# Patient Record
Sex: Female | Born: 1995 | Hispanic: Yes | Marital: Single | Smoking: Never smoker
Health system: Southern US, Community
[De-identification: ages and names within clinical notes are randomized; demographics above are authoritative.]

## PROBLEM LIST (undated history)

## (undated) ENCOUNTER — Inpatient Hospital Stay (HOSPITAL_COMMUNITY): Payer: Self-pay

## (undated) DIAGNOSIS — Z3490 Encounter for supervision of normal pregnancy, unspecified, unspecified trimester: Secondary | ICD-10-CM

## (undated) DIAGNOSIS — B009 Herpesviral infection, unspecified: Secondary | ICD-10-CM

## (undated) DIAGNOSIS — Z639 Problem related to primary support group, unspecified: Secondary | ICD-10-CM

## (undated) DIAGNOSIS — K219 Gastro-esophageal reflux disease without esophagitis: Secondary | ICD-10-CM

## (undated) DIAGNOSIS — J45909 Unspecified asthma, uncomplicated: Secondary | ICD-10-CM

## (undated) DIAGNOSIS — G4730 Sleep apnea, unspecified: Secondary | ICD-10-CM

## (undated) DIAGNOSIS — L309 Dermatitis, unspecified: Secondary | ICD-10-CM

## (undated) DIAGNOSIS — O99820 Streptococcus B carrier state complicating pregnancy: Secondary | ICD-10-CM

## (undated) DIAGNOSIS — G43909 Migraine, unspecified, not intractable, without status migrainosus: Secondary | ICD-10-CM

## (undated) DIAGNOSIS — R634 Abnormal weight loss: Secondary | ICD-10-CM

## (undated) DIAGNOSIS — E079 Disorder of thyroid, unspecified: Secondary | ICD-10-CM

## (undated) DIAGNOSIS — D649 Anemia, unspecified: Secondary | ICD-10-CM

## (undated) DIAGNOSIS — N920 Excessive and frequent menstruation with regular cycle: Secondary | ICD-10-CM

## (undated) DIAGNOSIS — F419 Anxiety disorder, unspecified: Secondary | ICD-10-CM

## (undated) DIAGNOSIS — R55 Syncope and collapse: Secondary | ICD-10-CM

## (undated) DIAGNOSIS — Z8619 Personal history of other infectious and parasitic diseases: Secondary | ICD-10-CM

## (undated) DIAGNOSIS — M545 Low back pain: Secondary | ICD-10-CM

## (undated) DIAGNOSIS — F1910 Other psychoactive substance abuse, uncomplicated: Secondary | ICD-10-CM

## (undated) DIAGNOSIS — F4323 Adjustment disorder with mixed anxiety and depressed mood: Secondary | ICD-10-CM

## (undated) HISTORY — DX: Anxiety disorder, unspecified: F41.9

## (undated) HISTORY — PX: TOOTH EXTRACTION: SUR596

---
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
## 2016-01-11 ENCOUNTER — Ambulatory Visit (INDEPENDENT_AMBULATORY_CARE_PROVIDER_SITE_OTHER): Payer: Medicaid Other | Admitting: Obstetrics & Gynecology

## 2016-01-11 ENCOUNTER — Encounter: Payer: Self-pay | Admitting: Obstetrics & Gynecology

## 2016-01-11 DIAGNOSIS — Z3009 Encounter for other general counseling and advice on contraception: Secondary | ICD-10-CM

## 2016-01-11 DIAGNOSIS — Z30011 Encounter for initial prescription of contraceptive pills: Secondary | ICD-10-CM

## 2016-01-11 MED ORDER — NORGESTIMATE-ETH ESTRADIOL 0.25-35 MG-MCG PO TABS: 1.0000 | ORAL_TABLET | Freq: Every day | ORAL | Status: DC

## 2016-01-11 NOTE — Patient Instructions (Signed)
Oral Contraception Use Oral contraceptive pills (OCPs) are medicines taken to prevent pregnancy. OCPs work by preventing the ovaries from releasing eggs. The hormones in OCPs also cause the cervical mucus to thicken, preventing the sperm from entering the uterus. The hormones also cause the uterine lining to become thin, not allowing a fertilized egg to attach to the inside of the uterus. OCPs are highly effective when taken exactly as prescribed. However, OCPs do not prevent sexually transmitted diseases (STDs). Safe sex practices, such as using condoms along with an OCP, can help prevent STDs. Before taking OCPs, you may have a physical exam and Pap test. Your health care provider may also order blood tests if necessary. Your health care provider will make sure you are a good candidate for oral contraception. Discuss with your health care provider the possible side effects of the OCP you may be prescribed. When starting an OCP, it can take 2 to 3 months for the body to adjust to the changes in hormone levels in your body.  HOW TO TAKE ORAL CONTRACEPTIVE PILLS Your health care provider may advise you on how to start taking the first cycle of OCPs. Otherwise, you can:   Start on day 1 of your menstrual period. You will not need any backup contraceptive protection with this start time.   Start on the first Sunday after your menstrual period or the day you get your prescription. In these cases, you will need to use backup contraceptive protection for the first week.   Start the pill at any time of your cycle. If you take the pill within 5 days of the start of your period, you are protected against pregnancy right away. In this case, you will not need a backup form of birth control. If you start at any other time of your menstrual cycle, you will need to use another form of birth control for 7 days. If your OCP is the type called a minipill, it will protect you from pregnancy after taking it for 2 days (48  hours). After you have started taking OCPs:   If you forget to take 1 pill, take it as soon as you remember. Take the next pill at the regular time.   If you miss 2 or more pills, call your health care provider because different pills have different instructions for missed doses. Use backup birth control until your next menstrual period starts.   If you use a 28-day pack that contains inactive pills and you miss 1 of the last 7 pills (pills with no hormones), it will not matter. Throw away the rest of the non-hormone pills and start a new pill pack.  No matter which day you start the OCP, you will always start a new pack on that same day of the week. Have an extra pack of OCPs and a backup contraceptive method available in case you miss some pills or lose your OCP pack.  HOME CARE INSTRUCTIONS   Do not smoke.   Always use a condom to protect against STDs. OCPs do not protect against STDs.   Use a calendar to mark your menstrual period days.   Read the information and directions that came with your OCP. Talk to your health care provider if you have questions.  SEEK MEDICAL CARE IF:   You develop nausea and vomiting.   You have abnormal vaginal discharge or bleeding.   You develop a rash.   You miss your menstrual period.   You are losing   your hair.   You need treatment for mood swings or depression.   You get dizzy when taking the OCP.   You develop acne from taking the OCP.   You become pregnant.  SEEK IMMEDIATE MEDICAL CARE IF:   You develop chest pain.   You develop shortness of breath.   You have an uncontrolled or severe headache.   You develop numbness or slurred speech.   You develop visual problems.   You develop pain, redness, and swelling in the legs.    This information is not intended to replace advice given to you by your health care provider. Make sure you discuss any questions you have with your health care provider.   Document  Released: 11/28/2011 Document Revised: 12/30/2014 Document Reviewed: 05/30/2013 Elsevier Interactive Patient Education 2016 [employer] Inc[employer]  

## 2016-01-11 NOTE — Progress Notes (Deleted)
Patient ID: Angela Branch, female   DOB: 13-Feb-1996, 20 y.o.   MRN: [id number]

## 2016-01-11 NOTE — Progress Notes (Signed)
Patient ID: Angela Branch, female   DOB: 01-12-1996, 20 y.o.   MRN: [id number] Subjective:     Angela Branch is a 20 y.o. female who presents for a postpartum visit. She is 6 weeks postpartum following a spontaneous vaginal delivery. I have fully reviewed the prenatal and intrapartum course. The delivery was at 40 4/7 gestational weeks. Outcome: spontaneous vaginal delivery. Anesthesia: epidural. Postpartum course has been uncomplicated. Baby's course has been uncomplicated. Baby is feeding by bottle. Bleeding no bleeding. Bowel function is normal. Bladder function is ok.  Pt reorts that it takes a while to begin her stream.. Patient is sexually active. Contraception method is none. Postpartum depression screening: negative.  The following portions of the patient's history were reviewed and updated as appropriate: allergies, current medications, past family history, past medical history, past social history, past surgical history and problem list.  Review of Systems Pertinent items are noted in HPI.   Objective:    BP 101/53 mmHg  Pulse 72  Temp(Src) 97.9 F (36.6 C)  Wt 128 lb 6.4 oz (58.242 kg)  Pt in NAD GU: EGBUS: no lesions; perineum well healed Vagina: no blood in vault Cervix: no lesion; no mucopurulent d/c Uterus: small, mobile Adnexa: no masses; sl tender     Assessment:     6 week postpartum exam. Pap smear not done at today's visit.   Plan:    1. Contraception: OCP (estrogen/progesterone). Sprintec 1 po q day 2. Pt told to be abstinate for 2 weeks and to do a UPT if neg to begin OCPs 3. Follow up in: 1 year or as needed.    Zykerria Tanton L. Harraway-Smith, M.D., Cherlynn June

## 2016-02-26 ENCOUNTER — Encounter (HOSPITAL_COMMUNITY): Payer: Self-pay | Admitting: Family Medicine

## 2016-02-26 ENCOUNTER — Emergency Department (HOSPITAL_COMMUNITY)
Admission: EM | Admit: 2016-02-26 | Discharge: 2016-02-26 | Disposition: A | Discharge status: Home / Self Care | Payer: Medicaid Other | Attending: Physician Assistant | Admitting: Physician Assistant

## 2016-02-26 DIAGNOSIS — Z793 Long term (current) use of hormonal contraceptives: Secondary | ICD-10-CM | POA: Insufficient documentation

## 2016-02-26 DIAGNOSIS — Z862 Personal history of diseases of the blood and blood-forming organs and certain disorders involving the immune mechanism: Secondary | ICD-10-CM | POA: Diagnosis not present

## 2016-02-26 DIAGNOSIS — Z79899 Other long term (current) drug therapy: Secondary | ICD-10-CM | POA: Insufficient documentation

## 2016-02-26 DIAGNOSIS — J45909 Unspecified asthma, uncomplicated: Secondary | ICD-10-CM | POA: Diagnosis not present

## 2016-02-26 DIAGNOSIS — Z8659 Personal history of other mental and behavioral disorders: Secondary | ICD-10-CM | POA: Diagnosis not present

## 2016-02-26 DIAGNOSIS — N390 Urinary tract infection, site not specified: Secondary | ICD-10-CM

## 2016-02-26 DIAGNOSIS — R319 Hematuria, unspecified: Secondary | ICD-10-CM | POA: Insufficient documentation

## 2016-02-26 DIAGNOSIS — N739 Female pelvic inflammatory disease, unspecified: Secondary | ICD-10-CM | POA: Diagnosis not present

## 2016-02-26 DIAGNOSIS — R3915 Urgency of urination: Secondary | ICD-10-CM | POA: Diagnosis present

## 2016-02-26 LAB — URINE MICROSCOPIC-ADD ON

## 2016-02-26 LAB — URINALYSIS, ROUTINE W REFLEX MICROSCOPIC
Bilirubin Urine: NEGATIVE
GLUCOSE, UA: NEGATIVE mg/dL
KETONES UR: NEGATIVE mg/dL
Nitrite: NEGATIVE
PH: 6 (ref 5.0–8.0)
Protein, ur: NEGATIVE mg/dL
Specific Gravity, Urine: 1.008 (ref 1.005–1.030)

## 2016-02-26 LAB — WET PREP, GENITAL
SPERM: NONE SEEN
TRICH WET PREP: NONE SEEN
Yeast Wet Prep HPF POC: NONE SEEN

## 2016-02-26 LAB — POC URINE PREG, ED: Preg Test, Ur: NEGATIVE

## 2016-02-26 MED ORDER — DOXYCYCLINE HYCLATE 100 MG PO CAPS: 100.0000 mg | ORAL_CAPSULE | Freq: Two times a day (BID) | ORAL | Status: DC

## 2016-02-26 MED ORDER — STERILE WATER FOR INJECTION IJ SOLN
10.0000 mL | Freq: Once | INTRAMUSCULAR | Status: AC
  Administered 2016-02-26: 10 mL via INTRAMUSCULAR
  Filled 2016-02-26: qty 10

## 2016-02-26 MED ORDER — CEFTRIAXONE SODIUM 250 MG IJ SOLR
250.0000 mg | Freq: Once | INTRAMUSCULAR | Status: AC
  Administered 2016-02-26: 250 mg via INTRAMUSCULAR
  Filled 2016-02-26: qty 250

## 2016-02-26 MED ORDER — METRONIDAZOLE 500 MG PO TABS: 500.0000 mg | ORAL_TABLET | Freq: Two times a day (BID) | ORAL | Status: DC

## 2016-02-26 NOTE — ED Notes (Signed)
Pt here for burning with urination. sts some lower abd pain. sts LMP 2 days ago.

## 2016-02-26 NOTE — Discharge Instructions (Signed)
Take medications as prescribed. Return to the emergency room for worsening condition or new concerning symptoms. Follow up with your regular doctor. If you don't have a regular doctor use one of the numbers below to establish a primary care doctor.   Emergency Department Resource Guide 1) Find a Doctor and Pay Out of Pocket Although you won't have to find out who is covered by your insurance plan, it is a good idea to ask around and get recommendations. You will then need to call the office and see if the doctor you have chosen will accept you as a new patient and what types of options they offer for patients who are self-pay. Some doctors offer discounts or will set up payment plans for their patients who do not have insurance, but you will need to ask so you aren't surprised when you get to your appointment.  2) Contact Your Local Health Department Not all health departments have doctors that can see patients for sick visits, but many do, so it is worth a call to see if yours does. If you don't know where your local health department is, you can check in your phone book. The CDC also has a tool to help you locate your state's health department, and many state websites also have listings of all of their local health departments.  3) Find a [facility] If your illness is not likely to be very severe or complicated, you may want to try a walk in clinic. These are popping up all over the country in pharmacies, drugstores, and shopping centers. They're usually staffed by nurse practitioners or physician assistants that have been trained to treat common illnesses and complaints. They're usually fairly quick and inexpensive. However, if you have serious medical issues or chronic medical problems, these are probably not your best option.  No Primary Care Doctor: - Call Health Connect at  [phone] - they can help you locate a primary care doctor that  accepts your insurance, provides certain services,  etc. - Physician Referral [phone]  Emergency Department Resource Guide 1) Find a Doctor and Pay Out of Pocket Although you won't have to find out who is covered by your insurance plan, it is a good idea to ask around and get recommendations. You will then need to call the office and see if the doctor you have chosen will accept you as a new patient and what types of options they offer for patients who are self-pay. Some doctors offer discounts or will set up payment plans for their patients who do not have insurance, but you will need to ask so you aren't surprised when you get to your appointment.  2) Contact Your Local Health Department Not all health departments have doctors that can see patients for sick visits, but many do, so it is worth a call to see if yours does. If you don't know where your local health department is, you can check in your phone book. The CDC also has a tool to help you locate your state's health department, and many state websites also have listings of all of their local health departments.  3) Find a [facility] If your illness is not likely to be very severe or complicated, you may want to try a walk in clinic. These are popping up all over the country in pharmacies, drugstores, and shopping centers. They're usually staffed by nurse practitioners or physician assistants that have been trained to treat common illnesses and complaints. They're usually fairly  quick and inexpensive. However, if you have serious medical issues or chronic medical problems, these are probably not your best option.  No Primary Care Doctor: - Call Health Connect at  [phone] - they can help you locate a primary care doctor that  accepts your insurance, provides certain services, etc. - Physician Referral Service- [phone]  Chronic Pain Problems: Organization         Address  Phone   Notes  [facility]  [phone] Patients need to be referred by  their primary care doctor.   Medication Assistance: Organization         Address  Phone   Notes  [facility] Medication [facility] [address] [place], [place] [address] [phone] --Must be a resident of [facility] -- Must have NO insurance coverage whatsoever (no Medicaid/ Medicare, etc.) -- The pt. MUST have a primary care doctor that directs their care regularly and follows them in the community   [facility]  [phone]   [employer]  [phone]    Agencies that provide inexpensive medical care: Organization         Address  Phone   Notes  [facility]  [phone]   [facility]    [phone]   [facility] [place], [place] [address] [phone]   [place] [address], [place] [phone]   [facility]    [phone]   [facility]    [phone]   [place] and [place] [address], [place] Phone:  [phone], Fax:  [phone] Hours of Operation:  9 am - 6 pm, M-F.  Also accepts Medicaid/Medicare and self-pay.  [facility] for [place] [address], [address], [place] Phone: [phone], Fax: [phone]. Hours of Operation:  8:30 am - 5:30 pm, M-F.  Also accepts Medicaid and self-pay.  [facility] [address] [place] Phone: [phone]   [place], [place], [place] [phone], Ext. [phone] Mondays & Thursdays: 7-9 AM.  First 15 patients are seen on a first come, first serve basis.    [place] Providers:  Organization         Address  Phone   Notes  [facility] [address],  [phone] Also accepts self-pay patients.  [facility] [address], [place]  [phone]   [place], [address], [place] [phone]   [facility] [address] [place] [phone]   Lucianne Lei [address], [place]   [phone] Only accepts [place] patients after they have their name applied to their card.   Self-Pay (no insurance) in [facility]:  Organization         Address  Phone   Notes  Sickle Cell Patients, [facility] [place] [phone]   [facility] [place] [phone]   [facility] [place]  [address], [address], [place] [phone]   [facility]/Dr. Osei-Bonsu  [address], [place] or [place], [address], [place] [phone] Phone number  for both [employer] and [place] locations is the same.  Urgent Medical and [facility] [address], [place] [phone]   [facility] [address] [place] or [address] [phone]   [facility] [address], [place] [phone], phone; [phone], fax Sees patients 1st and 3rd Saturday of every month.  Must not qualify for public or private insurance (i.e. Medicaid, Medicare, [place] [employer] Choice, Veterans' Benefits)  Household income should be no more than 200% of the poverty level The clinic cannot treat you if you are pregnant or think you are pregnant  Sexually transmitted diseases are not treated at the clinic.     Pelvic Inflammatory Disease Pelvic inflammatory disease (PID) refers to an infection in some or all of the female organs. The infection can be in the uterus, ovaries, fallopian tubes, or the surrounding tissues in the pelvis. PID can cause abdominal or pelvic pain that comes on suddenly (acute pelvic pain). PID is a serious infection because it can lead to lasting (chronic) pelvic pain or the inability  to have children (infertility). CAUSES This condition is most often caused by an infection that is spread during sexual contact. However, the infection can also be caused by the normal bacteria that are found in the vaginal tissues if these bacteria travel upward into the reproductive organs. PID can also occur following:  The birth of a baby.  A miscarriage.  An abortion.  Major pelvic surgery.  The use of an intrauterine device (IUD).  A sexual assault. RISK FACTORS This condition is more likely to develop in women who:  Are younger than 19 years of age.  Are sexually active at [facility] age.  Use nonbarrier contraception.  Have multiple sexual partners.  Have sex with someone who has symptoms of an STD (sexually transmitted disease).  Use oral contraception. At times, certain behaviors can also increase the possibility of getting PID, such as:  Using a vaginal douche.  Having an IUD in place. SYMPTOMS Symptoms of this condition include:  Abdominal or pelvic pain.  Fever.  Chills.  Abnormal vaginal discharge.  Abnormal uterine bleeding.  Unusual pain shortly after the end of a menstrual period.  Painful urination.  Pain with sexual intercourse.  Nausea and vomiting. DIAGNOSIS To diagnose this condition, your health care provider will do a physical exam and take your medical history. A pelvic exam typically reveals great tenderness in the uterus and the surrounding pelvic tissues. You may also have tests, such as:  Lab tests, including a pregnancy test, blood tests, and urine test.  Culture tests of the vagina and cervix to check for an STD.  Ultrasound.  A laparoscopic procedure to look inside the pelvis.  Examining vaginal secretions under a microscope. TREATMENT Treatment for this condition may involve one or more approaches.  Antibiotic medicines may be prescribed to be taken by mouth.  Sexual partners may need to be treated if the infection  is caused by an STD.  For more severe cases, hospitalization may be needed to give antibiotics directly into a vein through an IV tube.  Surgery may be needed if other treatments do not help, but this is rare. It may take weeks until you are completely well. If you are diagnosed with PID, you should also be checked for human immunodeficiency virus (HIV). Your health care provider may test you for infection again 3 months after treatment. You should not have unprotected sex.  HOME CARE INSTRUCTIONS  Take over-the-counter and prescription medicines only as told by your health care provider.  If you were prescribed an antibiotic medicine, take it as told by your health care provider. Do not stop taking the antibiotic even if you start to feel better.  Do not have sexual intercourse until treatment is completed or as told by your health care provider. If PID is confirmed, your recent sexual partners will need treatment, especially if you had unprotected sex.  Keep all follow-up visits as told by your health care provider. This is important. SEEK MEDICAL CARE IF:  You have increased or abnormal vaginal discharge.  Your pain does not improve.  You vomit.  You have a fever.  You cannot tolerate your medicines.  Your partner has an STD.  You have pain when you urinate. SEEK IMMEDIATE MEDICAL CARE IF:  You have increased abdominal or pelvic pain.  You have chills.  Your symptoms are not better in 72 hours even with treatment.   This information is not intended to replace advice given to you by your health care provider. Make sure you discuss any questions you have with your health care provider.   Document Released: 12/09/2005 Document Revised: 08/30/2015 Document Reviewed: 01/16/2015 Elsevier Interactive Patient Education [employer].

## 2016-02-26 NOTE — ED Provider Notes (Signed)
CSN: [id number]     Arrival date & time 02/26/16  1544 History  By signing my name below, I, Dora Sims, attest that this documentation has been prepared under the direction and in the presence of non-physician practitioner, Delrae Rend, PA-C. Electronically Signed: Dora Sims, Scribe. 02/26/2016. 5:01 PM.    Chief Complaint  Patient presents with  . Urinary Urgency    The history is provided by the patient. No language interpreter was used.     HPI Comments: Angela Branch is a 20 y.o. female with h/o UTI who presents to the Emergency Department complaining of constant dysuria onset a couple of days ago. She reports experiencing dysuria since she had her last period two days ago; her most recent period is her first since giving birth three months ago. Pt endorses associated lower abdominal pain and urinary hesitancy since onset. She also notes experiencing yellow vaginal discharge after urinating for the past couple of days; she notes that she has never had yellow vaginal discharge in the past. She denies increased urinary frequency, urgency, nausea, vomiting, diarrhea, fever, chills, or any other associated symptoms.    Past Medical History  Diagnosis Date  . Asthma   . Anxiety   . Anemia    Past Surgical History  Procedure Laterality Date  . Tooth extraction     Family History  Problem Relation Age of Onset  . Asthma Father   . Diabetes Maternal Grandmother   . Hyperlipidemia Paternal Grandfather   . Heart disease Paternal Grandfather   . Mental illness Neg Hx   . Birth defects Neg Hx   . Kidney disease Neg Hx   . Hypertension Neg Hx   . Cancer - Colon Maternal Grandfather    Social History  Substance Use Topics  . Smoking status: Never Smoker   . Smokeless tobacco: Never Used  . Alcohol Use: No   OB History    Gravida Para Term Preterm AB TAB SAB Ectopic Multiple Living   '2 1 1 '$ 0 1 0 1 0 0 1     Review of Systems  Constitutional: Negative for fever and  chills.  Gastrointestinal: Positive for abdominal pain. Negative for nausea, vomiting and diarrhea.  Genitourinary: Positive for dysuria and vaginal discharge. Negative for urgency and frequency.  All other systems reviewed and are negative.     Allergies  Review of patient's allergies indicates no known allergies.  Home Medications   Prior to Admission medications   Medication Sig Start Date End Date Taking? Authorizing Provider  albuterol (PROVENTIL HFA;VENTOLIN HFA) 108 (90 BASE) MCG/ACT inhaler Inhale 2 puffs into the lungs every 6 (six) hours as needed for wheezing or shortness of breath. 07/20/15   Myrtis Ser, CNM  ibuprofen (ADVIL,MOTRIN) 600 MG tablet Take 1 tablet (600 mg total) by mouth every 6 (six) hours as needed. 12/02/15   Myrtis Ser, CNM  norgestimate-ethinyl estradiol (ORTHO-CYCLEN,SPRINTEC,PREVIFEM) 0.25-35 MG-MCG tablet Take 1 tablet by mouth daily. 01/11/16   Lavonia Drafts, MD  permethrin (ELIMITE) 5 % cream Apply 1 application topically once. Reported on 01/11/2016 11/08/15   Historical Provider, MD  Prenatal Vit-Fe Fumarate-FA (PRENATAL MULTIVITAMIN) TABS tablet Take 1 tablet by mouth daily at 12 noon.    Historical Provider, MD   BP 97/62 mmHg  Pulse 72  Temp(Src) 97.9 F (36.6 C) (Oral)  Resp 18  SpO2 100%  LMP 02/23/2016 Physical Exam  Constitutional: She is oriented to person, place, and time. She appears well-developed and well-nourished.  No distress.  HENT:  Head: Normocephalic and atraumatic.  Eyes: Conjunctivae and EOM are normal.  Neck: Neck supple. No tracheal deviation present.  Cardiovascular: Normal rate.   Pulmonary/Chest: Effort normal. No respiratory distress.  Abdominal: Soft. Bowel sounds are normal. She exhibits no distension. There is no tenderness.  Genitourinary:  Copious yellow-white smooth discharge in vault. +CMT. No adnexal tenderness. Tenderness along left side of vaginal canal. No lesions or bleeding.   Musculoskeletal: Normal range of motion.  Neurological: She is alert and oriented to person, place, and time.  Skin: Skin is warm and dry.  Psychiatric: She has a normal mood and affect. Her behavior is normal.  Nursing note and vitals reviewed.   ED Course  Procedures (including critical care time)  DIAGNOSTIC STUDIES: Oxygen Saturation is 100% on RA, normal by my interpretation.    COORDINATION OF CARE: 5:01 PM Will order HIV antibody, RPR, and genital wet prep. Discussed treatment plan with pt at bedside and pt agreed to plan.   Labs Review Labs Reviewed  WET PREP, GENITAL - Abnormal; Notable for the following:    Clue Cells Wet Prep HPF POC PRESENT (*)    WBC, Wet Prep HPF POC MANY (*)    All other components within normal limits  URINALYSIS, ROUTINE W REFLEX MICROSCOPIC (NOT AT [facility]) - Abnormal; Notable for the following:    APPearance HAZY (*)    Hgb urine dipstick TRACE (*)    Leukocytes, UA LARGE (*)    All other components within normal limits  URINE MICROSCOPIC-ADD ON - Abnormal; Notable for the following:    Squamous Epithelial / LPF 0-5 (*)    Bacteria, UA FEW (*)    All other components within normal limits  URINE CULTURE  HIV ANTIBODY (ROUTINE TESTING)  RPR  POC URINE PREG, ED  GC/CHLAMYDIA PROBE AMP () NOT AT [facility]    Imaging Review No results found. I have personally reviewed and evaluated these lab results as part of my medical decision-making.   EKG Interpretation None      MDM   Final diagnoses:  PID (pelvic inflammatory disease)  Urinary tract infection with hematuria, site unspecified    Given discharge, cervical motion tenderness, and wet prep with WBC and clue cells, will treat as PID. Rocephin given in the ED. Rx given for doxy and flagyl. UA shows evidence of infection as well. Sent for culture. Encouraged safe sex practices. Encouraged abstinence until tx completed and informing all sexual partners to be treated/tested as  well. Resource guide given to establish PCP for f/u. ER return precautions given.  I personally performed the services described in this documentation, which was scribed in my presence. The recorded information has been reviewed and is accurate.     Anne Ng, PA-C 02/26/16 2015  [place], MD 02/27/16 1623

## 2016-02-27 LAB — GC/CHLAMYDIA PROBE AMP (~~LOC~~) NOT AT ARMC
Chlamydia: NEGATIVE
Neisseria Gonorrhea: NEGATIVE

## 2016-02-27 LAB — HIV ANTIBODY (ROUTINE TESTING W REFLEX): HIV Screen 4th Generation wRfx: NONREACTIVE

## 2016-02-27 LAB — RPR: RPR Ser Ql: NONREACTIVE

## 2016-02-28 LAB — URINE CULTURE

## 2016-04-11 ENCOUNTER — Other Ambulatory Visit: Payer: Self-pay | Admitting: Pediatrics

## 2016-04-11 MED ORDER — ALBUTEROL SULFATE HFA 108 (90 BASE) MCG/ACT IN AERS: 2.0000 | INHALATION_SPRAY | Freq: Four times a day (QID) | RESPIRATORY_TRACT | Status: DC | PRN

## 2016-04-29 ENCOUNTER — Encounter: Payer: Self-pay | Admitting: Pediatrics

## 2016-04-29 ENCOUNTER — Ambulatory Visit (INDEPENDENT_AMBULATORY_CARE_PROVIDER_SITE_OTHER): Payer: Medicaid Other | Admitting: Pediatrics

## 2016-04-29 VITALS — BP 103/71 | HR 80 | Ht 63.39 in | Wt 125.0 lb

## 2016-04-29 DIAGNOSIS — Z113 Encounter for screening for infections with a predominantly sexual mode of transmission: Secondary | ICD-10-CM | POA: Diagnosis not present

## 2016-04-29 DIAGNOSIS — Z7251 High risk heterosexual behavior: Secondary | ICD-10-CM | POA: Diagnosis not present

## 2016-04-29 DIAGNOSIS — F4323 Adjustment disorder with mixed anxiety and depressed mood: Secondary | ICD-10-CM

## 2016-04-29 DIAGNOSIS — Z3045 Encounter for surveillance of transdermal patch hormonal contraceptive device: Secondary | ICD-10-CM | POA: Insufficient documentation

## 2016-04-29 DIAGNOSIS — N760 Acute vaginitis: Secondary | ICD-10-CM | POA: Diagnosis not present

## 2016-04-29 DIAGNOSIS — A499 Bacterial infection, unspecified: Secondary | ICD-10-CM

## 2016-04-29 DIAGNOSIS — Z3202 Encounter for pregnancy test, result negative: Secondary | ICD-10-CM | POA: Diagnosis not present

## 2016-04-29 DIAGNOSIS — N898 Other specified noninflammatory disorders of vagina: Secondary | ICD-10-CM

## 2016-04-29 DIAGNOSIS — B9689 Other specified bacterial agents as the cause of diseases classified elsewhere: Secondary | ICD-10-CM

## 2016-04-29 HISTORY — DX: Adjustment disorder with mixed anxiety and depressed mood: F43.23

## 2016-04-29 MED ORDER — ULIPRISTAL ACETATE 30 MG PO TABS
30.0000 mg | ORAL_TABLET | Freq: Once | ORAL | Status: AC
  Administered 2016-04-29: 30 mg via ORAL

## 2016-04-29 MED ORDER — NORELGESTROMIN-ETH ESTRADIOL 150-35 MCG/24HR TD PTWK: 1.0000 | MEDICATED_PATCH | TRANSDERMAL | Status: DC

## 2016-04-29 MED ORDER — FLUOXETINE HCL 20 MG PO CAPS: 20.0000 mg | ORAL_CAPSULE | Freq: Every day | ORAL | Status: DC

## 2016-04-29 NOTE — Progress Notes (Signed)
Pre-Visit Planning  Angela Branch  is a 20 y.o. female referred by [name], MD.   Last seen in [facility] on 03/28/15 for positive urine pregnancy test.   Plan at last visit included refer to OB. Patient since had a vaginal delivery and is 4 months postpartum. She was started on OCPs at 6 weeks PP and treated for PID 02/26/16 although testing was negative.  Date and Type of Previous Psych Screenings? No  Clinical Staff Visit Tasks:   - Urine GC/CT due? yes - HIV Screening due?  no - Psych Screenings Due? No - dirty urine for gc/chlamydia - urine pregnancy   Provider Visit Tasks: - discuss current concerns and needs  - discuss ongoing contraceptive management  - [facility] Involvement? No - Pertinent Labs? Yes  Results for orders placed or performed during the hospital encounter of 02/26/16  Wet prep, genital  Result Value Ref Range   Yeast Wet Prep HPF POC NONE SEEN NONE SEEN   Trich, Wet Prep NONE SEEN NONE SEEN   Clue Cells Wet Prep HPF POC PRESENT (A) NONE SEEN   WBC, Wet Prep HPF POC MANY (A) NONE SEEN   Sperm NONE SEEN   Urine culture  Result Value Ref Range   Specimen Description URINE, RANDOM    Special Requests ADDED 2159    Culture MULTIPLE SPECIES PRESENT, SUGGEST RECOLLECTION    Report Status 02/28/2016 FINAL   Urinalysis, Routine w reflex microscopic-may I&O cath if menses (not at [facility])  Result Value Ref Range   Color, Urine YELLOW YELLOW   APPearance HAZY (A) CLEAR   Specific Gravity, Urine 1.008 1.005 - 1.030   pH 6.0 5.0 - 8.0   Glucose, UA NEGATIVE NEGATIVE mg/dL   Hgb urine dipstick TRACE (A) NEGATIVE   Bilirubin Urine NEGATIVE NEGATIVE   Ketones, ur NEGATIVE NEGATIVE mg/dL   Protein, ur NEGATIVE NEGATIVE mg/dL   Nitrite NEGATIVE NEGATIVE   Leukocytes, UA LARGE (A) NEGATIVE  HIV antibody  Result Value Ref Range   HIV Screen 4th Generation wRfx Non Reactive Non Reactive  RPR  Result Value Ref Range   RPR Ser Ql Non Reactive Non  Reactive  Urine microscopic-add on  Result Value Ref Range   Squamous Epithelial / LPF 0-5 (A) NONE SEEN   WBC, UA 6-30 0 - 5 WBC/hpf   RBC / HPF 0-5 0 - 5 RBC/hpf   Bacteria, UA FEW (A) NONE SEEN  POC urine preg, ED (not at [facility])  Result Value Ref Range   Preg Test, Ur NEGATIVE NEGATIVE  GC/Chlamydia probe amp ([place])not at [facility]  Result Value Ref Range   Chlamydia Negative    Neisseria gonorrhea Negative      >5 minutes spent reviewing records and planning for patient's visit.

## 2016-04-29 NOTE — Progress Notes (Signed)
THIS RECORD MAY CONTAIN CONFIDENTIAL INFORMATION THAT SHOULD NOT BE RELEASED WITHOUT REVIEW OF THE SERVICE PROVIDER.  Adolescent Medicine Consultation Follow-Up Visit Angela Branch  is a 20 y.o. female referred by [name], MD here today for follow-up.    Previsit planning completed:  Yes   Expand All Collapse All   Pre-Visit Planning  Angela Branch is a 20 y.o. female referred by [name], MD.  Last seen in [facility] on 03/28/15 for positive urine pregnancy test.   Plan at last visit included refer to OB. Patient since had a vaginal delivery and is 4 months postpartum. She was started on OCPs at 6 weeks PP and treated for PID 02/26/16 although testing was negative.  Date and Type of Previous Psych Screenings? No  Clinical Staff Visit Tasks:  - Urine GC/CT due? yes - HIV Screening due? no - Psych Screenings Due? No - dirty urine for gc/chlamydia - urine pregnancy   Provider Visit Tasks: - discuss current concerns and needs  - discuss ongoing contraceptive management  - [facility] Involvement? No - Pertinent Labs? Yes  Results for orders placed or performed during the hospital encounter of 02/26/16  Wet prep, genital  Result Value Ref Range   Yeast Wet Prep HPF POC NONE SEEN NONE SEEN   Trich, Wet Prep NONE SEEN NONE SEEN   Clue Cells Wet Prep HPF POC PRESENT (A) NONE SEEN   WBC, Wet Prep HPF POC MANY (A) NONE SEEN   Sperm NONE SEEN   Urine culture  Result Value Ref Range   Specimen Description URINE, RANDOM    Special Requests ADDED 2159    Culture MULTIPLE SPECIES PRESENT, SUGGEST RECOLLECTION    Report Status 02/28/2016 FINAL   Urinalysis, Routine w reflex microscopic-may I&O cath if menses (not at [facility])  Result Value Ref Range   Color, Urine YELLOW YELLOW   APPearance HAZY (A) CLEAR   Specific Gravity, Urine 1.008 1.005 - 1.030   pH 6.0 5.0 - 8.0   Glucose,  UA NEGATIVE NEGATIVE mg/dL   Hgb urine dipstick TRACE (A) NEGATIVE   Bilirubin Urine NEGATIVE NEGATIVE   Ketones, ur NEGATIVE NEGATIVE mg/dL   Protein, ur NEGATIVE NEGATIVE mg/dL   Nitrite NEGATIVE NEGATIVE   Leukocytes, UA LARGE (A) NEGATIVE  HIV antibody  Result Value Ref Range   HIV Screen 4th Generation wRfx Non Reactive Non Reactive  RPR  Result Value Ref Range   RPR Ser Ql Non Reactive Non Reactive  Urine microscopic-add on  Result Value Ref Range   Squamous Epithelial / LPF 0-5 (A) NONE SEEN   WBC, UA 6-30 0 - 5 WBC/hpf   RBC / HPF 0-5 0 - 5 RBC/hpf   Bacteria, UA FEW (A) NONE SEEN  POC urine preg, ED (not at [facility])  Result Value Ref Range   Preg Test, Ur NEGATIVE NEGATIVE  GC/Chlamydia probe amp ([place])not at [facility]  Result Value Ref Range   Chlamydia Negative    Neisseria gonorrhea Negative      >5 minutes spent reviewing records and planning for patient's visit.         Growth Chart Viewed? yes   History was provided by the patient.  PCP Confirmed?  yes  My Chart Activated?   yes   HPI:    She didn't have STI at the ED visit in March. She does not always have the same partner. She did not finish treatment for PID given that she ended up  testing negative. She had vomiting with taking oral meds.  Had bleeding after sex once but this stopped. She had a bad odor at one point but seems to improve. Spotting lasted a day or two. Hasn't had a period yet this month but she tracks them on her phone. She usually has 6 day cycles. They aren't always regular. Last had one toward the end of April.  She is not currently using contraception. She uses condoms but not always. She has used plan B once. She believes that birth control is "bad for you." She used depo and had bleeding for several months when she stopped. She also got pregnant on depo and had a miscarriage. She also had some  sickness.  OCPs her cramping was quite bad. She took it quite regularly. She has considered the patch.   Needs to see somebody for anxiety. She feels very jittery, has difficulty concentration and biting nails and cuticles. This has been ongoing before the baby was born. She has self medicated with MJ in the past so she is trying to do better. Has never taken anxiety medications. She is having significant difficulty sleeping despite baby sleeping fairly well. Has been in therapy in the past but she didn't feel like it was her thing. She doesn't normally like taking medicines but feels like this is bad enough to consider. Mom has anxiety and depression as does dad. Mom is on Xanax but not sure what else. She has taken xanax of her mother's a few times which helps briefly similar to the MJ. She last cut herself 1 month ago on the wrist. She sometimes feels like she might be better off not here if she is not in a good mood but has no plan and her baby is her hope for the future. She is currently working at a [employer] and occasionally as an Engineer, maintenance (IT).   PHQ-SADS 04/29/2016  PHQ-15 20  GAD-7 21  PHQ-9 27  Suicidal Ideation No  Comment Extremely difficult. Self harm behaviors but no suicidal plan     Review of Systems  Constitutional: Positive for malaise/fatigue. Negative for weight loss.  Eyes: Negative for blurred vision.  Respiratory: Negative for shortness of breath.   Cardiovascular: Negative for chest pain and palpitations.  Gastrointestinal: Positive for abdominal pain. Negative for nausea, vomiting and constipation.  Genitourinary: Negative for dysuria.  Musculoskeletal: Positive for back pain. Negative for myalgias.  Neurological: Negative for dizziness and headaches.  Psychiatric/Behavioral: Negative for depression. The patient has insomnia.      Patient's last menstrual period was 03/25/2016 (exact date). No Known Allergies Outpatient Prescriptions Prior to Visit  Medication  Sig Dispense Refill  . albuterol (PROVENTIL HFA;VENTOLIN HFA) 108 (90 BASE) MCG/ACT inhaler Inhale 2 puffs into the lungs every 4 (four) hours as needed for wheezing. 1 Inhaler 0  . albuterol (PROVENTIL HFA;VENTOLIN HFA) 108 (90 Base) MCG/ACT inhaler Inhale 2 puffs into the lungs every 6 (six) hours as needed for wheezing or shortness of breath. 1 Inhaler 12  . doxycycline (VIBRAMYCIN) 100 MG capsule Take 1 capsule (100 mg total) by mouth 2 (two) times daily. 28 capsule 0  . ibuprofen (ADVIL,MOTRIN) 600 MG tablet Take 1 tablet (600 mg total) by mouth every 6 (six) hours as needed. 50 tablet 0  . metroNIDAZOLE (FLAGYL) 500 MG tablet Take 1 tablet (500 mg total) by mouth 2 (two) times daily. 28 tablet 0  . norgestimate-ethinyl estradiol (ORTHO-CYCLEN,SPRINTEC,PREVIFEM) 0.25-35 MG-MCG tablet Take 1 tablet by mouth daily.  1 Package 11  . permethrin (ELIMITE) 5 % cream Apply 1 application topically once. Reported on 01/11/2016  1  . Prenatal Vit-Fe Fumarate-FA (PRENATAL MULTIVITAMIN) TABS tablet Take 1 tablet by mouth daily at 12 noon.     No facility-administered medications prior to visit.     Patient Active Problem List   Diagnosis Date Noted  . Active labor at term 11/30/2015  . GBS (group B Streptococcus carrier), +RV culture, currently pregnant 11/14/2015  . H/O scabies 11/14/2015  . Faintness 10/05/2015  . GERD (gastroesophageal reflux disease) 09/21/2015  . Encounter for supervision of normal pregnancy in teen primigravida, antepartum 07/17/2015  . Insufficient prenatal care in second trimester 07/17/2015  . Nausea and vomiting during pregnancy prior to [redacted] weeks gestation 07/17/2015  . Substance abuse 03/24/2015  . Eczema 10/04/2014  . Asthma 06/12/2012    The following portions of the patient's history were reviewed and updated as appropriate: allergies, current medications, past family history, past medical history, past social history and problem list.  Physical Exam:  Filed  Vitals:   04/29/16 1533  BP: 103/71  Pulse: 80  Height: 5' 3.39" (1.61 m)  Weight: 125 lb (56.7 kg)   BP 103/71 mmHg  Pulse 80  Ht 5' 3.39" (1.61 m)  Wt 125 lb (56.7 kg)  BMI 21.87 kg/m2  LMP 03/25/2016 (Exact Date)  Breastfeeding? No Body mass index: body mass index is 21.87 kg/(m^2). Blood pressure percentiles are 24% systolic and 71% diastolic based on 2000 NHANES data. Blood pressure percentile targets: 90: 124/79, 95: 128/83, 99 + 5 mmHg: 140/96.  Physical Exam  Constitutional: She is oriented to person, place, and time. She appears well-developed and well-nourished.  HENT:  Head: Normocephalic.  Neck: No thyromegaly present.  Cardiovascular: Normal rate, regular rhythm, normal heart sounds and intact distal pulses.   Pulmonary/Chest: Effort normal and breath sounds normal.  Abdominal: Soft. Bowel sounds are normal. There is no tenderness.  Musculoskeletal: Normal range of motion.  Neurological: She is alert and oriented to person, place, and time.  Skin: Skin is warm and dry.  Psychiatric: She has a normal mood and affect.    Assessment/Plan: 1. Encounter for surveillance of transdermal contraceptive Patient initially not willing to start any birth control but felt like patch was a good option for her. Will start today given that she started period while she was in the office.  - norelgestromin-ethinyl estradiol (ORTHO EVRA) 150-35 MCG/24HR transdermal patch; Place 1 patch onto the skin once a week.  Dispense: 3 patch; Refill: 12  2. Adjustment disorder with mixed anxiety and depressed mood Has significant anxiety and depression symptoms, likely some related to having a new baby but also beginning much before that. Past counseling but didn't like it. Declines BHC today. We discussed self medicating with substances and working to control anxiety with long term medication. She is safe to herself today.  - FLUoxetine (PROZAC) 20 MG capsule; Take 1 capsule (20 mg total) by  mouth daily.  Dispense: 30 capsule; Refill: 1  3. Vaginal discharge Given bleeding after intercourse and discharge will swab and send for STI as well. Positive for BV- will treat with metrogel given intolerance of flagyl previously.  - WET PREP BY MOLECULAR PROBE  4. Unprotected sexual intercourse Samson Frederic provided today in clinic along with patch start.  - ulipristal acetate (ELLA) tablet 30 mg; Take 1 tablet (30 mg total) by mouth once.  5. Routine screening for STI (sexually transmitted infection) chalmydia positive. Notified by  my chart and by nurse. Awaiting to see if patient wants to be treated in clinic or via pharmacy. Needs to treat all partners since March and abstain from sex for 7 days after all parties have completed treatment.  - GC/Chlamydia Probe Amp - HIV antibody - RPR  6. Pregnancy examination or test, negative result Negative.  - POCT urine pregnancy   Follow-up:  2 weeks for prozac follow-up   Medical decision-making:  > 40 minutes spent, more than 50% of appointment was spent discussing diagnosis and management of symptoms

## 2016-04-29 NOTE — Patient Instructions (Addendum)
The patch is simple to use. The only tricky part is remembering the schedule for putting the patch on and taking it off-and we can help you with that.  You can put the patch on your butt, stomach, upper outer arm, or upper torso-never on your boobs, though. Just stick a single, new patch on once a week for three weeks in a row, then go patchless (no patch) for the fourth week.  For example, let's say it's Tuesday and you put on a new patch. Tuesday becomes your "patch change day." In other words, patches will always go on (or off) on Tuesdays.  You'll probably get your period during the patchless week, and you may still be bleeding when it's time to put the patch back on. That's totally normal. Put it on anyway.  Check out these tips and tricks to make the whole thing easier.  Tip 1 If you start the patch within the first 5 days of your period, you're protected from pregnancy right away. If you start later, you'll have to wait 7 days before you're protected, and you'll need to use a backup method.  Tip 2 Think carefully about where you want to stick the patch-it'll be there for a full week. Like, what will you be wearing? How squishy is your flesh in each spot? (If you've got a bit of a tummy that makes folds, for example, the stomach may not be the spot for you.)  Tip 3 Only peel off half of the clear plastic at first, so you'll have a non-sticky side to hold on to.  Tip 4 Don't touch the sticky part of the patch with your fingers. It's a beeyotch to unstick.  Tip 5 Press the patch down for a full 10 seconds to get a good, firm stick.  Tip 6 Don't use body lotion, oil, powder, creamy soaps (like Dove or Caress) or makeup on the spot where you put your patch. Stuff like that can keep the patch from sticking.  Tip 7 Check your patch every day to make sure it's sticking right.  Tip 8 Fuzz happens. You'll probably get a bit of lint build-up around the edges, so plan accordingly. You can  use baby oil to get any remaining adhesive off your skin.  Tip 9 When you take a patch off, fold it in half before you throw it in the trash. That'll help keep hormones out the soil. And don't flush 'em! The earth will thank you.   Fluoxetine capsules or tablets (Depression/Mood Disorders) What is this medicine? FLUOXETINE (floo OX e teen) belongs to a class of drugs known as selective serotonin reuptake inhibitors (SSRIs). It helps to treat mood problems such as depression, obsessive compulsive disorder, and panic attacks. It can also treat certain eating disorders. This medicine may be used for other purposes; ask your health care provider or pharmacist if you have questions. What should I tell my health care provider before I take this medicine? They need to know if you have any of these conditions: -bipolar disorder or mania -diabetes -glaucoma -liver disease -psychosis -seizures -suicidal thoughts or history of attempted suicide -an unusual or allergic reaction to fluoxetine, other medicines, foods, dyes, or preservatives -pregnant or trying to get pregnant -breast-feeding How should I use this medicine? Take this medicine by mouth with a glass of water. Follow the directions on the prescription label. You can take this medicine with or without food. Take your medicine at regular intervals. Do not take it  more often than directed. Do not stop taking this medicine suddenly except upon the advice of your doctor. Stopping this medicine too quickly may cause serious side effects or your condition may worsen. A special MedGuide will be given to you by the pharmacist with each prescription and refill. Be sure to read this information carefully each time. Talk to your pediatrician regarding the use of this medicine in children. While this drug may be prescribed for children as young as 7 years for selected conditions, precautions do apply. Overdosage: If you think you have taken too much of  this medicine contact a poison control center or emergency room at once. NOTE: This medicine is only for you. Do not share this medicine with others. What if I miss a dose? If you miss a dose, skip the missed dose and go back to your regular dosing schedule. Do not take double or extra doses. What may interact with this medicine? Do not take fluoxetine with any of the following medications: -other medicines containing fluoxetine, like Sarafem or Symbyax -cisapride -linezolid -MAOIs like Carbex, Eldepryl, Marplan, Nardil, and Parnate -methylene blue (injected into a vein) -pimozide -thioridazine This medicine may also interact with the following medications: -alcohol -aspirin and aspirin-like medicines -carbamazepine -certain medicines for depression, anxiety, or psychotic disturbances -certain medicines for migraine headaches like almotriptan, eletriptan, frovatriptan, naratriptan, rizatriptan, sumatriptan, zolmitriptan -digoxin -diuretics -fentanyl -flecainide -furazolidone -isoniazid -lithium -medicines for sleep -medicines that treat or prevent blood clots like warfarin, enoxaparin, and dalteparin -NSAIDs, medicines for pain and inflammation, like ibuprofen or naproxen -phenytoin -procarbazine -propafenone -rasagiline -ritonavir -supplements like St. John's wort, kava kava, valerian -tramadol -tryptophan -vinblastine This list may not describe all possible interactions. Give your health care provider a list of all the medicines, herbs, non-prescription drugs, or dietary supplements you use. Also tell them if you smoke, drink alcohol, or use illegal drugs. Some items may interact with your medicine. What should I watch for while using this medicine? Tell your doctor if your symptoms do not get better or if they get worse. Visit your doctor or health care professional for regular checks on your progress. Because it may take several weeks to see the full effects of this  medicine, it is important to continue your treatment as prescribed by your doctor. Patients and their families should watch out for new or worsening thoughts of suicide or depression. Also watch out for sudden changes in feelings such as feeling anxious, agitated, panicky, irritable, hostile, aggressive, impulsive, severely restless, overly excited and hyperactive, or not being able to sleep. If this happens, especially at the beginning of treatment or after a change in dose, call your health care professional. Dennis Bast may get drowsy or dizzy. Do not drive, use machinery, or do anything that needs mental alertness until you know how this medicine affects you. Do not stand or sit up quickly, especially if you are an older patient. This reduces the risk of dizzy or fainting spells. Alcohol may interfere with the effect of this medicine. Avoid alcoholic drinks. Your mouth may get dry. Chewing sugarless gum or sucking hard candy, and drinking plenty of water may help. Contact your doctor if the problem does not go away or is severe. This medicine may affect blood sugar levels. If you have diabetes, check with your doctor or health care professional before you change your diet or the dose of your diabetic medicine. What side effects may I notice from receiving this medicine? Side effects that you should report to your  doctor or health care professional as soon as possible: -allergic reactions like skin rash, itching or hives, swelling of the face, lips, or tongue -breathing problems -confusion -eye pain, changes in vision -fast or irregular heart rate, palpitations -flu-like fever, chills, cough, muscle or joint aches and pains -seizures -suicidal thoughts or other mood changes -swelling or redness in or around the eye -tremors -trouble sleeping -unusual bleeding or bruising -unusually tired or weak -vomiting Side effects that usually do not require medical attention (report to your doctor or health care  professional if they continue or are bothersome): -change in sex drive or performance -diarrhea -dry mouth -flushing -headache -increased or decreased appetite -nausea -sweating This list may not describe all possible side effects. Call your doctor for medical advice about side effects. You may report side effects to FDA at 1-800-FDA-1088. Where should I keep my medicine? Keep out of the reach of children. Store at room temperature between 15 and 30 degrees C (59 and 86 degrees F). Throw away any unused medicine after the expiration date. NOTE: This sheet is a summary. It may not cover all possible information. If you have questions about this medicine, talk to your doctor, pharmacist, or health care provider.    2016, Elsevier/Gold Standard. (2014-12-02 12:40:07)

## 2016-04-30 ENCOUNTER — Telehealth: Payer: Self-pay | Admitting: *Deleted

## 2016-04-30 ENCOUNTER — Other Ambulatory Visit: Payer: Self-pay | Admitting: Pediatrics

## 2016-04-30 LAB — GC/CHLAMYDIA PROBE AMP
CT PROBE, AMP APTIMA: DETECTED — AB
GC PROBE AMP APTIMA: NOT DETECTED

## 2016-04-30 LAB — RPR

## 2016-04-30 LAB — WET PREP BY MOLECULAR PROBE
CANDIDA SPECIES: NEGATIVE
Gardnerella vaginalis: POSITIVE — AB
Trichomonas vaginosis: NEGATIVE

## 2016-04-30 LAB — HIV ANTIBODY (ROUTINE TESTING W REFLEX): HIV 1&2 Ab, 4th Generation: NONREACTIVE

## 2016-04-30 MED ORDER — AZITHROMYCIN 500 MG PO TABS: 1000.0000 mg | ORAL_TABLET | Freq: Once | ORAL | Status: DC

## 2016-04-30 MED ORDER — METRONIDAZOLE 0.75 % VA GEL: 1.0000 | Freq: Every day | VAGINAL | Status: AC

## 2016-04-30 NOTE — Telephone Encounter (Signed)
LVM requesting callback ASAP to discuss last OV results. Callback phone number provided.

## 2016-04-30 NOTE — Telephone Encounter (Signed)
TC from pt. Requests tx for chlamydia be sent to pharmacy, with partner tx. Advised to abstain for 7 days from tx to prevent reinfection. Pt verbalized understanding.

## 2016-04-30 NOTE — Telephone Encounter (Signed)
Sent azithromycin to the pharmacy- 1 gram once. Provided 1 refill that she can get for the EPT for her partner.

## 2016-04-30 NOTE — Telephone Encounter (Signed)
-----   Message from Trude Mcburney, [place] sent at 04/30/2016  8:39 AM EDT ----- Chlamydia and BV are positive. I have sent in [facility] treatment to the pharmacy in the form of vaginal gel since flagyl tablets have caused vomiting in the past. We can treat chlamydia in the office and provide expedited partner therapy or I can send to the pharmacy. Please let me know which is preferred. Both partners will need to wait 7 days before having any kind of sexual intercourse after both are treated. If you have had other partners since March, they also need to be notified.

## 2016-05-01 ENCOUNTER — Other Ambulatory Visit: Payer: Self-pay | Admitting: Pediatrics

## 2016-05-01 ENCOUNTER — Encounter: Payer: Self-pay | Admitting: Pediatrics

## 2016-05-01 MED ORDER — AZITHROMYCIN 500 MG PO TABS: 1000.0000 mg | ORAL_TABLET | Freq: Once | ORAL | Status: DC

## 2016-05-13 ENCOUNTER — Ambulatory Visit: Payer: Self-pay | Admitting: Pediatrics

## 2016-05-13 ENCOUNTER — Encounter: Payer: Self-pay | Admitting: Pediatrics

## 2016-05-13 NOTE — Progress Notes (Signed)
Pre-Visit Planning  Angela Branch  is a 20 y.o. female referred by Angela Solders, MD.   Last seen in [facility] on  04/29/16 for anxiety, chlamydia.   Plan at last visit included rx for azithro, start prozac.  Date and Type of Previous Psych Screenings? Yes  Clinical Staff Visit Tasks:   - Urine GC/CT due? no - HIV Screening due?  no - Psych Screenings Due? No - usual workup- too early for repeat gc/chlamydia   Provider Visit Tasks: - ensure patient and all partners treated  - discuss success with prozac- make changes if needed - [facility] Involvement? Maybe - Pertinent Labs? No  >5 minutes spent reviewing records and planning for patient's visit.

## 2016-05-15 ENCOUNTER — Ambulatory Visit: Payer: Medicaid Other | Admitting: Pediatrics

## 2016-05-29 ENCOUNTER — Ambulatory Visit: Payer: Medicaid Other | Admitting: Family

## 2016-05-30 ENCOUNTER — Ambulatory Visit (INDEPENDENT_AMBULATORY_CARE_PROVIDER_SITE_OTHER): Payer: Medicaid Other | Admitting: Family

## 2016-05-30 ENCOUNTER — Other Ambulatory Visit: Payer: Self-pay | Admitting: Family

## 2016-05-30 VITALS — BP 118/65 | HR 93 | Ht 64.0 in | Wt 120.0 lb

## 2016-05-30 DIAGNOSIS — Z30017 Encounter for initial prescription of implantable subdermal contraceptive: Secondary | ICD-10-CM

## 2016-05-30 DIAGNOSIS — N921 Excessive and frequent menstruation with irregular cycle: Secondary | ICD-10-CM

## 2016-05-30 DIAGNOSIS — L309 Dermatitis, unspecified: Secondary | ICD-10-CM | POA: Diagnosis not present

## 2016-05-30 DIAGNOSIS — Z7251 High risk heterosexual behavior: Secondary | ICD-10-CM

## 2016-05-30 DIAGNOSIS — Z3202 Encounter for pregnancy test, result negative: Secondary | ICD-10-CM

## 2016-05-30 DIAGNOSIS — Z113 Encounter for screening for infections with a predominantly sexual mode of transmission: Secondary | ICD-10-CM

## 2016-05-30 DIAGNOSIS — Z3049 Encounter for surveillance of other contraceptives: Secondary | ICD-10-CM | POA: Diagnosis not present

## 2016-05-30 LAB — POCT URINE PREGNANCY: Preg Test, Ur: NEGATIVE

## 2016-05-30 MED ORDER — TRIAMCINOLONE ACETONIDE 0.5 % EX OINT: 1.0000 "application " | TOPICAL_OINTMENT | Freq: Two times a day (BID) | CUTANEOUS | Status: DC

## 2016-05-30 MED ORDER — ULIPRISTAL ACETATE 30 MG PO TABS
30.0000 mg | ORAL_TABLET | Freq: Once | ORAL | Status: AC
Start: 2016-05-30 — End: 2016-05-30
  Administered 2016-05-30: 30 mg via ORAL

## 2016-05-30 MED ORDER — ETONOGESTREL 68 MG ~~LOC~~ IMPL
68.0000 mg | DRUG_IMPLANT | Freq: Once | SUBCUTANEOUS | Status: AC
  Administered 2016-05-30: 68 mg via SUBCUTANEOUS

## 2016-05-30 MED ORDER — NORETHIN ACE-ETH ESTRAD-FE 1-20 MG-MCG PO TABS: 1.0000 | ORAL_TABLET | Freq: Every day | ORAL | Status: DC

## 2016-05-30 NOTE — Progress Notes (Signed)
THIS RECORD MAY CONTAIN CONFIDENTIAL INFORMATION THAT SHOULD NOT BE RELEASED WITHOUT REVIEW OF THE SERVICE PROVIDER.  Adolescent Medicine Consultation Follow-Up Visit Angela Branch  is a 20 y.o. female referred by Georgiann Hahn, MD here today for follow-up.    Previsit planning completed:  Yes Pre-Visit Planning  Angela Branch  is a 20 y.o. female referred by Georgiann Hahn, MD.   Last seen in [facility] on  04/29/16 for anxiety, chlamydia.   Plan at last visit included rx for azithro, start prozac.  Date and Type of Previous Psych Screenings? Yes  Clinical Staff Visit Tasks:   - Urine GC/CT due? no - HIV Screening due?  no - Psych Screenings Due? No - usual workup- too early for repeat gc/chlamydia   Provider Visit Tasks: - ensure patient and all partners treated  - discuss success with prozac- make changes if needed - [facility] Involvement? Maybe - Pertinent Labs? No  >5 minutes spent reviewing records and planning for patient's visit.   Growth Chart Viewed? no   History was provided by the patient.  PCP Confirmed?  Yes, Dr. Ardyth Man   My Chart Activated?   yes   HPI:    -Did not like Prozac, feels like anytime she doesn't eat it makes her sick.  -Took it for about 3 weeks; she does not feel like it changed her mood. Denies SI/HI.  -This is the first antidepressant she has ever tried. Does not wish to try it again.  -Not doing the patch because it started burning her arm.  -No contraception; would like birth control pills.  -Last intercourse: Monday or Tuesday.   Review of Systems  Constitutional: Negative.   HENT: Negative.   Eyes: Negative.   Respiratory: Negative.   Cardiovascular: Negative.   Gastrointestinal: Negative.   Genitourinary: Negative.   Musculoskeletal: Negative.   Skin: Negative.   Neurological: Negative.   Endo/Heme/Allergies: Negative.   Psychiatric/Behavioral: Negative.    Patient's last menstrual period was  05/03/2016. Gets period every month; bleeds about a week each time.  No Known Allergies Outpatient Prescriptions Prior to Visit  Medication Sig Dispense Refill  . albuterol (PROVENTIL HFA;VENTOLIN HFA) 108 (90 BASE) MCG/ACT inhaler Inhale 2 puffs into the lungs every 4 (four) hours as needed for wheezing. (Patient not taking: Reported on 05/30/2016) 1 Inhaler 0  . albuterol (PROVENTIL HFA;VENTOLIN HFA) 108 (90 Base) MCG/ACT inhaler Inhale 2 puffs into the lungs every 6 (six) hours as needed for wheezing or shortness of breath. 1 Inhaler 12  . FLUoxetine (PROZAC) 20 MG capsule Take 1 capsule (20 mg total) by mouth daily. (Patient not taking: Reported on 05/30/2016) 30 capsule 1  . norelgestromin-ethinyl estradiol (ORTHO EVRA) 150-35 MCG/24HR transdermal patch Place 1 patch onto the skin once a week. (Patient not taking: Reported on 05/30/2016) 3 patch 12  . azithromycin (ZITHROMAX) 500 MG tablet Take 2 tablets (1,000 mg total) by mouth once. 4 tablet 0   No facility-administered medications prior to visit.     Patient Active Problem List   Diagnosis Date Noted  . Adjustment disorder with mixed anxiety and depressed mood 04/29/2016  . Encounter for surveillance of transdermal contraceptive 04/29/2016  . GBS (group B Streptococcus carrier), +RV culture, currently pregnant 11/14/2015  . GERD (gastroesophageal reflux disease) 09/21/2015  . Encounter for supervision of normal pregnancy in teen primigravida, antepartum 07/17/2015  . Insufficient prenatal care in second trimester 07/17/2015  . Substance abuse 03/24/2015  . Eczema 10/04/2014  . Asthma  06/12/2012     The following portions of the patient's history were reviewed and updated as appropriate: allergies, current medications, past family history, past medical history, past social history, past surgical history and problem list.  Physical Exam:  Filed Vitals:   05/30/16 1332  BP: 118/65  Pulse: 93  Height: $Remove'5\' 4"'VLrfFzM$  (1.626 m)  Weight: 120 lb  (54.432 kg)   BP 118/65 mmHg  Pulse 93  Ht $R'5\' 4"'dW$  (1.626 m)  Wt 120 lb (54.432 kg)  BMI 20.59 kg/m2  LMP 05/03/2016 Body mass index: body mass index is 20.59 kg/(m^2). Blood pressure percentiles are 57% systolic and 84% diastolic based on 6962 NHANES data. Blood pressure percentile targets: 90: 124/79, 95: 128/83, 99 + 5 mmHg: 140/96.  Physical Exam  Constitutional: She is oriented to person, place, and time. She appears well-developed. No distress.  HENT:  Head: Normocephalic and atraumatic.  Eyes: EOM are normal. Pupils are equal, round, and reactive to light. No scleral icterus.  Neck: Normal range of motion. Neck supple. No thyromegaly present.  Cardiovascular: Normal rate, regular rhythm, normal heart sounds and intact distal pulses.   No murmur heard. Pulmonary/Chest: Effort normal and breath sounds normal.  Abdominal: Soft.  Musculoskeletal: Normal range of motion. She exhibits no edema.  Lymphadenopathy:    She has no cervical adenopathy.  Neurological: She is alert and oriented to person, place, and time. No cranial nerve deficit.  Skin: Skin is warm and dry. No rash noted.  Psychiatric: She has a normal mood and affect. Her behavior is normal. Judgment and thought content normal.  Vitals reviewed.   Assessment/Plan: 1. Encounter for initial prescription of Nexplanon -after review of Tier 1 and Tier 2 options, patient elects Nexplanon -Reviewed BTB and offered Junel Fe today for bleeding; warmed that infections often cause bleeding and will need to rule out infections if bleeding persists or new symptoms.   2. Unprotected sex  - ulipristal acetate (ELLA) tablet 30 mg; Take 1 tablet (30 mg total) by mouth once.  3. Breakthrough bleeding -as per above  - norethindrone-ethinyl estradiol (JUNEL FE 1/20) 1-20 MG-MCG tablet; Take 1 tablet by mouth daily.  Dispense: 1 Package; Refill: 1  4. Eczema -patient request refill given  - triamcinolone ointment (KENALOG) 0.5 %;  Apply 1 application topically 2 (two) times daily.  Dispense: 30 g; Refill: 0  5. Negative pregnancy test -per protocol - POCT urine pregnancy  6. Routine screening for STI (sexually transmitted infection) -per protocol  - GC Probe amplification, urine - RPR - HIV antibody (with reflex)   Follow-up:  Return for Nexplanon Follow-Up.   Medical decision-making:  >40 minutes spent, more than 50% of appointment was spent discussing diagnosis and management of symptoms

## 2016-05-30 NOTE — Patient Instructions (Signed)
Follow-up  in 1 month. Schedule this appointment before you leave clinic today.  Congratulations on getting your Nexplanon placement!  Below is some important information about Nexplanon.  First remember that Nexplanon does not prevent sexually transmitted infections.  Condoms will help prevent sexually transmitted infections. The Nexplanon starts working 7 days after it was inserted.  There is a risk of getting pregnant if you have unprotected sex in those first 7 days after placement of the Nexplanon.  The Nexplanon lasts for 3 years but can be removed at any time.  You can become pregnant as early as 1 week after removal.  You can have a new Nexplanon put in after the old one is removed if you like.  It is not known whether Nexplanon is as effective in women who are very overweight because the studies did not include many overweight women.  Nexplanon interacts with some medications, including barbiturates, bosentan, carbamazepine, felbamate, griseofulvin, oxcarbazepine, phenytoin, rifampin, St. John's wort, topiramate, HIV medicines.  Please alert your doctor if you are on any of these medicines.  Always tell other healthcare providers that you have a Nexplanon in your arm.  The Nexplanon was placed just under the skin.  Leave the outside bandage on for 24 hours.  Leave the smaller bandage on for 3-5 days or until it falls off on its own.  Keep the area clean and dry for 3-5 days. There is usually bruising or swelling at the insertion site for a few days to a week after placement.  If you see redness or pus draining from the insertion site, call us immediately.  Keep your user card with the date the implant was placed and the date the implant is to be removed.  The most common side effect is a change in your menstrual bleeding pattern.   This bleeding is generally not harmful to you but can be annoying.  Call or come in to see [name] if you have any concerns about the bleeding or if you have any  side effects or questions.    We will call you in 1 week to check in and we would like you to return to the clinic for a follow-up visit in 1 month.  You can call [facility] 24 hours a day with any questions or concerns.  There is always a nurse or doctor available to take your call.  Call 9-1-1 if you have a life-threatening emergency.  For anything else, please call us at [phone] before heading to the ER.

## 2016-05-31 LAB — NEISSERIA GONORRHOEAE, PROBE AMP: GC Probe RNA: NOT DETECTED

## 2016-05-31 LAB — SYPHILIS: RPR W/REFLEX TO RPR TITER AND TREPONEMAL ANTIBODIES, TRADITIONAL SCREENING AND DIAGNOSIS ALGORITHM

## 2016-05-31 LAB — HIV ANTIBODY (ROUTINE TESTING W REFLEX): HIV 1&2 Ab, 4th Generation: NONREACTIVE

## 2016-06-05 ENCOUNTER — Encounter: Payer: Self-pay | Admitting: *Deleted

## 2016-06-06 ENCOUNTER — Encounter: Payer: Self-pay | Admitting: Family

## 2016-06-06 NOTE — Procedures (Signed)
Nexplanon Insertion  No contraindications for placement.  No liver disease, no unexplained vaginal bleeding, no h/o breast cancer, no h/o blood clots.  Patient's last menstrual period was 05/03/2016.  UHCG: negative  Last Unprotected sex:  Within the week; Festus Holts given   Risks & benefits of Nexplanon discussed The nexplanon device was purchased and supplied by [facility]. Packaging instructions supplied to patient Consent form signed  The patient denies any allergies to anesthetics or antiseptics.  Procedure: Pt was placed in supine position. The left arm was flexed at the elbow and externally rotated so that her wrist was parallel to her ear The medial epicondyle of the left arm was identified The insertions site was marked 8 cm proximal to the medial epicondyle The insertion site was cleaned with Betadine The area surrounding the insertion site was covered with a sterile drape 1% lidocaine was injected just under the skin at the insertion site extending 4 cm proximally. The sterile preloaded disposable Nexaplanon applicator was removed from the sterile packaging The applicator needle was inserted at a 30 degree angle at 8 cm proximal to the medial epicondyle as marked The applicator was lowered to a horizontal position and advanced just under the skin for the full length of the needle The slider on the applicator was retracted fully while the applicator remained in the same position, then the applicator was removed. The implant was confirmed via palpation as being in position The implant position was demonstrated to the patient Pressure dressing was applied to the patient.  The patient was instructed to removed the pressure dressing in 24 hrs.  The patient was advised to move slowly from a supine to an upright position  The patient denied any concerns or complaints  The patient was instructed to schedule a follow-up appt in 1 month and to call sooner if any concerns.  The patient  acknowledged agreement and understanding of the plan.

## 2016-06-11 ENCOUNTER — Encounter (HOSPITAL_COMMUNITY): Payer: Self-pay | Admitting: Emergency Medicine

## 2016-06-11 ENCOUNTER — Emergency Department (HOSPITAL_COMMUNITY)
Admission: EM | Admit: 2016-06-11 | Discharge: 2016-06-11 | Disposition: A | Discharge status: Home / Self Care | Payer: Medicaid Other | Attending: Emergency Medicine | Admitting: Emergency Medicine

## 2016-06-11 ENCOUNTER — Emergency Department (HOSPITAL_COMMUNITY): Payer: Medicaid Other

## 2016-06-11 DIAGNOSIS — R197 Diarrhea, unspecified: Secondary | ICD-10-CM

## 2016-06-11 DIAGNOSIS — K2970 Gastritis, unspecified, without bleeding: Secondary | ICD-10-CM | POA: Diagnosis not present

## 2016-06-11 DIAGNOSIS — K529 Noninfective gastroenteritis and colitis, unspecified: Secondary | ICD-10-CM | POA: Insufficient documentation

## 2016-06-11 DIAGNOSIS — J45909 Unspecified asthma, uncomplicated: Secondary | ICD-10-CM | POA: Diagnosis not present

## 2016-06-11 DIAGNOSIS — R112 Nausea with vomiting, unspecified: Secondary | ICD-10-CM

## 2016-06-11 DIAGNOSIS — Z79899 Other long term (current) drug therapy: Secondary | ICD-10-CM | POA: Insufficient documentation

## 2016-06-11 DIAGNOSIS — R1084 Generalized abdominal pain: Secondary | ICD-10-CM | POA: Diagnosis present

## 2016-06-11 LAB — URINE MICROSCOPIC-ADD ON

## 2016-06-11 LAB — URINALYSIS, ROUTINE W REFLEX MICROSCOPIC
BILIRUBIN URINE: NEGATIVE
GLUCOSE, UA: NEGATIVE mg/dL
HGB URINE DIPSTICK: NEGATIVE
Ketones, ur: NEGATIVE mg/dL
Nitrite: NEGATIVE
Protein, ur: NEGATIVE mg/dL
SPECIFIC GRAVITY, URINE: 1.029 (ref 1.005–1.030)
pH: 6 (ref 5.0–8.0)

## 2016-06-11 LAB — CBC
HCT: 42 % (ref 36.0–46.0)
HEMOGLOBIN: 15 g/dL (ref 12.0–15.0)
MCH: 29.6 pg (ref 26.0–34.0)
MCHC: 35.7 g/dL (ref 30.0–36.0)
MCV: 82.8 fL (ref 78.0–100.0)
Platelets: 242 10*3/uL (ref 150–400)
RBC: 5.07 MIL/uL (ref 3.87–5.11)
RDW: 13.9 % (ref 11.5–15.5)
WBC: 9.5 10*3/uL (ref 4.0–10.5)

## 2016-06-11 LAB — COMPREHENSIVE METABOLIC PANEL
ALBUMIN: 4.2 g/dL (ref 3.5–5.0)
ALK PHOS: 49 U/L (ref 38–126)
ALT: 16 U/L (ref 14–54)
AST: 20 U/L (ref 15–41)
Anion gap: 9 (ref 5–15)
BUN: 10 mg/dL (ref 6–20)
CALCIUM: 8.9 mg/dL (ref 8.9–10.3)
CHLORIDE: 107 mmol/L (ref 101–111)
CO2: 23 mmol/L (ref 22–32)
CREATININE: 0.86 mg/dL (ref 0.44–1.00)
GFR calc Af Amer: >60 mL/min (ref >60)
GFR calc non Af Amer: >60 mL/min (ref >60)
GLUCOSE: 112 mg/dL — AB (ref 65–99)
Potassium: 3.4 mmol/L — ABNORMAL LOW (ref 3.5–5.1)
SODIUM: 139 mmol/L (ref 135–145)
Total Bilirubin: 1.3 mg/dL — ABNORMAL HIGH (ref 0.3–1.2)
Total Protein: 7.1 g/dL (ref 6.5–8.1)

## 2016-06-11 LAB — LIPASE, BLOOD: LIPASE: 20 U/L (ref 11–51)

## 2016-06-11 LAB — I-STAT BETA HCG BLOOD, ED (MC, WL, AP ONLY)

## 2016-06-11 LAB — MONONUCLEOSIS SCREEN: Mono Screen: NEGATIVE

## 2016-06-11 MED ORDER — ONDANSETRON HCL 4 MG/2ML IJ SOLN
4.0000 mg | Freq: Once | INTRAMUSCULAR | Status: AC
  Administered 2016-06-11: 4 mg via INTRAVENOUS
  Filled 2016-06-11: qty 2

## 2016-06-11 MED ORDER — RANITIDINE HCL 150 MG PO TABS: 150.0000 mg | ORAL_TABLET | Freq: Two times a day (BID) | ORAL | Status: DC

## 2016-06-11 MED ORDER — ONDANSETRON 8 MG PO TBDP: 8.0000 mg | ORAL_TABLET | Freq: Three times a day (TID) | ORAL | Status: DC | PRN

## 2016-06-11 MED ORDER — FAMOTIDINE IN NACL 20-0.9 MG/50ML-% IV SOLN
20.0000 mg | Freq: Once | INTRAVENOUS | Status: AC
  Administered 2016-06-11: 20 mg via INTRAVENOUS
  Filled 2016-06-11: qty 50

## 2016-06-11 MED ORDER — KETOROLAC TROMETHAMINE 30 MG/ML IJ SOLN
30.0000 mg | Freq: Once | INTRAMUSCULAR | Status: AC
  Administered 2016-06-11: 30 mg via INTRAVENOUS
  Filled 2016-06-11: qty 1

## 2016-06-11 MED ORDER — SODIUM CHLORIDE 0.9 % IV BOLUS (SEPSIS)
1000.0000 mL | Freq: Once | INTRAVENOUS | Status: AC
  Administered 2016-06-11: 1000 mL via INTRAVENOUS

## 2016-06-11 MED ORDER — GI COCKTAIL ~~LOC~~
30.0000 mL | Freq: Once | ORAL | Status: AC
  Administered 2016-06-11: 30 mL via ORAL
  Filled 2016-06-11: qty 30

## 2016-06-11 NOTE — ED Notes (Signed)
Pt c/o sharp generalized abdominal pain, which intermittently localizes to various different areas of abdomen, emesis, diarrhea x 2 weeks, onset after having cold symptoms which have not completely resolved.

## 2016-06-11 NOTE — ED Provider Notes (Signed)
CSN: [id number]     Arrival date & time 06/11/16  5573 History   First MD Initiated Contact with Patient 06/11/16 [phone]     Chief Complaint  Patient presents with  . Abdominal Pain  . Emesis     (Consider location/radiation/quality/duration/timing/severity/associated sxs/prior Treatment) HPI Comments: Angela Branch is a 20 y.o. female with a PMHx of asthma, anxiety, and anemia, who presents to the ED with multiple complaints. Patient states that she had a cold approximately 2 weeks ago which has improved, but she continues to have intermittent generalized abdominal pain, nausea, vomiting, and diarrhea. Overall her symptoms have improved somewhat, reporting that she is only had one episode of vomiting in the last 24 hours consisting of nonbloody nonbilious dark mucus/stomach contents (no coffee ground emesis), and one episode of diarrhea the last 24 hours which was looser than normal but not watery like it had been. She does report bright red blood per rectum, but she has known hemorrhoids and believes this is the source. She describes her abdominal pain is 6/10 intermittent throbbing generalized throughout her abdomen, radiating "to her entire body", with no known aggravating or alleviating factors given that she has not tried anything prior to arrival. Additional associated symptoms include body aches. She states she hasn't been able to eat much since onset of her symptoms, although she does arrive with a [employer] orange juice.  She denies any fevers, chills, chest pain, shortness breath, constipation, obstipation, melena, hematemesis, dysuria, hematuria, vaginal bleeding or discharge, numbness, tingling, weakness, recent travel, sick contacts, suspicious food intake, antibiotic use, alcohol use, NSAID use, or prior abdominal surgeries. PCP is at [facility]. Recently postpartum, delivered in Dec 2016.  Patient is a 20 y.o. female presenting with abdominal pain and vomiting. The  history is provided by the patient and medical records. No language interpreter was used.  Abdominal Pain Pain location:  Generalized Pain quality: throbbing   Pain radiation: "to my entire body" Pain severity:  Moderate Onset quality:  Gradual Duration:  2 weeks Timing:  Constant Progression:  Improving Chronicity:  New Context: recent illness (URI which resolved)   Context: not previous surgeries, not recent travel, not sick contacts and not suspicious food intake   Relieved by:  None tried Worsened by:  Nothing tried Ineffective treatments:  None tried Associated symptoms: diarrhea, hematochezia (known hemorrhoids), nausea and vomiting   Associated symptoms: no chest pain, no chills, no constipation, no dysuria, no fever, no flatus, no hematemesis, no hematuria, no melena, no shortness of breath, no vaginal bleeding and no vaginal discharge   Risk factors: no alcohol abuse, has not had multiple surgeries, no NSAID use and not pregnant   Emesis Associated symptoms: abdominal pain, diarrhea and myalgias (body aches)   Associated symptoms: no arthralgias and no chills     Past Medical History  Diagnosis Date  . Asthma   . Anxiety   . Anemia    Past Surgical History  Procedure Laterality Date  . Tooth extraction     Family History  Problem Relation Age of Onset  . Asthma Father   . Diabetes Maternal Grandmother   . Hyperlipidemia Paternal Grandfather   . Heart disease Paternal Grandfather   . Mental illness Neg Hx   . Birth defects Neg Hx   . Kidney disease Neg Hx   . Hypertension Neg Hx   . Cancer - Colon Maternal Grandfather    Social History  Substance Use Topics  . Smoking status: Never  Smoker   . Smokeless tobacco: Never Used  . Alcohol Use: No   OB History    Gravida Para Term Preterm AB TAB SAB Ectopic Multiple Living   '2 1 1 '$ 0 1 0 1 0 0 1     Review of Systems  Constitutional: Negative for fever and chills.  Respiratory: Negative for shortness of  breath.   Cardiovascular: Negative for chest pain.  Gastrointestinal: Positive for nausea, vomiting, abdominal pain, diarrhea, hematochezia (known hemorrhoids) and anal bleeding (BRBPR, known hemorrhoids). Negative for constipation, blood in stool, melena, flatus and hematemesis.  Genitourinary: Negative for dysuria, hematuria, vaginal bleeding and vaginal discharge.  Musculoskeletal: Positive for myalgias (body aches). Negative for arthralgias.  Skin: Negative for color change.  Allergic/Immunologic: Negative for immunocompromised state.  Neurological: Negative for weakness and numbness.  Psychiatric/Behavioral: Negative for confusion.   10 Systems reviewed and are negative for acute change except as noted in the HPI.    Allergies  Review of patient's allergies indicates no known allergies.  Home Medications   Prior to Admission medications   Medication Sig Start Date End Date Taking? Authorizing Provider  albuterol (PROVENTIL HFA;VENTOLIN HFA) 108 (90 BASE) MCG/ACT inhaler Inhale 2 puffs into the lungs every 4 (four) hours as needed for wheezing. Patient not taking: Reported on 05/30/2016 06/12/12 04/11/17  [name], MD  albuterol (PROVENTIL HFA;VENTOLIN HFA) 108 (90 Base) MCG/ACT inhaler Inhale 2 puffs into the lungs every 6 (six) hours as needed for wheezing or shortness of breath. 04/11/16 05/11/16  [name], MD  FLUoxetine (PROZAC) 20 MG capsule Take 1 capsule (20 mg total) by mouth daily. Patient not taking: Reported on 05/30/2016 04/29/16   Trude Mcburney, FNP  norelgestromin-ethinyl estradiol (ORTHO EVRA) 150-35 MCG/24HR transdermal patch Place 1 patch onto the skin once a week. Patient not taking: Reported on 05/30/2016 04/29/16   Trude Mcburney, FNP  norethindrone-ethinyl estradiol (JUNEL FE 1/20) 1-20 MG-MCG tablet Take 1 tablet by mouth daily. 03/23/73   Dierdre Harness, NP  triamcinolone ointment (KENALOG) 0.5 % Apply 1 application topically 2 (two) times daily.  0/8/14   Christy Millican, NP   BP 481/85 mmHg  Pulse 73  Temp(Src) 98.3 F (36.8 C) (Oral)  Resp 16  SpO2 100%  LMP 05/03/2016 Physical Exam  Constitutional: She is oriented to person, place, and time. Vital signs are normal. She appears well-developed and well-nourished.  Non-toxic appearance. No distress.  Afebrile, nontoxic, NAD; [employer] orange juice at bedside  HENT:  Head: Normocephalic and atraumatic.  Mouth/Throat: Oropharynx is clear and moist and mucous membranes are normal.  Eyes: Conjunctivae and EOM are normal. Right eye exhibits no discharge. Left eye exhibits no discharge.  Neck: Normal range of motion. Neck supple.  Cardiovascular: Normal rate, regular rhythm, normal heart sounds and intact distal pulses.  Exam reveals no gallop and no friction rub.   No murmur heard. Pulmonary/Chest: Effort normal and breath sounds normal. No respiratory distress. She has no decreased breath sounds. She has no wheezes. She has no rhonchi. She has no rales.  Abdominal: Soft. Normal appearance and bowel sounds are normal. She exhibits no distension. There is tenderness in the right upper quadrant. There is positive Murphy's sign. There is no rigidity, no rebound, no guarding, no CVA tenderness and no tenderness at McBurney's point.  Soft, nondistended, +BS throughout, with mild diffuse generalized TTP but most focal area of TTP in RUQ, no r/g/r, +murphy's, neg mcburney's, no CVA TTP   Genitourinary:  Pt declined GU/rectal  exam  Musculoskeletal: Normal range of motion.  Neurological: She is alert and oriented to person, place, and time. She has normal strength. No sensory deficit.  Skin: Skin is warm, dry and intact. No rash noted.  Psychiatric: She has a normal mood and affect.  Nursing note and vitals reviewed.   ED Course  Procedures (including critical care time) Labs Review Labs Reviewed  COMPREHENSIVE METABOLIC PANEL - Abnormal; Notable for the following:    Potassium 3.4  (*)    Glucose, Bld 112 (*)    Total Bilirubin 1.3 (*)    All other components within normal limits  URINALYSIS, ROUTINE W REFLEX MICROSCOPIC (NOT AT [facility]) - Abnormal; Notable for the following:    APPearance CLOUDY (*)    Leukocytes, UA TRACE (*)    All other components within normal limits  URINE MICROSCOPIC-ADD ON - Abnormal; Notable for the following:    Squamous Epithelial / LPF 0-5 (*)    Bacteria, UA FEW (*)    All other components within normal limits  LIPASE, BLOOD  CBC  MONONUCLEOSIS SCREEN  I-STAT BETA HCG BLOOD, ED ([facility], [facility], [facility] ONLY)    Imaging Review US Abdomen Complete  06/11/2016  CLINICAL DATA:  Abdominal pain, nausea and vomiting for 2 weeks. EXAM: ABDOMEN ULTRASOUND COMPLETE COMPARISON:  None. FINDINGS: Gallbladder: No gallstones or wall thickening visualized. No sonographic Murphy sign noted by sonographer. Common bile duct: Diameter: 0.2 cm Liver: No focal lesion identified. Within normal limits in parenchymal echogenicity. IVC: No abnormality visualized. Pancreas: Visualized portion unremarkable. Spleen: Size and appearance within normal limits. Right Kidney: Length: 10.6 cm. Echogenicity within normal limits. No mass or hydronephrosis visualized. Left Kidney: Length: 9.5 cm. Echogenicity within normal limits. No mass or hydronephrosis visualized. Abdominal aorta: No aneurysm visualized. Other findings: None. IMPRESSION: Negative for gallstones.  Negative exam. Electronically Signed   By: Inge Rise M.D.   On: 06/11/2016 08:14   I have personally reviewed and evaluated these images and lab results as part of my medical decision-making.   EKG Interpretation None      MDM   Final diagnoses:  Nausea vomiting and diarrhea  Generalized abdominal pain  Gastroenteritis  Gastritis    20 y.o. female here with vague complaints x2 weeks, including intermittent generalized abdominal pain and generalized body aches, nausea vomiting diarrhea which overall are  improving but still present. All started after having a cold, which is resolved at this time. She comes in with a [employer] orange juice at bedside. On exam, generalized abd tenderness most focally in the RUQ, +murphy's, nonperitoneal; otherwise benign physical exam. She deferred GU/rectal exam since she has known hemorrhoids and did not want a DRE despite reporting BRBPR. CBC unremarkable, although could be slightly hemoconcentrated since H/H much higher than prior studies. Will give fluids. BetaHCG neg, other labs pending. Will add-on mono screen as well since symptoms started after a cold. Will get abd u/s to evaluate gallbladder vs other etiologies. Will give toradol, pepcid, GI cocktail, and zofran in addition to IVFs. Will reassess shortly  9:03 AM  Mono screen neg, U/A with trace leuks neg nitrites 0-5 squamous 0-5 WBC 0-5 RBCs with few bacteria therefore doubt UTI, lipase WNL, CMP with mildly elevated bili 1.3 which is likely dehydration related, mildly low K 3.4 doubt need for repletion. Abd U/S unremarkable. Overall symptoms could be from viral illness vs gastritis, discussed PUD diet modifications. Will start on zantac. Rx for zofran. Discussed tylenol for pain, avoid NSAIDs if possible  but only take sparingly and always on full stomach. Tums/Maalox PRN for additional relief. F/up with PCP in 1wk for ongoing management of symptoms. OTC immodium as needed for diarrhea although this seems to be improving so use sparingly. Stay hydrated. I explained the diagnosis and have given explicit precautions to return to the ER including for any other new or worsening symptoms. The patient understands and accepts the medical plan as it's been dictated and I have answered their questions. Discharge instructions concerning home care and prescriptions have been given. The patient is STABLE and is discharged to home in good condition.  BP 95/55 mmHg  Pulse 70  Temp(Src) 98.3 F (36.8 C) (Oral)  Resp 16  SpO2  97%  LMP 05/03/2016  Meds ordered this encounter  Medications  . sodium chloride 0.9 % bolus 1,000 mL    Sig:   . ondansetron (ZOFRAN) injection 4 mg    Sig:   . ketorolac (TORADOL) 30 MG/ML injection 30 mg    Sig:   . famotidine (PEPCID) IVPB 20 mg premix    Sig:   . gi cocktail (Maalox,Lidocaine,Donnatal)    Sig:   . ranitidine (ZANTAC) 150 MG tablet    Sig: Take 1 tablet (150 mg total) by mouth 2 (two) times daily.    Dispense:  30 tablet    Refill:  0    Order Specific Question:  Supervising Provider    Answer:  MILLER, BRIAN [[id number]]  . ondansetron (ZOFRAN ODT) 8 MG disintegrating tablet    Sig: Take 1 tablet (8 mg total) by mouth every 8 (eight) hours as needed for nausea or vomiting.    Dispense:  10 tablet    Refill:  0    Order Specific Question:  Supervising Provider    Answer:  Noemi Chapel [[id number]]       Kinzi Frediani Camprubi-Soms, PA-C 06/11/16 [place], DO 06/12/16 0022

## 2016-06-11 NOTE — Discharge Instructions (Signed)
Use zofran as prescribed, as needed for nausea. Stay well hydrated with small sips of fluids throughout the day. Follow a BRAT (banana-rice-applesauce-toast) diet as described below for the next 24-48 hours. The 'BRAT' diet is suggested, then progress to diet as tolerated as symptoms abate. Your abdominal pain is likely from gastritis or an ulcer, or from a viral gastroenteritis illness. You will need to take zantac as directed, and avoid spicy/fatty/acidic foods, avoid soda/coffee/tea/alcohol. Avoid laying down flat within 30 minutes of eating. Avoid NSAIDs like ibuprofen/aleve/motrin/etc on an empty stomach. May consider using over the counter tums/maalox as needed for additional relief. Use tylenol as needed for pain. Call your regular doctor if bloody stools, persistent diarrhea, vomiting, fever or abdominal pain. Follow up with your regular doctor in 1 week for recheck of symptoms. Return to ER for changing or worsening of symptoms.  Abdominal (belly) pain can be caused by many things. Your caregiver performed an examination and possibly ordered blood/urine tests and imaging (CT scan, x-rays, ultrasound). Many cases can be observed and treated at home after initial evaluation in the emergency department. Even though you are being discharged home, abdominal pain can be unpredictable. Therefore, you need a repeated exam if your pain does not resolve, returns, or worsens. Most patients with abdominal pain don't have to be admitted to the hospital or have surgery, but serious problems like appendicitis and gallbladder attacks can start out as nonspecific pain. Many abdominal conditions cannot be diagnosed in one visit, so follow-up evaluations are very important. SEEK IMMEDIATE MEDICAL ATTENTION IF YOU DEVELOP ANY OF THE FOLLOWING SYMPTOMS:  The pain does not go away or becomes severe.   A temperature above 101 develops.   Repeated vomiting occurs (multiple episodes).   The pain becomes localized to  portions of the abdomen. The right side could possibly be appendicitis. In an adult, the left lower portion of the abdomen could be colitis or diverticulitis.   Blood is being passed in stools or vomit (bright red or black tarry stools).   Return also if you develop chest pain, difficulty breathing, dizziness or fainting, or become confused, poorly responsive, or inconsolable (young children).  The constipation stays for more than 4 days.   There is belly (abdominal) or rectal pain.   You do not seem to be getting better.      Food Choices to Help Relieve Diarrhea When you have diarrhea, the foods you eat and your eating habits are very important. Choosing the right foods and drinks can help relieve diarrhea. Also, because diarrhea can last up to 7 days, you need to replace lost fluids and electrolytes (such as sodium, potassium, and chloride) in order to help prevent dehydration.  WHAT GENERAL GUIDELINES DO I NEED TO FOLLOW?  Slowly drink 1 cup (8 oz) of fluid for each episode of diarrhea. If you are getting enough fluid, your urine will be clear or pale yellow.  Eat starchy foods. Some good choices include white rice, white toast, pasta, low-fiber cereal, baked potatoes (without the skin), saltine crackers, and bagels.  Avoid large servings of any cooked vegetables.  Limit fruit to two servings per day. A serving is  cup or 1 small piece.  Choose foods with less than 2 g of fiber per serving.  Limit fats to less than 8 tsp (38 g) per day.  Avoid fried foods.  Eat foods that have probiotics in them. Probiotics can be found in certain dairy products.  Avoid foods and beverages that may  increase the speed at which food moves through the stomach and intestines (gastrointestinal tract). Things to avoid include:  High-fiber foods, such as dried fruit, raw fruits and vegetables, nuts, seeds, and whole grain foods.  Spicy foods and high-fat foods.  Foods and beverages sweetened with  high-fructose corn syrup, honey, or sugar alcohols such as xylitol, sorbitol, and mannitol. WHAT FOODS ARE RECOMMENDED? Grains White rice. White, [place], or pita breads (fresh or toasted), including plain rolls, buns, or bagels. White pasta. Saltine, soda, or graham crackers. Pretzels. Low-fiber cereal. Cooked cereals made with water (such as cornmeal, farina, or cream cereals). Plain muffins. Matzo. Melba toast. Zwieback.  Vegetables Potatoes (without the skin). Strained tomato and vegetable juices. Most well-cooked and canned vegetables without seeds. Tender lettuce. Fruits Cooked or canned applesauce, apricots, cherries, fruit cocktail, grapefruit, peaches, pears, or plums. Fresh bananas, apples without skin, cherries, grapes, cantaloupe, grapefruit, peaches, oranges, or plums.  Meat and Other Protein Products Baked or boiled chicken. Eggs. Tofu. Fish. Seafood. Smooth peanut butter. Ground or well-cooked tender beef, ham, veal, lamb, pork, or poultry.  Dairy Plain yogurt, kefir, and unsweetened liquid yogurt. Lactose-free milk, buttermilk, or soy milk. Plain hard cheese. Beverages Sport drinks. Clear broths. Diluted fruit juices (except prune). Regular, caffeine-free sodas such as ginger ale. Water. Decaffeinated teas. Oral rehydration solutions. Sugar-free beverages not sweetened with sugar alcohols. Other Bouillon, broth, or soups made from recommended foods.  The items listed above may not be a complete list of recommended foods or beverages. Contact your dietitian for more options. WHAT FOODS ARE NOT RECOMMENDED? Grains Whole grain, whole wheat, bran, or rye breads, rolls, pastas, crackers, and cereals. Wild or brown rice. Cereals that contain more than 2 g of fiber per serving. Corn tortillas or taco shells. Cooked or dry oatmeal. Granola. Popcorn. Vegetables Raw vegetables. Cabbage, broccoli, Brussels sprouts, artichokes, baked beans, beet greens, corn, kale, legumes, peas, sweet  potatoes, and yams. Potato skins. Cooked spinach and cabbage. Fruits Dried fruit, including raisins and dates. Raw fruits. Stewed or dried prunes. Fresh apples with skin, apricots, mangoes, pears, raspberries, and strawberries.  Meat and Other Protein Products Chunky peanut butter. Nuts and seeds. Beans and lentils. Berniece Salines.  Dairy High-fat cheeses. Milk, chocolate milk, and beverages made with milk, such as milk shakes. Cream. Ice cream. Sweets and Desserts Sweet rolls, doughnuts, and sweet breads. Pancakes and waffles. Fats and Oils Butter. Cream sauces. Margarine. Salad oils. Plain salad dressings. Olives. Avocados.  Beverages Caffeinated beverages (such as coffee, tea, soda, or energy drinks). Alcoholic beverages. Fruit juices with pulp. Prune juice. Soft drinks sweetened with high-fructose corn syrup or sugar alcohols. Other Coconut. Hot sauce. Chili powder. Mayonnaise. Gravy. Cream-based or milk-based soups.  The items listed above may not be a complete list of foods and beverages to avoid. Contact your dietitian for more information. WHAT SHOULD I DO IF I BECOME DEHYDRATED? Diarrhea can sometimes lead to dehydration. Signs of dehydration include dark urine and dry mouth and skin. If you think you are dehydrated, you should rehydrate with an oral rehydration solution. These solutions can be purchased at pharmacies, retail stores, or online.  Drink -1 cup (120-240 mL) of oral rehydration solution each time you have an episode of diarrhea. If drinking this amount makes your diarrhea worse, try drinking smaller amounts more often. For example, drink 1-3 tsp (5-15 mL) every 5-10 minutes.  A general rule for staying hydrated is to drink 1-2 L of fluid per day. Talk to your health care provider about  the specific amount you should be drinking each day. Drink enough fluids to keep your urine clear or pale yellow. Document Released: 02/29/2004 Document Revised: 12/14/2013 Document Reviewed:  11/01/2013 [facility] Patient Information 2015 [place], [place]. This information is not intended to replace advice given to you by your health care provider. Make sure you discuss any questions you have with your health care provider.    Abdominal Pain, Adult Many things can cause belly (abdominal) pain. Most times, the belly pain is not dangerous. Many cases of belly pain can be watched and treated at home. HOME CARE   Do not take medicines that help you go poop (laxatives) unless told to by your doctor.  Only take medicine as told by your doctor.  Eat or drink as told by your doctor. Your doctor will tell you if you should be on a special diet. GET HELP IF:  You do not know what is causing your belly pain.  You have belly pain while you are sick to your stomach (nauseous) or have runny poop (diarrhea).  You have pain while you pee or poop.  Your belly pain wakes you up at night.  You have belly pain that gets worse or better when you eat.  You have belly pain that gets worse when you eat fatty foods.  You have a fever. GET HELP RIGHT AWAY IF:   The pain does not go away within 2 hours.  You keep throwing up (vomiting).  The pain changes and is only in the right or left part of the belly.  You have bloody or tarry looking poop. MAKE SURE YOU:   Understand these instructions.  Will watch your condition.  Will get help right away if you are not doing well or get worse.   This information is not intended to replace advice given to you by your health care provider. Make sure you discuss any questions you have with your health care provider.   Document Released: 05/27/2008 Document Revised: 12/30/2014 Document Reviewed: 08/18/2013 Elsevier Interactive Patient Education 2016 Elsevier Inc.  Diarrhea Diarrhea is watery poop (stool). It can make you feel weak, tired, thirsty, or give you a dry mouth (signs of dehydration). Watery poop is a sign of another problem, most often  an infection. It often lasts 2-3 days. It can last longer if it is a sign of something serious. Take care of yourself as told by your doctor. HOME CARE   Drink 1 cup (8 ounces) of fluid each time you have watery poop.  Do not drink the following fluids:  Those that contain simple sugars (fructose, glucose, galactose, lactose, sucrose, maltose).  Sports drinks.  Fruit juices.  Whole milk products.  Sodas.  Drinks with caffeine (coffee, tea, soda) or alcohol.  Oral rehydration solution may be used if the doctor says it is okay. You may make your own solution. Follow this recipe:   - teaspoon table salt.   teaspoon baking soda.   teaspoon salt substitute containing potassium chloride.  1 tablespoons sugar.  1 liter (34 ounces) of water.  Avoid the following foods:  High fiber foods, such as raw fruits and vegetables.  Nuts, seeds, and whole grain breads and cereals.   Those that are sweetened with sugar alcohols (xylitol, sorbitol, mannitol).  Try eating the following foods:  Starchy foods, such as rice, toast, pasta, low-sugar cereal, oatmeal, baked potatoes, crackers, and bagels.  Bananas.  Applesauce.  Eat probiotic-rich foods, such as yogurt and milk products that are  fermented.  Wash your hands well after each time you have watery poop.  Only take medicine as told by your doctor.  Take a warm bath to help lessen burning or pain from having watery poop. GET HELP RIGHT AWAY IF:   You cannot drink fluids without throwing up (vomiting).  You keep throwing up.  You have blood in your poop, or your poop looks black and tarry.  You do not pee (urinate) in 6-8 hours, or there is only a small amount of very dark pee.  You have belly (abdominal) pain that gets worse or stays in the same spot (localizes).  You are weak, dizzy, confused, or light-headed.  You have a very bad headache.  Your watery poop gets worse or does not get better.  You have a fever  or lasting symptoms for more than 2-3 days.  You have a fever and your symptoms suddenly get worse. MAKE SURE YOU:   Understand these instructions.  Will watch your condition.  Will get help right away if you are not doing well or get worse.   This information is not intended to replace advice given to you by your health care provider. Make sure you discuss any questions you have with your health care provider.   Document Released: 05/27/2008 Document Revised: 12/30/2014 Document Reviewed: 08/16/2012 Elsevier Interactive Patient Education 2016 Elsevier Inc.  Gastritis, Adult Gastritis is soreness and puffiness (inflammation) of the lining of the stomach. If you do not get help, gastritis can cause bleeding and sores (ulcers) in the stomach. HOME CARE   Only take medicine as told by your doctor.  If you were given antibiotic medicines, take them as told. Finish the medicines even if you start to feel better.  Drink enough fluids to keep your pee (urine) clear or pale yellow.  Avoid foods and drinks that make your problems worse. Foods you may want to avoid include:  Caffeine or alcohol.  Chocolate.  Mint.  Garlic and onions.  Spicy foods.  Citrus fruits, including oranges, lemons, or limes.  Food containing tomatoes, including sauce, chili, salsa, and pizza.  Fried and fatty foods.  Eat small meals throughout the day instead of large meals. GET HELP RIGHT AWAY IF:   You have black or dark red poop (stools).  You throw up (vomit) blood. It may look like coffee grounds.  You cannot keep fluids down.  Your belly (abdominal) pain gets worse.  You have a fever.  You do not feel better after 1 week.  You have any other questions or concerns. MAKE SURE YOU:   Understand these instructions.  Will watch your condition.  Will get help right away if you are not doing well or get worse.   This information is not intended to replace advice given to you by your  health care provider. Make sure you discuss any questions you have with your health care provider.   Document Released: 05/27/2008 Document Revised: 03/02/2012 Document Reviewed: 01/22/2012 Elsevier Interactive Patient Education 2016 Elsevier Inc.  Nausea and Vomiting Nausea means you feel sick to your stomach. Throwing up (vomiting) is a reflex where stomach contents come out of your mouth. HOME CARE   Take medicine as told by your doctor.  Do not force yourself to eat. However, you do need to drink fluids.  If you feel like eating, eat a normal diet as told by your doctor.  Eat rice, wheat, potatoes, bread, lean meats, yogurt, fruits, and vegetables.  Avoid high-fat foods.  Drink enough fluids to keep your pee (urine) clear or pale yellow.  Ask your doctor how to replace body fluid losses (rehydrate). Signs of body fluid loss (dehydration) include:  Feeling very thirsty.  Dry lips and mouth.  Feeling dizzy.  Dark pee.  Peeing less than normal.  Feeling confused.  Fast breathing or heart rate. GET HELP RIGHT AWAY IF:   You have blood in your throw up.  You have black or bloody poop (stool).  You have a bad headache or stiff neck.  You feel confused.  You have bad belly (abdominal) pain.  You have chest pain or trouble breathing.  You do not pee at least once every 8 hours.  You have cold, clammy skin.  You keep throwing up after 24 to 48 hours.  You have a fever. MAKE SURE YOU:   Understand these instructions.  Will watch your condition.  Will get help right away if you are not doing well or get worse.   This information is not intended to replace advice given to you by your health care provider. Make sure you discuss any questions you have with your health care provider.   Document Released: 05/27/2008 Document Revised: 03/02/2012 Document Reviewed: 05/10/2011 Elsevier Interactive Patient Education [employer].

## 2016-06-11 NOTE — ED Notes (Signed)
Pt verbalized understanding. rx given

## 2016-06-11 NOTE — ED Notes (Signed)
Pt states pain increased after [name]. Her mom is coming to pick her up and is requesting pain medication.

## 2016-06-27 ENCOUNTER — Ambulatory Visit: Payer: Medicaid Other | Admitting: Pediatrics

## 2016-07-05 ENCOUNTER — Ambulatory Visit: Payer: Medicaid Other | Admitting: Family

## 2016-07-05 ENCOUNTER — Encounter: Payer: Self-pay | Admitting: Family

## 2016-07-05 ENCOUNTER — Ambulatory Visit (INDEPENDENT_AMBULATORY_CARE_PROVIDER_SITE_OTHER): Payer: Medicaid Other | Admitting: Family

## 2016-07-05 ENCOUNTER — Ambulatory Visit (INDEPENDENT_AMBULATORY_CARE_PROVIDER_SITE_OTHER): Payer: Medicaid Other | Admitting: Clinical

## 2016-07-05 VITALS — BP 97/63 | HR 67 | Ht 63.98 in | Wt 114.6 lb

## 2016-07-05 DIAGNOSIS — F4323 Adjustment disorder with mixed anxiety and depressed mood: Secondary | ICD-10-CM

## 2016-07-05 DIAGNOSIS — Z113 Encounter for screening for infections with a predominantly sexual mode of transmission: Secondary | ICD-10-CM | POA: Diagnosis not present

## 2016-07-05 DIAGNOSIS — Z975 Presence of (intrauterine) contraceptive device: Secondary | ICD-10-CM | POA: Diagnosis not present

## 2016-07-05 DIAGNOSIS — N921 Excessive and frequent menstruation with irregular cycle: Secondary | ICD-10-CM | POA: Diagnosis not present

## 2016-07-05 DIAGNOSIS — R634 Abnormal weight loss: Secondary | ICD-10-CM | POA: Diagnosis not present

## 2016-07-05 LAB — TSH: TSH: 0.49 m[IU]/L — AB (ref 0.50–4.30)

## 2016-07-05 LAB — T4, FREE: FREE T4: 1.5 ng/dL — AB (ref 0.8–1.4)

## 2016-07-05 NOTE — Progress Notes (Signed)
THIS RECORD MAY CONTAIN CONFIDENTIAL INFORMATION THAT SHOULD NOT BE RELEASED WITHOUT REVIEW OF THE SERVICE PROVIDER.  Adolescent Medicine Consultation Follow-Up Visit Angela Branch  is a 20 y.o. female referred by Angela Solders, MD here today for follow-up.    Previsit planning completed:  no  Growth Chart Viewed? yes   History was provided by the patient.  PCP Confirmed?  Yes, Angela Branch   My Chart Activated?   yes   HPI:    Last seen on 05/30/16. Bled for about a week and a half and then started taking pills. Has taken 13 pills. Since that time, she has had no bleeding but missed two doses (one day, then missed, then one day).  Concern for STI screenings; requesting testing today.  Having stressors r/t baby's father regarding custody issues he is bringing up.  Using marijuana for increased anxiety and sleep; reports smoking a blunt at night for sleep aid. Denies other substance use; denies SI/HI.  Interested in talking with Angela Branch today.  Reports she stopped taking Prozac 20 mg because of concern for decreased libido.  Would consider another medication if improves anxiety without affecting libido.  She reports feeling safe in relationships. She notes she has lost weight recently; feels it is the anxiety. Also notes scratchy throat; no trouble swallowing.   Review of Systems  Constitutional: Positive for weight loss. Negative for fever, chills and malaise/fatigue.  HENT: Positive for sore throat.   Eyes: Negative.   Respiratory: Negative.   Cardiovascular: Negative.   Gastrointestinal: Negative.   Genitourinary: Negative.   Skin: Negative.   Neurological: Negative.   Psychiatric/Behavioral: Positive for substance abuse. Negative for suicidal ideas and hallucinations. The patient is nervous/anxious and has insomnia.      No LMP recorded. No Known Allergies Outpatient Prescriptions Prior to Visit  Medication Sig Dispense Refill  . etonogestrel (NEXPLANON) 68 MG IMPL implant  1 each by Subdermal route once. Placed 05/2016    . triamcinolone ointment (KENALOG) 0.5 % Apply 1 application topically 2 (two) times daily. 30 g 0  . albuterol (PROVENTIL HFA;VENTOLIN HFA) 108 (90 Base) MCG/ACT inhaler Inhale 2 puffs into the lungs every 6 (six) hours as needed for wheezing or shortness of breath. 1 Inhaler 12  . FLUoxetine (PROZAC) 20 MG capsule Take 1 capsule (20 mg total) by mouth daily. (Patient not taking: Reported on 05/30/2016) 30 capsule 1  . norelgestromin-ethinyl estradiol (ORTHO EVRA) 150-35 MCG/24HR transdermal patch Place 1 patch onto the skin once a week. (Patient not taking: Reported on 05/30/2016) 3 patch 12  . norethindrone-ethinyl estradiol (JUNEL FE 1/20) 1-20 MG-MCG tablet Take 1 tablet by mouth daily. (Patient not taking: Reported on 06/11/2016) 1 Package 1  . ondansetron (ZOFRAN ODT) 8 MG disintegrating tablet Take 1 tablet (8 mg total) by mouth every 8 (eight) hours as needed for nausea or vomiting. (Patient not taking: Reported on 07/05/2016) 10 tablet 0  . ranitidine (ZANTAC) 150 MG tablet Take 1 tablet (150 mg total) by mouth 2 (two) times daily. (Patient not taking: Reported on 07/05/2016) 30 tablet 0   No facility-administered medications prior to visit.     Patient Active Problem List   Diagnosis Date Noted  . Adjustment disorder with mixed anxiety and depressed mood 04/29/2016  . GERD (gastroesophageal reflux disease) 09/21/2015  . Eczema 10/04/2014  . Asthma 06/12/2012   PHQ-SADS 07/05/2016  PHQ-15 19  GAD-7 21  PHQ-9 18  Suicidal Ideation No  Comment extremely difficult    PHQ-SADS  04/29/2016  PHQ-15 20  GAD-7 21  PHQ-9 27  Suicidal Ideation No  Comment Extremely difficult. Self harm behaviors but no suicidal plan   Confidentiality was discussed with the patient and if applicable, with caregiver as well.  Patient's personal or confidential phone number: [phone]  The following portions of the patient's history were reviewed and updated  as appropriate: allergies, current medications, past family history, past medical history, past social history, past surgical history and problem list.  Physical Exam:  Filed Vitals:   07/05/16 1447  BP: 97/63  Pulse: 67  Height: 5' 3.98" (1.625 m)  Weight: 114 lb 9.6 oz (51.982 kg)   Wt Readings from Last 3 Encounters:  07/05/16 114 lb 9.6 oz (51.982 kg) (27 %*, Z = -0.61)  05/30/16 120 lb (54.432 kg) (39 %*, Z = -0.28)  04/29/16 125 lb (56.7 kg) (50 %*, Z = -0.01)   * Growth percentiles are based on CDC 2-20 Years data.    BP 97/63 mmHg  Pulse 67  Ht 5' 3.98" (1.625 m)  Wt 114 lb 9.6 oz (51.982 kg)  BMI 19.69 kg/m2 Body mass index: body mass index is 19.69 kg/(m^2). Blood pressure percentiles are 46% systolic and 96% diastolic based on 2952 NHANES data. Blood pressure percentile targets: 90: 124/79, 95: 128/83, 99 + 5 mmHg: 140/96.  Physical Exam  Constitutional: She is oriented to person, place, and time. She appears well-developed and well-nourished.  HENT:  Head: Normocephalic.  Mouth/Throat: Oropharynx is clear and moist. No oropharyngeal exudate.  Neck: No thyromegaly present.  Cardiovascular: Normal rate, regular rhythm and normal heart sounds.   Pulmonary/Chest: Effort normal and breath sounds normal.  Abdominal: Soft. Bowel sounds are normal. There is no tenderness.  Musculoskeletal: Normal range of motion.  Neurological: She is alert and oriented to person, place, and time.  Skin: Skin is warm and dry.  Psychiatric: She has a normal mood and affect.    Assessment/Plan: 1. Adjustment disorder with mixed anxiety and depressed mood -PHQSADS is high for somatic symptoms; severe for anxiety, and moderate-severe for depressive symptoms  -will check TFTs today noting 11 lb weight loss since May, increased anxiety, and difficulty sleeping to r/o other eitiologies.  -See BH note for today's session; she was open to being referred to community therapist for continued  support -discussed remeron as this may help with the above symptoms; reviewed BBW -will send in Remeron pending labs   2. Breakthrough bleeding on Nexplanon -continue OCPs; discussed importance of not skipping pills   3. Weight loss As per above - TSH - T4, free  4. Routine screening for STI (sexually transmitted infection) Will screen to r/o infection and per patient request  - GC/Chlamydia Probe Amp - HIV antibody (with reflex) - RPR  Follow-up:  Return in about 3 weeks (around 07/26/2016) for medication follow-up.   Medical decision-making:  >25 minutes spent, more than 50% of appointment was spent discussing diagnosis and management of symptoms

## 2016-07-05 NOTE — BH Specialist Note (Addendum)
Primary Care Provider: [name], MD  Referring Provider: Dierdre Harness, FNP Session Time:  2122 - 4825 (22 MIN) Type of Service: [place] Interpreter: No.  Interpreter Name & Language: N/A # [facility] Visits July 2017-June 2018: 1ST   PRESENTING CONCERNS:  Angela Branch is a 20 y.o. female brought in by patient. Angela Branch was referred to [facility] for mood concerns, difficulty sleeping & sudden weight loss.  Angela Branch presented for a follow up with C. Millican, FNP.  GOALS ADDRESSED:  Increase use of positive coping skills to decrease anxiety.   INTERVENTIONS:  Introduced [facility] role within integrate care team Mindfulness activity Provided written information on anxiety & positive coping skills   ASSESSMENT/OUTCOME:  Angela Branch presented to be casually dressed with a labile affect.  [name] reported multiple stressors, mostly around current relationship.  She continues to have difficulty sleeping.  She reported her motivation to change and try counseling is her son and being able to stay calm around him.  Angela Branch actively participated in a mindfulness activity during the visit.  She was open to counseling referral.  She agreed to a follow up visit with [facility] on 07/18/16.   TREATMENT PLAN:  Referral for ongoing psycho therapy (DBT or CBT) - Possibly [facility] due to therapist with DBT training  Review information on anxiety & practice one mindfulness activity.  PLAN FOR NEXT VISIT: Relaxation activity/Visualization Review options for referral/treatment. Medication assessment - may start Remeron per Steward Ros, FNP.   Scheduled next visit: 07/18/16  [facility]

## 2016-07-06 LAB — HIV ANTIBODY (ROUTINE TESTING W REFLEX): HIV: NONREACTIVE

## 2016-07-06 LAB — RPR

## 2016-07-08 LAB — GC/CHLAMYDIA PROBE AMP
CT Probe RNA: NOT DETECTED
GC PROBE AMP APTIMA: NOT DETECTED

## 2016-07-09 ENCOUNTER — Telehealth: Payer: Self-pay | Admitting: Family

## 2016-07-09 ENCOUNTER — Telehealth: Payer: Self-pay | Admitting: Pediatrics

## 2016-07-09 MED ORDER — MIRTAZAPINE 15 MG PO TABS: 15.0000 mg | ORAL_TABLET | Freq: Every day | ORAL | Status: DC

## 2016-07-09 NOTE — Telephone Encounter (Signed)
TC to patient with lab results. Reviewed TSH and Free T4; will continue to monitor. Will send in for Remeron 15 mg. Reviewed BBW. Has appointment on 7/27 with [facility] - will do a med check at that time.

## 2016-07-09 NOTE — Telephone Encounter (Signed)
Please call Angela Branch back with the lab result. She stated she need to get a call back ASAP. [phone].

## 2016-07-12 ENCOUNTER — Encounter: Payer: Self-pay | Admitting: Family

## 2016-07-12 ENCOUNTER — Encounter: Payer: Self-pay | Admitting: Pediatrics

## 2016-07-12 ENCOUNTER — Other Ambulatory Visit: Payer: Self-pay | Admitting: Family

## 2016-07-12 ENCOUNTER — Telehealth: Payer: Self-pay | Admitting: Family

## 2016-07-12 ENCOUNTER — Telehealth: Payer: Self-pay | Admitting: *Deleted

## 2016-07-12 DIAGNOSIS — N921 Excessive and frequent menstruation with irregular cycle: Secondary | ICD-10-CM

## 2016-07-12 MED ORDER — NORETHIN ACE-ETH ESTRAD-FE 1-20 MG-MCG PO TABS: 1.0000 | ORAL_TABLET | Freq: Every day | ORAL | Status: DC

## 2016-07-12 NOTE — Telephone Encounter (Signed)
Rx sent 

## 2016-07-12 NOTE — Telephone Encounter (Signed)
VM from pt requesting OCP refill. Pt states that she was seen in clinic last week, but that a rx was not given at that time.

## 2016-07-12 NOTE — Telephone Encounter (Signed)
Received notification from front office manager that patient cursed and hung up on front office staff earlier today. I notified patient and she stated that she did use expletives and hang up the phone; she was notified that letter of dismissal would be sent out to her today.  She verbalized understanding.

## 2016-07-17 ENCOUNTER — Encounter: Payer: Self-pay | Admitting: Family

## 2016-07-18 ENCOUNTER — Encounter: Payer: Self-pay | Admitting: Pediatrics

## 2016-07-18 ENCOUNTER — Ambulatory Visit: Payer: Medicaid Other | Admitting: Clinical

## 2016-07-22 ENCOUNTER — Encounter: Payer: Self-pay | Admitting: Family

## 2016-07-24 ENCOUNTER — Emergency Department (HOSPITAL_COMMUNITY)
Admission: EM | Admit: 2016-07-24 | Discharge: 2016-07-24 | Disposition: A | Discharge status: Home / Self Care | Payer: Medicaid Other | Attending: Emergency Medicine | Admitting: Emergency Medicine

## 2016-07-24 ENCOUNTER — Encounter (HOSPITAL_COMMUNITY): Payer: Self-pay | Admitting: Emergency Medicine

## 2016-07-24 DIAGNOSIS — Y92410 Unspecified street and highway as the place of occurrence of the external cause: Secondary | ICD-10-CM | POA: Insufficient documentation

## 2016-07-24 DIAGNOSIS — N3000 Acute cystitis without hematuria: Secondary | ICD-10-CM | POA: Diagnosis not present

## 2016-07-24 DIAGNOSIS — J45909 Unspecified asthma, uncomplicated: Secondary | ICD-10-CM | POA: Diagnosis not present

## 2016-07-24 DIAGNOSIS — Y9389 Activity, other specified: Secondary | ICD-10-CM | POA: Diagnosis not present

## 2016-07-24 DIAGNOSIS — Z79899 Other long term (current) drug therapy: Secondary | ICD-10-CM | POA: Diagnosis not present

## 2016-07-24 DIAGNOSIS — R51 Headache: Secondary | ICD-10-CM

## 2016-07-24 DIAGNOSIS — S0990XA Unspecified injury of head, initial encounter: Secondary | ICD-10-CM | POA: Diagnosis not present

## 2016-07-24 DIAGNOSIS — Y999 Unspecified external cause status: Secondary | ICD-10-CM | POA: Insufficient documentation

## 2016-07-24 DIAGNOSIS — R519 Headache, unspecified: Secondary | ICD-10-CM

## 2016-07-24 HISTORY — DX: Disorder of thyroid, unspecified: E07.9

## 2016-07-24 LAB — URINALYSIS, ROUTINE W REFLEX MICROSCOPIC
Bilirubin Urine: NEGATIVE
GLUCOSE, UA: NEGATIVE mg/dL
Ketones, ur: NEGATIVE mg/dL
Nitrite: NEGATIVE
Protein, ur: NEGATIVE mg/dL
SPECIFIC GRAVITY, URINE: 1.013 (ref 1.005–1.030)
pH: 6.5 (ref 5.0–8.0)

## 2016-07-24 LAB — I-STAT BETA HCG BLOOD, ED (MC, WL, AP ONLY)

## 2016-07-24 LAB — URINE MICROSCOPIC-ADD ON

## 2016-07-24 MED ORDER — KETOROLAC TROMETHAMINE 60 MG/2ML IM SOLN
60.0000 mg | Freq: Once | INTRAMUSCULAR | Status: AC
  Administered 2016-07-24: 60 mg via INTRAMUSCULAR
  Filled 2016-07-24: qty 2

## 2016-07-24 MED ORDER — ONDANSETRON 8 MG PO TBDP
8.0000 mg | ORAL_TABLET | Freq: Once | ORAL | Status: AC
  Administered 2016-07-24: 8 mg via ORAL
  Filled 2016-07-24: qty 1

## 2016-07-24 MED ORDER — PROCHLORPERAZINE EDISYLATE 5 MG/ML IJ SOLN
10.0000 mg | Freq: Once | INTRAMUSCULAR | Status: AC
  Administered 2016-07-24: 10 mg via INTRAMUSCULAR
  Filled 2016-07-24: qty 2

## 2016-07-24 MED ORDER — CEPHALEXIN 500 MG PO CAPS
500.0000 mg | ORAL_CAPSULE | Freq: Once | ORAL | Status: AC
  Administered 2016-07-24: 500 mg via ORAL
  Filled 2016-07-24: qty 1

## 2016-07-24 MED ORDER — CEPHALEXIN 500 MG PO CAPS: 500.0000 mg | ORAL_CAPSULE | Freq: Three times a day (TID) | ORAL | 0 refills | Status: DC

## 2016-07-24 NOTE — ED Notes (Signed)
Bed: AQ76 Expected date:  Expected time:  Means of arrival:  Comments: Angela Branch c
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
## 2016-07-26 LAB — URINE CULTURE: Culture: <10000 — AB

## 2016-08-02 ENCOUNTER — Ambulatory Visit: Payer: Self-pay | Admitting: Family

## 2016-08-15 ENCOUNTER — Encounter (HOSPITAL_COMMUNITY): Payer: Self-pay | Admitting: *Deleted

## 2016-08-15 ENCOUNTER — Emergency Department (HOSPITAL_COMMUNITY)
Admission: EM | Admit: 2016-08-15 | Discharge: 2016-08-15 | Disposition: A | Discharge status: Home / Self Care | Payer: Medicaid Other | Attending: Emergency Medicine | Admitting: Emergency Medicine

## 2016-08-15 DIAGNOSIS — N939 Abnormal uterine and vaginal bleeding, unspecified: Secondary | ICD-10-CM | POA: Diagnosis present

## 2016-08-15 DIAGNOSIS — N390 Urinary tract infection, site not specified: Secondary | ICD-10-CM | POA: Diagnosis not present

## 2016-08-15 DIAGNOSIS — Z79899 Other long term (current) drug therapy: Secondary | ICD-10-CM | POA: Diagnosis not present

## 2016-08-15 DIAGNOSIS — J45909 Unspecified asthma, uncomplicated: Secondary | ICD-10-CM | POA: Insufficient documentation

## 2016-08-15 DIAGNOSIS — N898 Other specified noninflammatory disorders of vagina: Secondary | ICD-10-CM

## 2016-08-15 LAB — URINE MICROSCOPIC-ADD ON: RBC / HPF: NONE SEEN RBC/hpf (ref 0–5)

## 2016-08-15 LAB — CBC WITH DIFFERENTIAL/PLATELET
Basophils Absolute: 0 10*3/uL (ref 0.0–0.1)
Basophils Relative: 0 %
Eosinophils Absolute: 0.2 10*3/uL (ref 0.0–0.7)
Eosinophils Relative: 2 %
HEMATOCRIT: 44.4 % (ref 36.0–46.0)
HEMOGLOBIN: 14.9 g/dL (ref 12.0–15.0)
LYMPHS ABS: 2.5 10*3/uL (ref 0.7–4.0)
LYMPHS PCT: 30 %
MCH: 29.8 pg (ref 26.0–34.0)
MCHC: 33.6 g/dL (ref 30.0–36.0)
MCV: 88.8 fL (ref 78.0–100.0)
MONOS PCT: 5 %
Monocytes Absolute: 0.4 10*3/uL (ref 0.1–1.0)
NEUTROS ABS: 5.1 10*3/uL (ref 1.7–7.7)
NEUTROS PCT: 63 %
Platelets: 243 10*3/uL (ref 150–400)
RBC: 5 MIL/uL (ref 3.87–5.11)
RDW: 13.5 % (ref 11.5–15.5)
WBC: 8.2 10*3/uL (ref 4.0–10.5)

## 2016-08-15 LAB — URINALYSIS, ROUTINE W REFLEX MICROSCOPIC
BILIRUBIN URINE: NEGATIVE
GLUCOSE, UA: NEGATIVE mg/dL
HGB URINE DIPSTICK: NEGATIVE
Ketones, ur: NEGATIVE mg/dL
NITRITE: NEGATIVE
PH: 6.5 (ref 5.0–8.0)
Protein, ur: NEGATIVE mg/dL
SPECIFIC GRAVITY, URINE: 1.027 (ref 1.005–1.030)

## 2016-08-15 LAB — COMPREHENSIVE METABOLIC PANEL
ALBUMIN: 4.4 g/dL (ref 3.5–5.0)
ALK PHOS: 46 U/L (ref 38–126)
ALT: 14 U/L (ref 14–54)
ANION GAP: 6 (ref 5–15)
AST: 19 U/L (ref 15–41)
BUN: 10 mg/dL (ref 6–20)
CHLORIDE: 105 mmol/L (ref 101–111)
CO2: 26 mmol/L (ref 22–32)
Calcium: 9.7 mg/dL (ref 8.9–10.3)
Creatinine, Ser: 1.01 mg/dL — ABNORMAL HIGH (ref 0.44–1.00)
GFR calc non Af Amer: >60 mL/min (ref >60)
GLUCOSE: 94 mg/dL (ref 65–99)
Potassium: 3.5 mmol/L (ref 3.5–5.1)
SODIUM: 137 mmol/L (ref 135–145)
Total Bilirubin: 1.7 mg/dL — ABNORMAL HIGH (ref 0.3–1.2)
Total Protein: 7.9 g/dL (ref 6.5–8.1)

## 2016-08-15 LAB — WET PREP, GENITAL
Clue Cells Wet Prep HPF POC: NONE SEEN
SPERM: NONE SEEN
TRICH WET PREP: NONE SEEN
Yeast Wet Prep HPF POC: NONE SEEN

## 2016-08-15 LAB — POC URINE PREG, ED: Preg Test, Ur: NEGATIVE

## 2016-08-15 MED ORDER — STERILE WATER FOR INJECTION IJ SOLN
INTRAMUSCULAR | Status: AC
  Administered 2016-08-15: 1 mL
  Filled 2016-08-15: qty 10

## 2016-08-15 MED ORDER — CEFTRIAXONE SODIUM 250 MG IJ SOLR
250.0000 mg | Freq: Once | INTRAMUSCULAR | Status: AC
  Administered 2016-08-15: 250 mg via INTRAMUSCULAR
  Filled 2016-08-15: qty 250

## 2016-08-15 MED ORDER — AZITHROMYCIN 250 MG PO TABS
1000.0000 mg | ORAL_TABLET | Freq: Once | ORAL | Status: AC
  Administered 2016-08-15: 1000 mg via ORAL
  Filled 2016-08-15: qty 4

## 2016-08-15 MED ORDER — SULFAMETHOXAZOLE-TRIMETHOPRIM 800-160 MG PO TABS: 1.0000 | ORAL_TABLET | Freq: Two times a day (BID) | ORAL | 0 refills | Status: AC

## 2016-08-15 NOTE — ED Triage Notes (Signed)
Per pt she had nexplanon placed in July and since has had long periods and abdominal discomfort. Was treated for uti approx 3 weeks ago and is still having some discomfort with urination. Denies fever, pta meds

## 2016-08-15 NOTE — ED Provider Notes (Signed)
[facility] DEPT Provider Note   CSN: [id number] Arrival date & time: 08/15/16  1719     History   Chief Complaint Chief Complaint  Patient presents with  . Dysuria  . Abdominal Pain    HPI Angela Branch is a 20 y.o. female.  HPI Angela Branch is a 20 y.o. female with PMH significant for anemia, anxiety, thyroid disease, and asthmaho presents with gradual onset, persistent, unchanging abnormal vaginal bleeding since having her Nexplanon placed in July, 1 month.  Associated symptoms include nausea, lower abdominal cramping, vaginal discharge, and dysuria.  She states she uses ~10 super tampons/day.  Describes her vaginal bleeding as clots and dark red blood. No fever, chills, vomiting, or diarrhea. She has not tried anything for her symptoms.  No aggravating factors.  She is sexually active with one partner.   Past Medical History:  Diagnosis Date  . Anemia   . Anxiety   . Asthma   . Thyroid disease     Patient Active Problem List   Diagnosis Date Noted  . Adjustment disorder with mixed anxiety and depressed mood 04/29/2016  . GERD (gastroesophageal reflux disease) 09/21/2015  . Eczema 10/04/2014  . Asthma 06/12/2012    Past Surgical History:  Procedure Laterality Date  . TOOTH EXTRACTION      OB History    Gravida Para Term Preterm AB Living   '2 1 1 '$ 0 1 1   SAB TAB Ectopic Multiple Live Births   1 0 0 0 1       Home Medications    Prior to Admission medications   Medication Sig Start Date End Date Taking? Authorizing Provider  albuterol (PROVENTIL HFA;VENTOLIN HFA) 108 (90 Base) MCG/ACT inhaler Inhale 2 puffs into the lungs every 6 (six) hours as needed for wheezing or shortness of breath. 04/11/16 06/11/16  [name], MD  cephALEXin (KEFLEX) 500 MG capsule Take 1 capsule (500 mg total) by mouth 3 (three) times daily. 07/24/16   Jola Schmidt, MD  etonogestrel (NEXPLANON) 68 MG IMPL implant 1 each by Subdermal route once. Placed 05/2016     Historical Provider, MD  FLUoxetine (PROZAC) 20 MG capsule Take 1 capsule (20 mg total) by mouth daily. Patient not taking: Reported on 05/30/2016 04/29/16   Trude Mcburney, FNP  mirtazapine (REMERON) 15 MG tablet Take 1 tablet (15 mg total) by mouth at bedtime. 2/84/13   Dierdre Harness, NP  norelgestromin-ethinyl estradiol (ORTHO EVRA) 150-35 MCG/24HR transdermal patch Place 1 patch onto the skin once a week. Patient not taking: Reported on 05/30/2016 04/29/16   Trude Mcburney, FNP  norethindrone-ethinyl estradiol (JUNEL FE 1/20) 1-20 MG-MCG tablet Take 1 tablet by mouth daily. 2/44/01   Dierdre Harness, NP  ondansetron (ZOFRAN ODT) 8 MG disintegrating tablet Take 1 tablet (8 mg total) by mouth every 8 (eight) hours as needed for nausea or vomiting. Patient not taking: Reported on 07/05/2016 06/11/16   Mercedes Camprubi-Soms, PA-C  ranitidine (ZANTAC) 150 MG tablet Take 1 tablet (150 mg total) by mouth 2 (two) times daily. Patient not taking: Reported on 07/05/2016 06/11/16   Mercedes Camprubi-Soms, PA-C  sulfamethoxazole-trimethoprim (BACTRIM DS,SEPTRA DS) 800-160 MG tablet Take 1 tablet by mouth 2 (two) times daily. 08/15/16 08/22/16  Gloriann Loan, PA-C  triamcinolone ointment (KENALOG) 0.5 % Apply 1 application topically 2 (two) times daily. 0/2/72   Dierdre Harness, NP    Family History Family History  Problem Relation Age of Onset  . Asthma Father   . Diabetes  Maternal Grandmother   . Hyperlipidemia Paternal Grandfather   . Heart disease Paternal Grandfather   . Cancer - Colon Maternal Grandfather   . Mental illness Neg Hx   . Birth defects Neg Hx   . Kidney disease Neg Hx   . Hypertension Neg Hx     Social History Social History  Substance Use Topics  . Smoking status: Never Smoker  . Smokeless tobacco: Never Used  . Alcohol use No     Allergies   Review of patient's allergies indicates no known allergies.   Review of Systems Review of Systems   Physical Exam Updated  Vital Signs BP 110/58 (BP Location: Left Arm)   Pulse 81   Temp 99.1 F (37.3 C) (Oral)   Resp 16   Wt 52 kg   SpO2 98%   BMI 19.69 kg/m   Physical Exam  Constitutional: She is oriented to person, place, and time. She appears well-developed and well-nourished.  Non-toxic appearance. She does not have a sickly appearance. She does not appear ill.  HENT:  Head: Normocephalic and atraumatic.  Mouth/Throat: Oropharynx is clear and moist.  Eyes: Conjunctivae are normal.  Neck: Normal range of motion. Neck supple.  Cardiovascular: Normal rate and regular rhythm.   Pulmonary/Chest: Effort normal and breath sounds normal. No accessory muscle usage or stridor. No respiratory distress. She has no wheezes. She has no rhonchi. She has no rales.  Abdominal: Soft. Bowel sounds are normal. She exhibits no distension. There is no tenderness. There is no rebound and no guarding.  No CVA tenderness.   Genitourinary:  Genitourinary Comments: Chaperone present during exam. Mild white cervical discharge.  No CMT, uterine or adnexal tenderness.  Musculoskeletal: Normal range of motion.  Lymphadenopathy:    She has no cervical adenopathy.  Neurological: She is alert and oriented to person, place, and time.  Speech clear without dysarthria.  Skin: Skin is warm and dry.  Psychiatric: She has a normal mood and affect. Her behavior is normal.     ED Treatments / Results  Labs (all labs ordered are listed, but only abnormal results are displayed) Labs Reviewed  WET PREP, GENITAL - Abnormal; Notable for the following:       Result Value   WBC, Wet Prep HPF POC MANY (*)    All other components within normal limits  COMPREHENSIVE METABOLIC PANEL - Abnormal; Notable for the following:    Creatinine, Ser 1.01 (*)    Total Bilirubin 1.7 (*)    All other components within normal limits  URINALYSIS, ROUTINE W REFLEX MICROSCOPIC (NOT AT [facility]) - Abnormal; Notable for the following:    APPearance CLOUDY (*)     Leukocytes, UA MODERATE (*)    All other components within normal limits  URINE MICROSCOPIC-ADD ON - Abnormal; Notable for the following:    Squamous Epithelial / LPF 0-5 (*)    Bacteria, UA MANY (*)    All other components within normal limits  URINE CULTURE  CBC WITH DIFFERENTIAL/PLATELET  RPR  HIV ANTIBODY (ROUTINE TESTING)  POC URINE PREG, ED  GC/CHLAMYDIA PROBE AMP ([place]) NOT AT [facility]    EKG  EKG Interpretation None       Radiology No results found.  Procedures Procedures (including critical care time)  Medications Ordered in ED Medications  cefTRIAXone (ROCEPHIN) injection 250 mg (not administered)  azithromycin (ZITHROMAX) tablet 1,000 mg (not administered)  sterile water (preservative free) injection (not administered)     Initial Impression / Assessment  and Plan / ED Course  I have reviewed the triage vital signs and the nursing notes.  Pertinent labs & imaging results that were available during my care of the patient were reviewed by me and considered in my medical decision making (see chart for details).  Clinical Course   Patient presents with ongoing persistent vaginal bleeding x 1 month since Nexplanon placement.  Associated discharge and dysuria.  VSS, NAD.  No fevers.  On exam, patient appears non-toxic or septic.  Heart RRR, lungs CTAB, abdomen soft and benign without rebound, guarding, or rigidity. Pelvic exam reveals mild white cervical discharge.  No CMT, uterine, or adnexal tenderness.   DDx: Nexplanon adverse effects, STD, DUB, UTI, cervicitis.  Low suspicion for TOA or PID.  Do not suspect ovarian torsion given duration and presentation of symptoms.  Further imaging at this time is not warranted.  Will obtain labs, UA, wet prep, GC/Chlamydia, HIV, and RPR. UA appears infectious with large leukocytes, 6-30 WBCs, many bacteria.  Culture sent. Will discharge home with Bactrim.  Stable hgb.  No leukocytosis.  No metabolic derangements. Wet prep  with many WBCs.  Patient empirically treated with Rocephin and Azithromycin in ED.  Advised to follow up with [facility] for further evaluation of abnormal bleeding and nexplanon.  Return precautions discussed.  Stable for discharge.   Final Clinical Impressions(s) / ED Diagnoses   Final diagnoses:  Vaginal discharge  UTI (lower urinary tract infection)  Abnormal uterine bleeding    New Prescriptions New Prescriptions   SULFAMETHOXAZOLE-TRIMETHOPRIM (BACTRIM DS,SEPTRA DS) 800-160 MG TABLET    Take 1 tablet by mouth 2 (two) times daily.     Gloriann Loan, PA-C 08/15/16 [place], MD 08/15/16 2038

## 2016-08-15 NOTE — ED Notes (Signed)
Pt well appearing, alert and oriented. Ambulates off unit .

## 2016-08-16 LAB — HIV ANTIBODY (ROUTINE TESTING W REFLEX): HIV SCREEN 4TH GENERATION: NONREACTIVE

## 2016-08-16 LAB — GC/CHLAMYDIA PROBE AMP (~~LOC~~) NOT AT ARMC
CHLAMYDIA, DNA PROBE: NEGATIVE
NEISSERIA GONORRHEA: POSITIVE — AB

## 2016-08-16 LAB — RPR: RPR: NONREACTIVE

## 2016-08-17 LAB — URINE CULTURE: Culture: NO GROWTH

## 2016-08-19 ENCOUNTER — Telehealth (HOSPITAL_BASED_OUTPATIENT_CLINIC_OR_DEPARTMENT_OTHER): Payer: Self-pay | Admitting: Emergency Medicine

## 2016-10-06 ENCOUNTER — Emergency Department (HOSPITAL_COMMUNITY)
Admission: EM | Admit: 2016-10-06 | Discharge: 2016-10-06 | Disposition: A | Discharge status: Home / Self Care | Payer: Medicaid Other | Attending: Emergency Medicine | Admitting: Emergency Medicine

## 2016-10-06 ENCOUNTER — Encounter (HOSPITAL_COMMUNITY): Payer: Self-pay

## 2016-10-06 DIAGNOSIS — R0981 Nasal congestion: Secondary | ICD-10-CM | POA: Diagnosis present

## 2016-10-06 DIAGNOSIS — J069 Acute upper respiratory infection, unspecified: Secondary | ICD-10-CM

## 2016-10-06 DIAGNOSIS — J45909 Unspecified asthma, uncomplicated: Secondary | ICD-10-CM | POA: Insufficient documentation

## 2016-10-06 MED ORDER — OXYMETAZOLINE HCL 0.05 % NA SOLN: 1.0000 | Freq: Two times a day (BID) | NASAL | 0 refills | Status: DC

## 2016-10-06 MED ORDER — BENZONATATE 100 MG PO CAPS: 200.0000 mg | ORAL_CAPSULE | Freq: Two times a day (BID) | ORAL | 0 refills | Status: DC | PRN

## 2016-10-06 NOTE — ED Provider Notes (Signed)
[place] DEPT Provider Note   CSN: [id number] Arrival date & time: 10/06/16  0813     History   Chief Complaint Chief Complaint  Patient presents with  . Nasal Congestion    HPI SAESHA LLERENAS is a 20 y.o. female.  Patient is a 20 year old female with no pertinent past medical history presents the ED with complaint of nasal congestion, onset 2 hours. Patient reports when she woke up this morning she had severe nasal congestion with associated rhinorrhea. She notes over the past few days she has had ear fullness, sore throat, mild intermittent nonproductive cough, sneezing and chills. Denies taking any medications at home for symptoms. She notes she has been around friends over the past week with similar symptoms. Denies fever, headache, neck stiffness, facial swelling, ear drainage, dysphagia, trismus, drooling, shortness of breath, chest pain, wheezing, abdominal pain, vomiting.       Past Medical History:  Diagnosis Date  . Anemia   . Anxiety   . Asthma   . Thyroid disease     Patient Active Problem List   Diagnosis Date Noted  . Adjustment disorder with mixed anxiety and depressed mood 04/29/2016  . GERD (gastroesophageal reflux disease) 09/21/2015  . Eczema 10/04/2014  . Asthma 06/12/2012    Past Surgical History:  Procedure Laterality Date  . TOOTH EXTRACTION      OB History    Gravida Para Term Preterm AB Living   '2 1 1 '$ 0 1 1   SAB TAB Ectopic Multiple Live Births   1 0 0 0 1       Home Medications    Prior to Admission medications   Medication Sig Start Date End Date Taking? Authorizing Provider  albuterol (PROVENTIL HFA;VENTOLIN HFA) 108 (90 Base) MCG/ACT inhaler Inhale 2 puffs into the lungs every 6 (six) hours as needed for wheezing or shortness of breath. 04/11/16 06/11/16  [name], MD  benzonatate (TESSALON) 100 MG capsule Take 2 capsules (200 mg total) by mouth 2 (two) times daily as needed for cough. 10/06/16   Nona Dell, PA-C  cephALEXin (KEFLEX) 500 MG capsule Take 1 capsule (500 mg total) by mouth 3 (three) times daily. 07/24/16   Jola Schmidt, MD  etonogestrel (NEXPLANON) 68 MG IMPL implant 1 each by Subdermal route once. Placed 05/2016    Historical Provider, MD  FLUoxetine (PROZAC) 20 MG capsule Take 1 capsule (20 mg total) by mouth daily. Patient not taking: Reported on 05/30/2016 04/29/16   Trude Mcburney, FNP  mirtazapine (REMERON) 15 MG tablet Take 1 tablet (15 mg total) by mouth at bedtime. 6/76/72   Dierdre Harness, NP  norelgestromin-ethinyl estradiol (ORTHO EVRA) 150-35 MCG/24HR transdermal patch Place 1 patch onto the skin once a week. Patient not taking: Reported on 05/30/2016 04/29/16   Trude Mcburney, FNP  norethindrone-ethinyl estradiol (JUNEL FE 1/20) 1-20 MG-MCG tablet Take 1 tablet by mouth daily. 0/94/70   Dierdre Harness, NP  ondansetron (ZOFRAN ODT) 8 MG disintegrating tablet Take 1 tablet (8 mg total) by mouth every 8 (eight) hours as needed for nausea or vomiting. Patient not taking: Reported on 07/05/2016 06/11/16   Mercedes Camprubi-Soms, PA-C  oxymetazoline (AFRIN NASAL SPRAY) 0.05 % nasal spray Place 1 spray into both nostrils 2 (two) times daily. Place 1 spray in both nostrils twice daily for the next 3 days. Do not use for more than 3 days to prevent rebound rhinorrhea. 10/06/16   Nona Dell, PA-C  ranitidine (ZANTAC) 150 MG  tablet Take 1 tablet (150 mg total) by mouth 2 (two) times daily. Patient not taking: Reported on 07/05/2016 06/11/16   Mercedes Camprubi-Soms, PA-C  triamcinolone ointment (KENALOG) 0.5 % Apply 1 application topically 2 (two) times daily. 04/25/08   Dierdre Harness, NP    Family History Family History  Problem Relation Age of Onset  . Asthma Father   . Diabetes Maternal Grandmother   . Hyperlipidemia Paternal Grandfather   . Heart disease Paternal Grandfather   . Cancer - Colon Maternal Grandfather   . Mental illness Neg Hx   . Birth  defects Neg Hx   . Kidney disease Neg Hx   . Hypertension Neg Hx     Social History Social History  Substance Use Topics  . Smoking status: Never Smoker  . Smokeless tobacco: Never Used  . Alcohol use No     Allergies   Review of patient's allergies indicates no known allergies.   Review of Systems Review of Systems  Constitutional: Positive for chills.  HENT: Positive for congestion, ear pain, rhinorrhea, sneezing and sore throat.   Respiratory: Positive for cough (mild intermittent).   All other systems reviewed and are negative.    Physical Exam Updated Vital Signs BP 112/69 (BP Location: Left Arm)   Pulse 70   Temp 98.5 F (36.9 C) (Oral)   Resp 16   Wt 49.9 kg   LMP 10/03/2016 (Within Days)   SpO2 100%   BMI 18.90 kg/m   Physical Exam  Constitutional: She is oriented to person, place, and time. She appears well-developed and well-nourished. No distress.  HENT:  Head: Normocephalic and atraumatic.  Right Ear: A middle ear effusion is present.  Left Ear: A middle ear effusion is present.  Nose: Rhinorrhea present. Right sinus exhibits no maxillary sinus tenderness and no frontal sinus tenderness. Left sinus exhibits no maxillary sinus tenderness and no frontal sinus tenderness.  Mouth/Throat: Uvula is midline, oropharynx is clear and moist and mucous membranes are normal. No oropharyngeal exudate, posterior oropharyngeal edema, posterior oropharyngeal erythema or tonsillar abscesses. No tonsillar exudate.  Eyes: Conjunctivae and EOM are normal. Pupils are equal, round, and reactive to light. Right eye exhibits no discharge. Left eye exhibits no discharge. No scleral icterus.  Neck: Normal range of motion. Neck supple.  Cardiovascular: Normal rate, regular rhythm, normal heart sounds and intact distal pulses.   Pulmonary/Chest: Effort normal and breath sounds normal. No respiratory distress. She has no wheezes. She has no rales. She exhibits no tenderness.    Abdominal: Soft. Bowel sounds are normal. She exhibits no distension and no mass. There is no tenderness. There is no rebound and no guarding. No hernia.  Musculoskeletal: She exhibits no edema.  Lymphadenopathy:    She has no cervical adenopathy.  Neurological: She is alert and oriented to person, place, and time.  Skin: Skin is warm and dry. She is not diaphoretic.  Nursing note and vitals reviewed.    ED Treatments / Results  Labs (all labs ordered are listed, but only abnormal results are displayed) Labs Reviewed - No data to display  EKG  EKG Interpretation None       Radiology No results found.  Procedures Procedures (including critical care time)  Medications Ordered in ED Medications - No data to display   Initial Impression / Assessment and Plan / ED Course  I have reviewed the triage vital signs and the nursing notes.  Pertinent labs & imaging results that were available during my care  of the patient were reviewed by me and considered in my medical decision making (see chart for details).  Clinical Course    Patient presents with nasal congestion, ear fullness, mild intermittent nonproductive cough, sore throat for the past few days. VSS. Exam revealed rhinorrhea, bilateral ear effusion. Remaining exam unremarkable. Lungs clear to auscultation bilaterally. Patients symptoms are consistent with URI, likely viral etiology. Discussed that antibiotics are not indicated for viral infections. Pt will be discharged with symptomatic treatment.  Verbalizes understanding and is agreeable with plan. Pt is hemodynamically stable & in NAD prior to dc.   Final Clinical Impressions(s) / ED Diagnoses   Final diagnoses:  Viral upper respiratory tract infection    New Prescriptions New Prescriptions   BENZONATATE (TESSALON) 100 MG CAPSULE    Take 2 capsules (200 mg total) by mouth 2 (two) times daily as needed for cough.   OXYMETAZOLINE (AFRIN NASAL SPRAY) 0.05 % NASAL  SPRAY    Place 1 spray into both nostrils 2 (two) times daily. Place 1 spray in both nostrils twice daily for the next 3 days. Do not use for more than 3 days to prevent rebound rhinorrhea.     Chesley Noon [place], [place] 10/06/16 1027    Julianne Rice, MD 10/06/16 1032

## 2016-10-06 NOTE — Discharge Instructions (Signed)
Take your medications as prescribed to help with her symptoms. Continue drinking fluids at home to remain hydrated. You may use a cool mist meter fire to help with her cough or drink warm fluids. To continue taking Tylenol and/or ibuprofen as prescribed over-the-counter as needed for pain relief. Please follow up with a primary care provider from the Resource Guide provided below in 5 days if her symptoms have not improved. Please return to the Emergency Department if symptoms worsen or new onset of fever, chest pain, difficulty breathing, increased worker breathing, coughing up blood, vomiting, and unable to keep fluids down, facial swelling, neck stiffness, headache.

## 2016-10-06 NOTE — ED Notes (Signed)
Declined W/C at D/C and was escorted to lobby by RN. 

## 2016-10-06 NOTE — ED Triage Notes (Signed)
Pt is here reporting nasal congestion and drainage X2 hours. She reports she tried to take a shower to loosen secretions without relief. Pt reports she was unable to sleep due to the congestion.

## 2016-11-25 ENCOUNTER — Encounter (HOSPITAL_COMMUNITY): Payer: Self-pay | Admitting: Emergency Medicine

## 2016-11-25 DIAGNOSIS — N739 Female pelvic inflammatory disease, unspecified: Secondary | ICD-10-CM | POA: Insufficient documentation

## 2016-11-25 DIAGNOSIS — J45909 Unspecified asthma, uncomplicated: Secondary | ICD-10-CM | POA: Diagnosis not present

## 2016-11-25 DIAGNOSIS — N898 Other specified noninflammatory disorders of vagina: Secondary | ICD-10-CM | POA: Diagnosis present

## 2016-11-25 DIAGNOSIS — Z79899 Other long term (current) drug therapy: Secondary | ICD-10-CM | POA: Insufficient documentation
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
## 2016-11-26 ENCOUNTER — Encounter (HOSPITAL_COMMUNITY): Payer: Self-pay | Admitting: Emergency Medicine

## 2016-11-26 ENCOUNTER — Emergency Department (HOSPITAL_COMMUNITY)
Admission: EM | Admit: 2016-11-26 | Discharge: 2016-11-26 | Disposition: A | Discharge status: Home / Self Care | Payer: Medicaid Other | Attending: Emergency Medicine | Admitting: Emergency Medicine

## 2016-11-26 DIAGNOSIS — N730 Acute parametritis and pelvic cellulitis: Secondary | ICD-10-CM

## 2016-11-26 LAB — URINE MICROSCOPIC-ADD ON: RBC / HPF: NONE SEEN RBC/hpf (ref 0–5)

## 2016-11-26 LAB — RAPID HIV SCREEN (HIV 1/2 AB+AG)
HIV 1/2 ANTIBODIES: NONREACTIVE
HIV-1 P24 Antigen - HIV24: NONREACTIVE

## 2016-11-26 LAB — URINALYSIS, ROUTINE W REFLEX MICROSCOPIC
BILIRUBIN URINE: NEGATIVE
Glucose, UA: NEGATIVE mg/dL
Hgb urine dipstick: NEGATIVE
KETONES UR: NEGATIVE mg/dL
NITRITE: NEGATIVE
Protein, ur: NEGATIVE mg/dL
SPECIFIC GRAVITY, URINE: 1.035 — AB (ref 1.005–1.030)
pH: 6 (ref 5.0–8.0)

## 2016-11-26 LAB — GC/CHLAMYDIA PROBE AMP (~~LOC~~) NOT AT ARMC
CHLAMYDIA, DNA PROBE: POSITIVE — AB
NEISSERIA GONORRHEA: POSITIVE — AB

## 2016-11-26 LAB — WET PREP, GENITAL
Clue Cells Wet Prep HPF POC: NONE SEEN
Sperm: NONE SEEN
Trich, Wet Prep: NONE SEEN
Yeast Wet Prep HPF POC: NONE SEEN

## 2016-11-26 LAB — POC URINE PREG, ED: Preg Test, Ur: NEGATIVE

## 2016-11-26 MED ORDER — DOXYCYCLINE HYCLATE 100 MG PO CAPS: 100.0000 mg | ORAL_CAPSULE | Freq: Two times a day (BID) | ORAL | 0 refills | Status: DC

## 2016-11-26 MED ORDER — CEFTRIAXONE SODIUM 250 MG IJ SOLR
250.0000 mg | Freq: Once | INTRAMUSCULAR | Status: AC
  Administered 2016-11-26: 250 mg via INTRAMUSCULAR
  Filled 2016-11-26: qty 250

## 2016-11-26 MED ORDER — AZITHROMYCIN 1 G PO PACK
1.0000 g | PACK | Freq: Once | ORAL | Status: AC
  Administered 2016-11-26: 1 g via ORAL
  Filled 2016-11-26: qty 1

## 2016-11-26 MED ORDER — LIDOCAINE HCL 1 % IJ SOLN
INTRAMUSCULAR | Status: AC
  Filled 2016-11-26: qty 20
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
## 2016-11-26 NOTE — ED Provider Notes (Signed)
[facility] DEPT Provider Note   CSN: [id number] Arrival date & time: 11/25/16  2045  By signing my name below, I, Julien Nordmann, attest that this documentation has been prepared under the direction and in the presence of Melik Blancett, MD.  Electronically Signed: Julien Nordmann, ED Scribe. 11/26/16. 12:38 AM.    History   Chief Complaint Chief Complaint  Patient presents with  . Abdominal Pain    The history is provided by the patient. No language interpreter was used.  Vaginal Discharge   This is a new problem. The current episode started more than 2 days ago. The problem occurs rarely. The problem has not changed since onset.The discharge occurs while at rest. The discharge was yellow. She is not pregnant. She has not missed her period. Associated symptoms include abdominal pain (cramping) and diarrhea. Pertinent negatives include no constipation. She has tried nothing for the symptoms. The treatment provided no relief. Her past medical history is significant for PID.   HPI Comments: Angela Branch is a 20 y.o. female who has a PMhx of PID presents to the Emergency Department complaining of vaginal discharge x 4 days post period. Pt has been having associated lower abdominal cramping and loose stool. She describes the discharge as yellow. Pt notes that she just ended her period 4 days ago and states she has had two periods within one month. She reports that she was on Sprintec for one month and stopped taking them about 1-2 months ago. Pt denies constipation.  Past Medical History:  Diagnosis Date  . Anemia   . Anxiety   . Asthma   . Thyroid disease     Patient Active Problem List   Diagnosis Date Noted  . Adjustment disorder with mixed anxiety and depressed mood 04/29/2016  . GERD (gastroesophageal reflux disease) 09/21/2015  . Eczema 10/04/2014  . Asthma 06/12/2012    Past Surgical History:  Procedure Laterality Date  . TOOTH EXTRACTION      OB History    Gravida Para Term Preterm AB Living   '2 1 1 '$ 0 1 1   SAB TAB Ectopic Multiple Live Births   1 0 0 0 1       Home Medications    Prior to Admission medications   Medication Sig Start Date End Date Taking? Authorizing Provider  albuterol (PROVENTIL HFA;VENTOLIN HFA) 108 (90 Base) MCG/ACT inhaler Inhale 2 puffs into the lungs every 6 (six) hours as needed for wheezing or shortness of breath. 04/11/16 06/11/16  [name], MD  benzonatate (TESSALON) 100 MG capsule Take 2 capsules (200 mg total) by mouth 2 (two) times daily as needed for cough. 10/06/16   Nona Dell, PA-C  cephALEXin (KEFLEX) 500 MG capsule Take 1 capsule (500 mg total) by mouth 3 (three) times daily. 07/24/16   Jola Schmidt, MD  etonogestrel (NEXPLANON) 68 MG IMPL implant 1 each by Subdermal route once. Placed 05/2016    Historical Provider, MD  FLUoxetine (PROZAC) 20 MG capsule Take 1 capsule (20 mg total) by mouth daily. Patient not taking: Reported on 05/30/2016 04/29/16   Trude Mcburney, FNP  mirtazapine (REMERON) 15 MG tablet Take 1 tablet (15 mg total) by mouth at bedtime. 3/81/82   Dierdre Harness, NP  norelgestromin-ethinyl estradiol (ORTHO EVRA) 150-35 MCG/24HR transdermal patch Place 1 patch onto the skin once a week. Patient not taking: Reported on 05/30/2016 04/29/16   Trude Mcburney, FNP  norethindrone-ethinyl estradiol (JUNEL FE 1/20) 1-20 MG-MCG tablet Take 1 tablet  by mouth daily. 9/67/89   Dierdre Harness, NP  ondansetron (ZOFRAN ODT) 8 MG disintegrating tablet Take 1 tablet (8 mg total) by mouth every 8 (eight) hours as needed for nausea or vomiting. Patient not taking: Reported on 07/05/2016 06/11/16   Mercedes Camprubi-Soms, PA-C  oxymetazoline (AFRIN NASAL SPRAY) 0.05 % nasal spray Place 1 spray into both nostrils 2 (two) times daily. Place 1 spray in both nostrils twice daily for the next 3 days. Do not use for more than 3 days to prevent rebound rhinorrhea. 10/06/16   Nona Dell,  PA-C  ranitidine (ZANTAC) 150 MG tablet Take 1 tablet (150 mg total) by mouth 2 (two) times daily. Patient not taking: Reported on 07/05/2016 06/11/16   Mercedes Camprubi-Soms, PA-C  triamcinolone ointment (KENALOG) 0.5 % Apply 1 application topically 2 (two) times daily. 02/27/09   Dierdre Harness, NP    Family History Family History  Problem Relation Age of Onset  . Asthma Father   . Diabetes Maternal Grandmother   . Hyperlipidemia Paternal Grandfather   . Heart disease Paternal Grandfather   . Cancer - Colon Maternal Grandfather   . Mental illness Neg Hx   . Birth defects Neg Hx   . Kidney disease Neg Hx   . Hypertension Neg Hx     Social History Social History  Substance Use Topics  . Smoking status: Never Smoker  . Smokeless tobacco: Never Used  . Alcohol use No     Allergies   Patient has no known allergies.   Review of Systems Review of Systems  Gastrointestinal: Positive for abdominal pain (cramping) and diarrhea. Negative for constipation.  Genitourinary: Positive for vaginal discharge.  All other systems reviewed and are negative.    Physical Exam Updated Vital Signs BP 100/62 (BP Location: Right Arm)   Pulse 99   Temp 98.4 F (36.9 C) (Oral)   Resp 20   Ht $R'5\' 3"'vE$  (1.6 m)   Wt 119 lb (54 kg)   LMP 11/20/2016 (Approximate)   SpO2 99%   BMI 21.08 kg/m   Physical Exam  Constitutional: She is oriented to person, place, and time. She appears well-developed and well-nourished.  HENT:  Head: Normocephalic and atraumatic.  Mouth/Throat: Oropharynx is clear and moist. No oropharyngeal exudate.  Moist mucus membranes  Eyes: Conjunctivae and EOM are normal. Pupils are equal, round, and reactive to light.  Neck: Normal range of motion. Neck supple. No JVD present. No tracheal deviation present.  No carotid bruits. Trachea midline.   Cardiovascular: Normal rate, regular rhythm and normal heart sounds.  Exam reveals no gallop and no friction rub.   No murmur  heard. RRR.   Pulmonary/Chest: Effort normal and breath sounds normal. No stridor. No respiratory distress. She has no wheezes. She has no rales.  Lungs CTA bilaterally.   Abdominal: Soft. She exhibits no distension and no mass. There is no rebound and no guarding.  Hyperactive bowel sounds  Genitourinary: Vaginal discharge found.  Genitourinary Comments: Chaperone present: scant yellow discharge, no adnexal tenderness, no cervical motion tenderness  Musculoskeletal: Normal range of motion.  Lymphadenopathy:    She has no cervical adenopathy.  Neurological: She is alert and oriented to person, place, and time. She has normal reflexes.  2+ lower DTRs, bilaterally DP intact  Skin: Skin is warm and dry. Capillary refill takes less than 2 seconds.  Psychiatric: She has a normal mood and affect.  Nursing note and vitals reviewed.    ED Treatments / Results  Vitals:   11/25/16 2124 11/26/16 0140  BP: 100/62 102/62  Pulse: 99 73  Resp: 20 18  Temp: 98.4 F (36.9 C)     DIAGNOSTIC STUDIES: Oxygen Saturation is 99% on RA, normal by my interpretation.  COORDINATION OF CARE:  12:27 AM Discussed treatment plan with pt at bedside and pt agreed to plan.  Results for orders placed or performed during the hospital encounter of 11/26/16  Wet prep, genital  Result Value Ref Range   Yeast Wet Prep HPF POC NONE SEEN NONE SEEN   Trich, Wet Prep NONE SEEN NONE SEEN   Clue Cells Wet Prep HPF POC NONE SEEN NONE SEEN   WBC, Wet Prep HPF POC MANY (A) NONE SEEN   Sperm NONE SEEN   Urinalysis, Routine w reflex microscopic (not at [facility])  Result Value Ref Range   Color, Urine YELLOW YELLOW   APPearance CLOUDY (A) CLEAR   Specific Gravity, Urine 1.035 (H) 1.005 - 1.030   pH 6.0 5.0 - 8.0   Glucose, UA NEGATIVE NEGATIVE mg/dL   Hgb urine dipstick NEGATIVE NEGATIVE   Bilirubin Urine NEGATIVE NEGATIVE   Ketones, ur NEGATIVE NEGATIVE mg/dL   Protein, ur NEGATIVE NEGATIVE mg/dL   Nitrite NEGATIVE  NEGATIVE   Leukocytes, UA MODERATE (A) NEGATIVE  Rapid HIV screen (HIV 1/2 Ab+Ag)  Result Value Ref Range   HIV-1 P24 Antigen - HIV24 NON REACTIVE NON REACTIVE   HIV 1/2 Antibodies NON REACTIVE NON REACTIVE   Interpretation (HIV Ag Ab)      A non reactive test result means that HIV 1 or HIV 2 antibodies and HIV 1 p24 antigen were not detected in the specimen.  Urine microscopic-add on  Result Value Ref Range   Squamous Epithelial / LPF 6-30 (A) NONE SEEN   WBC, UA TOO NUMEROUS TO COUNT 0 - 5 WBC/hpf   RBC / HPF NONE SEEN 0 - 5 RBC/hpf   Bacteria, UA MANY (A) NONE SEEN   Urine-Other MUCOUS PRESENT   POC Urine Pregnancy, ED (do NOT order at [facility])  Result Value Ref Range   Preg Test, Ur NEGATIVE NEGATIVE   No results found.  Procedures Procedures (including critical care time)   Medications  lidocaine (XYLOCAINE) 1 % (with pres) injection (not administered)  cefTRIAXone (ROCEPHIN) injection 250 mg (250 mg Intramuscular Given 11/26/16 0222)  azithromycin (ZITHROMAX) powder 1 g (1 g Oral Given 11/26/16 0222)     Final Clinical Impressions(s) / ED Diagnoses  PID:  Will treat with doxycycline.  No sexual activity until 7 days after all partners treated   I personally performed the services described in this documentation, which was scribed in my presence. The recorded information has been reviewed and is accurate.      Veatrice Kells, MD 11/26/16 [phone]

## 2017-01-16 ENCOUNTER — Encounter (HOSPITAL_COMMUNITY): Payer: Self-pay

## 2017-01-16 ENCOUNTER — Emergency Department (HOSPITAL_COMMUNITY)
Admission: EM | Admit: 2017-01-16 | Discharge: 2017-01-16 | Disposition: A | Discharge status: Home / Self Care | Payer: Medicaid Other | Attending: Emergency Medicine | Admitting: Emergency Medicine

## 2017-01-16 DIAGNOSIS — J45909 Unspecified asthma, uncomplicated: Secondary | ICD-10-CM | POA: Diagnosis not present

## 2017-01-16 DIAGNOSIS — B9689 Other specified bacterial agents as the cause of diseases classified elsewhere: Secondary | ICD-10-CM | POA: Insufficient documentation

## 2017-01-16 DIAGNOSIS — Z79899 Other long term (current) drug therapy: Secondary | ICD-10-CM | POA: Insufficient documentation

## 2017-01-16 DIAGNOSIS — R1030 Lower abdominal pain, unspecified: Secondary | ICD-10-CM | POA: Diagnosis present

## 2017-01-16 DIAGNOSIS — N72 Inflammatory disease of cervix uteri: Secondary | ICD-10-CM

## 2017-01-16 DIAGNOSIS — N760 Acute vaginitis: Secondary | ICD-10-CM | POA: Diagnosis not present

## 2017-01-16 LAB — WET PREP, GENITAL
SPERM: NONE SEEN
TRICH WET PREP: NONE SEEN
YEAST WET PREP: NONE SEEN

## 2017-01-16 LAB — HIV ANTIBODY (ROUTINE TESTING W REFLEX): HIV Screen 4th Generation wRfx: NONREACTIVE

## 2017-01-16 LAB — URINALYSIS, ROUTINE W REFLEX MICROSCOPIC
BILIRUBIN URINE: NEGATIVE
Glucose, UA: NEGATIVE mg/dL
Hgb urine dipstick: NEGATIVE
Ketones, ur: 5 mg/dL — AB
Leukocytes, UA: NEGATIVE
NITRITE: NEGATIVE
PROTEIN: NEGATIVE mg/dL
SPECIFIC GRAVITY, URINE: 1.021 (ref 1.005–1.030)
pH: 7 (ref 5.0–8.0)

## 2017-01-16 LAB — I-STAT BETA HCG BLOOD, ED (MC, WL, AP ONLY): I-stat hCG, quantitative: <5 m[IU]/mL (ref <5)

## 2017-01-16 LAB — GC/CHLAMYDIA PROBE AMP (~~LOC~~) NOT AT ARMC
Chlamydia: NEGATIVE
Neisseria Gonorrhea: POSITIVE — AB

## 2017-01-16 LAB — RPR: RPR: NONREACTIVE

## 2017-01-16 MED ORDER — AZITHROMYCIN 250 MG PO TABS: 1000.0000 mg | ORAL_TABLET | Freq: Once | ORAL | Status: DC

## 2017-01-16 MED ORDER — METRONIDAZOLE 500 MG PO TABS: 500.0000 mg | ORAL_TABLET | Freq: Once | ORAL | Status: DC

## 2017-01-16 MED ORDER — METRONIDAZOLE 500 MG PO TABS: 500.0000 mg | ORAL_TABLET | Freq: Two times a day (BID) | ORAL | 0 refills | Status: DC

## 2017-01-16 MED ORDER — CEFTRIAXONE SODIUM 250 MG IJ SOLR: 250.0000 mg | Freq: Once | INTRAMUSCULAR | Status: DC

## 2017-01-16 MED ORDER — IBUPROFEN 800 MG PO TABS: 800.0000 mg | ORAL_TABLET | Freq: Three times a day (TID) | ORAL | 0 refills | Status: DC | PRN

## 2017-01-16 NOTE — ED Triage Notes (Signed)
Pt here for uterus pain, sts has 2 periods a month, and reports positive pregnancy test.

## 2017-01-16 NOTE — ED Provider Notes (Signed)
TIME SEEN: 5:50 AM  CHIEF COMPLAINT: Vaginal discharge, lower abdominal pain  HPI: Pt is a 21 y.o. female who complains of abnormal vaginal discharge and lower abdominal pain intermittent leg. Reports symptoms have been ongoing 2 months. States that she was treated for UTI, gonorrhea and chlamydia 2 months ago. Since that time she has had irregular menstrual cycles that she states her every 2 weeks. Her last menstrual period was 3 weeks ago. She states she was sexually active with one new partner and did not use protection. States that her vaginal discharge "smells bad". No dysuria hematuria. No fevers, chills, nausea, vomiting or diarrhea.  ROS: See HPI Constitutional: no fever  Eyes: no drainage  ENT: no runny nose   Cardiovascular:  no chest pain  Resp: no SOB  GI: no vomiting GU: no dysuria Integumentary: no rash  Allergy: no hives  Musculoskeletal: no leg swelling  Neurological: no slurred speech ROS otherwise negative  PAST MEDICAL HISTORY/PAST SURGICAL HISTORY:  Past Medical History:  Diagnosis Date  . Anemia   . Anxiety   . Asthma   . Thyroid disease     MEDICATIONS:  Prior to Admission medications   Medication Sig Start Date End Date Taking? Authorizing Provider  albuterol (PROVENTIL HFA;VENTOLIN HFA) 108 (90 Base) MCG/ACT inhaler Inhale 2 puffs into the lungs every 6 (six) hours as needed for wheezing or shortness of breath. Patient not taking: Reported on 01/16/2017 04/11/16 01/16/17  [name], MD  benzonatate (TESSALON) 100 MG capsule Take 2 capsules (200 mg total) by mouth 2 (two) times daily as needed for cough. Patient not taking: Reported on 11/26/2016 10/06/16   Nona Dell, PA-C  cephALEXin (KEFLEX) 500 MG capsule Take 1 capsule (500 mg total) by mouth 3 (three) times daily. Patient not taking: Reported on 11/26/2016 07/24/16   Jola Schmidt, MD  doxycycline (VIBRAMYCIN) 100 MG capsule Take 1 capsule (100 mg total) by mouth 2 (two) times daily. One  po bid Patient not taking: Reported on 01/16/2017 11/26/16   April Palumbo, MD  FLUoxetine (PROZAC) 20 MG capsule Take 1 capsule (20 mg total) by mouth daily. Patient not taking: Reported on 05/30/2016 04/29/16   Trude Mcburney, FNP  mirtazapine (REMERON) 15 MG tablet Take 1 tablet (15 mg total) by mouth at bedtime. Patient not taking: Reported on 11/26/2016 7/78/24   Dierdre Harness, NP  norelgestromin-ethinyl estradiol (ORTHO EVRA) 150-35 MCG/24HR transdermal patch Place 1 patch onto the skin once a week. Patient not taking: Reported on 05/30/2016 04/29/16   Trude Mcburney, FNP  norethindrone-ethinyl estradiol (JUNEL FE 1/20) 1-20 MG-MCG tablet Take 1 tablet by mouth daily. Patient not taking: Reported on 11/26/2016 2/35/36   Dierdre Harness, NP  ondansetron (ZOFRAN ODT) 8 MG disintegrating tablet Take 1 tablet (8 mg total) by mouth every 8 (eight) hours as needed for nausea or vomiting. Patient not taking: Reported on 07/05/2016 06/11/16   Patty Sermons Street, PA-C  oxymetazoline (AFRIN NASAL SPRAY) 0.05 % nasal spray Place 1 spray into both nostrils 2 (two) times daily. Place 1 spray in both nostrils twice daily for the next 3 days. Do not use for more than 3 days to prevent rebound rhinorrhea. Patient not taking: Reported on 11/26/2016 10/06/16   Nona Dell, PA-C  ranitidine (ZANTAC) 150 MG tablet Take 1 tablet (150 mg total) by mouth 2 (two) times daily. Patient not taking: Reported on 07/05/2016 06/11/16   Patty Sermons Street, PA-C  triamcinolone ointment (KENALOG) 0.5 % Apply 1  application topically 2 (two) times daily. Patient not taking: Reported on 11/26/2016 08/23/46   Dierdre Harness, NP    ALLERGIES:  No Known Allergies  SOCIAL HISTORY:  Social History  Substance Use Topics  . Smoking status: Never Smoker  . Smokeless tobacco: Never Used  . Alcohol use No    FAMILY HISTORY: Family History  Problem Relation Age of Onset  . Asthma Father   . Diabetes Maternal  Grandmother   . Hyperlipidemia Paternal Grandfather   . Heart disease Paternal Grandfather   . Cancer - Colon Maternal Grandfather   . Mental illness Neg Hx   . Birth defects Neg Hx   . Kidney disease Neg Hx   . Hypertension Neg Hx     EXAM: BP 114/74 (BP Location: Left Arm)   Pulse 93   Temp 98.8 F (37.1 C) (Oral)   Resp 14   Ht $R'5\' 3"'Hg$  (1.6 m)   Wt 120 lb (54.4 kg)   LMP 01/01/2017 (Approximate)   SpO2 100%   BMI 21.26 kg/m  CONSTITUTIONAL: Alert and oriented and responds appropriately to questions. Well-appearing; well-nourished HEAD: Normocephalic EYES: Conjunctivae clear, PERRL, EOMI ENT: normal nose; no rhinorrhea; moist mucous membranes NECK: Supple, no meningismus, no nuchal rigidity, no LAD  CARD: RRR; S1 and S2 appreciated; no murmurs, no clicks, no rubs, no gallops RESP: Normal chest excursion without splinting or tachypnea; breath sounds clear and equal bilaterally; no wheezes, no rhonchi, no rales, no hypoxia or respiratory distress, speaking full sentences ABD/GI: Normal bowel sounds; non-distended; soft, non-tender, no rebound, no guarding, no peritoneal signs, no hepatosplenomegaly GU:  Normal external genitalia. No lesions, rashes noted. Patient has no vaginal bleeding on exam. Moderate amount of thin white, yellow vaginal discharge.  No adnexal tenderness, mass or fullness, no cervical motion tenderness. Cervix is friable but noninflamed without masses.  Chaperone present for exam. BACK:  The back appears normal and is non-tender to palpation, there is no CVA tenderness EXT: Normal ROM in all joints; non-tender to palpation; no edema; normal capillary refill; no cyanosis, no calf tenderness or swelling    SKIN: Normal color for age and race; warm; no rash NEURO: Moves all extremities equally, sensation to light touch intact diffusely, cranial nerves II through XII intact, normal speech PSYCH: The patient's mood and manner are appropriate. Grooming and personal  hygiene are appropriate.  MEDICAL DECISION MAKING: Patient here with complaints of irregular menstrual cycles, vaginal discharge with foul odor and pain. Abdominal exam benign. No adnexal tenderness or fullness, no cervical motion tenderness. Has had a friable cervix with white, yellow discharge. Will treat for cervicitis with azithromycin and ceftriaxone given recent exposure to new partner with unprotected sex. Requesting STD testing today. We'll send HIV, syphilis, gonorrhea, Chlamydia and hepatitis panel. Pregnancy test negative. Wet prep is positive for clue cells. We'll treat with Flagyl for one week. Urine shows no sign of infection. Doubt appendicitis given her benign exam. Doubt PID, TOA given benign exam. Given outpatient OB/GYN follow-up. Discussed return precautions.  At this time, I do not feel there is any life-threatening condition present. I have reviewed and discussed all results (EKG, imaging, lab, urine as appropriate) and exam findings with patient/family. I have reviewed nursing notes and appropriate previous records.  I feel the patient is safe to be discharged home without further emergent workup and can continue workup as an outpatient as needed. Discussed usual and customary return precautions. Patient/family verbalize understanding and are comfortable with this plan.  Outpatient  follow-up has been provided. All questions have been answered.       [place], DO 01/16/17 [phone]

## 2017-01-16 NOTE — Discharge Instructions (Signed)
°  [facility] [email or web address] [place] [address] [place], [place] [phone]    [email or web address] [address] [place], [place] ([place]) [place] [address] [place], [place] [phone]   [email or web address] [address] [place], [place] [phone]   [facility] [email or web address] [address] [place], [place] [phone]   [facility] [email or web address] [phone]

## 2017-01-16 NOTE — ED Notes (Signed)
The pt has removed her bp cuff and her pulse ox and is on her cell phone

## 2017-01-16 NOTE — ED Notes (Signed)
The pt is c/o abd pain for one month.  She is just tired of it no n v or diarrhea  She was treated for gc one month ago and she has had this pain since.  Alert no distress

## 2017-01-17 LAB — HEPATITIS PANEL, ACUTE
HCV Ab: <0.1 s/co ratio (ref 0.0–0.9)
Hep A IgM: NEGATIVE
Hep B C IgM: NEGATIVE
Hepatitis B Surface Ag: NEGATIVE

## 2017-02-03 DIAGNOSIS — B349 Viral infection, unspecified: Secondary | ICD-10-CM | POA: Diagnosis not present

## 2017-05-13 DIAGNOSIS — F411 Generalized anxiety disorder: Secondary | ICD-10-CM | POA: Diagnosis not present

## 2017-05-13 DIAGNOSIS — R946 Abnormal results of thyroid function studies: Secondary | ICD-10-CM | POA: Diagnosis not present

## 2017-05-13 DIAGNOSIS — J4520 Mild intermittent asthma, uncomplicated: Secondary | ICD-10-CM | POA: Diagnosis not present

## 2017-05-13 DIAGNOSIS — J302 Other seasonal allergic rhinitis: Secondary | ICD-10-CM | POA: Diagnosis not present

## 2017-10-13 ENCOUNTER — Encounter (HOSPITAL_COMMUNITY): Payer: Self-pay | Admitting: Emergency Medicine

## 2017-10-13 DIAGNOSIS — N760 Acute vaginitis: Secondary | ICD-10-CM | POA: Insufficient documentation

## 2017-10-13 DIAGNOSIS — Z202 Contact with and (suspected) exposure to infections with a predominantly sexual mode of transmission: Secondary | ICD-10-CM | POA: Insufficient documentation

## 2017-10-13 DIAGNOSIS — J45909 Unspecified asthma, uncomplicated: Secondary | ICD-10-CM | POA: Insufficient documentation

## 2017-10-13 DIAGNOSIS — N939 Abnormal uterine and vaginal bleeding, unspecified: Secondary | ICD-10-CM | POA: Insufficient documentation

## 2017-10-13 NOTE — ED Triage Notes (Signed)
Patient reports taking pregnancy test last Sunday due to being a month late. States it was a faint line, but she started having vaginal bleeding and cramping on Monday. Pt reports a vaginal odor. Patient adds that her sexual partner told her he has herpes and she would like to be checked for STDs.

## 2017-10-14 ENCOUNTER — Emergency Department (HOSPITAL_COMMUNITY)
Admission: EM | Admit: 2017-10-14 | Discharge: 2017-10-14 | Disposition: A | Discharge status: Home / Self Care | Payer: Medicaid Other | Attending: Emergency Medicine | Admitting: Emergency Medicine

## 2017-10-14 DIAGNOSIS — Z711 Person with feared health complaint in whom no diagnosis is made: Secondary | ICD-10-CM

## 2017-10-14 DIAGNOSIS — N939 Abnormal uterine and vaginal bleeding, unspecified: Secondary | ICD-10-CM

## 2017-10-14 DIAGNOSIS — B9689 Other specified bacterial agents as the cause of diseases classified elsewhere: Secondary | ICD-10-CM

## 2017-10-14 DIAGNOSIS — N760 Acute vaginitis: Secondary | ICD-10-CM

## 2017-10-14 LAB — HIV ANTIBODY (ROUTINE TESTING W REFLEX): HIV SCREEN 4TH GENERATION: NONREACTIVE

## 2017-10-14 LAB — GC/CHLAMYDIA PROBE AMP (~~LOC~~) NOT AT ARMC
Chlamydia: NEGATIVE
NEISSERIA GONORRHEA: NEGATIVE

## 2017-10-14 LAB — WET PREP, GENITAL
Sperm: NONE SEEN
TRICH WET PREP: NONE SEEN
YEAST WET PREP: NONE SEEN

## 2017-10-14 LAB — RPR: RPR Ser Ql: NONREACTIVE

## 2017-10-14 LAB — I-STAT BETA HCG BLOOD, ED (MC, WL, AP ONLY): I-stat hCG, quantitative: <5 m[IU]/mL (ref <5)

## 2017-10-14 MED ORDER — METRONIDAZOLE 0.75 % VA GEL
1.0000 | Freq: Two times a day (BID) | VAGINAL | 1 refills | Status: DC
Start: 2017-10-14 — End: 2018-02-03

## 2017-10-14 NOTE — ED Provider Notes (Signed)
[facility] DEPT Provider Note   CSN: [id number] Arrival date & time: 10/13/17  1833     History   Chief Complaint Chief Complaint  Patient presents with  . Vaginal Bleeding    HPI Angela Branch is a 21 y.o. female [phone] with a hx of anemia, anxiety presents to the Emergency Department with concerns about pregnancy and STD.  Pt reports she regularly has unprotected sex with a female partner.  He recently told her that he was being treated for Herpes due to an unknown rash.  Pt reports she had a positive pregnancy test last week and then began to have lower abd ramping and vaginal bleeding 8 days ago.  Pt reports the heaviness of her flow varies, but she has not passed any clots.  Pt reports associated nausea without vomiting or diarrhea.  No sick contacts.  Pt denies vaginal discharge, dysuria, urinary frequency and urgency.    The history is provided by the patient and medical records. No language interpreter was used.    Past Medical History:  Diagnosis Date  . Anemia   . Anxiety   . Asthma   . Thyroid disease     Patient Active Problem List   Diagnosis Date Noted  . Adjustment disorder with mixed anxiety and depressed mood 04/29/2016  . GERD (gastroesophageal reflux disease) 09/21/2015  . Eczema 10/04/2014  . Asthma 06/12/2012    Past Surgical History:  Procedure Laterality Date  . TOOTH EXTRACTION      OB History    Gravida Para Term Preterm AB Living   '2 1 1 '$ 0 1 1   SAB TAB Ectopic Multiple Live Births   1 0 0 0 1       Home Medications    Prior to Admission medications   Medication Sig Start Date End Date Taking? Authorizing Provider  ibuprofen (ADVIL,MOTRIN) 800 MG tablet Take 1 tablet (800 mg total) by mouth every 8 (eight) hours as needed for mild pain. Patient not taking: Reported on 10/14/2017 01/16/17   Ward, Delice Bison, DO  metroNIDAZOLE (FLAGYL) 500 MG tablet Take 1 tablet (500 mg total) by mouth 2 (two) times  daily. Do not drink alcohol with this medication. Patient not taking: Reported on 10/14/2017 01/16/17   Ward, Delice Bison, DO  metroNIDAZOLE (METROGEL VAGINAL) 0.75 % vaginal gel Place 1 Applicatorful vaginally 2 (two) times daily. 10/14/17   Kaysha Parsell, Jarrett Soho, PA-C    Family History Family History  Problem Relation Age of Onset  . Asthma Father   . Diabetes Maternal Grandmother   . Hyperlipidemia Paternal Grandfather   . Heart disease Paternal Grandfather   . Cancer - Colon Maternal Grandfather   . Mental illness Neg Hx   . Birth defects Neg Hx   . Kidney disease Neg Hx   . Hypertension Neg Hx     Social History Social History  Substance Use Topics  . Smoking status: Never Smoker  . Smokeless tobacco: Never Used  . Alcohol use No     Allergies   Patient has no known allergies.   Review of Systems Review of Systems  Constitutional: Negative for appetite change, diaphoresis, fatigue, fever and unexpected weight change.  HENT: Negative for mouth sores.   Eyes: Negative for visual disturbance.  Respiratory: Negative for cough, chest tightness, shortness of breath and wheezing.   Cardiovascular: Negative for chest pain.  Gastrointestinal: Positive for abdominal pain ( lower). Negative for constipation, diarrhea, nausea and vomiting.  Endocrine:  Negative for polydipsia, polyphagia and polyuria.  Genitourinary: Positive for vaginal bleeding. Negative for dysuria, frequency, hematuria and urgency.  Musculoskeletal: Negative for back pain and neck stiffness.  Skin: Negative for rash.  Allergic/Immunologic: Negative for immunocompromised state.  Neurological: Negative for syncope, light-headedness and headaches.  Hematological: Does not bruise/bleed easily.  Psychiatric/Behavioral: Negative for sleep disturbance. The patient is not nervous/anxious.      Physical Exam Updated Vital Signs BP (!) 120/92 (BP Location: Left Arm)   Pulse 73   Temp 98.5 F (36.9 C) (Oral)    Resp 18   Ht 5\' 3"  (1.6 m)   Wt 52.2 kg (115 lb)   LMP 10/06/2017   SpO2 100%   BMI 20.37 kg/m   Physical Exam  Constitutional: She appears well-developed and well-nourished. No distress.  HENT:  Head: Normocephalic and atraumatic.  Eyes: Conjunctivae are normal.  Neck: Normal range of motion.  Cardiovascular: Normal rate, regular rhythm, normal heart sounds and intact distal pulses.   No murmur heard. Pulmonary/Chest: Effort normal and breath sounds normal. No respiratory distress. She has no wheezes.  Abdominal: Soft. Bowel sounds are normal. There is tenderness ( minimal) in the suprapubic area. There is no rigidity, no rebound, no guarding and no CVA tenderness. Hernia confirmed negative in the right inguinal area and confirmed negative in the left inguinal area.  Genitourinary: Uterus normal. No labial fusion. There is no rash, tenderness or lesion on the right labia. There is no rash, tenderness or lesion on the left labia. Uterus is not deviated, not enlarged, not fixed and not tender. Cervix exhibits no motion tenderness, no discharge and no friability. Right adnexum displays no mass, no tenderness and no fullness. Left adnexum displays no mass, no tenderness and no fullness. There is bleeding ( small, dark red) in the vagina. No erythema or tenderness in the vagina. No foreign body in the vagina. No signs of injury around the vagina. No vaginal discharge found.  Musculoskeletal: Normal range of motion. She exhibits no edema.  Lymphadenopathy:       Right: No inguinal adenopathy present.       Left: No inguinal adenopathy present.  Neurological: She is alert.  Skin: Skin is warm and dry. She is not diaphoretic. No erythema.  Psychiatric: She has a normal mood and affect.  Nursing note and vitals reviewed.    ED Treatments / Results  Labs (all labs ordered are listed, but only abnormal results are displayed) Labs Reviewed  WET PREP, GENITAL - Abnormal; Notable for the  following:       Result Value   Clue Cells Wet Prep HPF POC PRESENT (*)    WBC, Wet Prep HPF POC MANY (*)    All other components within normal limits  RPR  HIV ANTIBODY (ROUTINE TESTING)  I-STAT BETA HCG BLOOD, ED ([facility], [facility], [facility] ONLY)  GC/CHLAMYDIA PROBE AMP () NOT AT [facility]    Radiology No results found.  Procedures Procedures (including critical care time)  Medications Ordered in ED Medications - No data to display   Initial Impression / Assessment and Plan / ED Course  I have reviewed the triage vital signs and the nursing notes.  Pertinent labs & imaging results that were available during my care of the patient were reviewed by me and considered in my medical decision making (see chart for details).     Patient with multiple concerns. She is not pregnant. Wet prep shows clue cells and white blood cells. Reports she's  had difficulty with recurrent bacterial vaginosis despite multiple rounds of treatment.  She denies all urinary symptoms and does not wish to have a urinalysis performed. Lung discussion about patient's lab work and her concern about potential STDs in the setting of clue cells and white blood cells. I have offered STD treatment. At this time she wishes to wait for definitive testing electing to be treated for the bacterial vaginosis today. I have referred her to OB/GYN for further evaluation and management of her recurrent BV. Patient is afebrile and well-appearing. On repeat exam her abdomen is soft and nontender. Highly doubt surgical etiology. No cervical motion tenderness, no evidence of PID. Discussed reasons to return to the emergency department and patient states understanding. She is in agreement with the plan for discharge home.  Final Clinical Impressions(s) / ED Diagnoses   Final diagnoses:  BV (bacterial vaginosis)  Vaginal bleeding  Concern about STD in female without diagnosis    New Prescriptions New Prescriptions   METRONIDAZOLE  (METROGEL VAGINAL) 0.75 % VAGINAL GEL    Place 1 Applicatorful vaginally 2 (two) times daily.     Janella Rogala, Jarrett Soho, PA-C 10/14/17 0404    Orpah Greek, MD 10/14/17 [phone]

## 2017-10-14 NOTE — Discharge Instructions (Signed)
1. Medications: metrogel, usual home medications 2. Treatment: rest, drink plenty of fluids, switch brand of pads 3. Follow Up: Please followup with your primary doctor/OB/GYN in 5-7 days for discussion of your diagnoses and further evaluation after today's visit; if you do not have a primary care doctor use the resource guide provided to find one; Please return to the ER for fevers, worsening pain, worsening bleeding, persistent vomiting or other concerns

## 2017-10-17 DIAGNOSIS — F4310 Post-traumatic stress disorder, unspecified: Secondary | ICD-10-CM | POA: Insufficient documentation

## 2017-10-17 DIAGNOSIS — K644 Residual hemorrhoidal skin tags: Secondary | ICD-10-CM | POA: Diagnosis not present

## 2017-10-17 DIAGNOSIS — Z113 Encounter for screening for infections with a predominantly sexual mode of transmission: Secondary | ICD-10-CM | POA: Diagnosis not present

## 2017-10-17 DIAGNOSIS — F419 Anxiety disorder, unspecified: Secondary | ICD-10-CM | POA: Insufficient documentation

## 2017-12-23 NOTE — L&D Delivery Note (Addendum)
Patient: PETRONELLA SHUFORD MRN: [id number]  GBS status: Negative, IAP given no   Patient is a 22 y.o. now [id number] s/p NSVD at [redacted]w[redacted]d, who was admitted for Observation for fetal tachycardia. SROM 1h 59m prior to delivery with clear fluid.    Delivery Note At 10:20 AM a viable female was delivered via Vaginal, Spontaneous (Presentation: Middle OA).  APGAR: 4 at 1 minute, 8 at 5 minutes; weight 6 lb 6.3 oz (2900 g).   Placenta status: intact.  Cord: 3 vessel with the following complications: none.  Cord pH: pending   Anesthesia:  Epidural  Episiotomy: None Lacerations: None Suture Repair: None  Est. Blood Loss (mL): 113  Mom to postpartum.  Baby to NICU.   Head delivered middle OA. No nuchal cord present. Shoulder and body delivered in usual fashion. Infant without spontaneous cry, poor tone, placed on mother's abdomen, dried and bulb suctioned. Cord clamped x 2 after 20 second delay, and cut by family member. Taken to the warmer, where NICU team was present. Cord blood drawn. Cord pH obtained.  Placenta delivered spontaneously with gentle cord traction. Fundus firm with massage and Pitocin. Perineum inspected and found to have no lacerations.    Patriciaann Clan 09/22/2018, 10:54 AM  OB FELLOW DELIVERY ATTESTATION  I was gloved and present for the delivery in its entirety, and I agree with the above resident's note.    Phill Myron, D.O. OB Fellow  09/22/2018, 3:36 PM

## 2017-12-25 DIAGNOSIS — N760 Acute vaginitis: Secondary | ICD-10-CM | POA: Diagnosis not present

## 2018-02-02 ENCOUNTER — Encounter (HOSPITAL_COMMUNITY): Payer: Self-pay

## 2018-02-02 ENCOUNTER — Inpatient Hospital Stay (HOSPITAL_COMMUNITY)
Admission: AD | Admit: 2018-02-02 | Discharge: 2018-02-03 | Disposition: A | Discharge status: Home / Self Care | Payer: Medicaid Other | Source: Ambulatory Visit | Attending: Obstetrics & Gynecology | Admitting: Obstetrics & Gynecology

## 2018-02-02 DIAGNOSIS — Z3A01 Less than 8 weeks gestation of pregnancy: Secondary | ICD-10-CM | POA: Insufficient documentation

## 2018-02-02 DIAGNOSIS — J45909 Unspecified asthma, uncomplicated: Secondary | ICD-10-CM | POA: Insufficient documentation

## 2018-02-02 DIAGNOSIS — O99281 Endocrine, nutritional and metabolic diseases complicating pregnancy, first trimester: Secondary | ICD-10-CM | POA: Insufficient documentation

## 2018-02-02 DIAGNOSIS — O23591 Infection of other part of genital tract in pregnancy, first trimester: Secondary | ICD-10-CM | POA: Diagnosis not present

## 2018-02-02 DIAGNOSIS — O3680X0 Pregnancy with inconclusive fetal viability, not applicable or unspecified: Secondary | ICD-10-CM

## 2018-02-02 DIAGNOSIS — O26899 Other specified pregnancy related conditions, unspecified trimester: Secondary | ICD-10-CM

## 2018-02-02 DIAGNOSIS — Z79899 Other long term (current) drug therapy: Secondary | ICD-10-CM | POA: Diagnosis not present

## 2018-02-02 DIAGNOSIS — O99511 Diseases of the respiratory system complicating pregnancy, first trimester: Secondary | ICD-10-CM | POA: Diagnosis not present

## 2018-02-02 DIAGNOSIS — N760 Acute vaginitis: Secondary | ICD-10-CM | POA: Diagnosis not present

## 2018-02-02 DIAGNOSIS — R109 Unspecified abdominal pain: Secondary | ICD-10-CM | POA: Diagnosis not present

## 2018-02-02 DIAGNOSIS — E079 Disorder of thyroid, unspecified: Secondary | ICD-10-CM | POA: Insufficient documentation

## 2018-02-02 DIAGNOSIS — Z8249 Family history of ischemic heart disease and other diseases of the circulatory system: Secondary | ICD-10-CM | POA: Diagnosis not present

## 2018-02-02 DIAGNOSIS — B9689 Other specified bacterial agents as the cause of diseases classified elsewhere: Secondary | ICD-10-CM | POA: Diagnosis present

## 2018-02-02 DIAGNOSIS — Z791 Long term (current) use of non-steroidal anti-inflammatories (NSAID): Secondary | ICD-10-CM | POA: Diagnosis not present

## 2018-02-02 DIAGNOSIS — O26891 Other specified pregnancy related conditions, first trimester: Secondary | ICD-10-CM | POA: Insufficient documentation

## 2018-02-02 DIAGNOSIS — Z833 Family history of diabetes mellitus: Secondary | ICD-10-CM | POA: Diagnosis not present

## 2018-02-02 DIAGNOSIS — Z800 Family history of malignant neoplasm of digestive organs: Secondary | ICD-10-CM | POA: Insufficient documentation

## 2018-02-02 DIAGNOSIS — O99341 Other mental disorders complicating pregnancy, first trimester: Secondary | ICD-10-CM | POA: Insufficient documentation

## 2018-02-02 DIAGNOSIS — Z825 Family history of asthma and other chronic lower respiratory diseases: Secondary | ICD-10-CM | POA: Diagnosis not present

## 2018-02-02 DIAGNOSIS — F419 Anxiety disorder, unspecified: Secondary | ICD-10-CM | POA: Diagnosis not present

## 2018-02-02 LAB — URINALYSIS, ROUTINE W REFLEX MICROSCOPIC
BILIRUBIN URINE: NEGATIVE
GLUCOSE, UA: NEGATIVE mg/dL
HGB URINE DIPSTICK: NEGATIVE
Ketones, ur: NEGATIVE mg/dL
Leukocytes, UA: NEGATIVE
Nitrite: NEGATIVE
Protein, ur: NEGATIVE mg/dL
SPECIFIC GRAVITY, URINE: 1.021 (ref 1.005–1.030)
pH: 6 (ref 5.0–8.0)

## 2018-02-02 LAB — POCT PREGNANCY, URINE: Preg Test, Ur: POSITIVE — AB

## 2018-02-02 NOTE — MAU Note (Signed)
Pt had a positive pregnancy test at home.. Pt has been cramping for 2 weeks. LMP 1/9

## 2018-02-02 NOTE — MAU Note (Signed)
Pt reports 2 +upt at home 1 hour prior to arrival. Pt reporting cramping x2 weeks. Pt denies vag bleeding. States LMP 12/31/2017. Pt has child in triage with her. Pt made aware of current flu restrictions and that someone needs to come get her child. Pt states there is someone to come and watch him, but pt states "he ain't leaving the waiting room with my child or someone will draw my blood work and call me and give me the results over the phone".

## 2018-02-03 ENCOUNTER — Inpatient Hospital Stay (HOSPITAL_COMMUNITY): Payer: Medicaid Other

## 2018-02-03 DIAGNOSIS — B9689 Other specified bacterial agents as the cause of diseases classified elsewhere: Secondary | ICD-10-CM | POA: Diagnosis present

## 2018-02-03 DIAGNOSIS — N760 Acute vaginitis: Secondary | ICD-10-CM | POA: Diagnosis not present

## 2018-02-03 DIAGNOSIS — O26891 Other specified pregnancy related conditions, first trimester: Secondary | ICD-10-CM | POA: Diagnosis not present

## 2018-02-03 LAB — WET PREP, GENITAL
SPERM: NONE SEEN
TRICH WET PREP: NONE SEEN
Yeast Wet Prep HPF POC: NONE SEEN

## 2018-02-03 LAB — CBC
HCT: 39.4 % (ref 36.0–46.0)
HEMOGLOBIN: 13.5 g/dL (ref 12.0–15.0)
MCH: 30.5 pg (ref 26.0–34.0)
MCHC: 34.3 g/dL (ref 30.0–36.0)
MCV: 88.9 fL (ref 78.0–100.0)
PLATELETS: 187 10*3/uL (ref 150–400)
RBC: 4.43 MIL/uL (ref 3.87–5.11)
RDW: 12.8 % (ref 11.5–15.5)
WBC: 10.7 10*3/uL — ABNORMAL HIGH (ref 4.0–10.5)

## 2018-02-03 LAB — GC/CHLAMYDIA PROBE AMP (~~LOC~~) NOT AT ARMC
CHLAMYDIA, DNA PROBE: NEGATIVE
Neisseria Gonorrhea: NEGATIVE

## 2018-02-03 LAB — HCG, QUANTITATIVE, PREGNANCY: HCG, BETA CHAIN, QUANT, S: 159 m[IU]/mL — AB (ref <5)

## 2018-02-03 MED ORDER — METRONIDAZOLE 500 MG PO TABS: 500.0000 mg | ORAL_TABLET | Freq: Two times a day (BID) | ORAL | 0 refills | Status: AC

## 2018-02-03 NOTE — MAU Provider Note (Signed)
History     CSN: [id number]  Arrival date and time: 02/02/18 2118   First Provider Initiated Contact with Patient 02/03/18 0041      Chief Complaint  Patient presents with  . Possible Pregnancy  . Abdominal Pain   HPI  Ms.  Angela Branch is a 22 y.o. year old G64P1011 female at 4.[redacted] weeks gestation by LMP who presents to MAU reporting abdominal cramping x 2 wks and 2 (+) HPT 1 hour prior to arrival at hospital. LMP was 12/31/17. Denies VB.  Past Medical History:  Diagnosis Date  . Anemia   . Anxiety   . Asthma   . Thyroid disease     Past Surgical History:  Procedure Laterality Date  . TOOTH EXTRACTION      Family History  Problem Relation Age of Onset  . Asthma Father   . Diabetes Maternal Grandmother   . Hyperlipidemia Paternal Grandfather   . Heart disease Paternal Grandfather   . Cancer - Colon Maternal Grandfather   . Mental illness Neg Hx   . Birth defects Neg Hx   . Kidney disease Neg Hx   . Hypertension Neg Hx     Social History   Tobacco Use  . Smoking status: Never Smoker  . Smokeless tobacco: Never Used  Substance Use Topics  . Alcohol use: No    Alcohol/week: 0.0 oz  . Drug use: No    Allergies: No Known Allergies  Medications Prior to Admission  Medication Sig Dispense Refill Last Dose  . ALBUTEROL IN Inhale into the lungs.   Past Month at Unknown time  . ibuprofen (ADVIL,MOTRIN) 800 MG tablet Take 1 tablet (800 mg total) by mouth every 8 (eight) hours as needed for mild pain. (Patient not taking: Reported on 10/14/2017) 30 tablet 0 Completed Course at Unknown time  . metroNIDAZOLE (FLAGYL) 500 MG tablet Take 1 tablet (500 mg total) by mouth 2 (two) times daily. Do not drink alcohol with this medication. (Patient not taking: Reported on 10/14/2017) 14 tablet 0 Completed Course at Unknown time  . metroNIDAZOLE (METROGEL VAGINAL) 0.75 % vaginal gel Place 1 Applicatorful vaginally 2 (two) times daily. 70 g 1     Review of Systems   Constitutional: Negative.   HENT: Negative.   Eyes: Negative.   Respiratory: Negative.   Cardiovascular: Negative.   Gastrointestinal: Negative.   Endocrine: Negative.   Genitourinary: Positive for pelvic pain (cramping).  Musculoskeletal: Negative.   Skin: Negative.   Allergic/Immunologic: Negative.   Neurological: Negative.   Hematological: Negative.   Psychiatric/Behavioral: Negative.    Physical Exam   Blood pressure 108/63, pulse 77, temperature 97.9 F (36.6 C), temperature source Oral, resp. rate 16, height $RemoveBe'5\' 2"'piAoozgfA$  (1.575 m), weight 125 lb (56.7 kg), last menstrual period 12/31/2017, SpO2 100 %.  Physical Exam  Nursing note and vitals reviewed. Constitutional: She is oriented to person, place, and time. She appears well-developed and well-nourished.  HENT:  Head: Normocephalic and atraumatic.  Eyes: Pupils are equal, round, and reactive to light.  Neck: Normal range of motion.  Cardiovascular: Normal rate, regular rhythm and normal heart sounds.  Respiratory: Effort normal and breath sounds normal.  GI: Soft. Bowel sounds are normal.  Genitourinary:  Genitourinary Comments: Uterus: non-tender, SE: cx: smooth, pink, no lesions, moderate amt of thick, white vaginal d/c -- wet prep & GC/CT done, closed/long/firm, no CMT or friability, no adnexal tenderness   Musculoskeletal: Normal range of motion.  Neurological: She is alert and oriented  to person, place, and time.  Skin: Skin is warm and dry.  Psychiatric: She has a normal mood and affect. Her behavior is normal. Judgment and thought content normal.    MAU Course  Procedures  MDM CCUA UPT Wet Prep GC/CT -- pending HIV -- pending  Results for orders placed or performed during the hospital encounter of 02/02/18 (from the past 24 hour(s))  Urinalysis, Routine w reflex microscopic     Status: Abnormal   Collection Time: 02/02/18  9:49 PM  Result Value Ref Range   Color, Urine YELLOW YELLOW   APPearance HAZY (A)  CLEAR   Specific Gravity, Urine 1.021 1.005 - 1.030   pH 6.0 5.0 - 8.0   Glucose, UA NEGATIVE NEGATIVE mg/dL   Hgb urine dipstick NEGATIVE NEGATIVE   Bilirubin Urine NEGATIVE NEGATIVE   Ketones, ur NEGATIVE NEGATIVE mg/dL   Protein, ur NEGATIVE NEGATIVE mg/dL   Nitrite NEGATIVE NEGATIVE   Leukocytes, UA NEGATIVE NEGATIVE  Pregnancy, urine POC     Status: Abnormal   Collection Time: 02/02/18 10:06 PM  Result Value Ref Range   Preg Test, Ur POSITIVE (A) NEGATIVE  Wet prep, genital     Status: Abnormal   Collection Time: 02/03/18 12:47 AM  Result Value Ref Range   Yeast Wet Prep HPF POC NONE SEEN NONE SEEN   Trich, Wet Prep NONE SEEN NONE SEEN   Clue Cells Wet Prep HPF POC PRESENT (A) NONE SEEN   WBC, Wet Prep HPF POC MANY (A) NONE SEEN   Sperm NONE SEEN   CBC     Status: Abnormal   Collection Time: 02/03/18  1:06 AM  Result Value Ref Range   WBC 10.7 (H) 4.0 - 10.5 K/uL   RBC 4.43 3.87 - 5.11 MIL/uL   Hemoglobin 13.5 12.0 - 15.0 g/dL   HCT 29.4 26.2 - 70.0 %   MCV 88.9 78.0 - 100.0 fL   MCH 30.5 26.0 - 34.0 pg   MCHC 34.3 30.0 - 36.0 g/dL   RDW 48.4 98.6 - 51.6 %   Platelets 187 150 - 400 K/uL  hCG, quantitative, pregnancy     Status: Abnormal   Collection Time: 02/03/18  1:06 AM  Result Value Ref Range   hCG, Beta Chain, Quant, S 159 (H) <5 mIU/mL    US Ob Comp Less 14 Wks  Result Date: 02/03/2018 CLINICAL DATA:  22 y/o F; two weeks of abdominal cramping. Positive urinary pregnancy test. EXAM: OBSTETRIC <14 WK [name] AND TRANSVAGINAL OB US TECHNIQUE: Both transabdominal and transvaginal ultrasound examinations were performed for complete evaluation of the gestation as well as the maternal uterus, adnexal regions, and pelvic cul-de-sac. Transvaginal technique was performed to assess early pregnancy. COMPARISON:  None. FINDINGS: Intrauterine gestational sac: None Yolk sac:  Not Visualized. Embryo:  Not Visualized. Cardiac Activity: Not Visualized. Subchorionic hemorrhage:  None  visualized. Maternal uterus/adnexae: Normal. Small volume simple fluid in the pelvis, likely physiologic. IMPRESSION: Pregnancy of unknown location. Intrauterine pregnancy may not be identified with a beta HCG of less than 3,000. Follow-up beta HCG and possible pelvic ultrasound as indicated is recommended to evaluate pregnancy location. This recommendation follows SRU consensus guidelines: Diagnostic Criteria for Nonviable Pregnancy Early in the First Trimester. Malva Limes Med 2013; 861:0424-73. Electronically Signed   By: Mitzi Hansen M.D.   On: 02/03/2018 01:48   US Ob Transvaginal  Result Date: 02/03/2018 CLINICAL DATA:  23 y/o F; two weeks of abdominal cramping. Positive urinary pregnancy test. EXAM:  OBSTETRIC <14 WK [name] AND TRANSVAGINAL OB US TECHNIQUE: Both transabdominal and transvaginal ultrasound examinations were performed for complete evaluation of the gestation as well as the maternal uterus, adnexal regions, and pelvic cul-de-sac. Transvaginal technique was performed to assess early pregnancy. COMPARISON:  None. FINDINGS: Intrauterine gestational sac: None Yolk sac:  Not Visualized. Embryo:  Not Visualized. Cardiac Activity: Not Visualized. Subchorionic hemorrhage:  None visualized. Maternal uterus/adnexae: Normal. Small volume simple fluid in the pelvis, likely physiologic. IMPRESSION: Pregnancy of unknown location. Intrauterine pregnancy may not be identified with a beta HCG of less than 3,000. Follow-up beta HCG and possible pelvic ultrasound as indicated is recommended to evaluate pregnancy location. This recommendation follows SRU consensus guidelines: Diagnostic Criteria for Nonviable Pregnancy Early in the First Trimester. Alta Corning Med 2013; 419:9144-45. Electronically Signed   By: Kristine Garbe M.D.   On: 02/03/2018 01:48     Assessment and Plan  Bacterial vaginitis - Rx for Flagyl 500 mg BID x 7 days  Abdominal pain in pregnancy - Advised to take Tylenol 1000 mg  every 6 hrs prn pain  Pregnancy of unknown anatomic location - Advised to go to [place] on 02/05/2018 at 11:00 AM for repeat HCG; preparing to stay 1-2 hours for results and to speak to a provider - Information provided on ectopic pregnancy - Advised to return to MAU for increased pain not relieved with Tylenol and/or heavy VB  Discharge home  Patient verbalized an understanding of the plan of care and agrees.   Laury Deep, MSN, CNM 02/03/2018, 12:41 AM

## 2018-02-03 NOTE — Discharge Instructions (Signed)
[place] for [employer] at [facility]       Phone: [phone]  [place] for [employer] at [place] Phone: [place] for [employer] at [place]  Phone: [place] for [place] at [facility]  Phone: [place] for [place] at [place]  Phone: [facility]       Phone: [phone]  [facility]    Phone: [phone]   [facility])    Phone: [facility]    Phone: [phone]  [facility]    Phone: [phone]  [place]    Phone: [phone]  [place] [facility]       Phone: [phone]   [place] [facility]  Phone: [phone]  [place]    Phone: [phone]  [facility]   Phone: [phone]  [facility]      Phone: [phone]  [facility]    Phone: [phone]

## 2018-02-05 ENCOUNTER — Ambulatory Visit: Payer: Medicaid Other | Admitting: General Practice

## 2018-02-05 DIAGNOSIS — O3680X0 Pregnancy with inconclusive fetal viability, not applicable or unspecified: Secondary | ICD-10-CM

## 2018-02-05 LAB — HCG, QUANTITATIVE, PREGNANCY: HCG, BETA CHAIN, QUANT, S: 528 m[IU]/mL — AB (ref <5)

## 2018-02-05 NOTE — Progress Notes (Signed)
Patient here for stat bhcg today. Patient reports continued mild cramping, no bleeding. Explained to patient we are monitoring her bhcg levels today and why. Discussed we will need her to wait in lobby for results/updated plan of care. Patient verbalized understanding to all & had no questions at this time.  Reviewed results with Dr Ilda Basset who finds appropriate rise in bhcg levels- patient should have follow up ultrasound in 2 weeks. Scheduled for 2/28.  Informed patient of results & ultrasound appt. Ectopic precautions given. Patient verbalized understanding & had no questions

## 2018-02-07 ENCOUNTER — Encounter (HOSPITAL_COMMUNITY): Payer: Self-pay | Admitting: Certified Registered Nurse Anesthetist

## 2018-02-07 ENCOUNTER — Encounter (HOSPITAL_COMMUNITY): Payer: Self-pay

## 2018-02-07 ENCOUNTER — Inpatient Hospital Stay (HOSPITAL_COMMUNITY): Payer: Medicaid Other

## 2018-02-07 ENCOUNTER — Inpatient Hospital Stay (HOSPITAL_COMMUNITY)
Admission: AD | Admit: 2018-02-07 | Discharge: 2018-02-07 | Disposition: A | Discharge status: Home / Self Care | Payer: Medicaid Other | Source: Ambulatory Visit | Attending: Obstetrics and Gynecology | Admitting: Obstetrics and Gynecology

## 2018-02-07 DIAGNOSIS — R109 Unspecified abdominal pain: Secondary | ICD-10-CM | POA: Diagnosis not present

## 2018-02-07 DIAGNOSIS — O99511 Diseases of the respiratory system complicating pregnancy, first trimester: Secondary | ICD-10-CM | POA: Insufficient documentation

## 2018-02-07 DIAGNOSIS — Z3A01 Less than 8 weeks gestation of pregnancy: Secondary | ICD-10-CM | POA: Insufficient documentation

## 2018-02-07 DIAGNOSIS — Z833 Family history of diabetes mellitus: Secondary | ICD-10-CM | POA: Diagnosis not present

## 2018-02-07 DIAGNOSIS — O99281 Endocrine, nutritional and metabolic diseases complicating pregnancy, first trimester: Secondary | ICD-10-CM | POA: Diagnosis not present

## 2018-02-07 DIAGNOSIS — O26891 Other specified pregnancy related conditions, first trimester: Secondary | ICD-10-CM

## 2018-02-07 DIAGNOSIS — Z8249 Family history of ischemic heart disease and other diseases of the circulatory system: Secondary | ICD-10-CM | POA: Diagnosis not present

## 2018-02-07 DIAGNOSIS — R197 Diarrhea, unspecified: Secondary | ICD-10-CM | POA: Diagnosis not present

## 2018-02-07 DIAGNOSIS — Z825 Family history of asthma and other chronic lower respiratory diseases: Secondary | ICD-10-CM | POA: Insufficient documentation

## 2018-02-07 DIAGNOSIS — O219 Vomiting of pregnancy, unspecified: Secondary | ICD-10-CM

## 2018-02-07 DIAGNOSIS — O9989 Other specified diseases and conditions complicating pregnancy, childbirth and the puerperium: Secondary | ICD-10-CM | POA: Insufficient documentation

## 2018-02-07 DIAGNOSIS — E876 Hypokalemia: Secondary | ICD-10-CM

## 2018-02-07 DIAGNOSIS — B9689 Other specified bacterial agents as the cause of diseases classified elsewhere: Secondary | ICD-10-CM

## 2018-02-07 DIAGNOSIS — Z3490 Encounter for supervision of normal pregnancy, unspecified, unspecified trimester: Secondary | ICD-10-CM

## 2018-02-07 DIAGNOSIS — Z800 Family history of malignant neoplasm of digestive organs: Secondary | ICD-10-CM | POA: Insufficient documentation

## 2018-02-07 DIAGNOSIS — O99341 Other mental disorders complicating pregnancy, first trimester: Secondary | ICD-10-CM | POA: Insufficient documentation

## 2018-02-07 DIAGNOSIS — O3680X0 Pregnancy with inconclusive fetal viability, not applicable or unspecified: Secondary | ICD-10-CM

## 2018-02-07 DIAGNOSIS — A09 Infectious gastroenteritis and colitis, unspecified: Secondary | ICD-10-CM

## 2018-02-07 DIAGNOSIS — Z9889 Other specified postprocedural states: Secondary | ICD-10-CM | POA: Diagnosis not present

## 2018-02-07 DIAGNOSIS — N760 Acute vaginitis: Secondary | ICD-10-CM

## 2018-02-07 LAB — URINALYSIS, ROUTINE W REFLEX MICROSCOPIC
BILIRUBIN URINE: NEGATIVE
Glucose, UA: NEGATIVE mg/dL
Hgb urine dipstick: NEGATIVE
Ketones, ur: NEGATIVE mg/dL
LEUKOCYTES UA: NEGATIVE
NITRITE: NEGATIVE
PH: 5 (ref 5.0–8.0)
Protein, ur: NEGATIVE mg/dL
SPECIFIC GRAVITY, URINE: 1.025 (ref 1.005–1.030)

## 2018-02-07 LAB — CBC WITH DIFFERENTIAL/PLATELET
BASOS PCT: 0 %
Basophils Absolute: 0 10*3/uL (ref 0.0–0.1)
EOS ABS: 0.2 10*3/uL (ref 0.0–0.7)
EOS PCT: 2 %
HCT: 41.5 % (ref 36.0–46.0)
HEMOGLOBIN: 14.3 g/dL (ref 12.0–15.0)
LYMPHS ABS: 2.9 10*3/uL (ref 0.7–4.0)
Lymphocytes Relative: 30 %
MCH: 30.7 pg (ref 26.0–34.0)
MCHC: 34.5 g/dL (ref 30.0–36.0)
MCV: 89.1 fL (ref 78.0–100.0)
Monocytes Absolute: 0.4 10*3/uL (ref 0.1–1.0)
Monocytes Relative: 4 %
NEUTROS PCT: 64 %
Neutro Abs: 6.1 10*3/uL (ref 1.7–7.7)
PLATELETS: 193 10*3/uL (ref 150–400)
RBC: 4.66 MIL/uL (ref 3.87–5.11)
RDW: 12.9 % (ref 11.5–15.5)
WBC: 9.7 10*3/uL (ref 4.0–10.5)

## 2018-02-07 LAB — COMPREHENSIVE METABOLIC PANEL
ALK PHOS: 35 U/L — AB (ref 38–126)
ALT: 11 U/L — AB (ref 14–54)
AST: 20 U/L (ref 15–41)
Albumin: 4.2 g/dL (ref 3.5–5.0)
Anion gap: 10 (ref 5–15)
BILIRUBIN TOTAL: 2 mg/dL — AB (ref 0.3–1.2)
BUN: 10 mg/dL (ref 6–20)
CALCIUM: 8.8 mg/dL — AB (ref 8.9–10.3)
CHLORIDE: 103 mmol/L (ref 101–111)
CO2: 22 mmol/L (ref 22–32)
CREATININE: 0.87 mg/dL (ref 0.44–1.00)
GFR calc Af Amer: >60 mL/min (ref >60)
Glucose, Bld: 137 mg/dL — ABNORMAL HIGH (ref 65–99)
Potassium: 3.1 mmol/L — ABNORMAL LOW (ref 3.5–5.1)
Sodium: 135 mmol/L (ref 135–145)
TOTAL PROTEIN: 7.2 g/dL (ref 6.5–8.1)

## 2018-02-07 LAB — RAPID URINE DRUG SCREEN, HOSP PERFORMED
AMPHETAMINES: NOT DETECTED
Barbiturates: NOT DETECTED
Benzodiazepines: POSITIVE — AB
COCAINE: NOT DETECTED
OPIATES: NOT DETECTED
Tetrahydrocannabinol: POSITIVE — AB

## 2018-02-07 LAB — HCG, QUANTITATIVE, PREGNANCY: HCG, BETA CHAIN, QUANT, S: 1896 m[IU]/mL — AB (ref <5)

## 2018-02-07 MED ORDER — ONDANSETRON 8 MG PO TBDP
8.0000 mg | ORAL_TABLET | Freq: Once | ORAL | Status: AC
  Administered 2018-02-07: 8 mg via ORAL
  Filled 2018-02-07: qty 1

## 2018-02-07 MED ORDER — PROMETHAZINE HCL 25 MG/ML IJ SOLN
25.0000 mg | Freq: Once | INTRAMUSCULAR | Status: AC
  Administered 2018-02-07: 25 mg via INTRAMUSCULAR
  Filled 2018-02-07: qty 1

## 2018-02-07 MED ORDER — LACTATED RINGERS IV BOLUS (SEPSIS)
1000.0000 mL | Freq: Once | INTRAVENOUS | Status: AC
  Administered 2018-02-07: 1000 mL via INTRAVENOUS

## 2018-02-07 MED ORDER — ONDANSETRON 4 MG PO TBDP: 4.0000 mg | ORAL_TABLET | Freq: Three times a day (TID) | ORAL | 3 refills | Status: DC | PRN

## 2018-02-07 MED ORDER — POTASSIUM CHLORIDE CRYS ER 20 MEQ PO TBCR
40.0000 meq | EXTENDED_RELEASE_TABLET | Freq: Once | ORAL | Status: AC
  Administered 2018-02-07: 40 meq via ORAL
  Filled 2018-02-07: qty 2

## 2018-02-07 NOTE — MAU Provider Note (Signed)
History   G3P1011 @ 5.3 wks in with c/o nausea, vomiting, and diarrhea since last night. Pt states thought she had food poisoning.   CSN: [id number]  Arrival date & time 02/07/18  1553   None     Chief Complaint  Patient presents with  . Nausea  . Emesis  . Diarrhea    HPI  Past Medical History:  Diagnosis Date  . Anemia   . Anxiety   . Asthma   . Thyroid disease     Past Surgical History:  Procedure Laterality Date  . TOOTH EXTRACTION      Family History  Problem Relation Age of Onset  . Asthma Father   . Diabetes Maternal Grandmother   . Hyperlipidemia Paternal Grandfather   . Heart disease Paternal Grandfather   . Cancer - Colon Maternal Grandfather   . Mental illness Neg Hx   . Birth defects Neg Hx   . Kidney disease Neg Hx   . Hypertension Neg Hx     Social History   Tobacco Use  . Smoking status: Never Smoker  . Smokeless tobacco: Never Used  Substance Use Topics  . Alcohol use: No    Alcohol/week: 0.0 oz  . Drug use: No    OB History    Gravida Para Term Preterm AB Living   '3 1 1 '$ 0 1 1   SAB TAB Ectopic Multiple Live Births   1 0 0 0 1      Review of Systems  Constitutional: Negative.   HENT: Negative.   Eyes: Negative.   Respiratory: Negative.   Cardiovascular: Negative.   Gastrointestinal: Positive for diarrhea, nausea and vomiting.  Endocrine: Negative.   Genitourinary: Negative.   Musculoskeletal: Negative.   Skin: Negative.   Allergic/Immunologic: Negative.   Neurological: Negative.   Hematological: Negative.   Psychiatric/Behavioral: Negative.     Allergies  Patient has no known allergies.  Home Medications    BP 125/73 (BP Location: Left Arm)   Pulse (!) 135   Temp 98.3 F (36.8 C) (Oral)   Resp 18   Ht $R'5\' 2"'ZM$  (1.575 m)   Wt 126 lb 12 oz (57.5 kg)   LMP 12/31/2017   BMI 23.18 kg/m   Physical Exam  Constitutional: She is oriented to person, place, and time. She appears well-developed and well-nourished.   HENT:  Head: Normocephalic.  Eyes: Pupils are equal, round, and reactive to light.  Neck: Normal range of motion.  Cardiovascular: Normal rate, regular rhythm, normal heart sounds and intact distal pulses.  Pulmonary/Chest: Effort normal and breath sounds normal.  Abdominal: Soft. Bowel sounds are normal.  Musculoskeletal: Normal range of motion.  Neurological: She is alert and oriented to person, place, and time. She has normal reflexes.  Skin: Skin is warm and dry.  Psychiatric: She has a normal mood and affect. Her behavior is normal. Judgment and thought content normal.    MAU Course  Procedures (including critical care time)  Labs Reviewed  URINALYSIS, ROUTINE W REFLEX MICROSCOPIC  HCG, QUANTITATIVE, PREGNANCY  COMPREHENSIVE METABOLIC PANEL   No results found.   1. Nausea and vomiting during pregnancy   2. Diarrhea of infectious origin       MDM  Tachycardia noted, EKG done and read by Dr. Oval Linsey as sinus tachycardia. k 3.1 will give 75meq of potassium. US shows IU gestational sac. No vag bleeding.Keeping clear fluids down now. D/c home to follow up Monday in clinic for repeat quant.

## 2018-02-07 NOTE — Discharge Instructions (Signed)

## 2018-02-07 NOTE — MAU Note (Signed)
Throwing up and diarrhea since last night- thought it was food poisoning.  Has not attempted to eat today.

## 2018-02-09 ENCOUNTER — Ambulatory Visit: Payer: Medicaid Other | Admitting: General Practice

## 2018-02-09 DIAGNOSIS — Z3490 Encounter for supervision of normal pregnancy, unspecified, unspecified trimester: Secondary | ICD-10-CM

## 2018-02-09 NOTE — Progress Notes (Signed)
Patient came into office today for stat bhcg. Per chart review, patient's bhcg have been followed previously in the office and were rising appropriately. Patient has follow up scan scheduled 2/28. Patient went to MAU over the weekend for food poisoning and another bhcg level was done, also rising appropriately. Discussed with Dr Roselie Awkward who states bhcg not indicated today. Will plan to keep previously scheduled ultrasound on 2/28. Patient verbalized understanding to all & had no questions

## 2018-02-19 ENCOUNTER — Other Ambulatory Visit: Payer: Self-pay | Admitting: Nurse Practitioner

## 2018-02-19 ENCOUNTER — Other Ambulatory Visit: Payer: Medicaid Other

## 2018-02-19 ENCOUNTER — Ambulatory Visit (HOSPITAL_COMMUNITY)
Admission: RE | Admit: 2018-02-19 | Discharge: 2018-02-19 | Disposition: A | Discharge status: Home / Self Care | Payer: Medicaid Other | Source: Ambulatory Visit | Attending: Obstetrics and Gynecology | Admitting: Obstetrics and Gynecology

## 2018-02-19 ENCOUNTER — Encounter: Payer: Self-pay | Admitting: Family Medicine

## 2018-02-19 ENCOUNTER — Encounter: Payer: Self-pay | Admitting: Nurse Practitioner

## 2018-02-19 DIAGNOSIS — Z3A01 Less than 8 weeks gestation of pregnancy: Secondary | ICD-10-CM | POA: Diagnosis not present

## 2018-02-19 DIAGNOSIS — Z3491 Encounter for supervision of normal pregnancy, unspecified, first trimester: Secondary | ICD-10-CM | POA: Insufficient documentation

## 2018-02-19 DIAGNOSIS — O3680X0 Pregnancy with inconclusive fetal viability, not applicable or unspecified: Secondary | ICD-10-CM

## 2018-02-19 NOTE — Progress Notes (Signed)
Client had ultrasound today confirming IUP at [redacted]w[redacted]d.  Client given pregnancy verification.  She plans to call the clinic at [facility] for an appointment  - phone number given.  Has Medicaid.  No further questions for today.    Earlie Server, RN, MSN, NP-BC Nurse Practitioner, [facility] for [employer], [facility] 02/19/2018 2:07 PM

## 2018-02-22 ENCOUNTER — Encounter (HOSPITAL_COMMUNITY): Payer: Self-pay

## 2018-02-22 ENCOUNTER — Inpatient Hospital Stay (HOSPITAL_COMMUNITY)
Admission: AD | Admit: 2018-02-22 | Discharge: 2018-02-22 | Disposition: A | Discharge status: Home / Self Care | Payer: Medicaid Other | Source: Ambulatory Visit | Attending: Obstetrics & Gynecology | Admitting: Obstetrics & Gynecology

## 2018-02-22 DIAGNOSIS — Z3A01 Less than 8 weeks gestation of pregnancy: Secondary | ICD-10-CM | POA: Insufficient documentation

## 2018-02-22 DIAGNOSIS — R197 Diarrhea, unspecified: Secondary | ICD-10-CM | POA: Diagnosis not present

## 2018-02-22 DIAGNOSIS — R509 Fever, unspecified: Secondary | ICD-10-CM | POA: Diagnosis not present

## 2018-02-22 DIAGNOSIS — A084 Viral intestinal infection, unspecified: Secondary | ICD-10-CM | POA: Diagnosis not present

## 2018-02-22 DIAGNOSIS — O210 Mild hyperemesis gravidarum: Secondary | ICD-10-CM | POA: Insufficient documentation

## 2018-02-22 LAB — URINALYSIS, ROUTINE W REFLEX MICROSCOPIC
Bilirubin Urine: NEGATIVE
GLUCOSE, UA: NEGATIVE mg/dL
HGB URINE DIPSTICK: NEGATIVE
KETONES UR: 80 mg/dL — AB
Nitrite: NEGATIVE
PH: 6 (ref 5.0–8.0)
Protein, ur: 30 mg/dL — AB
SPECIFIC GRAVITY, URINE: 1.032 — AB (ref 1.005–1.030)

## 2018-02-22 MED ORDER — LACTATED RINGERS IV BOLUS (SEPSIS)
1000.0000 mL | Freq: Once | INTRAVENOUS | Status: AC
  Administered 2018-02-22: 1000 mL via INTRAVENOUS

## 2018-02-22 MED ORDER — METRONIDAZOLE 0.75 % VA GEL: 1.0000 | Freq: Two times a day (BID) | VAGINAL | 0 refills | Status: DC

## 2018-02-22 MED ORDER — ONDANSETRON 8 MG PO TBDP
8.0000 mg | ORAL_TABLET | Freq: Once | ORAL | Status: AC
  Administered 2018-02-22: 8 mg via ORAL
  Filled 2018-02-22: qty 1

## 2018-02-22 NOTE — MAU Provider Note (Signed)
History   G3P1011 @ 7.4 wks has been sick for 10 days. Has had nausea, vomiting diarrhea and fever. Denies Vag bleeding  CSN: [id number]  Arrival date & time 02/22/18  1401   None     Chief Complaint  Patient presents with  . Emesis  . Nausea  . Diarrhea    HPI  Past Medical History:  Diagnosis Date  . Anemia   . Anxiety   . Asthma   . Thyroid disease     Past Surgical History:  Procedure Laterality Date  . TOOTH EXTRACTION      Family History  Problem Relation Age of Onset  . Asthma Father   . Diabetes Maternal Grandmother   . Hyperlipidemia Paternal Grandfather   . Heart disease Paternal Grandfather   . Cancer - Colon Maternal Grandfather   . Mental illness Neg Hx   . Birth defects Neg Hx   . Kidney disease Neg Hx   . Hypertension Neg Hx     Social History   Tobacco Use  . Smoking status: Never Smoker  . Smokeless tobacco: Never Used  Substance Use Topics  . Alcohol use: No    Alcohol/week: 0.0 oz  . Drug use: No    OB History    Gravida Para Term Preterm AB Living   '3 1 1 '$ 0 1 1   SAB TAB Ectopic Multiple Live Births   1 0 0 0 1      Review of Systems  Constitutional: Positive for fever.  HENT: Positive for congestion.   Eyes: Negative.   Respiratory: Positive for cough.   Cardiovascular: Negative.   Gastrointestinal: Positive for diarrhea and nausea.  Endocrine: Negative.   Genitourinary: Negative.   Musculoskeletal: Negative.   Skin: Negative.   Allergic/Immunologic: Negative.   Neurological: Negative.   Hematological: Negative.   Psychiatric/Behavioral: Negative.     Allergies  Patient has no known allergies.  Home Medications    BP (!) 106/58 (BP Location: Left Arm)   Pulse 80   Temp 98 F (36.7 C) (Oral)   Resp 18   Ht $R'5\' 2"'Ec$  (1.575 m)   Wt 116 lb 9.6 oz (52.9 kg)   LMP 12/31/2017   BMI 21.33 kg/m   Physical Exam  Constitutional: She is oriented to person, place, and time. She appears well-developed and  well-nourished.  HENT:  Head: Normocephalic.  Eyes: Pupils are equal, round, and reactive to light.  Neck: Normal range of motion.  Cardiovascular: Normal rate, regular rhythm, normal heart sounds and intact distal pulses.  Pulmonary/Chest: Effort normal and breath sounds normal.  Abdominal: Soft. Bowel sounds are normal.  Genitourinary: Vagina normal and uterus normal.  Neurological: She is alert and oriented to person, place, and time. She has normal reflexes.  Skin: Skin is warm and dry.  Psychiatric: She has a normal mood and affect. Her behavior is normal. Judgment and thought content normal.    MAU Course  Procedures (including critical care time)  Labs Reviewed  URINALYSIS, ROUTINE W REFLEX MICROSCOPIC - Abnormal; Notable for the following components:      Result Value   Color, Urine AMBER (*)    APPearance HAZY (*)    Specific Gravity, Urine 1.032 (*)    Ketones, ur 80 (*)    Protein, ur 30 (*)    Leukocytes, UA TRACE (*)    Bacteria, UA RARE (*)    Squamous Epithelial / LPF 6-30 (*)    All other components within normal  limits   No results found.   No diagnosis found.    MDM  VSS, Heart RRR, LCTAB. U/a 80 ketones. Will iv hydrate. zofran for nausea. Feeling better desires d/c home. Able to retain cl fluids.

## 2018-02-22 NOTE — Discharge Instructions (Signed)
Viral Gastroenteritis, Adult  Viral gastroenteritis is also known as the stomach flu. This condition is caused by various viruses. These viruses can be passed from person to person very easily (are very contagious). This condition may affect your stomach, small intestine, and large intestine. It can cause sudden watery diarrhea, fever, and vomiting.  Diarrhea and vomiting can make you feel weak and cause you to become dehydrated. You may not be able to keep fluids down. Dehydration can make you tired and thirsty, cause you to have a dry mouth, and decrease how often you urinate. Older adults and people with other diseases or a weak immune system are at higher risk for dehydration.  It is important to replace the fluids that you lose from diarrhea and vomiting. If you become severely dehydrated, you may need to get fluids through an IV tube.  What are the causes?  Gastroenteritis is caused by various viruses, including rotavirus and norovirus. Norovirus is the most common cause in adults.  You can get sick by eating food, drinking water, or touching a surface contaminated with one of these viruses. You can also get sick from sharing utensils or other personal items with an infected person.  What increases the risk?  This condition is more likely to develop in people:  · Who have a weak defense system (immune system).  · Who live with one or more children who are younger than 2 years old.  · Who live in a nursing home.  · Who go on cruise ships.    What are the signs or symptoms?  Symptoms of this condition start suddenly 1-2 days after exposure to a virus. Symptoms may last a few days or as long as a week. The most common symptoms are watery diarrhea and vomiting. Other symptoms include:  · Fever.  · Headache.  · Fatigue.  · Pain in the abdomen.  · Chills.  · Weakness.  · Nausea.  · Muscle aches.  · Loss of appetite.    How is this diagnosed?  This condition is diagnosed with a medical history and physical exam. You  may also have a stool test to check for viruses or other infections.  How is this treated?  This condition typically goes away on its own. The focus of treatment is to restore lost fluids (rehydration). Your health care provider may recommend that you take an oral rehydration solution (ORS) to replace important salts and minerals (electrolytes) in your body. Severe cases of this condition may require giving fluids through an IV tube.  Treatment may also include medicine to help with your symptoms.  Follow these instructions at home:  Follow instructions from your health care provider about how to care for yourself at home.  Eating and drinking  Follow these recommendations as told by your health care provider:  · Take an ORS. This is a drink that is sold at pharmacies and retail stores.  · Drink clear fluids in small amounts as you are able. Clear fluids include water, ice chips, diluted fruit juice, and low-calorie sports drinks.  · Eat bland, easy-to-digest foods in small amounts as you are able. These foods include bananas, applesauce, rice, lean meats, toast, and crackers.  · Avoid fluids that contain a lot of sugar or caffeine, such as energy drinks, sports drinks, and soda.  · Avoid alcohol.  · Avoid spicy or fatty foods.    General instructions    · Drink enough fluid to keep your urine clear or   pale yellow.  · Wash your hands often. If soap and water are not available, use hand sanitizer.  · Make sure that all people in your household wash their hands well and often.  · Take over-the-counter and prescription medicines only as told by your health care provider.  · Rest at home while you recover.  · Watch your condition for any changes.  · Take a warm bath to relieve any burning or pain from frequent diarrhea episodes.  · Keep all follow-up visits as told by your health care provider. This is important.  Contact a health care provider if:  · You cannot keep fluids down.  · Your symptoms get worse.  · You have  new symptoms.  · You feel light-headed or dizzy.  · You have muscle cramps.  Get help right away if:  · You have chest pain.  · You feel extremely weak or you faint.  · You see blood in your vomit.  · Your vomit looks like coffee grounds.  · You have bloody or black stools or stools that look like tar.  · You have a severe headache, a stiff neck, or both.  · You have a rash.  · You have severe pain, cramping, or bloating in your abdomen.  · You have trouble breathing or you are breathing very quickly.  · Your heart is beating very quickly.  · Your skin feels cold and clammy.  · You feel confused.  · You have pain when you urinate.  · You have signs of dehydration, such as:  ? Dark urine, very little urine, or no urine.  ? Cracked lips.  ? Dry mouth.  ? Sunken eyes.  ? Sleepiness.  ? Weakness.  This information is not intended to replace advice given to you by your health care provider. Make sure you discuss any questions you have with your health care provider.  Document Released: 12/09/2005 Document Revised: 05/22/2016 Document Reviewed: 08/15/2015  Elsevier Interactive Patient Education © 2018 Elsevier Inc.

## 2018-02-22 NOTE — MAU Note (Signed)
Nausea, vomitting, and diarrhea for about 2 weeks. Was unable to pick up Rx zofran. Was Rx flagyl for BV but has been unable to take it due to vomitting.

## 2018-03-12 ENCOUNTER — Ambulatory Visit (INDEPENDENT_AMBULATORY_CARE_PROVIDER_SITE_OTHER): Payer: Medicaid Other | Admitting: Clinical

## 2018-03-12 ENCOUNTER — Ambulatory Visit (INDEPENDENT_AMBULATORY_CARE_PROVIDER_SITE_OTHER): Payer: Medicaid Other | Admitting: Student

## 2018-03-12 ENCOUNTER — Encounter: Payer: Self-pay | Admitting: Student

## 2018-03-12 ENCOUNTER — Other Ambulatory Visit (HOSPITAL_COMMUNITY)
Admission: RE | Admit: 2018-03-12 | Discharge: 2018-03-12 | Disposition: A | Discharge status: Home / Self Care | Payer: Medicaid Other | Source: Ambulatory Visit | Attending: Student | Admitting: Student

## 2018-03-12 VITALS — BP 116/67 | HR 77 | Wt 122.1 lb

## 2018-03-12 DIAGNOSIS — Z3481 Encounter for supervision of other normal pregnancy, first trimester: Secondary | ICD-10-CM

## 2018-03-12 DIAGNOSIS — N898 Other specified noninflammatory disorders of vagina: Secondary | ICD-10-CM | POA: Insufficient documentation

## 2018-03-12 DIAGNOSIS — Z3A00 Weeks of gestation of pregnancy not specified: Secondary | ICD-10-CM | POA: Diagnosis not present

## 2018-03-12 DIAGNOSIS — Z3480 Encounter for supervision of other normal pregnancy, unspecified trimester: Secondary | ICD-10-CM | POA: Diagnosis not present

## 2018-03-12 DIAGNOSIS — F39 Unspecified mood [affective] disorder: Secondary | ICD-10-CM

## 2018-03-12 DIAGNOSIS — E0590 Thyrotoxicosis, unspecified without thyrotoxic crisis or storm: Secondary | ICD-10-CM | POA: Insufficient documentation

## 2018-03-12 DIAGNOSIS — E079 Disorder of thyroid, unspecified: Secondary | ICD-10-CM | POA: Insufficient documentation

## 2018-03-12 LAB — POCT URINALYSIS DIP (DEVICE)
BILIRUBIN URINE: NEGATIVE
Glucose, UA: NEGATIVE mg/dL
Hgb urine dipstick: NEGATIVE
KETONES UR: NEGATIVE mg/dL
NITRITE: NEGATIVE
Protein, ur: 30 mg/dL — AB
Specific Gravity, Urine: 1.02 (ref 1.005–1.030)
Urobilinogen, UA: 1 mg/dL (ref 0.0–1.0)
pH: 7.5 (ref 5.0–8.0)

## 2018-03-12 MED ORDER — RANITIDINE HCL 150 MG PO TABS: 150.0000 mg | ORAL_TABLET | Freq: Two times a day (BID) | ORAL | 1 refills | Status: DC

## 2018-03-12 MED ORDER — PROMETHAZINE HCL 25 MG RE SUPP: 25.0000 mg | Freq: Four times a day (QID) | RECTAL | 0 refills | Status: DC | PRN

## 2018-03-12 MED ORDER — VALACYCLOVIR HCL 1 G PO TABS: 1000.0000 mg | ORAL_TABLET | Freq: Two times a day (BID) | ORAL | 5 refills | Status: DC

## 2018-03-12 NOTE — Patient Instructions (Signed)
My Safety Plan:   Step 1: Warning signs (thoughts, images, mood, situation, behavior) that a crisis may be developing: If I start to feel overwhelmed  Step 2: Internal coping strategies: Things I can do to take my mind off my problems without contacting another person (music, relaxation technique, physical activity): My own self-coping strategy   Step 3: Agencies I can contact during a crisis:      1. 9-1-1     2. [employer] (24/7 walk-in) [address] [place], [place]     3. [place]: Intake- [id number] [phone]     4. Closest Emergency Room Address: [place] [address])  Suicide Prevention Lifeline Phone: [phone]  Step 6: Making the environment safe- Have friend or family remove from home: My cousin will do this for me * Weapons in the home * Medication in the home (including Tylenol)  Step 7: The one thing that is most important to me and worth living for is: My child  Signature of Patient: _________________________________________________  Signature of Provider: ________________________________________________  [facility] [place], [place], [place]. [facility] [address] [place], [place]  [facility] [address] [place], [place]  [facility] [address] [phone]  [facility] [address], [place], [place]

## 2018-03-12 NOTE — Progress Notes (Signed)
  Subjective:    Angela Branch is being seen today for her first obstetrical visit.  This is not a planned pregnancy. She is at [redacted]w[redacted]d gestation. Her obstetrical history is significant for nothing. . Relationship with FOB: not in contact. . Patient does intend to breast feed. Pregnancy history fully reviewed.  Patient reports  a sore on her periurethral area that started about 2-3 days ago. IT was itching and started to blister; it scabbed over after she put some tea tree oil on it. . She also feels sad and depressed about being pregnant again and the fact that she does not have the FOBs support.   Also says that she was told once that she had thyroid disease but she can't remember what kind. Never tookl medication for it. Was told this once, and then says that [facility] told her it was fine.  Review of Systems:   Review of Systems  Constitutional: Negative.   HENT: Negative.   Respiratory: Negative.   Cardiovascular: Negative.   Gastrointestinal: Negative.   Genitourinary: Negative.   Musculoskeletal: Negative.   Neurological: Negative.     Objective:     BP 116/67   Pulse 77   Wt 122 lb 1.6 oz (55.4 kg)   LMP 12/31/2017   BMI 22.33 kg/m  Physical Exam  Constitutional: She appears well-developed.  HENT:  Head: Normocephalic.  Neck: Normal range of motion.  Respiratory: Effort normal.  GI: Soft.  Genitourinary:  Genitourinary Comments: External genitalia was normal except for small open sore on clitoral hood; no signs of blistering but skin is open and slightly bloody. No pus. No inflammation, heat or redness. No other lesions.   Neurological: She is alert.  Skin: Skin is warm and dry.    Exam    Assessment:    Pregnancy: G3P1011 Patient Active Problem List   Diagnosis Date Noted  . Supervision of other normal pregnancy, antepartum 03/12/2018  . Bacterial vaginitis 02/03/2018  . Pregnancy of unknown anatomic location 02/03/2018  . Adjustment disorder with  mixed anxiety and depressed mood 04/29/2016  . GERD (gastroesophageal reflux disease) 09/21/2015  . Eczema 10/04/2014  . Asthma 06/12/2012       Plan:     Initial labs drawn. Prenatal vitamins. Problem list reviewed and updated. AFP3 discussed: NIPS ordered. . Role of ultrasound in pregnancy discussed; fetal survey: order at next visit. . Amniocentesis discussed: not indicated. Follow up in 4 weeks. 75 % of 30 min visit spent on counseling and coordination of care.  Elevated PHQ-9 and GAD-7; patient to see Roselyn Reef today.  -Hgb A1c -TSH levels today  -Zantac and Phenergan suppositories ordered -herpes culture today  Mervyn Skeeters Koo[place]ra CNM 03/12/2018

## 2018-03-12 NOTE — Patient Instructions (Signed)

## 2018-03-12 NOTE — BH Specialist Note (Signed)
Integrated Behavioral Health Initial Visit  MRN: [id number] Name: Angela Branch  Number of [facility] Clinician visits:: 1/6 Session Start time: 10:00 Session End time: 10:30 Total time: 30 minutes  Type of Service: [place] Interpretor:No. Interpretor Name and Language: n/a   Warm Hand Off Completed.       SUBJECTIVE: Angela Branch is a 22 y.o. female accompanied by n/a Patient was referred by Maye Hides, CNM for depression and anxiety. Patient reports the following symptoms/concerns: Pt states she is not concerned about her feelings of anxiety and depression, as this has been her "normal" for at least 5 years. Pt is sleeping only 1-3 hours/night, and just "deals with it". Pt plans to apply for disability for her depression/anxiety, wants to get into more stable housing, and is thinking about starting some meditation and/or yoga to cope.  Duration of problem: Increase in current pregnancy, ongoing for at least 5 years; Severity of problem: severe  OBJECTIVE: Mood: Normal and Affect: Appropriate Risk of harm to self or others: Suicidal ideation No plan to harm self or others  LIFE CONTEXT: Family and Social: Lives with one adult and her 2yo son School/Work: Not working; applying for disability Self-Care: Used to smoke marijuana to cope; sleeps 1-3 hours/night Life Changes: Current pregnancy   GOALS ADDRESSED: Patient will: 1. Reduce symptoms of: anxiety, depression, insomnia, mood instability and stress 2. Increase knowledge and/or ability of: coping skills and stress reduction  3. Demonstrate ability to: Increase healthy adjustment to current life circumstances and Increase adequate support systems for patient/family  INTERVENTIONS: Interventions utilized: Solution-Focused Strategies, Sleep Hygiene, Psychoeducation and/or Health Education and Link to [employer]  Standardized Assessments completed:  GAD-7 and PHQ 2&9 with C-SSRS  ASSESSMENT: Patient currently experiencing Mood disorder .   Patient may benefit from psychoeducation and brief therapeutic interventions regarding coping with symptoms of anxiety and depression related to mood disorder. Marland Kitchen  PLAN: 1. Follow up with behavioral health clinician on : One month 2. Behavioral recommendations:  -Follow [facility] [place] prior to the next medical visit in one month -Consider apps(sleep and meditation) as additional self-coping strategies -Read educational material regarding coping with symptoms of anxiety and depression 3. Referral(s): [place] (In Clinic) and [place] (LME/Outside Clinic) 4. "From scale of 1-10, how likely are you to follow plan?": 8  Garlan Fair, LCSW  Depression screen [facility] 2/9 03/12/2018 09/28/2013  Decreased Interest 3 0  Down, Depressed, Hopeless 3 0  PHQ - 2 Score 6 0  Altered sleeping 3 0  Tired, decreased energy 3 1  Change in appetite 3 0  Feeling bad or failure about yourself  3 0  Trouble concentrating 2 0  Moving slowly or fidgety/restless 2 0  Suicidal thoughts 2 0  PHQ-9 Score 24 1   GAD 7 : Generalized Anxiety Score 03/12/2018  Nervous, Anxious, on Edge 3  Control/stop worrying 3  Worry too much - different things 3  Trouble relaxing 3  Restless 0  Easily annoyed or irritable 3  Afraid - awful might happen 3  Total GAD 7 Score 18

## 2018-03-13 LAB — TSH: TSH: 0.454 u[IU]/mL (ref 0.450–4.500)

## 2018-03-13 LAB — GC/CHLAMYDIA PROBE AMP (~~LOC~~) NOT AT ARMC
Chlamydia: NEGATIVE
NEISSERIA GONORRHEA: NEGATIVE

## 2018-03-13 LAB — HEMOGLOBIN A1C
Est. average glucose Bld gHb Est-mCnc: 105 mg/dL
HEMOGLOBIN A1C: 5.3 % (ref 4.8–5.6)

## 2018-03-13 LAB — T3, FREE: T3, Free: 3.8 pg/mL (ref 2.0–4.4)

## 2018-03-13 LAB — T4, FREE: FREE T4: 1.35 ng/dL (ref 0.82–1.77)

## 2018-03-14 LAB — CULTURE, OB URINE

## 2018-03-14 LAB — URINE CULTURE, OB REFLEX

## 2018-03-15 LAB — HERPES SIMPLEX VIRUS CULTURE

## 2018-03-16 ENCOUNTER — Encounter: Payer: Self-pay | Admitting: Student

## 2018-03-16 DIAGNOSIS — B009 Herpesviral infection, unspecified: Secondary | ICD-10-CM

## 2018-03-16 HISTORY — DX: Herpesviral infection, unspecified: B00.9

## 2018-03-18 ENCOUNTER — Encounter: Payer: Self-pay | Admitting: Student

## 2018-03-20 LAB — OBSTETRIC PANEL, INCLUDING HIV
Antibody Screen: NEGATIVE
BASOS ABS: 0 10*3/uL (ref 0.0–0.2)
Basos: 0 %
EOS (ABSOLUTE): 0.2 10*3/uL (ref 0.0–0.4)
Eos: 2 %
HEP B S AG: NEGATIVE
HIV Screen 4th Generation wRfx: NONREACTIVE
Hematocrit: 38.3 % (ref 34.0–46.6)
Hemoglobin: 12.2 g/dL (ref 11.1–15.9)
Immature Grans (Abs): 0 10*3/uL (ref 0.0–0.1)
Immature Granulocytes: 0 %
LYMPHS ABS: 2.4 10*3/uL (ref 0.7–3.1)
Lymphs: 29 %
MCH: 29.4 pg (ref 26.6–33.0)
MCHC: 31.9 g/dL (ref 31.5–35.7)
MCV: 92 fL (ref 79–97)
MONOS ABS: 0.7 10*3/uL (ref 0.1–0.9)
Monocytes: 8 %
NEUTROS PCT: 61 %
Neutrophils Absolute: 5.1 10*3/uL (ref 1.4–7.0)
PLATELETS: 213 10*3/uL (ref 150–379)
RBC: 4.15 x10E6/uL (ref 3.77–5.28)
RDW: 14.1 % (ref 12.3–15.4)
RPR: NONREACTIVE
Rh Factor: POSITIVE
Rubella Antibodies, IGG: 1.5 index (ref >0.99)
WBC: 8.3 10*3/uL (ref 3.4–10.8)

## 2018-03-20 LAB — CYSTIC FIBROSIS GENE TEST

## 2018-03-20 LAB — SMN1 COPY NUMBER ANALYSIS (SMA CARRIER SCREENING)

## 2018-03-25 ENCOUNTER — Encounter: Payer: Self-pay | Admitting: *Deleted

## 2018-03-26 ENCOUNTER — Other Ambulatory Visit: Payer: Self-pay | Admitting: Advanced Practice Midwife

## 2018-03-26 DIAGNOSIS — Z3480 Encounter for supervision of other normal pregnancy, unspecified trimester: Secondary | ICD-10-CM

## 2018-04-02 ENCOUNTER — Encounter: Payer: Self-pay | Admitting: Student

## 2018-04-03 ENCOUNTER — Encounter: Payer: Self-pay | Admitting: *Deleted

## 2018-04-06 DIAGNOSIS — H5202 Hypermetropia, left eye: Secondary | ICD-10-CM | POA: Diagnosis not present

## 2018-04-09 ENCOUNTER — Ambulatory Visit (INDEPENDENT_AMBULATORY_CARE_PROVIDER_SITE_OTHER): Payer: Medicaid Other | Admitting: Obstetrics & Gynecology

## 2018-04-09 VITALS — BP 111/57 | HR 81 | Wt 126.2 lb

## 2018-04-09 DIAGNOSIS — E079 Disorder of thyroid, unspecified: Secondary | ICD-10-CM

## 2018-04-09 DIAGNOSIS — Z3482 Encounter for supervision of other normal pregnancy, second trimester: Secondary | ICD-10-CM

## 2018-04-09 DIAGNOSIS — B009 Herpesviral infection, unspecified: Secondary | ICD-10-CM

## 2018-04-09 DIAGNOSIS — Z3480 Encounter for supervision of other normal pregnancy, unspecified trimester: Secondary | ICD-10-CM

## 2018-04-09 NOTE — Progress Notes (Signed)
   PRENATAL VISIT NOTE  Subjective:  Angela Branch is a 22 y.o. G3P1011 at [redacted]w[redacted]d being seen today for ongoing prenatal care.  She is currently monitored for the following issues for this low-risk pregnancy and has Asthma; Eczema; GERD (gastroesophageal reflux disease); Adjustment disorder with mixed anxiety and depressed mood; Bacterial vaginitis; Supervision of other normal pregnancy, antepartum; Thyroid disease; and Herpes simplex type 2 infection on their problem list.  Patient reports nausea.   .  .   . Denies leaking of fluid.   The following portions of the patient's history were reviewed and updated as appropriate: allergies, current medications, past family history, past medical history, past social history, past surgical history and problem list. Problem list updated.  Objective:  There were no vitals filed for this visit.  Fetal Status:           General:  Alert, oriented and cooperative. Patient is in no acute distress.  Skin: Skin is warm and dry. No rash noted.   Cardiovascular: Normal heart rate noted  Respiratory: Normal respiratory effort, no problems with respiration noted  Abdomen: Soft, gravid, appropriate for gestational age.        Pelvic: Cervical exam deferred        Extremities: Normal range of motion.     Mental Status: Normal mood and affect. Normal behavior. Normal judgment and thought content.   Assessment and Plan:  Pregnancy: G3P1011 at [redacted]w[redacted]d  1. Supervision of other normal pregnancy, antepartum Pt reports continued nausea. Without emesis.  Pt taking Zofran with relief. She's still taking it ~2x/day  2. Herpes simplex type 2 infection No meds currently.   3. Thyroid disease Results WNL. rec repeat at 28 weeks  Preterm labor symptoms and general obstetric precautions including but not limited to vaginal bleeding, contractions, leaking of fluid and fetal movement were reviewed in detail with the patient. Please refer to After Visit Summary for  other counseling recommendations.  Return in about 1 month (around 05/07/2018).  No future appointments.  Lavonia Drafts, MD

## 2018-04-24 ENCOUNTER — Inpatient Hospital Stay (HOSPITAL_COMMUNITY)
Admission: AD | Admit: 2018-04-24 | Discharge: 2018-04-24 | Disposition: A | Discharge status: Home / Self Care | Payer: Medicaid Other | Source: Ambulatory Visit | Attending: Obstetrics and Gynecology | Admitting: Obstetrics and Gynecology

## 2018-04-24 DIAGNOSIS — R1011 Right upper quadrant pain: Secondary | ICD-10-CM | POA: Diagnosis not present

## 2018-04-24 DIAGNOSIS — K5903 Drug induced constipation: Secondary | ICD-10-CM

## 2018-04-24 DIAGNOSIS — G43809 Other migraine, not intractable, without status migrainosus: Secondary | ICD-10-CM

## 2018-04-24 DIAGNOSIS — R202 Paresthesia of skin: Secondary | ICD-10-CM

## 2018-04-24 DIAGNOSIS — O210 Mild hyperemesis gravidarum: Secondary | ICD-10-CM | POA: Insufficient documentation

## 2018-04-24 DIAGNOSIS — K5900 Constipation, unspecified: Secondary | ICD-10-CM | POA: Insufficient documentation

## 2018-04-24 DIAGNOSIS — O26892 Other specified pregnancy related conditions, second trimester: Secondary | ICD-10-CM | POA: Diagnosis not present

## 2018-04-24 DIAGNOSIS — Z3A16 16 weeks gestation of pregnancy: Secondary | ICD-10-CM | POA: Diagnosis not present

## 2018-04-24 DIAGNOSIS — G43909 Migraine, unspecified, not intractable, without status migrainosus: Secondary | ICD-10-CM | POA: Insufficient documentation

## 2018-04-24 DIAGNOSIS — R112 Nausea with vomiting, unspecified: Secondary | ICD-10-CM

## 2018-04-24 LAB — COMPREHENSIVE METABOLIC PANEL
ALK PHOS: 31 U/L — AB (ref 38–126)
ALT: 13 U/L — AB (ref 14–54)
AST: 24 U/L (ref 15–41)
Albumin: 3.5 g/dL (ref 3.5–5.0)
Anion gap: 12 (ref 5–15)
BUN: 12 mg/dL (ref 6–20)
CALCIUM: 8.6 mg/dL — AB (ref 8.9–10.3)
CO2: 20 mmol/L — ABNORMAL LOW (ref 22–32)
CREATININE: 0.57 mg/dL (ref 0.44–1.00)
Chloride: 103 mmol/L (ref 101–111)
GFR calc non Af Amer: >60 mL/min (ref >60)
Glucose, Bld: 75 mg/dL (ref 65–99)
Potassium: 3.6 mmol/L (ref 3.5–5.1)
Sodium: 135 mmol/L (ref 135–145)
Total Bilirubin: 0.8 mg/dL (ref 0.3–1.2)
Total Protein: 6.4 g/dL — ABNORMAL LOW (ref 6.5–8.1)

## 2018-04-24 LAB — CBC
HCT: 33.3 % — ABNORMAL LOW (ref 36.0–46.0)
Hemoglobin: 11.5 g/dL — ABNORMAL LOW (ref 12.0–15.0)
MCH: 30.5 pg (ref 26.0–34.0)
MCHC: 34.5 g/dL (ref 30.0–36.0)
MCV: 88.3 fL (ref 78.0–100.0)
PLATELETS: 186 10*3/uL (ref 150–400)
RBC: 3.77 MIL/uL — AB (ref 3.87–5.11)
RDW: 14 % (ref 11.5–15.5)
WBC: 11.3 10*3/uL — ABNORMAL HIGH (ref 4.0–10.5)

## 2018-04-24 LAB — URINALYSIS, ROUTINE W REFLEX MICROSCOPIC
BILIRUBIN URINE: NEGATIVE
Glucose, UA: NEGATIVE mg/dL
Hgb urine dipstick: NEGATIVE
Ketones, ur: 5 mg/dL — AB
Leukocytes, UA: NEGATIVE
NITRITE: NEGATIVE
PH: 5 (ref 5.0–8.0)
Protein, ur: NEGATIVE mg/dL
SPECIFIC GRAVITY, URINE: 1.026 (ref 1.005–1.030)

## 2018-04-24 LAB — LIPASE, BLOOD: Lipase: 29 U/L (ref 11–51)

## 2018-04-24 MED ORDER — DOXYLAMINE-PYRIDOXINE ER 20-20 MG PO TBCR: 1.0000 | EXTENDED_RELEASE_TABLET | Freq: Every evening | ORAL | 3 refills | Status: DC | PRN

## 2018-04-24 MED ORDER — BUTALBITAL-APAP-CAFFEINE 50-325-40 MG PO TABS: 1.0000 | ORAL_TABLET | Freq: Four times a day (QID) | ORAL | 0 refills | Status: DC | PRN

## 2018-04-24 MED ORDER — BUTALBITAL-APAP-CAFFEINE 50-325-40 MG PO TABS
1.0000 | ORAL_TABLET | Freq: Once | ORAL | Status: DC
  Filled 2018-04-24: qty 1

## 2018-04-24 MED ORDER — POLYETHYLENE GLYCOL 3350 17 G PO PACK: 17.0000 g | PACK | Freq: Every day | ORAL | 0 refills | Status: DC | PRN

## 2018-04-24 NOTE — MAU Note (Signed)
PT SAY   HAS H/A-  AND MID ABD PAIN STARTED  3 WEEKS  AGO-   HAS HX OF  H/A -  BUT NO MEDS  FOR IT . FEELS NUMBNESS IN HER FEET -   3 WEEKS AGO- AFTER SHE WOKE UP.        PT SAYS SHE TAKES ZOFRAN  TWICE / DAY  AND  IS VERY CONSTIPATED- LAST BM WAS YESTERDAY -  ROUND BALLS.

## 2018-04-24 NOTE — MAU Provider Note (Signed)
Chief Complaint:  Abdominal Pain and Headache   First Provider Initiated Contact with Patient 04/24/18 0418     HPI: Angela Branch is a 22 y.o. G3P1011 at 50w2dwho presents to maternity admissions reporting numbness and tingling "pain" in both feet and calves, migraine headache and right upper quadrant pain.  States they all started 2 weeks ago but got worse tonight. The RUQ pain is in one small particular place, just below bottom rib.Marland KitchenAlso has persistent intermittent nausea and vomitng (using Zofran) with constipation.  Did not know Zofran causes constipation    She denies LOF, vaginal bleeding, vaginal itching/burning, urinary symptoms, dizziness, diarrhea, or fever/chills.    States has a long history of migraines but doesn't take anything for them.  Does not like medications. States only eats twice a day.  States has always been like that.  Is most concerned about tingling pain in both feet.  States it is excruciating to walk.  Starts hurting immediately when she lowers her legs off bed onto floor.  Elevating legs is the only thing that helps.  States she went to clinic and was told they would not see her for these things, that she should come to the ER.  Abdominal Pain  This is a recurrent problem. The current episode started 1 to 4 weeks ago. The problem occurs constantly. The problem has been unchanged. The pain is located in the RUQ. Quality: cannot really describe. The abdominal pain does not radiate. Associated symptoms include constipation, headaches, nausea and vomiting. Pertinent negatives include no diarrhea, dysuria, fever or myalgias. The pain is relieved by nothing. She has tried nothing for the symptoms.  Headache   This is a recurrent problem. The current episode started 1 to 4 weeks ago. The problem occurs constantly. The problem has been unchanged. Pain location: all over. The pain does not radiate. The pain quality is similar to prior headaches. The quality of the pain is  described as aching. Associated symptoms include abdominal pain, nausea, photophobia, tingling (of both feet) and vomiting. Pertinent negatives include no blurred vision, fever, loss of balance, muscle aches or neck pain. Nothing aggravates the symptoms. She has tried nothing for the symptoms. Her past medical history is significant for migraine headaches.    RN Note: Pt here with c/o abdominal pain, headache, leg numbness. Denies any bleeding.     Past Medical History: Past Medical History:  Diagnosis Date  . Anemia   . Anxiety   . Asthma   . Thyroid disease     Past obstetric history: OB History  Gravida Para Term Preterm AB Living  $Remov'3 1 1 'QUcXtJ$ 0 1 1  SAB TAB Ectopic Multiple Live Births  1 0 0 0 1    # Outcome Date GA Lbr Len/2nd Weight Sex Delivery Anes PTL Lv  3 Current           2 Term 12/01/15 [redacted]w[redacted]d 16:59 / 03:13 7 lb 2.5 oz (3.245 kg) M Vag-Spont EPI  LIV     Birth Comments: Hgb, Normal, Fa Newborn Screen Barcode: [id number] Date collected: 12/02/2015  1 SAB 2015             Birth Comments: not seen in hospital, not sure how far she was    Past Surgical History: Past Surgical History:  Procedure Laterality Date  . TOOTH EXTRACTION      Family History: Family History  Problem Relation Age of Onset  . Asthma Father   . Diabetes Maternal Grandmother   .  Hyperlipidemia Paternal Grandfather   . Heart disease Paternal Grandfather   . Cancer - Colon Maternal Grandfather   . Mental illness Neg Hx   . Birth defects Neg Hx   . Kidney disease Neg Hx   . Hypertension Neg Hx     Social History: Social History   Tobacco Use  . Smoking status: Never Smoker  . Smokeless tobacco: Never Used  Substance Use Topics  . Alcohol use: No    Alcohol/week: 0.0 oz  . Drug use: No    Allergies: No Known Allergies  Meds:  Medications Prior to Admission  Medication Sig Dispense Refill Last Dose  . albuterol (PROVENTIL HFA;VENTOLIN HFA) 108 (90 Base) MCG/ACT inhaler Inhale 2  puffs into the lungs every 6 (six) hours as needed for wheezing or shortness of breath.   Taking  . ondansetron (ZOFRAN ODT) 4 MG disintegrating tablet Take 1 tablet (4 mg total) by mouth every 8 (eight) hours as needed for nausea or vomiting. (Patient not taking: Reported on 04/09/2018) 40 tablet 3 Not Taking  . Prenatal Vit-Fe Fumarate-FA (PRENATAL MULTIVITAMIN) TABS tablet Take 1 tablet by mouth daily at 12 noon.   Taking  . promethazine (PHENERGAN) 25 MG suppository Place 1 suppository (25 mg total) rectally every 6 (six) hours as needed for nausea or vomiting. (Patient not taking: Reported on 04/09/2018) 20 each 0 Not Taking  . ranitidine (ZANTAC) 150 MG tablet Take 1 tablet (150 mg total) by mouth 2 (two) times daily. (Patient not taking: Reported on 04/09/2018) 30 tablet 1 Not Taking  . valACYclovir (VALTREX) 1000 MG tablet Take 1 tablet (1,000 mg total) by mouth 2 (two) times daily. (Patient not taking: Reported on 04/09/2018) 10 tablet 5 Not Taking    I have reviewed patient's Past Medical Hx, Surgical Hx, Family Hx, Social Hx, medications and allergies.   ROS:  Review of Systems  Constitutional: Negative for fever.  Eyes: Positive for photophobia. Negative for blurred vision.  Gastrointestinal: Positive for abdominal pain, constipation, nausea and vomiting. Negative for diarrhea.  Genitourinary: Negative for dysuria.  Musculoskeletal: Negative for myalgias and neck pain.  Neurological: Positive for tingling (of both feet) and headaches. Negative for loss of balance.   Other systems negative  Physical Exam   Patient Vitals for the past 24 hrs:  BP Temp Temp src Pulse Resp SpO2 Height Weight  04/24/18 0402 (!) 102/59 98.4 F (36.9 C) Oral 93 18 100 % 5' 2.5" (1.588 m) 126 lb (57.2 kg)   Constitutional: Well-developed, well-nourished female in no acute distress. Does not seem to be bothered by light but states it does bother her with migraines. Cardiovascular: normal rate and  rhythm Respiratory: normal effort, clear to auscultation bilaterally GI: Abd soft, non-tender except for small area RUQ below rib (abdomen not ribcage), gravid appropriate for gestational age.   No rebound or guarding.   MS: Extremities nontender, no edema, normal ROM  Bilateral DP and PT pulses strong and equal.  Normal movement of feet.  No gross deficits noted Neurologic: Alert and oriented x 4.  GU: Neg CVAT.  PELVIC EXAM: deferred   FHT:  145   Labs: A/Positive/-- (03/21 3570) Results for orders placed or performed during the hospital encounter of 04/24/18 (from the past 24 hour(s))  Urinalysis, Routine w reflex microscopic     Status: Abnormal   Collection Time: 04/24/18  4:00 AM  Result Value Ref Range   Color, Urine YELLOW YELLOW   APPearance CLEAR CLEAR   Specific  Gravity, Urine 1.026 1.005 - 1.030   pH 5.0 5.0 - 8.0   Glucose, UA NEGATIVE NEGATIVE mg/dL   Hgb urine dipstick NEGATIVE NEGATIVE   Bilirubin Urine NEGATIVE NEGATIVE   Ketones, ur 5 (A) NEGATIVE mg/dL   Protein, ur NEGATIVE NEGATIVE mg/dL   Nitrite NEGATIVE NEGATIVE   Leukocytes, UA NEGATIVE NEGATIVE  CBC     Status: Abnormal   Collection Time: 04/24/18  5:13 AM  Result Value Ref Range   WBC 11.3 (H) 4.0 - 10.5 K/uL   RBC 3.77 (L) 3.87 - 5.11 MIL/uL   Hemoglobin 11.5 (L) 12.0 - 15.0 g/dL   HCT 33.3 (L) 36.0 - 46.0 %   MCV 88.3 78.0 - 100.0 fL   MCH 30.5 26.0 - 34.0 pg   MCHC 34.5 30.0 - 36.0 g/dL   RDW 14.0 11.5 - 15.5 %   Platelets 186 150 - 400 K/uL  Comprehensive metabolic panel     Status: Abnormal   Collection Time: 04/24/18  5:13 AM  Result Value Ref Range   Sodium 135 135 - 145 mmol/L   Potassium 3.6 3.5 - 5.1 mmol/L   Chloride 103 101 - 111 mmol/L   CO2 20 (L) 22 - 32 mmol/L   Glucose, Bld 75 65 - 99 mg/dL   BUN 12 6 - 20 mg/dL   Creatinine, Ser 0.57 0.44 - 1.00 mg/dL   Calcium 8.6 (L) 8.9 - 10.3 mg/dL   Total Protein 6.4 (L) 6.5 - 8.1 g/dL   Albumin 3.5 3.5 - 5.0 g/dL   AST 24 15 - 41  U/L   ALT 13 (L) 14 - 54 U/L   Alkaline Phosphatase 31 (L) 38 - 126 U/L   Total Bilirubin 0.8 0.3 - 1.2 mg/dL   GFR calc non Af Amer >60 >60 mL/min   GFR calc Af Amer >60 >60 mL/min   Anion gap 12 5 - 15  Lipase, blood     Status: None   Collection Time: 04/24/18  5:13 AM  Result Value Ref Range   Lipase 29 11 - 51 U/L    Imaging:  No results found.  MAU Course/MDM: I have ordered labs and reviewed results.   No overt leukocytosis.  UA negative. CMET and lipase normal  Fioricet ordered for headache, but pt driving, so she declined  Discussed will Rx Fioricet for use at home for migraines Will add Miralax which may help some of the nausea by emptying colon.  Will try to switch to [facility] and decrease use of Zofran to decrease constipation Will see if we can get her into Dr Nehemiah Settle for possible osteopathic treatment for BLE tingling pain.  Assessment: Single IUP at [redacted]w[redacted]d Bilateral lower extremity tingling pain Migraine headaches RUQ pain, possibly due to constipation Nausea and vomiting of pregnancy  Plan: Discharge home Rx Fioricet for migraines Rx miralax for constipation Rx Bonjesta for nausea and vomiting Refer to Dr Nehemiah Settle.  If his treatement does not help, consider referral to ortho or neuro for tingling pain Follow up in Office for prenatal visits and recheck  Encouraged to return here or to other Urgent Care/ED if she develops worsening of symptoms, increase in pain, fever, or other concerning symptoms.   Pt stable at time of discharge.  Hansel Feinstein CNM, MSN Certified Nurse-Midwife 04/24/2018 4:19 AM

## 2018-04-24 NOTE — Discharge Instructions (Signed)
Eating Plan for Pregnant Women While you are pregnant, your body will require additional nutrition to help support your growing baby. It is recommended that you consume:  150 additional calories each day during your first trimester.  300 additional calories each day during your second trimester.  300 additional calories each day during your third trimester.  Eating a healthy, well-balanced diet is very important for your health and for your baby's health. You also have a higher need for some vitamins and minerals, such as folic acid, calcium, iron, and vitamin D. What do I need to know about eating during pregnancy?  Do not try to lose weight or go on a diet during pregnancy.  Choose healthy, nutritious foods. Choose  of a sandwich with a glass of milk instead of a candy bar or a high-calorie sugar-sweetened beverage.  Limit your overall intake of foods that have "empty calories." These are foods that have little nutritional value, such as sweets, desserts, candies, sugar-sweetened beverages, and fried foods.  Eat a variety of foods, especially fruits and vegetables.  Take a prenatal vitamin to help meet the additional needs during pregnancy, specifically for folic acid, iron, calcium, and vitamin D.  Remember to stay active. Ask your health care provider for exercise recommendations that are specific to you.  Practice good food safety and cleanliness, such as washing your hands before you eat and after you prepare raw meat. This helps to prevent foodborne illnesses, such as listeriosis, that can be very dangerous for your baby. Ask your health care provider for more information about listeriosis. What does 150 extra calories look like? Healthy options for an additional 150 calories each day could be any of the following:  Plain low-fat yogurt (6-8 oz) with  cup of berries.  1 apple with 2 teaspoons of peanut butter.  Cut-up vegetables with  cup of hummus.  Low-fat chocolate milk  (8 oz or 1 cup).  1 string cheese with 1 medium orange.   of a peanut butter and jelly sandwich on whole-wheat bread (1 tsp of peanut butter).  For 300 calories, you could eat two of those healthy options each day. What is a healthy amount of weight to gain? The recommended amount of weight for you to gain is based on your pre-pregnancy BMI. If your pre-pregnancy BMI was:  Less than 18 (underweight), you should gain 28-40 lb.  18-24.9 (normal), you should gain 25-35 lb.  25-29.9 (overweight), you should gain 15-25 lb.  Greater than 30 (obese), you should gain 11-20 lb.  What if I am having twins or multiples? Generally, pregnant women who will be having twins or multiples may need to increase their daily calories by 300-600 calories each day. The recommended range for total weight gain is 25-54 lb, depending on your pre-pregnancy BMI. Talk with your health care provider for specific guidance about additional nutritional needs, weight gain, and exercise during your pregnancy. What foods can I eat? Grains Any grains. Try to choose whole grains, such as whole-wheat bread, oatmeal, or brown rice. Vegetables Any vegetables. Try to eat a variety of colors and types of vegetables to get a full range of vitamins and minerals. Remember to wash your vegetables well before eating. Fruits Any fruits. Try to eat a variety of colors and types of fruit to get a full range of vitamins and minerals. Remember to wash your fruits well before eating. Meats and Other Protein Sources Lean meats, including chicken, [place], fish, and lean cuts of beef, veal,  or pork. Make sure that all meats are cooked to "well done." Tofu. Tempeh. Beans. Eggs. Peanut butter and other nut butters. Seafood, such as shrimp, crab, and lobster. If you choose fish, select types that are higher in omega-3 fatty acids, including salmon, herring, mussels, trout, sardines, and pollock. Make sure that all meats are cooked to food-safe  temperatures. Dairy Pasteurized milk and milk alternatives. Pasteurized yogurt and pasteurized cheese. Cottage cheese. Sour cream. Beverages Water. Juices that contain 100% fruit juice or vegetable juice. Caffeine-free teas and decaffeinated coffee. Drinks that contain caffeine are okay to drink, but it is better to avoid caffeine. Keep your total caffeine intake to less than 200 mg each day (12 oz of coffee, tea, or soda) or as directed by your health care provider. Condiments Any pasteurized condiments. Sweets and Desserts Any sweets and desserts. Fats and Oils Any fats and oils. The items listed above may not be a complete list of recommended foods or beverages. Contact your dietitian for more options. What foods are not recommended? Vegetables Unpasteurized (raw) vegetable juices. Fruits Unpasteurized (raw) fruit juices. Meats and Other Protein Sources Cured meats that have nitrates, such as bacon, salami, and hotdogs. Luncheon meats, bologna, or other deli meats (unless they are reheated until they are steaming hot). Refrigerated pate, meat spreads from a meat counter, smoked seafood that is found in the refrigerated section of a store. Raw fish, such as sushi or sashimi. High mercury content fish, such as tilefish, shark, swordfish, and king mackerel. Raw meats, such as tuna or beef tartare. Undercooked meats and poultry. Make sure that all meats are cooked to food-safe temperatures. Dairy Unpasteurized (raw) milk and any foods that have raw milk in them. Soft cheeses, such as feta, queso blanco, queso fresco, Brie, Camembert cheeses, blue-veined cheeses, and Panela cheese (unless it is made with pasteurized milk, which must be stated on the label). Beverages Alcohol. Sugar-sweetened beverages, such as sodas, teas, or energy drinks. Condiments Homemade fermented foods and drinks, such as pickles, sauerkraut, or kombucha drinks. (Store-bought pasteurized versions of these are  okay.) Other Salads that are made in the store, such as ham salad, chicken salad, egg salad, tuna salad, and seafood salad. The items listed above may not be a complete list of foods and beverages to avoid. Contact your dietitian for more information. This information is not intended to replace advice given to you by your health care provider. Make sure you discuss any questions you have with your health care provider. Document Released: 09/23/2014 Document Revised: 05/16/2016 Document Reviewed: 05/24/2014 Elsevier Interactive Patient Education  2018 [employer].   Constipation, Adult Constipation is when a person has fewer bowel movements in a week than normal, has difficulty having a bowel movement, or has stools that are dry, hard, or larger than normal. Constipation may be caused by an underlying condition. It may become worse with age if a person takes certain medicines and does not take in enough fluids. Follow these instructions at home: Eating and drinking   Eat foods that have a lot of fiber, such as fresh fruits and vegetables, whole grains, and beans.  Limit foods that are high in fat, low in fiber, or overly processed, such as french fries, hamburgers, cookies, candies, and soda.  Drink enough fluid to keep your urine clear or pale yellow. General instructions  Exercise regularly or as told by your health care provider.  Go to the restroom when you have the urge to go. Do not hold it  in.  Take over-the-counter and prescription medicines only as told by your health care provider. These include any fiber supplements.  Practice pelvic floor retraining exercises, such as deep breathing while relaxing the lower abdomen and pelvic floor relaxation during bowel movements.  Watch your condition for any changes.  Keep all follow-up visits as told by your health care provider. This is important. Contact a health care provider if:  You have pain that gets worse.  You have a  fever.  You do not have a bowel movement after 4 days.  You vomit.  You are not hungry.  You lose weight.  You are bleeding from the anus.  You have thin, pencil-like stools. Get help right away if:  You have a fever and your symptoms suddenly get worse.  You leak stool or have blood in your stool.  Your abdomen is bloated.  You have severe pain in your abdomen.  You feel dizzy or you faint. This information is not intended to replace advice given to you by your health care provider. Make sure you discuss any questions you have with your health care provider. Document Released: 09/06/2004 Document Revised: 06/28/2016 Document Reviewed: 05/29/2016 Elsevier Interactive Patient Education  2018 [employer]. Paresthesia Paresthesia is an abnormal burning or prickling sensation. This sensation is generally felt in the hands, arms, legs, or feet. However, it may occur in any part of the body. Usually, it is not painful. The feeling may be described as:  Tingling or numbness.  Pins and needles.  Skin crawling.  Buzzing.  Limbs falling asleep.  Itching.  Most people experience temporary (transient) paresthesia at some time in their lives. Paresthesia may occur when you breathe too quickly (hyperventilation). It can also occur without any apparent cause. Commonly, paresthesia occurs when pressure is placed on a nerve. The sensation quickly goes away after the pressure is removed. For some people, however, paresthesia is a long-lasting (chronic) condition that is caused by an underlying disorder. If you continue to have paresthesia, you may need further medical evaluation. Follow these instructions at home: Watch your condition for any changes. Taking the following actions may help to lessen any discomfort that you are feeling:  Avoid drinking alcohol.  Try acupuncture or massage to help relieve your symptoms.  Keep all follow-up visits as directed by your health care  provider. This is important.  Contact a health care provider if:  You continue to have episodes of paresthesia.  Your burning or prickling feeling gets worse when you walk.  You have pain, cramps, or dizziness.  You develop a rash. Get help right away if:  You feel weak.  You have trouble walking or moving.  You have problems with speech, understanding, or vision.  You feel confused.  You cannot control your bladder or bowel movements.  You have numbness after an injury.  You faint. This information is not intended to replace advice given to you by your health care provider. Make sure you discuss any questions you have with your health care provider. Document Released: 11/29/2002 Document Revised: 05/16/2016 Document Reviewed: 12/05/2014 Elsevier Interactive Patient Education  2018 [place] is a daily medication thats easy to take, thanks to its simple dosing schedule: Day 1 Take one Bonjesta tablet by mouth at bedtime. Day 2 Are your symptoms adequately controlled by taking only one tablet at bedtime? YES Continue taking only one tablet daily at bedtime  NO Add a second daily tablet in the morning so that you  are taking two tablets daily: One tablet in the morning  One tablet at bedtime

## 2018-04-24 NOTE — MAU Note (Signed)
Pt here with c/o abdominal pain, headache, leg numbness. Denies any bleeding.

## 2018-05-08 ENCOUNTER — Other Ambulatory Visit (HOSPITAL_COMMUNITY)
Admission: RE | Admit: 2018-05-08 | Discharge: 2018-05-08 | Disposition: A | Discharge status: Home / Self Care | Payer: Medicaid Other | Source: Ambulatory Visit | Attending: Family Medicine | Admitting: Family Medicine

## 2018-05-08 ENCOUNTER — Encounter: Payer: Self-pay | Admitting: Family Medicine

## 2018-05-08 ENCOUNTER — Ambulatory Visit (INDEPENDENT_AMBULATORY_CARE_PROVIDER_SITE_OTHER): Payer: Medicaid Other | Admitting: Family Medicine

## 2018-05-08 VITALS — BP 113/55 | HR 84 | Wt 129.6 lb

## 2018-05-08 DIAGNOSIS — N898 Other specified noninflammatory disorders of vagina: Secondary | ICD-10-CM

## 2018-05-08 DIAGNOSIS — Z3480 Encounter for supervision of other normal pregnancy, unspecified trimester: Secondary | ICD-10-CM

## 2018-05-08 DIAGNOSIS — B009 Herpesviral infection, unspecified: Secondary | ICD-10-CM

## 2018-05-08 DIAGNOSIS — Z3482 Encounter for supervision of other normal pregnancy, second trimester: Secondary | ICD-10-CM

## 2018-05-08 NOTE — Progress Notes (Signed)
Pt wants swab, thinks has BV. Also w/ emesis sees blood.

## 2018-05-08 NOTE — Progress Notes (Signed)
   PRENATAL VISIT NOTE  Subjective:  Angela Branch is a 22 y.o. G3P1011 at [redacted]w[redacted]d being seen today for ongoing prenatal care.  She is currently monitored for the following issues for this low-risk pregnancy and has Asthma; Eczema; GERD (gastroesophageal reflux disease); Adjustment disorder with mixed anxiety and depressed mood; Bacterial vaginitis; Supervision of other normal pregnancy, antepartum; Thyroid disease; and Herpes simplex type 2 infection on their problem list.  Patient reports no complaints.  Contractions: Not present. Vag. Bleeding: None.  Movement: Present. Denies leaking of fluid.   The following portions of the patient's history were reviewed and updated as appropriate: allergies, current medications, past family history, past medical history, past social history, past surgical history and problem list. Problem list updated.  Objective:   Vitals:   05/08/18 1414  BP: (!) 113/55  Pulse: 84  Weight: 129 lb 9.6 oz (58.8 kg)    Fetal Status: Fetal Heart Rate (bpm): 151   Movement: Present     General:  Alert, oriented and cooperative. Patient is in no acute distress.  Skin: Skin is warm and dry. No rash noted.   Cardiovascular: Normal heart rate noted  Respiratory: Normal respiratory effort, no problems with respiration noted  Abdomen: Soft, gravid, appropriate for gestational age.  Pain/Pressure: Present     Pelvic: Cervical exam deferred        Extremities: Normal range of motion.  Edema: Trace  Mental Status: Normal mood and affect. Normal behavior. Normal judgment and thought content.   Assessment and Plan:  Pregnancy: G3P1011 at [redacted]w[redacted]d  1. Vaginal discharge - Cervicovaginal ancillary only - Urine culture d/t urinary hesitancy  2. Supervision of other normal pregnancy, antepartum - Routine care - Anatomy scan next week - Follow up in 4 weeks  3. Herpes simplex type 2 infection Start suppression at 36 weeks  Preterm labor symptoms and general obstetric  precautions including but not limited to vaginal bleeding, contractions, leaking of fluid and fetal movement were reviewed in detail with the patient. Please refer to After Visit Summary for other counseling recommendations.  Return in about 1 month (around 06/05/2018) for LOB.  Future Appointments  Date Time Provider [place]  05/15/2018  3:15 PM [name]    Gailen Shelter, MD

## 2018-05-11 LAB — CERVICOVAGINAL ANCILLARY ONLY
Bacterial vaginitis: POSITIVE — AB
CHLAMYDIA, DNA PROBE: NEGATIVE
Candida vaginitis: NEGATIVE
NEISSERIA GONORRHEA: NEGATIVE
TRICH (WINDOWPATH): NEGATIVE

## 2018-05-13 ENCOUNTER — Other Ambulatory Visit: Payer: Self-pay | Admitting: Family Medicine

## 2018-05-13 MED ORDER — METRONIDAZOLE 500 MG PO TABS: 500.0000 mg | ORAL_TABLET | Freq: Two times a day (BID) | ORAL | 0 refills | Status: DC

## 2018-05-14 ENCOUNTER — Encounter: Payer: Self-pay | Admitting: General Practice

## 2018-05-15 ENCOUNTER — Ambulatory Visit (HOSPITAL_COMMUNITY)
Admission: RE | Admit: 2018-05-15 | Discharge: 2018-05-15 | Disposition: A | Discharge status: Home / Self Care | Payer: Medicaid Other | Source: Ambulatory Visit | Attending: Obstetrics & Gynecology | Admitting: Obstetrics & Gynecology

## 2018-05-15 ENCOUNTER — Other Ambulatory Visit: Payer: Self-pay | Admitting: General Practice

## 2018-05-15 DIAGNOSIS — Z363 Encounter for antenatal screening for malformations: Secondary | ICD-10-CM | POA: Insufficient documentation

## 2018-05-15 DIAGNOSIS — N760 Acute vaginitis: Principal | ICD-10-CM

## 2018-05-15 DIAGNOSIS — Z3480 Encounter for supervision of other normal pregnancy, unspecified trimester: Secondary | ICD-10-CM

## 2018-05-15 DIAGNOSIS — Z3A19 19 weeks gestation of pregnancy: Secondary | ICD-10-CM | POA: Diagnosis not present

## 2018-05-15 DIAGNOSIS — Z3689 Encounter for other specified antenatal screening: Secondary | ICD-10-CM | POA: Insufficient documentation

## 2018-05-15 DIAGNOSIS — B9689 Other specified bacterial agents as the cause of diseases classified elsewhere: Secondary | ICD-10-CM

## 2018-05-15 MED ORDER — METRONIDAZOLE 0.75 % VA GEL: 1.0000 | Freq: Two times a day (BID) | VAGINAL | 0 refills | Status: DC

## 2018-05-20 ENCOUNTER — Encounter: Payer: Self-pay | Admitting: Family Medicine

## 2018-06-08 ENCOUNTER — Ambulatory Visit (INDEPENDENT_AMBULATORY_CARE_PROVIDER_SITE_OTHER): Payer: Medicaid Other | Admitting: Family Medicine

## 2018-06-08 VITALS — BP 114/59 | HR 89 | Wt 133.0 lb

## 2018-06-08 DIAGNOSIS — J4520 Mild intermittent asthma, uncomplicated: Secondary | ICD-10-CM

## 2018-06-08 DIAGNOSIS — B009 Herpesviral infection, unspecified: Secondary | ICD-10-CM

## 2018-06-08 DIAGNOSIS — Z3482 Encounter for supervision of other normal pregnancy, second trimester: Secondary | ICD-10-CM

## 2018-06-08 DIAGNOSIS — Z3480 Encounter for supervision of other normal pregnancy, unspecified trimester: Secondary | ICD-10-CM

## 2018-06-08 DIAGNOSIS — K219 Gastro-esophageal reflux disease without esophagitis: Secondary | ICD-10-CM

## 2018-06-08 NOTE — Progress Notes (Signed)
   PRENATAL VISIT NOTE  Subjective:  Angela Branch is a 22 y.o. G3P1011 at [redacted]w[redacted]d being seen today for ongoing prenatal care.  She is currently monitored for the following issues for this low-risk pregnancy and has Asthma; Eczema; GERD (gastroesophageal reflux disease); Adjustment disorder with mixed anxiety and depressed mood; Bacterial vaginitis; Supervision of other normal pregnancy, antepartum; Thyroid disease; and Herpes simplex type 2 infection on their problem list.  Patient reports occasional round ligament pain.  Contractions: Not present. Vag. Bleeding: None.  Movement: Present. Denies leaking of fluid.   The following portions of the patient's history were reviewed and updated as appropriate: allergies, current medications, past family history, past medical history, past social history, past surgical history and problem list. Problem list updated.  Objective:   Vitals:   06/08/18 1418  BP: (!) 114/59  Pulse: 89  Weight: 133 lb (60.3 kg)    Fetal Status: Fetal Heart Rate (bpm): 156 Fundal Height: 23 cm Movement: Present     General:  Alert, oriented and cooperative. Patient is in no acute distress.  Skin: Skin is warm and dry. No rash noted.   Cardiovascular: Normal heart rate noted  Respiratory: Normal respiratory effort, no problems with respiration noted  Abdomen: Soft, gravid, appropriate for gestational age.  Pain/Pressure: Present     Pelvic: Cervical exam deferred        Extremities: Normal range of motion.  Edema: Trace  Mental Status: Normal mood and affect. Normal behavior. Normal judgment and thought content.   Assessment and Plan:  Pregnancy: G3P1011 at [redacted]w[redacted]d  1. Supervision of other normal pregnancy, antepartum FHT and FH normal  2. Gastroesophageal reflux disease, esophagitis presence not specified Controlled, not on antacid  3. Herpes simplex type 2 infection Prophylaxis at 36 weeks  4. Mild intermittent asthma without complication Controlled off  inhaler  Preterm labor symptoms and general obstetric precautions including but not limited to vaginal bleeding, contractions, leaking of fluid and fetal movement were reviewed in detail with the patient. Please refer to After Visit Summary for other counseling recommendations.  Return in about 1 month (around 07/06/2018) for LR OB f/u, 2 hr GTT.  No future appointments.  Truett Mainland, DO

## 2018-06-09 ENCOUNTER — Encounter: Payer: Self-pay | Admitting: Obstetrics & Gynecology

## 2018-06-19 ENCOUNTER — Inpatient Hospital Stay (HOSPITAL_COMMUNITY)
Admission: AD | Admit: 2018-06-19 | Discharge: 2018-06-19 | Disposition: A | Discharge status: Home / Self Care | Payer: Medicaid Other | Source: Ambulatory Visit | Attending: Obstetrics and Gynecology | Admitting: Obstetrics and Gynecology

## 2018-06-19 ENCOUNTER — Encounter (HOSPITAL_COMMUNITY): Payer: Self-pay | Admitting: *Deleted

## 2018-06-19 DIAGNOSIS — O26892 Other specified pregnancy related conditions, second trimester: Secondary | ICD-10-CM | POA: Diagnosis not present

## 2018-06-19 DIAGNOSIS — O9989 Other specified diseases and conditions complicating pregnancy, childbirth and the puerperium: Secondary | ICD-10-CM | POA: Diagnosis not present

## 2018-06-19 DIAGNOSIS — Z3A24 24 weeks gestation of pregnancy: Secondary | ICD-10-CM | POA: Insufficient documentation

## 2018-06-19 DIAGNOSIS — M545 Low back pain: Secondary | ICD-10-CM | POA: Insufficient documentation

## 2018-06-19 DIAGNOSIS — E860 Dehydration: Secondary | ICD-10-CM | POA: Insufficient documentation

## 2018-06-19 DIAGNOSIS — Z825 Family history of asthma and other chronic lower respiratory diseases: Secondary | ICD-10-CM | POA: Diagnosis not present

## 2018-06-19 DIAGNOSIS — N760 Acute vaginitis: Secondary | ICD-10-CM

## 2018-06-19 DIAGNOSIS — O2652 Maternal hypotension syndrome, second trimester: Secondary | ICD-10-CM | POA: Insufficient documentation

## 2018-06-19 DIAGNOSIS — O99282 Endocrine, nutritional and metabolic diseases complicating pregnancy, second trimester: Secondary | ICD-10-CM | POA: Diagnosis not present

## 2018-06-19 DIAGNOSIS — N949 Unspecified condition associated with female genital organs and menstrual cycle: Secondary | ICD-10-CM | POA: Diagnosis not present

## 2018-06-19 DIAGNOSIS — B9689 Other specified bacterial agents as the cause of diseases classified elsewhere: Secondary | ICD-10-CM

## 2018-06-19 DIAGNOSIS — R102 Pelvic and perineal pain: Secondary | ICD-10-CM | POA: Insufficient documentation

## 2018-06-19 DIAGNOSIS — Z3480 Encounter for supervision of other normal pregnancy, unspecified trimester: Secondary | ICD-10-CM

## 2018-06-19 LAB — URINALYSIS, ROUTINE W REFLEX MICROSCOPIC
BILIRUBIN URINE: NEGATIVE
Glucose, UA: NEGATIVE mg/dL
HGB URINE DIPSTICK: NEGATIVE
Ketones, ur: NEGATIVE mg/dL
Leukocytes, UA: NEGATIVE
Nitrite: NEGATIVE
Protein, ur: NEGATIVE mg/dL
SPECIFIC GRAVITY, URINE: 1.021 (ref 1.005–1.030)
pH: 6 (ref 5.0–8.0)

## 2018-06-19 NOTE — Discharge Instructions (Signed)
Braxton Hicks Contractions Contractions of the uterus can occur throughout pregnancy, but they are not always a sign that you are in labor. You may have practice contractions called Braxton Hicks contractions. These false labor contractions are sometimes confused with true labor. What are [place] contractions? Braxton Hicks contractions are tightening movements that occur in the muscles of the uterus before labor. Unlike true labor contractions, these contractions do not result in opening (dilation) and thinning of the cervix. Toward the end of pregnancy (32-34 weeks), Braxton Hicks contractions can happen more often and may become stronger. These contractions are sometimes difficult to tell apart from true labor because they can be very uncomfortable. You should not feel embarrassed if you go to the hospital with false labor. Sometimes, the only way to tell if you are in true labor is for your health care provider to look for changes in the cervix. The health care provider will do a physical exam and may monitor your contractions. If you are not in true labor, the exam should show that your cervix is not dilating and your water has not broken. If there are other health problems associated with your pregnancy, it is completely safe for you to be sent home with false labor. You may continue to have Braxton Hicks contractions until you go into true labor. How to tell the difference between true labor and false labor True labor  Contractions last 30-70 seconds.  Contractions become very regular.  Discomfort is usually felt in the top of the uterus, and it spreads to the lower abdomen and low back.  Contractions do not go away with walking.  Contractions usually become more intense and increase in frequency.  The cervix dilates and gets thinner. False labor  Contractions are usually shorter and not as strong as true labor contractions.  Contractions are usually irregular.  Contractions  are often felt in the front of the lower abdomen and in the groin.  Contractions may go away when you walk around or change positions while lying down.  Contractions get weaker and are shorter-lasting as time goes on.  The cervix usually does not dilate or become thin. Follow these instructions at home:  Take over-the-counter and prescription medicines only as told by your health care provider.  Keep up with your usual exercises and follow other instructions from your health care provider.  Eat and drink lightly if you think you are going into labor.  If Braxton Hicks contractions are making you uncomfortable: ? Change your position from lying down or resting to walking, or change from walking to resting. ? Sit and rest in a tub of warm water. ? Drink enough fluid to keep your urine pale yellow. Dehydration may cause these contractions. ? Do slow and deep breathing several times an hour.  Keep all follow-up prenatal visits as told by your health care provider. This is important. Contact a health care provider if:  You have a fever.  You have continuous pain in your abdomen. Get help right away if:  Your contractions become stronger, more regular, and closer together.  You have fluid leaking or gushing from your vagina.  You pass blood-tinged mucus (bloody show).  You have bleeding from your vagina.  You have low back pain that you never had before.  You feel your babys head pushing down and causing pelvic pressure.  Your baby is not moving inside you as much as it used to. Summary  Contractions that occur before labor are called Braxton  Hicks contractions, false labor, or practice contractions.  Braxton Hicks contractions are usually shorter, weaker, farther apart, and less regular than true labor contractions. True labor contractions usually become progressively stronger and regular and they become more frequent.  Manage discomfort from [facility] contractions by  changing position, resting in a warm bath, drinking plenty of water, or practicing deep breathing. This information is not intended to replace advice given to you by your health care provider. Make sure you discuss any questions you have with your health care provider. Document Released: 04/24/2017 Document Revised: 04/24/2017 Document Reviewed: 04/24/2017 Elsevier Interactive Patient Education  2018 [employer]. Round Ligament Pain The round ligament is a cord of muscle and tissue that helps to support the uterus. It can become a source of pain during pregnancy if it becomes stretched or twisted as the baby grows. The pain usually begins in the second trimester of pregnancy, and it can come and go until the baby is delivered. It is not a serious problem, and it does not cause harm to the baby. Round ligament pain is usually a short, sharp, and pinching pain, but it can also be a dull, lingering, and aching pain. The pain is felt in the lower side of the abdomen or in the groin. It usually starts deep in the groin and moves up to the outside of the hip area. Pain can occur with:  A sudden change in position.  Rolling over in bed.  Coughing or sneezing.  Physical activity.  Follow these instructions at home: Watch your condition for any changes. Take these steps to help with your pain:  When the pain starts, relax. Then try: ? Sitting down. ? Flexing your knees up to your abdomen. ? Lying on your side with one pillow under your abdomen and another pillow between your legs. ? Sitting in a warm bath for 15-20 minutes or until the pain goes away.  Take over-the-counter and prescription medicines only as told by your health care provider.  Move slowly when you sit and stand.  Avoid long walks if they cause pain.  Stop or lessen your physical activities if they cause pain.  Contact a health care provider if:  Your pain does not go away with treatment.  You feel pain in your back that  you did not have before.  Your medicine is not helping. Get help right away if:  You develop a fever or chills.  You develop uterine contractions.  You develop vaginal bleeding.  You develop nausea or vomiting.  You develop diarrhea.  You have pain when you urinate. This information is not intended to replace advice given to you by your health care provider. Make sure you discuss any questions you have with your health care provider. Document Released: 09/17/2008 Document Revised: 05/16/2016 Document Reviewed: 02/15/2015 Elsevier Interactive Patient Education  [employer].

## 2018-06-19 NOTE — MAU Note (Signed)
Pt present to MAU with complaints of vaginal, abdominal pain.  Leg numbness and tingling.  Last intercourse  Weeks ago.  Denies vaginal bleeding, LOF or abnormal d/c

## 2018-06-19 NOTE — MAU Note (Signed)
Urine in lab 

## 2018-06-19 NOTE — MAU Provider Note (Signed)
History     CSN: [id number]  Arrival date and time: 06/19/18 1333   First Provider Initiated Contact with Patient 06/19/18 1416      Chief Complaint  Patient presents with  . Abdominal Pain  . Pelvic Pain   HPI [name] is a 22 y.o. G3P1011 at [redacted]w[redacted]d who presents to MAU today with complaint of pelvic and low back pain. She states this has been a constant pain and pressure for weeks that has not been addressed at her office visits. She denies abnormal discharge, but "feels wet" often for the last week. She was treated for BV last month and completed the Flagyl as directed. She denies vaginal bleeding, diarrhea, consipation or UTI symptoms. She has frequent N/V and takes Zofran PRN with relief.   OB History    Gravida  3   Para  1   Term  1   Preterm  0   AB  1   Living  1     SAB  1   TAB  0   Ectopic  0   Multiple  0   Live Births  1           Past Medical History:  Diagnosis Date  . Anemia   . Anxiety   . Asthma   . Thyroid disease     Past Surgical History:  Procedure Laterality Date  . TOOTH EXTRACTION      Family History  Problem Relation Age of Onset  . Asthma Father   . Diabetes Maternal Grandmother   . Hyperlipidemia Paternal Grandfather   . Heart disease Paternal Grandfather   . Cancer - Colon Maternal Grandfather   . Mental illness Neg Hx   . Birth defects Neg Hx   . Kidney disease Neg Hx   . Hypertension Neg Hx     Social History   Tobacco Use  . Smoking status: Never Smoker  . Smokeless tobacco: Never Used  Substance Use Topics  . Alcohol use: No    Alcohol/week: 0.0 oz  . Drug use: No    Allergies: No Known Allergies  No medications prior to admission.    Review of Systems  Constitutional: Negative for fever.  Gastrointestinal: Positive for abdominal pain, nausea and vomiting. Negative for constipation and diarrhea.  Genitourinary: Positive for pelvic pain and vaginal discharge. Negative for dysuria,  frequency, urgency and vaginal bleeding.  Musculoskeletal: Positive for back pain.   Physical Exam   Blood pressure (!) 110/58, pulse (!) 103, temperature 98.7 F (37.1 C), resp. rate 16, height 5\' 3"  (1.6 m), weight 134 lb 0.6 oz (60.8 kg), last menstrual period 12/31/2017.  Physical Exam  Nursing note and vitals reviewed. Constitutional: She is oriented to person, place, and time. She appears well-developed and well-nourished. No distress.  HENT:  Head: Normocephalic and atraumatic.  Cardiovascular: Normal rate.  Respiratory: Effort normal.  GI: Soft. She exhibits no distension and no mass. There is no tenderness. There is no rebound and no guarding.  Genitourinary: Uterus is enlarged. Uterus is not tender. Cervix exhibits no motion tenderness, no discharge and no friability. No bleeding in the vagina. Vaginal discharge (scant, thin, white without odor. No pooling) found.  Neurological: She is alert and oriented to person, place, and time.  Skin: Skin is warm and dry. No erythema.  Psychiatric: She has a normal mood and affect.  Dilation: Closed Effacement (%): Thick Cervical Position: Posterior Exam by:: [email or web address], PA-C   Results for  orders placed or performed during the hospital encounter of 06/19/18 (from the past 24 hour(s))  Urinalysis, Routine w reflex microscopic     Status: Abnormal   Collection Time: 06/19/18  1:44 PM  Result Value Ref Range   Color, Urine YELLOW YELLOW   APPearance HAZY (A) CLEAR   Specific Gravity, Urine 1.021 1.005 - 1.030   pH 6.0 5.0 - 8.0   Glucose, UA NEGATIVE NEGATIVE mg/dL   Hgb urine dipstick NEGATIVE NEGATIVE   Bilirubin Urine NEGATIVE NEGATIVE   Ketones, ur NEGATIVE NEGATIVE mg/dL   Protein, ur NEGATIVE NEGATIVE mg/dL   Nitrite NEGATIVE NEGATIVE   Leukocytes, UA NEGATIVE NEGATIVE    Fetal Monitoring: Baseline: 145 bpm Variability: moderate Accelerations: 10 x 10 Decelerations: none Contractions: none   MAU Course   Procedures None  MDM UA today  Discussed at length options for management of pain. Patient declines pain medication at this time.   Assessment and Plan  A: SIUP at [redacted]w[redacted]d Round ligament pain Maternal hypotension Dehydration   P: Discharge home Tylenol PRN for pain encouraged  Abdominal binder, hydrotherapy encouraged  Second trimester precautions discussed Patient advised to follow-up with [facility] as scheduled for routine prenatal care  Patient may return to MAU as needed or if her condition were to change or worsen   Kerry Hough, PA-C 06/19/2018, 3:44 PM

## 2018-07-06 ENCOUNTER — Other Ambulatory Visit: Payer: Self-pay | Admitting: *Deleted

## 2018-07-06 DIAGNOSIS — Z3480 Encounter for supervision of other normal pregnancy, unspecified trimester: Secondary | ICD-10-CM

## 2018-07-08 ENCOUNTER — Other Ambulatory Visit: Payer: Medicaid Other

## 2018-07-08 ENCOUNTER — Ambulatory Visit (INDEPENDENT_AMBULATORY_CARE_PROVIDER_SITE_OTHER): Payer: Medicaid Other | Admitting: Family Medicine

## 2018-07-08 ENCOUNTER — Other Ambulatory Visit (HOSPITAL_COMMUNITY)
Admission: RE | Admit: 2018-07-08 | Discharge: 2018-07-08 | Disposition: A | Discharge status: Home / Self Care | Payer: Medicaid Other | Source: Ambulatory Visit | Attending: Family Medicine | Admitting: Family Medicine

## 2018-07-08 VITALS — BP 106/65 | HR 75 | Wt 136.4 lb

## 2018-07-08 DIAGNOSIS — K5900 Constipation, unspecified: Secondary | ICD-10-CM

## 2018-07-08 DIAGNOSIS — N898 Other specified noninflammatory disorders of vagina: Secondary | ICD-10-CM

## 2018-07-08 DIAGNOSIS — J4520 Mild intermittent asthma, uncomplicated: Secondary | ICD-10-CM

## 2018-07-08 DIAGNOSIS — Z3480 Encounter for supervision of other normal pregnancy, unspecified trimester: Secondary | ICD-10-CM | POA: Diagnosis not present

## 2018-07-08 DIAGNOSIS — B009 Herpesviral infection, unspecified: Secondary | ICD-10-CM

## 2018-07-08 DIAGNOSIS — Z87898 Personal history of other specified conditions: Secondary | ICD-10-CM

## 2018-07-08 DIAGNOSIS — F1291 Cannabis use, unspecified, in remission: Secondary | ICD-10-CM

## 2018-07-08 MED ORDER — SENNA 8.6 MG PO TABS: 1.0000 | ORAL_TABLET | Freq: Every day | ORAL | Status: DC

## 2018-07-08 MED ORDER — DOCUSATE SODIUM 100 MG PO CAPS: 100.0000 mg | ORAL_CAPSULE | Freq: Two times a day (BID) | ORAL | 1 refills | Status: DC

## 2018-07-08 NOTE — Progress Notes (Signed)
Declines tdap today, maybe next visit.

## 2018-07-08 NOTE — Patient Instructions (Signed)

## 2018-07-08 NOTE — Progress Notes (Signed)
   PRENATAL VISIT NOTE  Subjective:  Angela Branch is a 22 y.o. G3P1011 at [redacted]w[redacted]d being seen today for ongoing prenatal care.  She is currently monitored for the following issues for this low-risk pregnancy and has Asthma; Eczema; GERD (gastroesophageal reflux disease); Adjustment disorder with mixed anxiety and depressed mood; Bacterial vaginitis; Supervision of other normal pregnancy, antepartum; Thyroid disease; and Herpes simplex type 2 infection on their problem list.  Patient reports vaginal irritation, and feeling a lot of pressure, reports feeling like "baby is about to come out"; tried support belt w/o improvement.  Contractions: Not present. Vag. Bleeding: None.  Movement: Present. Denies leaking of fluid.   The following portions of the patient's history were reviewed and updated as appropriate: allergies, current medications, past family history, past medical history, past social history, past surgical history and problem list. Problem list updated.  Objective:   Vitals:   07/08/18 0933  BP: 106/65  Pulse: 75  Weight: 136 lb 6.4 oz (61.9 kg)    Fetal Status: Fetal Heart Rate (bpm): 150   Movement: Present     General:  Alert, oriented and cooperative. Patient is in no acute distress.  Skin: Skin is warm and dry. No rash noted.   Cardiovascular: Normal heart rate noted  Respiratory: Normal respiratory effort, no problems with respiration noted  Abdomen: Soft, gravid, appropriate for gestational age.  Pain/Pressure: Present     Pelvic: Cervical exam performed      on speculum exam, cervix long and closed. On bimanual exam external os FT; some tenderness on posterior vaginal wall, with some palpable stool  Extremities: Normal range of motion.  Edema: Trace  Mental Status: Normal mood and affect. Normal behavior. Normal judgment and thought content.   Assessment and Plan:  Pregnancy: G3P1011 at [redacted]w[redacted]d  1. Supervision of other normal pregnancy, antepartum 28 week labs  today Reviewed last ultrasound which recommended f/u in 4 weeks; ordered  2. Herpes simplex type 2 infection Start suppression by 35-36 weeks  3. Mild intermittent asthma without complication Asymptomatic  4. Vaginal discharge Vaginal swab done Also having pressure, but exam reassuring; cervix long and closed on speculum exam  5. Constipation This may be contributing to her symptoms of pressure.  - Start bowel regimen  Preterm labor symptoms and general obstetric precautions including but not limited to vaginal bleeding, contractions, leaking of fluid and fetal movement were reviewed in detail with the patient. Please refer to After Visit Summary for other counseling recommendations.  Return in about 2 weeks (around 07/22/2018) for LOB.  Future Appointments  Date Time Provider [place]  07/21/2018  3:35 PM Danielle Rankin [facility]  07/28/2018  8:45 AM [name] [facility]    Gailen Shelter, MD

## 2018-07-09 ENCOUNTER — Encounter: Payer: Self-pay | Admitting: Medical

## 2018-07-09 LAB — CERVICOVAGINAL ANCILLARY ONLY
Bacterial vaginitis: POSITIVE — AB
Candida vaginitis: NEGATIVE
Chlamydia: NEGATIVE
NEISSERIA GONORRHEA: NEGATIVE
TRICH (WINDOWPATH): NEGATIVE

## 2018-07-09 LAB — RPR: RPR Ser Ql: NONREACTIVE

## 2018-07-09 LAB — CBC
HEMOGLOBIN: 11.6 g/dL (ref 11.1–15.9)
Hematocrit: 34.9 % (ref 34.0–46.6)
MCH: 30.4 pg (ref 26.6–33.0)
MCHC: 33.2 g/dL (ref 31.5–35.7)
MCV: 92 fL (ref 79–97)
Platelets: 183 10*3/uL (ref 150–450)
RBC: 3.81 x10E6/uL (ref 3.77–5.28)
RDW: 13.8 % (ref 12.3–15.4)
WBC: 9.6 10*3/uL (ref 3.4–10.8)

## 2018-07-09 LAB — GLUCOSE TOLERANCE, 2 HOURS W/ 1HR
GLUCOSE, FASTING: 80 mg/dL (ref 65–91)
Glucose, 1 hour: 126 mg/dL (ref 65–179)
Glucose, 2 hour: 104 mg/dL (ref 65–152)

## 2018-07-09 LAB — HIV ANTIBODY (ROUTINE TESTING W REFLEX): HIV Screen 4th Generation wRfx: NONREACTIVE

## 2018-07-16 ENCOUNTER — Ambulatory Visit (HOSPITAL_COMMUNITY)
Admission: RE | Admit: 2018-07-16 | Discharge: 2018-07-16 | Disposition: A | Discharge status: Home / Self Care | Payer: Medicaid Other | Source: Ambulatory Visit | Attending: Family Medicine | Admitting: Family Medicine

## 2018-07-16 ENCOUNTER — Other Ambulatory Visit: Payer: Self-pay | Admitting: Family Medicine

## 2018-07-16 DIAGNOSIS — Z362 Encounter for other antenatal screening follow-up: Secondary | ICD-10-CM | POA: Diagnosis not present

## 2018-07-16 DIAGNOSIS — F1291 Cannabis use, unspecified, in remission: Secondary | ICD-10-CM

## 2018-07-16 DIAGNOSIS — O99323 Drug use complicating pregnancy, third trimester: Secondary | ICD-10-CM | POA: Diagnosis not present

## 2018-07-16 DIAGNOSIS — Z3480 Encounter for supervision of other normal pregnancy, unspecified trimester: Secondary | ICD-10-CM

## 2018-07-16 DIAGNOSIS — F1290 Cannabis use, unspecified, uncomplicated: Secondary | ICD-10-CM | POA: Insufficient documentation

## 2018-07-16 DIAGNOSIS — Z87898 Personal history of other specified conditions: Secondary | ICD-10-CM

## 2018-07-16 DIAGNOSIS — Z3A28 28 weeks gestation of pregnancy: Secondary | ICD-10-CM | POA: Diagnosis not present

## 2018-07-17 ENCOUNTER — Other Ambulatory Visit: Payer: Self-pay | Admitting: Family Medicine

## 2018-07-17 MED ORDER — CLINDAMYCIN PHOSPHATE 2 % VA CREA: 1.0000 | TOPICAL_CREAM | Freq: Every day | VAGINAL | 0 refills | Status: DC

## 2018-07-20 ENCOUNTER — Telehealth: Payer: Self-pay | Admitting: *Deleted

## 2018-07-20 NOTE — Telephone Encounter (Signed)
-----   Message from Gailen Shelter, MD sent at 07/17/2018  5:58 PM EDT ----- BV. Clinda rx sent to pharmacy (pt with poor response to metronidazole)

## 2018-07-20 NOTE — Telephone Encounter (Signed)
Will send my chart message to patient.

## 2018-07-21 ENCOUNTER — Ambulatory Visit (INDEPENDENT_AMBULATORY_CARE_PROVIDER_SITE_OTHER): Payer: Medicaid Other | Admitting: Medical

## 2018-07-21 VITALS — BP 128/66 | HR 86 | Wt 138.0 lb

## 2018-07-21 DIAGNOSIS — Z23 Encounter for immunization: Secondary | ICD-10-CM | POA: Diagnosis not present

## 2018-07-21 DIAGNOSIS — N760 Acute vaginitis: Secondary | ICD-10-CM | POA: Diagnosis not present

## 2018-07-21 DIAGNOSIS — B9689 Other specified bacterial agents as the cause of diseases classified elsewhere: Secondary | ICD-10-CM

## 2018-07-21 DIAGNOSIS — Z3483 Encounter for supervision of other normal pregnancy, third trimester: Secondary | ICD-10-CM

## 2018-07-21 DIAGNOSIS — B009 Herpesviral infection, unspecified: Secondary | ICD-10-CM

## 2018-07-21 DIAGNOSIS — E079 Disorder of thyroid, unspecified: Secondary | ICD-10-CM

## 2018-07-21 DIAGNOSIS — Z3480 Encounter for supervision of other normal pregnancy, unspecified trimester: Secondary | ICD-10-CM

## 2018-07-21 MED ORDER — CLINDAMYCIN PHOSPHATE 2 % VA CREA: 1.0000 | TOPICAL_CREAM | Freq: Every day | VAGINAL | 0 refills | Status: DC

## 2018-07-21 MED ORDER — METRONIDAZOLE 0.75 % EX GEL: CUTANEOUS | 0 refills | Status: DC

## 2018-07-21 NOTE — Patient Instructions (Signed)
Research childbirth classes and hospital preregistration at [email or web address]  Fetal Movement Counts Patient Name: ________________________________________________ Patient Due Date: ____________________ What is a fetal movement count? A fetal movement count is the number of times that you feel your baby move during a certain amount of time. This may also be called a fetal kick count. A fetal movement count is recommended for every pregnant woman. You may be asked to start counting fetal movements as early as week 28 of your pregnancy. Pay attention to when your baby is most active. You may notice your baby's sleep and wake cycles. You may also notice things that make your baby move more. You should do a fetal movement count:  When your baby is normally most active.  At the same time each day.  A good time to count movements is while you are resting, after having something to eat and drink. How do I count fetal movements? 1. Find a quiet, comfortable area. Sit, or lie down on your side. 2. Write down the date, the start time and stop time, and the number of movements that you felt between those two times. Take this information with you to your health care visits. 3. For 2 hours, count kicks, flutters, swishes, rolls, and jabs. You should feel at least 10 movements during 2 hours. 4. You may stop counting after you have felt 10 movements. 5. If you do not feel 10 movements in 2 hours, have something to eat and drink. Then, keep resting and counting for 1 hour. If you feel at least 4 movements during that hour, you may stop counting. Contact a health care provider if:  You feel fewer than 4 movements in 2 hours.  Your baby is not moving like he or she usually does. Date: ____________ Start time: ____________ Stop time: ____________ Movements: ____________ Date: ____________ Start time: ____________ Stop time: ____________ Movements: ____________ Date: ____________ Start time: ____________  Stop time: ____________ Movements: ____________ Date: ____________ Start time: ____________ Stop time: ____________ Movements: ____________ Date: ____________ Start time: ____________ Stop time: ____________ Movements: ____________ Date: ____________ Start time: ____________ Stop time: ____________ Movements: ____________ Date: ____________ Start time: ____________ Stop time: ____________ Movements: ____________ Date: ____________ Start time: ____________ Stop time: ____________ Movements: ____________ Date: ____________ Start time: ____________ Stop time: ____________ Movements: ____________ This information is not intended to replace advice given to you by your health care provider. Make sure you discuss any questions you have with your health care provider. Document Released: 01/08/2007 Document Revised: 08/07/2016 Document Reviewed: 01/18/2016 Elsevier Interactive Patient Education  2018 [employer].  [employer] of the uterus can occur throughout pregnancy, but they are not always a sign that you are in labor. You may have practice contractions called Braxton Hicks contractions. These false labor contractions are sometimes confused with true labor. What are [place] contractions? Braxton Hicks contractions are tightening movements that occur in the muscles of the uterus before labor. Unlike true labor contractions, these contractions do not result in opening (dilation) and thinning of the cervix. Toward the end of pregnancy (32-34 weeks), Braxton Hicks contractions can happen more often and may become stronger. These contractions are sometimes difficult to tell apart from true labor because they can be very uncomfortable. You should not feel embarrassed if you go to the hospital with false labor. Sometimes, the only way to tell if you are in true labor is for your health care provider to look for changes in the cervix. The health care provider will  do a physical  exam and may monitor your contractions. If you are not in true labor, the exam should show that your cervix is not dilating and your water has not broken. If there are other health problems associated with your pregnancy, it is completely safe for you to be sent home with false labor. You may continue to have Braxton Hicks contractions until you go into true labor. How to tell the difference between true labor and false labor True labor  Contractions last 30-70 seconds.  Contractions become very regular.  Discomfort is usually felt in the top of the uterus, and it spreads to the lower abdomen and low back.  Contractions do not go away with walking.  Contractions usually become more intense and increase in frequency.  The cervix dilates and gets thinner. False labor  Contractions are usually shorter and not as strong as true labor contractions.  Contractions are usually irregular.  Contractions are often felt in the front of the lower abdomen and in the groin.  Contractions may go away when you walk around or change positions while lying down.  Contractions get weaker and are shorter-lasting as time goes on.  The cervix usually does not dilate or become thin. Follow these instructions at home:  Take over-the-counter and prescription medicines only as told by your health care provider.  Keep up with your usual exercises and follow other instructions from your health care provider.  Eat and drink lightly if you think you are going into labor.  If Braxton Hicks contractions are making you uncomfortable: ? Change your position from lying down or resting to walking, or change from walking to resting. ? Sit and rest in a tub of warm water. ? Drink enough fluid to keep your urine pale yellow. Dehydration may cause these contractions. ? Do slow and deep breathing several times an hour.  Keep all follow-up prenatal visits as told by your health care provider. This is  important. Contact a health care provider if:  You have a fever.  You have continuous pain in your abdomen. Get help right away if:  Your contractions become stronger, more regular, and closer together.  You have fluid leaking or gushing from your vagina.  You pass blood-tinged mucus (bloody show).  You have bleeding from your vagina.  You have low back pain that you never had before.  You feel your baby's head pushing down and causing pelvic pressure.  Your baby is not moving inside you as much as it used to. Summary  Contractions that occur before labor are called Braxton Hicks contractions, false labor, or practice contractions.  Braxton Hicks contractions are usually shorter, weaker, farther apart, and less regular than true labor contractions. True labor contractions usually become progressively stronger and regular and they become more frequent.  Manage discomfort from [facility] contractions by changing position, resting in a warm bath, drinking plenty of water, or practicing deep breathing. This information is not intended to replace advice given to you by your health care provider. Make sure you discuss any questions you have with your health care provider. Document Released: 04/24/2017 Document Revised: 04/24/2017 Document Reviewed: 04/24/2017 Elsevier Interactive Patient Education  2018 [employer].

## 2018-07-21 NOTE — Progress Notes (Signed)
   PRENATAL VISIT NOTE  Subjective:  Angela Branch is a 22 y.o. G3P1011 at [redacted]w[redacted]d being seen today for ongoing prenatal care.  She is currently monitored for the following issues for this high-risk pregnancy and has Asthma; Eczema; GERD (gastroesophageal reflux disease); Adjustment disorder with mixed anxiety and depressed mood; Bacterial vaginitis; Supervision of other normal pregnancy, antepartum; Thyroid disease; and Herpes simplex type 2 infection on their problem list.  Patient reports unable to afford clindamycin gel and frustrated with recurrent BV.  Contractions: Not present. Vag. Bleeding: None.  Movement: Present. Denies leaking of fluid.   The following portions of the patient's history were reviewed and updated as appropriate: allergies, current medications, past family history, past medical history, past social history, past surgical history and problem list. Problem list updated.  Objective:   Vitals:   07/21/18 1615  BP: 128/66  Pulse: 86  Weight: 138 lb (62.6 kg)    Fetal Status: Fetal Heart Rate (bpm): 128 Fundal Height: 29 cm Movement: Present     General:  Alert, oriented and cooperative. Patient is in no acute distress.  Skin: Skin is warm and dry. No rash noted.   Cardiovascular: Normal heart rate noted  Respiratory: Normal respiratory effort, no problems with respiration noted  Abdomen: Soft, gravid, appropriate for gestational age.  Pain/Pressure: Present     Pelvic: Cervical exam deferred        Extremities: Normal range of motion.  Edema: Trace  Mental Status: Normal mood and affect. Normal behavior. Normal judgment and thought content.   Assessment and Plan:  Pregnancy: G3P1011 at [redacted]w[redacted]d  1. Supervision of other normal pregnancy, antepartum - Tdap vaccine greater than or equal to 7yo IM - GTT normal at last visit, discussed with patient   2. Herpes simplex type 2 infection - Will need to start Valtrex at 35 weeks  3. Thyroid disease - TSH normal at  NOB, plan to repeat at next visit, patient refused today since she was getting TDap injection today   4. BV (bacterial vaginosis) - metroNIDAZOLE (METROGEL) 0.75 % gel; Insert one (1) applicator at bedtime for 5 nights  Dispense: 45 g; Refill: 0 - clindamycin (CLEOCIN) 2 % vaginal cream; Place 1 Applicatorful vaginally at bedtime.  Dispense: 40 g; Refill: 0 - Cleocin printed today with coupon to see if patient can find more affordable option, if not able to afford Cleocin, Metrogel sent to pharmacy. If using Metrogel, will need to consider twice weekly suppression therapy due to recurrence.  - Discuss use of probiotics, condom use and hygiene products for avoiding recurrence  Preterm labor symptoms and general obstetric precautions including but not limited to vaginal bleeding, contractions, leaking of fluid and fetal movement were reviewed in detail with the patient. Please refer to After Visit Summary for other counseling recommendations.  Return in about 2 weeks (around 08/04/2018) for LOB.  Future Appointments  Date Time Provider [place]  08/04/2018  4:40 PM Ephraim Hamburger, Rona Ravens, NP [facility]    Kerry Hough, PA-C

## 2018-07-22 ENCOUNTER — Encounter: Payer: Self-pay | Admitting: Medical

## 2018-07-28 ENCOUNTER — Ambulatory Visit (HOSPITAL_COMMUNITY): Payer: Medicaid Other

## 2018-07-29 ENCOUNTER — Telehealth: Payer: Self-pay

## 2018-07-29 DIAGNOSIS — B9689 Other specified bacterial agents as the cause of diseases classified elsewhere: Secondary | ICD-10-CM

## 2018-07-29 DIAGNOSIS — N760 Acute vaginitis: Principal | ICD-10-CM

## 2018-07-29 NOTE — Telephone Encounter (Signed)
Pt called stating the need for prior authorization for her medication.

## 2018-07-30 NOTE — Telephone Encounter (Signed)
Contacted [employer] for prior auth for Clindamycin 2% cream (Ref # [id number]) ([place] # [id number]) and was informed to return call back in 24 hrs to find out if pt was approved or denied.  LM for pt that the medication that she has requested we are waiting for approval or denial in which we should know something by end of day tomorrow.  If she has any questions to please give the office a call.

## 2018-08-04 ENCOUNTER — Other Ambulatory Visit (HOSPITAL_COMMUNITY)
Admission: RE | Admit: 2018-08-04 | Discharge: 2018-08-04 | Disposition: A | Discharge status: Home / Self Care | Payer: Medicaid Other | Source: Ambulatory Visit | Attending: Nurse Practitioner | Admitting: Nurse Practitioner

## 2018-08-04 ENCOUNTER — Encounter: Payer: Self-pay | Admitting: General Practice

## 2018-08-04 ENCOUNTER — Ambulatory Visit (INDEPENDENT_AMBULATORY_CARE_PROVIDER_SITE_OTHER): Payer: Medicaid Other | Admitting: Nurse Practitioner

## 2018-08-04 VITALS — BP 123/67 | HR 82 | Wt 140.0 lb

## 2018-08-04 DIAGNOSIS — O26899 Other specified pregnancy related conditions, unspecified trimester: Secondary | ICD-10-CM

## 2018-08-04 DIAGNOSIS — Z3480 Encounter for supervision of other normal pregnancy, unspecified trimester: Secondary | ICD-10-CM

## 2018-08-04 DIAGNOSIS — N898 Other specified noninflammatory disorders of vagina: Secondary | ICD-10-CM | POA: Diagnosis not present

## 2018-08-04 DIAGNOSIS — Z3A30 30 weeks gestation of pregnancy: Secondary | ICD-10-CM | POA: Insufficient documentation

## 2018-08-04 MED ORDER — COMFORT FIT MATERNITY SUPP SM MISC: 0 refills | Status: DC

## 2018-08-04 NOTE — Progress Notes (Signed)
    Subjective:  Angela Branch is a 22 y.o. G3P1011 at [redacted]w[redacted]d being seen today for ongoing prenatal care.  She is currently monitored for the following issues for this low-risk pregnancy and has Asthma; Eczema; GERD (gastroesophageal reflux disease); Adjustment disorder with mixed anxiety and depressed mood; Bacterial vaginitis; Supervision of other normal pregnancy, antepartum; Thyroid disease; and Herpes simplex type 2 infection on their problem list.  Patient reports cramping and loss of mucus plug. Loss of mucus plug was yesterday and noted a small amount of blood in thick mucus.  Contractions: Irritability. Vag. Bleeding: None.  Movement: Present. Denies leaking of fluid.   The following portions of the patient's history were reviewed and updated as appropriate: allergies, current medications, past family history, past medical history, past social history, past surgical history and problem list. Problem list updated.  Objective:   Vitals:   08/04/18 1716  BP: 123/67  Pulse: 82  Weight: 140 lb (63.5 kg)    Fetal Status: Fetal Heart Rate (bpm): 158 Fundal Height: 29 cm Movement: Present  Presentation: Vertex  General:  Alert, oriented and cooperative. Patient is in no acute distress.  Skin: Skin is warm and dry. No rash noted.   Cardiovascular: Normal heart rate noted  Respiratory: Normal respiratory effort, no problems with respiration noted  Abdomen: Soft, gravid, appropriate for gestational age. Pain/Pressure: Present     Pelvic:  speculum exam done.  Can barely see cervix.  Moderate amount of white vaginal discharge.  Gc/Chlam done. Dilation: Fingertip    firm in texture, no blood seen, no pooling of amniotic fluid  Extremities: Normal range of motion.  Edema: Trace  Mental Status: Normal mood and affect. Normal behavior. Normal judgment and thought content.   Urinalysis:      Assessment and Plan:  Pregnancy: G3P1011 at [redacted]w[redacted]d  1. Supervision of other normal pregnancy,  antepartum NST done due to reports of cramping and pain in her left groin. FHT baseline of 145 with 10x10 accels noted, no decelerations, uterine irritability noted but it resolved with rest during the NST.  No labor contractions seen.  No decelerations.  Reassuring NST. Note  For work given saying she can have frequent bathroom breaks and can wear comfortable clothing to work. Strongly advised to come to MAU if having worsening cramping or any vaginal bleeding.  Preterm labor symptoms and general obstetric precautions including but not limited to vaginal bleeding, contractions, leaking of fluid and fetal movement were reviewed in detail with the patient. Please refer to After Visit Summary for other counseling recommendations.  Return in about 1 week (around 08/11/2018).  Earlie Server, RN, MSN, NP-BC Nurse Practitioner, [facility] for [employer], [place] [employer] 08/04/2018 8:17 PM

## 2018-08-04 NOTE — Patient Instructions (Signed)
Return to [facility] Admissions if your contractions get stronger or if you have any leaking of fluid or any vaginal bleeding.  Drink at least 8 8-oz glasses of water every day.

## 2018-08-05 MED ORDER — METRONIDAZOLE 0.75 % EX GEL: CUTANEOUS | 0 refills | Status: DC

## 2018-08-05 NOTE — Telephone Encounter (Signed)
Called [employer] to f/u with pt's prio auth and I was informed that they will have to send it to their medical director and the process can take up to 15 days.   Notified the pt and pt stated if we can go back to the Metrogel again because she does not want to wait 15 days for a denial.  I advised to the pt that I will e-prescribe Metrogel again and call her pharmacy in the am to make sure it was approved and then I will call her back.  Pt stated thank you.

## 2018-08-06 LAB — CERVICOVAGINAL ANCILLARY ONLY
Bacterial vaginitis: NEGATIVE
CANDIDA VAGINITIS: NEGATIVE
Chlamydia: NEGATIVE
Neisseria Gonorrhea: NEGATIVE
TRICH (WINDOWPATH): NEGATIVE

## 2018-08-06 NOTE — Telephone Encounter (Signed)
Called [facility] pharmacy and spoke with Jeneen Rinks, pharmacist, in regards to pt's Metrogel prescription.  I was informed that the medication needed to be changed from 45g to 70g.  Jeneen Rinks did corrections and informed me that the pt would be able to come pick up the medication.  Notified pt that she can go to [facility] on [address].  Pt stated thank you with no further questions.

## 2018-08-13 ENCOUNTER — Other Ambulatory Visit: Payer: Self-pay

## 2018-08-13 ENCOUNTER — Inpatient Hospital Stay (HOSPITAL_COMMUNITY)
Admission: AD | Admit: 2018-08-13 | Discharge: 2018-08-17 | DRG: 832 | Discharge status: Against Medical Advice | Payer: Medicaid Other | Attending: Obstetrics and Gynecology | Admitting: Obstetrics and Gynecology

## 2018-08-13 ENCOUNTER — Encounter (HOSPITAL_COMMUNITY): Payer: Self-pay

## 2018-08-13 ENCOUNTER — Inpatient Hospital Stay (HOSPITAL_BASED_OUTPATIENT_CLINIC_OR_DEPARTMENT_OTHER): Payer: Medicaid Other

## 2018-08-13 DIAGNOSIS — Z5321 Procedure and treatment not carried out due to patient leaving prior to being seen by health care provider: Secondary | ICD-10-CM | POA: Diagnosis present

## 2018-08-13 DIAGNOSIS — O26893 Other specified pregnancy related conditions, third trimester: Secondary | ICD-10-CM | POA: Diagnosis not present

## 2018-08-13 DIAGNOSIS — N898 Other specified noninflammatory disorders of vagina: Secondary | ICD-10-CM

## 2018-08-13 DIAGNOSIS — Z3A32 32 weeks gestation of pregnancy: Secondary | ICD-10-CM | POA: Diagnosis not present

## 2018-08-13 DIAGNOSIS — Y92230 Patient room in hospital as the place of occurrence of the external cause: Secondary | ICD-10-CM | POA: Diagnosis not present

## 2018-08-13 DIAGNOSIS — O4290 Premature rupture of membranes, unspecified as to length of time between rupture and onset of labor, unspecified weeks of gestation: Secondary | ICD-10-CM | POA: Diagnosis not present

## 2018-08-13 DIAGNOSIS — O9952 Diseases of the respiratory system complicating childbirth: Secondary | ICD-10-CM | POA: Diagnosis present

## 2018-08-13 DIAGNOSIS — J4520 Mild intermittent asthma, uncomplicated: Secondary | ICD-10-CM | POA: Diagnosis present

## 2018-08-13 DIAGNOSIS — M25531 Pain in right wrist: Secondary | ICD-10-CM | POA: Diagnosis not present

## 2018-08-13 DIAGNOSIS — Z3490 Encounter for supervision of normal pregnancy, unspecified, unspecified trimester: Secondary | ICD-10-CM

## 2018-08-13 DIAGNOSIS — S6981XA Other specified injuries of right wrist, hand and finger(s), initial encounter: Secondary | ICD-10-CM | POA: Diagnosis not present

## 2018-08-13 DIAGNOSIS — R52 Pain, unspecified: Secondary | ICD-10-CM

## 2018-08-13 DIAGNOSIS — O42919 Preterm premature rupture of membranes, unspecified as to length of time between rupture and onset of labor, unspecified trimester: Secondary | ICD-10-CM | POA: Diagnosis present

## 2018-08-13 DIAGNOSIS — O98313 Other infections with a predominantly sexual mode of transmission complicating pregnancy, third trimester: Secondary | ICD-10-CM | POA: Diagnosis present

## 2018-08-13 DIAGNOSIS — A6000 Herpesviral infection of urogenital system, unspecified: Secondary | ICD-10-CM | POA: Diagnosis present

## 2018-08-13 DIAGNOSIS — O42913 Preterm premature rupture of membranes, unspecified as to length of time between rupture and onset of labor, third trimester: Principal | ICD-10-CM | POA: Diagnosis present

## 2018-08-13 LAB — URINALYSIS, ROUTINE W REFLEX MICROSCOPIC
Bilirubin Urine: NEGATIVE
Glucose, UA: NEGATIVE mg/dL
Hgb urine dipstick: NEGATIVE
KETONES UR: NEGATIVE mg/dL
LEUKOCYTES UA: NEGATIVE
Nitrite: NEGATIVE
PH: 8 (ref 5.0–8.0)
Protein, ur: 30 mg/dL — AB
Specific Gravity, Urine: 1.024 (ref 1.005–1.030)

## 2018-08-13 LAB — TYPE AND SCREEN
ABO/RH(D): A POS
Antibody Screen: NEGATIVE

## 2018-08-13 LAB — AMNISURE RUPTURE OF MEMBRANE (ROM) NOT AT ARMC: AMNISURE: NEGATIVE

## 2018-08-13 MED ORDER — BETAMETHASONE SOD PHOS & ACET 6 (3-3) MG/ML IJ SUSP
12.0000 mg | Freq: Once | INTRAMUSCULAR | Status: AC
  Administered 2018-08-13: 12 mg via INTRAMUSCULAR
  Filled 2018-08-13: qty 2

## 2018-08-13 MED ORDER — ACETAMINOPHEN 325 MG PO TABS: 650.0000 mg | ORAL_TABLET | ORAL | Status: DC | PRN

## 2018-08-13 MED ORDER — ZOLPIDEM TARTRATE 5 MG PO TABS
5.0000 mg | ORAL_TABLET | Freq: Every evening | ORAL | Status: DC | PRN
  Administered 2018-08-14: 5 mg via ORAL
  Filled 2018-08-13: qty 1

## 2018-08-13 MED ORDER — DOCUSATE SODIUM 100 MG PO CAPS: 100.0000 mg | ORAL_CAPSULE | Freq: Every day | ORAL | Status: DC

## 2018-08-13 MED ORDER — SODIUM CHLORIDE 0.9 % IV SOLN
2.0000 g | Freq: Four times a day (QID) | INTRAVENOUS | Status: AC
  Administered 2018-08-13 – 2018-08-15 (×8): 2 g via INTRAVENOUS
  Filled 2018-08-13: qty 2000
  Filled 2018-08-13 (×3): qty 2
  Filled 2018-08-13: qty 2000
  Filled 2018-08-13 (×3): qty 2

## 2018-08-13 MED ORDER — ONDANSETRON 4 MG PO TBDP
4.0000 mg | ORAL_TABLET | Freq: Four times a day (QID) | ORAL | Status: DC | PRN
  Filled 2018-08-13: qty 1

## 2018-08-13 MED ORDER — AZITHROMYCIN 250 MG PO TABS: 500.0000 mg | ORAL_TABLET | Freq: Every day | ORAL | Status: DC

## 2018-08-13 MED ORDER — CALCIUM CARBONATE ANTACID 500 MG PO CHEW: 2.0000 | CHEWABLE_TABLET | ORAL | Status: DC | PRN

## 2018-08-13 MED ORDER — LACTATED RINGERS IV SOLN
INTRAVENOUS | Status: DC
  Administered 2018-08-14: 125 mL/h via INTRAVENOUS

## 2018-08-13 MED ORDER — AMOXICILLIN 500 MG PO CAPS
500.0000 mg | ORAL_CAPSULE | Freq: Three times a day (TID) | ORAL | Status: DC
  Administered 2018-08-15 – 2018-08-16 (×2): 500 mg via ORAL
  Filled 2018-08-13 (×3): qty 1

## 2018-08-13 MED ORDER — PRENATAL MULTIVITAMIN CH
1.0000 | ORAL_TABLET | Freq: Every day | ORAL | Status: DC
  Administered 2018-08-14: 1 via ORAL
  Filled 2018-08-13 (×3): qty 1

## 2018-08-13 MED ORDER — ONDANSETRON HCL 4 MG/2ML IJ SOLN
4.0000 mg | Freq: Four times a day (QID) | INTRAMUSCULAR | Status: DC | PRN
  Administered 2018-08-13: 4 mg via INTRAVENOUS
  Filled 2018-08-13: qty 2

## 2018-08-13 MED ORDER — SODIUM CHLORIDE 0.9 % IV SOLN
500.0000 mg | INTRAVENOUS | Status: DC
  Administered 2018-08-13: 500 mg via INTRAVENOUS
  Filled 2018-08-13 (×2): qty 500

## 2018-08-13 NOTE — MAU Note (Signed)
Started leaking about 42min ago, still coming..  Lost plug last wk.  Started cramping after leaking started.

## 2018-08-13 NOTE — H&P (Addendum)
History     CSN: [id number]  Arrival date and time: 08/13/18 1609   First Provider Initiated Contact with Patient 08/13/18 1648     Chief Complaint  Patient presents with  . Rupture of Membranes   HPI Angela Branch is a 22 y.o. G3P1011 at [redacted]w[redacted]d who presents with possible rupture of membranes. She states she has had gushes of fluid that have soaked her underwear and leggings. She states the leaking has continued since her arrival in MAU. She also reports intermittent cramping. She denies pain or vaginal bleeding.   OB History    Gravida  3   Para  1   Term  1   Preterm  0   AB  1   Living  1     SAB  1   TAB  0   Ectopic  0   Multiple  0   Live Births  1           Past Medical History:  Diagnosis Date  . Anemia   . Anxiety   . Asthma   . Thyroid disease     Past Surgical History:  Procedure Laterality Date  . TOOTH EXTRACTION      Family History  Problem Relation Age of Onset  . Asthma Father   . Diabetes Maternal Grandmother   . Hyperlipidemia Paternal Grandfather   . Heart disease Paternal Grandfather   . Cancer - Colon Maternal Grandfather   . Mental illness Neg Hx   . Birth defects Neg Hx   . Kidney disease Neg Hx   . Hypertension Neg Hx     Social History   Tobacco Use  . Smoking status: Never Smoker  . Smokeless tobacco: Never Used  Substance Use Topics  . Alcohol use: No    Alcohol/week: 0.0 standard drinks  . Drug use: No    Allergies: No Known Allergies  Medications Prior to Admission  Medication Sig Dispense Refill Last Dose  . albuterol (PROVENTIL HFA;VENTOLIN HFA) 108 (90 Base) MCG/ACT inhaler Inhale 2 puffs into the lungs every 6 (six) hours as needed for wheezing or shortness of breath.   Past Month at Unknown time  . metroNIDAZOLE (METROGEL) 0.75 % gel Insert one (1) applicator at bedtime for 5 nights 45 g 0 08/12/2018 at Unknown time  . clindamycin (CLEOCIN) 2 % vaginal cream Place 1 Applicatorful vaginally at  bedtime. 40 g 0   . docusate sodium (COLACE) 100 MG capsule Take 1 capsule (100 mg total) by mouth 2 (two) times daily. (Patient not taking: Reported on 07/21/2018) 60 capsule 1 Not Taking  . Elastic Bandages & Supports (COMFORT FIT MATERNITY SUPP SM) MISC Maternity support belt - fit to client 1 each 0     Review of Systems  Constitutional: Negative.  Negative for fatigue and fever.  HENT: Negative.   Respiratory: Negative.  Negative for shortness of breath.   Cardiovascular: Negative.  Negative for chest pain.  Gastrointestinal: Negative.  Negative for abdominal pain, constipation, diarrhea, nausea and vomiting.  Genitourinary: Positive for vaginal discharge. Negative for dysuria.  Neurological: Negative.  Negative for dizziness and headaches.   Physical Exam   Blood pressure 113/70, pulse 92, temperature 98.6 F (37 C), temperature source Oral, resp. rate 16, height 5' 2.5" (1.588 m), weight 66.2 kg, last menstrual period 12/31/2017, SpO2 100 %.  Physical Exam  Nursing note and vitals reviewed. Constitutional: She is oriented to person, place, and time. She appears well-developed and well-nourished.  No distress.  HENT:  Head: Normocephalic.  Eyes: Pupils are equal, round, and reactive to light.  Cardiovascular: Normal rate, regular rhythm and normal heart sounds.  Respiratory: Effort normal and breath sounds normal. No respiratory distress.  GI: Soft. Bowel sounds are normal. She exhibits no distension. There is no tenderness.  Neurological: She is alert and oriented to person, place, and time.  Skin: Skin is warm and dry.  Psychiatric: She has a normal mood and affect. Her behavior is normal. Judgment and thought content normal.   Pelvic: copious clear fluid pooling in vagina and speculum, residual metrogel adherent to vaginal walls.  Fetal Tracing:  Baseline: 140 Variability: moderate Accels: 15x15 Decels: none  Toco: occasional uc's   MAU Course  Procedures Results  for orders placed or performed during the hospital encounter of 08/13/18 (from the past 24 hour(s))  Urinalysis, Routine w reflex microscopic     Status: Abnormal   Collection Time: 08/13/18  4:36 PM  Result Value Ref Range   Color, Urine YELLOW YELLOW   APPearance CLEAR CLEAR   Specific Gravity, Urine 1.024 1.005 - 1.030   pH 8.0 5.0 - 8.0   Glucose, UA NEGATIVE NEGATIVE mg/dL   Hgb urine dipstick NEGATIVE NEGATIVE   Bilirubin Urine NEGATIVE NEGATIVE   Ketones, ur NEGATIVE NEGATIVE mg/dL   Protein, ur 30 (A) NEGATIVE mg/dL   Nitrite NEGATIVE NEGATIVE   Leukocytes, UA NEGATIVE NEGATIVE   RBC / HPF 0-5 0 - 5 RBC/hpf   WBC, UA 0-5 0 - 5 WBC/hpf   Bacteria, UA RARE (A) NONE SEEN   Squamous Epithelial / LPF 0-5 0 - 5   Mucus PRESENT   Amnisure rupture of membrane (rom)not at [facility]     Status: None   Collection Time: 08/13/18  5:02 PM  Result Value Ref Range   Amnisure ROM NEGATIVE      MDM UA Amnisure [name] MFM OB Limited- AFI 13cm  Consulted with Dr. Rip Harbour regarding exam and results- MD to bedside to reexamine patient. Will treat as PPROM and admit patient to [facility] for observation.   Assessment and Plan   1. Preterm premature rupture of membranes (PPROM) with unknown onset of labor    -Admit to OB High Risk -BMZ  -Antibiotics per PPROM protocol -EFM, NST every shift -Will consider repeat AFI in am for evaluation of possible PPROM  Wende Mott CNM 08/13/2018, 4:48 PM    OB Attending Pt seen and examined. As noted above. My SSE did not revealing and pooling. However in light of previous exam, will consider pt PROM. Begin antibiotic therapy and celestone. Further evaluation tomorrow to confirm rupture or not. POC reviewed with pt. She verbalized understanding and agrees.   Arlina Robes, MD

## 2018-08-14 ENCOUNTER — Encounter: Payer: Self-pay | Admitting: Obstetrics and Gynecology

## 2018-08-14 ENCOUNTER — Observation Stay (HOSPITAL_BASED_OUTPATIENT_CLINIC_OR_DEPARTMENT_OTHER): Payer: Medicaid Other

## 2018-08-14 DIAGNOSIS — Z3A32 32 weeks gestation of pregnancy: Secondary | ICD-10-CM | POA: Diagnosis not present

## 2018-08-14 DIAGNOSIS — O9952 Diseases of the respiratory system complicating childbirth: Secondary | ICD-10-CM | POA: Diagnosis present

## 2018-08-14 DIAGNOSIS — A6000 Herpesviral infection of urogenital system, unspecified: Secondary | ICD-10-CM | POA: Diagnosis present

## 2018-08-14 DIAGNOSIS — Z362 Encounter for other antenatal screening follow-up: Secondary | ICD-10-CM

## 2018-08-14 DIAGNOSIS — O42913 Preterm premature rupture of membranes, unspecified as to length of time between rupture and onset of labor, third trimester: Secondary | ICD-10-CM | POA: Diagnosis not present

## 2018-08-14 DIAGNOSIS — Y92230 Patient room in hospital as the place of occurrence of the external cause: Secondary | ICD-10-CM | POA: Diagnosis not present

## 2018-08-14 DIAGNOSIS — J4520 Mild intermittent asthma, uncomplicated: Secondary | ICD-10-CM | POA: Diagnosis present

## 2018-08-14 DIAGNOSIS — O26893 Other specified pregnancy related conditions, third trimester: Secondary | ICD-10-CM | POA: Diagnosis not present

## 2018-08-14 DIAGNOSIS — O98313 Other infections with a predominantly sexual mode of transmission complicating pregnancy, third trimester: Secondary | ICD-10-CM | POA: Diagnosis present

## 2018-08-14 DIAGNOSIS — O4290 Premature rupture of membranes, unspecified as to length of time between rupture and onset of labor, unspecified weeks of gestation: Secondary | ICD-10-CM | POA: Diagnosis not present

## 2018-08-14 DIAGNOSIS — M25531 Pain in right wrist: Secondary | ICD-10-CM | POA: Diagnosis not present

## 2018-08-14 DIAGNOSIS — O42919 Preterm premature rupture of membranes, unspecified as to length of time between rupture and onset of labor, unspecified trimester: Secondary | ICD-10-CM | POA: Diagnosis not present

## 2018-08-14 DIAGNOSIS — S6981XA Other specified injuries of right wrist, hand and finger(s), initial encounter: Secondary | ICD-10-CM | POA: Diagnosis not present

## 2018-08-14 DIAGNOSIS — Z5321 Procedure and treatment not carried out due to patient leaving prior to being seen by health care provider: Secondary | ICD-10-CM | POA: Diagnosis present

## 2018-08-14 LAB — RAPID URINE DRUG SCREEN, HOSP PERFORMED
AMPHETAMINES: NOT DETECTED
Barbiturates: NOT DETECTED
Benzodiazepines: NOT DETECTED
Cocaine: NOT DETECTED
Opiates: NOT DETECTED
TETRAHYDROCANNABINOL: POSITIVE — AB

## 2018-08-14 LAB — CBC
HCT: 31.3 % — ABNORMAL LOW (ref 36.0–46.0)
Hemoglobin: 10.6 g/dL — ABNORMAL LOW (ref 12.0–15.0)
MCH: 30.3 pg (ref 26.0–34.0)
MCHC: 33.9 g/dL (ref 30.0–36.0)
MCV: 89.4 fL (ref 78.0–100.0)
PLATELETS: 167 10*3/uL (ref 150–400)
RBC: 3.5 MIL/uL — AB (ref 3.87–5.11)
RDW: 13.1 % (ref 11.5–15.5)
WBC: 14.9 10*3/uL — ABNORMAL HIGH (ref 4.0–10.5)

## 2018-08-14 LAB — AMNISURE RUPTURE OF MEMBRANE (ROM) NOT AT ARMC: Amnisure ROM: NEGATIVE

## 2018-08-14 LAB — TSH: TSH: 0.227 u[IU]/mL — AB (ref 0.350–4.500)

## 2018-08-14 MED ORDER — ZOLPIDEM TARTRATE 5 MG PO TABS
5.0000 mg | ORAL_TABLET | Freq: Every evening | ORAL | Status: DC | PRN
  Administered 2018-08-14 – 2018-08-16 (×3): 5 mg via ORAL
  Filled 2018-08-14 (×3): qty 1

## 2018-08-14 MED ORDER — VALACYCLOVIR HCL 500 MG PO TABS
500.0000 mg | ORAL_TABLET | Freq: Two times a day (BID) | ORAL | Status: DC
  Administered 2018-08-14 – 2018-08-17 (×7): 500 mg via ORAL
  Filled 2018-08-14 (×9): qty 1

## 2018-08-14 MED ORDER — BETAMETHASONE SOD PHOS & ACET 6 (3-3) MG/ML IJ SUSP
12.0000 mg | Freq: Once | INTRAMUSCULAR | Status: AC
  Administered 2018-08-14: 12 mg via INTRAMUSCULAR
  Filled 2018-08-14: qty 2

## 2018-08-14 MED ORDER — DOCUSATE SODIUM 100 MG PO CAPS: 100.0000 mg | ORAL_CAPSULE | Freq: Two times a day (BID) | ORAL | Status: DC | PRN

## 2018-08-14 MED ORDER — FLUCONAZOLE 150 MG PO TABS
150.0000 mg | ORAL_TABLET | ORAL | Status: DC
  Administered 2018-08-14: 150 mg via ORAL
  Filled 2018-08-14 (×2): qty 1

## 2018-08-14 MED ORDER — LORAZEPAM 2 MG/ML IJ SOLN
1.0000 mg | Freq: Once | INTRAMUSCULAR | Status: AC
  Administered 2018-08-14: 1 mg via INTRAVENOUS
  Filled 2018-08-14: qty 0.5

## 2018-08-14 NOTE — Progress Notes (Signed)
Pt refusing her lab draw, MD rescheduled to in the morning.

## 2018-08-14 NOTE — Progress Notes (Signed)
Pt to MFM

## 2018-08-14 NOTE — Progress Notes (Addendum)
AP Note AFVS normal and stable  Patient thinks she's having more LOF (clear) come out. Pad looks dry  SSE: +white cottage cheese d/c in vault. There is about 90mL of water-clear fluid in the vagina. Negative cough test. No lesions noted. Visually closed  Fern negative Amnisure obtained and sent per manufacturer instructions.   Prelim u/s with normal growth and AFI 88.7, cephalic.   D/w patient on rounds with MFM. F/u amnisure results.   Durene Romans MD Attending Center for [employer] (Faculty Practice) 08/14/2018 Time: 1055am   amnisure negative. D/w with MFM Dr. Donalee Citrin and will treat as PPROM for now, reassess with bpp Monday afternoon and d/w her re: potential dye test based on those results. Pt doesn't like azithromycin b/c of SE but is okay with the amox and doing valtrex ppx. She did get some last night and rationale for it d/w with pt but she declines. Pt amenable to plan  Durene Romans MD Attending [facility] for [place] (Faculty Practice) 08/14/2018 Time: [id number]

## 2018-08-15 ENCOUNTER — Encounter (HOSPITAL_COMMUNITY): Payer: Self-pay | Admitting: Certified Registered Nurse Anesthetist

## 2018-08-15 MED ORDER — NIFEDIPINE 10 MG PO CAPS
10.0000 mg | ORAL_CAPSULE | Freq: Four times a day (QID) | ORAL | Status: DC | PRN
  Administered 2018-08-15 (×2): 10 mg via ORAL
  Filled 2018-08-15 (×2): qty 1

## 2018-08-15 MED ORDER — TERBUTALINE SULFATE 1 MG/ML IJ SOLN
0.2500 mg | Freq: Once | INTRAMUSCULAR | Status: DC
  Filled 2018-08-15: qty 1

## 2018-08-15 MED ORDER — FENTANYL CITRATE (PF) 100 MCG/2ML IJ SOLN
25.0000 ug | Freq: Once | INTRAMUSCULAR | Status: AC
  Administered 2018-08-15: 25 ug via INTRAVENOUS
  Filled 2018-08-15: qty 2

## 2018-08-15 MED ORDER — SODIUM CHLORIDE 0.9 % IV SOLN
500.0000 mg | Freq: Three times a day (TID) | INTRAVENOUS | Status: DC
  Administered 2018-08-15 – 2018-08-16 (×3): 500 mg via INTRAVENOUS
  Filled 2018-08-15 (×4): qty 10

## 2018-08-15 MED ORDER — LACTATED RINGERS IV BOLUS
1000.0000 mL | Freq: Once | INTRAVENOUS | Status: AC
  Administered 2018-08-15: 1000 mL via INTRAVENOUS

## 2018-08-15 NOTE — Progress Notes (Signed)
Patient demanded to taken off fetal monitoring.  Was explained to her that she needed to be monitored for this shift and needed another 15 min more.  Patient refused strongly, pain med given and reassured.

## 2018-08-15 NOTE — Progress Notes (Signed)
Pt called out for RN, stating she had leaked some fluid.  I went in to assess the pt and noted a scant amount of clear fluid on the peripad. Placed pt on monitor and notified Dr. Ilda Basset. No new orders received at this time. Will continue to monitor.

## 2018-08-15 NOTE — Progress Notes (Signed)
OB note Patient feeling some lower back discomfort so put on the monitor  Patient feels maybe q44m UCs if that 135 baseline, no accels, or decels, mod var q1-45m  NAD 0.5/long/high. No fluid or gush of fluid noted on exam. No VB Was 0.5cm on 8/13  A/P: pt stable, BH UCs Pt amenable to a dose of procardia. Pt told to let us know if UCs feel more uncomfortable Leave on EFM 1h s/p procardia and after EFM becomes reactive.  Durene Romans MD Attending [facility] [employer] (Faculty Practice) 08/15/2018 Time: 1105am

## 2018-08-15 NOTE — Progress Notes (Signed)
OB Note Long d/w pt re: plan of care. Pt stating that she doesn't have normal labor and that she doesn't dilate b/c she didn't dilate with her son when she was term and had to have her water broken. She states that she doesn't understand why we are trying to stop her labor if "the baby wants to come." I told her that we aren't trying to stop her labor b/c now that she's gotten two doses of steroids that we wouldn't tocolyze her labor but I told her that contractions w/o cervical change is braxton hicks contractions and is very common especially at this point in the 3rd trimester. I told her that once we've ruled out that she's not in labor and is having BH contractions, which we have with multiple unchanged cervical exams, then we can treat her contractions and make her more comfortable since the fetus looks fine and she has no indication for a preterm delivery. She still was wondering why we wouldn't help the baby come if "the baby is telling her that it's ready." She said that the contractions are a sign that the baby is ready and she reiterated that she doesn't have normal labor and doesn't dilate. I told her that contractions w/o cx change, reassuring maternal status, and a fetus that is category I with accels and irregular UCs doesn't indicate that it's time for delivery; I also told her that, in this case being preterm, that if she doesn't dilate that that is a good thing.  I told her that we can offer her medications to make her feel more comfortable and she is amenable to this. She'd like to try a different medication to help with the contractions and pain medication. Will write for a dose of terbutaline and fentanyl.   Durene Romans MD Attending [facility] for [employer] (Faculty Practice) 08/15/2018 Time: [phone]

## 2018-08-15 NOTE — Progress Notes (Signed)
Patient ID: Angela Branch, female   DOB: 1996-02-01, 22 y.o.   MRN: [id number] [place]) NOTE  Angela Branch is a 22 y.o. G3P1011 with Estimated Date of Delivery: 10/07/18    [redacted]w[redacted]d  who is admitted for PROM.    Fetal presentation is cephalic. Length of Stay:  1  Days  Date of admission:08/13/2018  Subjective: No fluid since yesterday afternoon Patient reports the fetal movement as active. Patient reports uterine contraction  activity as none. Patient reports  vaginal bleeding as none. Patient describes fluid per vagina as None.  Pt had concern over FHR strip, states it was low at points. I reviewed the strip and baby had great accels, very active, and it appears difficult to trace for a few seconds at a time and the tracing was trying to fine the baby which represented what the pt was worried about.  I reassured her it was just due to the active baby and the Doppler searching for the moving baby's heart, not the actual heart rate itself  Vitals:  Blood pressure (!) 115/56, pulse 99, temperature 98.7 F (37.1 C), temperature source Oral, resp. rate 18, height 5' 2.5" (1.588 m), weight 66.2 kg, last menstrual period 12/31/2017, SpO2 100 %. Vitals:   08/14/18 2215 08/15/18 0029 08/15/18 0303 08/15/18 0737  BP:  (!) 88/56 (!) 89/39 (!) 115/56  Pulse: (!) 120 (!) 130 (!) 119 99  Resp:  $Remo'16 18 18  'wiKdM$ Temp:  98.6 F (37 C) 98.2 F (36.8 C) 98.7 F (37.1 C)  TempSrc:  Oral Oral Oral  SpO2: 100% 98% 100%   Weight:      Height:       Physical Examination:  General appearance - alert, well appearing, and in no distress Abdomen - soft, nontender, nondistended, no masses or organomegaly Fundal Height:  size equals dates Pelvic Exam:  deferred Cervical Exam: Extremities: extremities normal, atraumatic, no cyanosis or edema with DTRs  Membranes:presumed ruptured although not definitive  Fetal Monitoring:     reactive  Labs:  Results for orders placed or  performed during the hospital encounter of 08/13/18 (from the past 24 hour(s))  Urine rapid drug screen (hosp performed)   Collection Time: 08/14/18  8:30 AM  Result Value Ref Range   Opiates NONE DETECTED NONE DETECTED   Cocaine NONE DETECTED NONE DETECTED   Benzodiazepines NONE DETECTED NONE DETECTED   Amphetamines NONE DETECTED NONE DETECTED   Tetrahydrocannabinol POSITIVE (A) NONE DETECTED   Barbiturates NONE DETECTED NONE DETECTED  Amnisure rupture of membrane (rom)not at [facility]   Collection Time: 08/14/18 10:55 AM  Result Value Ref Range   Amnisure ROM NEGATIVE   CBC   Collection Time: 08/14/18  1:12 PM  Result Value Ref Range   WBC 14.9 (H) 4.0 - 10.5 K/uL   RBC 3.50 (L) 3.87 - 5.11 MIL/uL   Hemoglobin 10.6 (L) 12.0 - 15.0 g/dL   HCT 31.3 (L) 36.0 - 46.0 %   MCV 89.4 78.0 - 100.0 fL   MCH 30.3 26.0 - 34.0 pg   MCHC 33.9 30.0 - 36.0 g/dL   RDW 13.1 11.5 - 15.5 %   Platelets 167 150 - 400 K/uL  TSH   Collection Time: 08/14/18  1:12 PM  Result Value Ref Range   TSH 0.227 (L) 0.350 - 4.500 uIU/mL    Imaging Studies:      Medications:  Scheduled . amoxicillin  500 mg Oral Q8H  . azithromycin  500 mg Oral  Daily  . fluconazole  150 mg Oral Q72H  . prenatal multivitamin  1 tablet Oral Q1200  . valACYclovir  500 mg Oral BID   I have reviewed the patient's current medications.  ASSESSMENT: [id number] [redacted]w[redacted]d Estimated Date of Delivery: 10/07/18  Patient Active Problem List   Diagnosis Date Noted  . Preterm premature rupture of membranes (PPROM) with unknown onset of labor 08/13/2018  . 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 Supervision of other normal pregnancy, antepartum 03/12/2018  . Thyroid disease 03/12/2018  . Bacterial vaginitis 02/03/2018  . Adjustment disorder with mixed anxiety and depressed mood 04/29/2016  . GERD (gastroesophageal reflux disease) 09/21/2015  . Eczema 10/04/2014  . Asthma 06/12/2012    PLAN: >Continue in house observation with  latency antibiotics >she has a sensitivity to zithromax, will try erythromycin as an alternative >unclear as to whether patient is actuallyu ruptured, all amniosure tests have been negative as have ferning, however pooling has been observed by 3 providers >consider dye test Monday if not clearly ruptured prior to that  [place] 08/15/2018,7:49 AM

## 2018-08-15 NOTE — Progress Notes (Signed)
OB note S/s feel stable s/p 1L IVF bolus and dose of procardia 42m  Category I with accels UCs: appear irregular, +irritability  NAD, patient on the phone, appears comfortable.  Unchanged.   A/P: pt stable Can come off continuous EFM, watch s/s. Continue with PRN procardia.   Durene Romans MD Attending Center for [employer] (Faculty Practice) 08/15/2018 Time: 1501pm

## 2018-08-16 LAB — TYPE AND SCREEN
ABO/RH(D): A POS
Antibody Screen: NEGATIVE

## 2018-08-16 MED ORDER — ERYTHROMYCIN ETHYLSUCCINATE 200 MG/5ML PO SUSR
250.0000 mg | Freq: Three times a day (TID) | ORAL | Status: DC
  Administered 2018-08-16 – 2018-08-17 (×4): 250 mg via ORAL
  Filled 2018-08-16 (×7): qty 6.3

## 2018-08-16 MED ORDER — AMOXICILLIN 250 MG/5ML PO SUSR
500.0000 mg | Freq: Three times a day (TID) | ORAL | Status: DC
  Administered 2018-08-16 – 2018-08-17 (×4): 500 mg via ORAL
  Filled 2018-08-16 (×7): qty 10

## 2018-08-16 MED ORDER — ERYTHROMYCIN BASE 250 MG PO TABS
250.0000 mg | ORAL_TABLET | Freq: Three times a day (TID) | ORAL | Status: DC
  Filled 2018-08-16 (×4): qty 1

## 2018-08-16 MED ORDER — FENTANYL CITRATE (PF) 100 MCG/2ML IJ SOLN
25.0000 ug | Freq: Once | INTRAMUSCULAR | Status: AC
  Administered 2018-08-16: 25 ug via INTRAMUSCULAR
  Filled 2018-08-16: qty 2

## 2018-08-16 NOTE — Progress Notes (Signed)
Patient ID: Angela Branch, female   DOB: 27-Sep-1996, 22 y.o.   MRN: [id number] [place]) NOTE  Angela Branch is a 22 y.o. G3P1011 with Estimated Date of Delivery: 10/07/18    [redacted]w[redacted]d  who is admitted for PROM.    Fetal presentation is cephalic. Length of Stay:  2  Days  Date of admission:08/13/2018  Subjective: Pt has been complaining of signficant abdominal and back pain since yesterday afternoon, Dr Ilda Basset checked her twice and there is no appreciable change, FT/th Patient reports the fetal movement as active. Patient reports uterine contraction  activity as irregular, every 10-15 minutes. Patient reports  vaginal bleeding as none. Patient describes fluid per vagina as None.  Vitals:  Blood pressure (!) 92/43, pulse 79, temperature 98.7 F (37.1 C), temperature source Oral, resp. rate 18, height 5' 2.5" (1.588 m), weight 66.2 kg, last menstrual period 12/31/2017, SpO2 99 %. Vitals:   08/15/18 1943 08/15/18 2320 08/16/18 0623 08/16/18 0817  BP: (!) 84/45 (!) 93/57 (!) 96/53 (!) 92/43  Pulse: 95 84 73 79  Resp: $Remo'16 16 16 18  'BxMbA$ Temp: 98.9 F (37.2 C) 98.8 F (37.1 C) 97.6 F (36.4 C) 98.7 F (37.1 C)  TempSrc: Oral Oral Oral Oral  SpO2: 100% 99% 100% 99%  Weight:      Height:       Physical Examination:  General appearance - alert, well appearing, and in no distress Abdomen - soft, nontender, nondistended, no masses or organomegaly Fundal Height:  size equals dates Pelvic Exam:  examination not indicated Cervical Exam: Not evaluated.  Membranes:unsure status  Fetal Monitoring:  Reactive NST     Labs:  No results found for this or any previous visit (from the past 24 hour(s)).  Imaging Studies:      Medications:  Scheduled . amoxicillin  500 mg Oral Q8H  . fluconazole  150 mg Oral Q72H  . prenatal multivitamin  1 tablet Oral Q1200  . terbutaline  0.25 mg Subcutaneous Once  . valACYclovir  500 mg Oral BID   I have reviewed the  patient's current medications.  ASSESSMENT: [id number] [redacted]w[redacted]d Estimated Date of Delivery: 10/07/18  Patient Active Problem List   Diagnosis Date Noted  . Preterm premature rupture of membranes (PPROM) with unknown onset of labor 08/13/2018  . 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 Supervision of other normal pregnancy, antepartum 03/12/2018  . Thyroid disease 03/12/2018  . Bacterial vaginitis 02/03/2018  . Adjustment disorder with mixed anxiety and depressed mood 04/29/2016  . GERD (gastroesophageal reflux disease) 09/21/2015  . Eczema 10/04/2014  . Asthma 06/12/2012    PLAN: >continue current care plan: Latency antibiotics, in house observation  >MFM to consider intra amniotic dye test tomorrow  >pt seems frustrated and disgruntled with the uncertainty of her status at this point.  I spent about 15 minutes with her counseling her on the clinical situation and the fact that indeed we do not want the baby to be delivered at this gestation but we will not do anything further to stop labor until if and when the dye test shows she is intact All questions were answered  Mertie Clause Krissa Utke 08/16/2018,9:58 AM

## 2018-08-17 ENCOUNTER — Inpatient Hospital Stay (HOSPITAL_COMMUNITY): Payer: Medicaid Other

## 2018-08-17 ENCOUNTER — Inpatient Hospital Stay (HOSPITAL_BASED_OUTPATIENT_CLINIC_OR_DEPARTMENT_OTHER): Payer: Medicaid Other

## 2018-08-17 DIAGNOSIS — Z3A32 32 weeks gestation of pregnancy: Secondary | ICD-10-CM

## 2018-08-17 DIAGNOSIS — O42919 Preterm premature rupture of membranes, unspecified as to length of time between rupture and onset of labor, unspecified trimester: Secondary | ICD-10-CM

## 2018-08-17 DIAGNOSIS — O4290 Premature rupture of membranes, unspecified as to length of time between rupture and onset of labor, unspecified weeks of gestation: Secondary | ICD-10-CM

## 2018-08-17 LAB — AMNISURE RUPTURE OF MEMBRANE (ROM) NOT AT ARMC: Amnisure ROM: NEGATIVE
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
## 2018-08-17 NOTE — Plan of Care (Signed)
  Problem: Education: Goal: Knowledge of disease or condition will improve Outcome: Progressing   

## 2018-08-17 NOTE — Progress Notes (Signed)
Patient ID: Angela Branch, female   DOB: 05-Aug-1996, 22 y.o.   MRN: [id number] CTSP by nursing at pt's request d/t episode of leaking. Pt requesting exam.  SSE: NL EGBUS, perineum dry, scant white vaginal discharge, no pooling or leaking of fluid noted, amnisure collected.  Continue with current management pending test results.
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
## 2018-08-17 NOTE — Progress Notes (Signed)
Pt left AMA. Risks discussed with patient. Dr Rip Harbour notified.
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
## 2018-08-17 NOTE — Progress Notes (Signed)
Pt continues to refuse fetal monitoring and antibiotics until she knows if she is being discharged. Per pt the MFM MD discussed with her inpatient obs until repeat u/s to assess fluid since she refuses the dye test. Awaiting note from MFM MD.

## 2018-08-17 NOTE — Consult Note (Signed)
Maternal-Fetal Medicine  I had the pleasure of seeing Ms. Stansell today at [facility].  She was admitted 4 days ago with complaints of leakage of amniotic fluid.  On the first speculum examination by the provider copious clear fluid and pooling was seen in the vagina.  Subsequent examinations by the obstetricians did not confirm pooling of amniotic fluid.  Ferning and AmniSure were negative.  Patient is receiving antibiotics as per PPROM protocol and received a course of betamethasone. Patient does not have fever.  She has occasional mild abdominal cramping, but no vaginal bleeding. Her prenatal care has otherwise been uneventful.  On cell free fetal DNA screening the risks of aneuploidies are not increased.  She does not have gestational diabetes.  Past medical history: Hypothyroidism, but recent thyroid function tests (T4 an T3) have been normal.  TSH is low.  She does not have symptoms of hyperthyroidism.  She has mild intermittent asthma that is stable.  No history of hypertension. Past surgical history: Nil of note. Allergies: No known drug allergies. Social: Patient uses marijuana.  She she denies tobacco or alcohol use. Family history: No history of venous thromboembolism in the family.  Obstetric history significant for a term vaginal delivery in 2016 of a female infant weighing 7 pounds 2 ounces.  He is in good health.  She also had a midtrimester pregnancy loss at 14 weeks that followed persistent vaginal bleeding.  No history of cervical incompetence. GYN history: She denies history of abnormal Pap smears or cervical surgeries.  P/E: Patient is comfortably lying on the examination table; not in distress. Abdomen: Soft gravid uterus, no tenderness in any of the quadrants. Vitals stable.  Labs: WBC 14.9, hemoglobin 10.6, hematocrit 31.3,, platelets 167.  TSH 0.227.  On ultrasound, amniotic fluid is normal (AFI=14 cm) and good fetal activity seen.  Fetal breathing  movements were not seen over 30 minutes observation. BPP score (including reactive NST performed at high risk OB) is 8/10.  After explaining, I performed a sterile speculum examination.  Cervix and vagina look healthy thick normal-looking white vaginal discharge is seen.  No pooling of amniotic fluid is seen.  On digital examination, the cervix is long and closed and about 25% effaced.  No vaginal bleeding is noted.  I counseled the patient on the following: PPROM: I explained the significance of ultrasound and clinical findings and reassured her the likelihood of rupture of membranes is very low.  I informed her that this is contrary to the first examination.  It is likely she had high leakage of amniotic fluid and membrane may have resealed.  Patient, however, feels that she is having intermittent leakage of amniotic fluid.  She understands that there is no objective confirmation on subsequent examinations.  I informed her that only amniocentesis and dye test cannot rule out leakage of amniotic fluid.  I explained the procedure and the possible risk of preterm delivery (more likely to have preterm delivery from PPROM if present than from the procedure).   Patient does not want amniocentesis procedure.  She feels she should stay in the hospital because of her abdominal cramping and possible leakage of amniotic fluid.  We discussed the timing of delivery.  If the diagnosis of PPROM is conclusively proven, we would recommend delivery at 34 weeks.  If there is no objective evidence of PPROM and the amniotic fluid remains normal on ultrasound examination, delivery is not indicated at 34 weeks as it would lead to prematurity  of the newborn.  I recommend we complete the antibiotic course and observe for 24 to 48 hours.  If there is no further leakage of amniotic fluid discharge may be considered.  Recommendations: -Observation for 24 to 48 hours and if no further leakage is seen patient may be  discharged. -Follow-up evaluation of amniotic fluid and BPP on 08/20/2018 at the [facility]. -If patient reconsiders amniocentesis we will perform the procedure tomorrow.  Thank you for your consultation.  If you have any questions or concerns please do not hesitate to contact me at the [facility].  Consultation including face-to-face counseling 40 minutes.

## 2018-08-17 NOTE — Progress Notes (Signed)
Rn called to room, pt requesting the MD to come and see her now. Rn assessing the pt complaint of LOF, panties noted to have a wet area at the back of the panties, panty liner on and maybe slightly moist. No VB noted. Pt c/o abd and back pain that is constant. Pt allowing RN to place her on the [facility] for further evaluation. Explained to the patient that she had a MFM consult to help determine PPROM and POC. MD notified and in sign out report.

## 2018-08-20 ENCOUNTER — Ambulatory Visit (INDEPENDENT_AMBULATORY_CARE_PROVIDER_SITE_OTHER): Payer: Medicaid Other | Admitting: Student

## 2018-08-20 ENCOUNTER — Other Ambulatory Visit: Payer: Self-pay | Admitting: Obstetrics & Gynecology

## 2018-08-20 ENCOUNTER — Ambulatory Visit (HOSPITAL_COMMUNITY)
Admission: RE | Admit: 2018-08-20 | Discharge: 2018-08-20 | Disposition: A | Discharge status: Home / Self Care | Payer: Medicaid Other | Source: Ambulatory Visit | Attending: Obstetrics & Gynecology | Admitting: Obstetrics & Gynecology

## 2018-08-20 ENCOUNTER — Encounter (HOSPITAL_COMMUNITY): Payer: Self-pay

## 2018-08-20 VITALS — BP 103/64 | HR 77 | Wt 141.6 lb

## 2018-08-20 DIAGNOSIS — O99280 Endocrine, nutritional and metabolic diseases complicating pregnancy, unspecified trimester: Secondary | ICD-10-CM

## 2018-08-20 DIAGNOSIS — O99283 Endocrine, nutritional and metabolic diseases complicating pregnancy, third trimester: Secondary | ICD-10-CM | POA: Diagnosis not present

## 2018-08-20 DIAGNOSIS — E079 Disorder of thyroid, unspecified: Secondary | ICD-10-CM | POA: Diagnosis not present

## 2018-08-20 DIAGNOSIS — O99513 Diseases of the respiratory system complicating pregnancy, third trimester: Secondary | ICD-10-CM | POA: Diagnosis not present

## 2018-08-20 DIAGNOSIS — Z3A33 33 weeks gestation of pregnancy: Secondary | ICD-10-CM

## 2018-08-20 DIAGNOSIS — O4290 Premature rupture of membranes, unspecified as to length of time between rupture and onset of labor, unspecified weeks of gestation: Secondary | ICD-10-CM | POA: Diagnosis not present

## 2018-08-20 DIAGNOSIS — O26893 Other specified pregnancy related conditions, third trimester: Secondary | ICD-10-CM

## 2018-08-20 DIAGNOSIS — N898 Other specified noninflammatory disorders of vagina: Secondary | ICD-10-CM | POA: Insufficient documentation

## 2018-08-20 DIAGNOSIS — F1210 Cannabis abuse, uncomplicated: Secondary | ICD-10-CM | POA: Insufficient documentation

## 2018-08-20 DIAGNOSIS — Z3480 Encounter for supervision of other normal pregnancy, unspecified trimester: Secondary | ICD-10-CM

## 2018-08-20 DIAGNOSIS — Z3689 Encounter for other specified antenatal screening: Secondary | ICD-10-CM | POA: Insufficient documentation

## 2018-08-20 DIAGNOSIS — O99323 Drug use complicating pregnancy, third trimester: Secondary | ICD-10-CM | POA: Insufficient documentation

## 2018-08-20 DIAGNOSIS — J45909 Unspecified asthma, uncomplicated: Secondary | ICD-10-CM | POA: Diagnosis not present

## 2018-08-20 NOTE — Progress Notes (Addendum)
   PRENATAL VISIT NOTE  Subjective:  Angela Branch is a 22 y.o. G3P1011 at [redacted]w[redacted]d being seen today for ongoing prenatal care.  She is currently monitored for the following issues for this high-risk pregnancy and has Asthma; Eczema; GERD (gastroesophageal reflux disease); Adjustment disorder with mixed anxiety and depressed mood; Bacterial vaginitis; Supervision of other normal pregnancy, antepartum; Thyroid disease; Herpes simplex type 2 infection; and Preterm premature rupture of membranes (PPROM) with unknown onset of labor on their problem list.  Patient reports pain, decreased fetal movement, & continue leaking of fluid.  She was admitted last week for suspected PPROM. Had multiple negative amnisures and normal AFI. Pt signed out [place] on Monday; recommendation at that time was dye test vs continued observation & repeat ultrasound to assess AFI.  Today patient had a follow up BPP; results 8/8 with AFI of 13 -- her AFI on Monday was 14. She reports that she continues to leak clear fluid on a daily basis & is adamant that it is not urine or discharge. Also reports generalized pain since leaking started last Thursday. Pain in low back, lower abdomen, & bilateral feet. Has had decreased fetal movement since last Thursday. Reports feeling baby move 1-2 times per day. Voices her concern that no one is listening to her & taking her complaints seriously. "everyone is telling her the baby is ok but are not focusing on her".    The following portions of the patient's history were reviewed and updated as appropriate: allergies, current medications, past family history, past medical history, past social history, past surgical history and problem list. Problem list updated.  Objective:   Vitals:   08/20/18 1001  BP: 103/64  Pulse: 77  Weight: 141 lb 9.6 oz (64.2 kg)    Fetal Status: Fetal Heart Rate (bpm): 155   Movement: (!) Decreased     General:  Alert, oriented and cooperative. Patient is in no  acute distress.  Skin: Skin is warm and dry. No rash noted.   Cardiovascular: Normal heart rate noted  Respiratory: Normal respiratory effort, no problems with respiration noted  Abdomen: Soft, gravid, appropriate for gestational age.  Pain/Pressure: Present     Pelvic: Cervical exam deferred        Extremities: Normal range of motion.  Edema: Mild pitting, slight indentation  Mental Status: Normal mood and affect. Normal behavior. Normal judgment and thought content.   Assessment and Plan:  Pregnancy: G3P1011 at [redacted]w[redacted]d  1. Supervision of other normal pregnancy, antepartum   2. Vaginal discharge during pregnancy in third trimester -Discussed options for evaluation with patient. Recommend MAU visit for NST (had 8/8 BPP this morning), speculum exam, & amnisure.   Preterm labor symptoms and general obstetric precautions including but not limited to vaginal bleeding, contractions, leaking of fluid and fetal movement were reviewed in detail with the patient. Please refer to After Visit Summary for other counseling recommendations.  No follow-ups on file.  Future Appointments  Date Time Provider [place]  08/27/2018  3:45 PM [name] 2 [facility] M[facility]-US    Jorje Guild, NP

## 2018-08-21 ENCOUNTER — Encounter: Payer: Self-pay | Admitting: Obstetrics and Gynecology

## 2018-08-25 ENCOUNTER — Encounter: Payer: Self-pay | Admitting: Family Medicine

## 2018-08-26 ENCOUNTER — Other Ambulatory Visit (HOSPITAL_COMMUNITY): Payer: Self-pay | Admitting: *Deleted

## 2018-08-26 ENCOUNTER — Telehealth: Payer: Self-pay | Admitting: Family Medicine

## 2018-08-26 DIAGNOSIS — O368130 Decreased fetal movements, third trimester, not applicable or unspecified: Secondary | ICD-10-CM

## 2018-08-26 NOTE — Telephone Encounter (Signed)
Patient was adamant about getting an appointment this week.

## 2018-08-27 ENCOUNTER — Ambulatory Visit (HOSPITAL_COMMUNITY)
Admission: RE | Admit: 2018-08-27 | Discharge: 2018-08-27 | Disposition: A | Discharge status: Home / Self Care | Payer: Medicaid Other | Source: Ambulatory Visit | Attending: Obstetrics & Gynecology | Admitting: Obstetrics & Gynecology

## 2018-08-27 ENCOUNTER — Encounter (HOSPITAL_COMMUNITY): Payer: Self-pay

## 2018-08-27 DIAGNOSIS — O368130 Decreased fetal movements, third trimester, not applicable or unspecified: Secondary | ICD-10-CM | POA: Diagnosis not present

## 2018-08-27 DIAGNOSIS — J45909 Unspecified asthma, uncomplicated: Secondary | ICD-10-CM | POA: Diagnosis not present

## 2018-08-27 DIAGNOSIS — O42913 Preterm premature rupture of membranes, unspecified as to length of time between rupture and onset of labor, third trimester: Secondary | ICD-10-CM | POA: Diagnosis not present

## 2018-08-27 DIAGNOSIS — O4290 Premature rupture of membranes, unspecified as to length of time between rupture and onset of labor, unspecified weeks of gestation: Secondary | ICD-10-CM

## 2018-08-27 DIAGNOSIS — B9689 Other specified bacterial agents as the cause of diseases classified elsewhere: Secondary | ICD-10-CM

## 2018-08-27 DIAGNOSIS — O9989 Other specified diseases and conditions complicating pregnancy, childbirth and the puerperium: Secondary | ICD-10-CM | POA: Diagnosis not present

## 2018-08-27 DIAGNOSIS — E079 Disorder of thyroid, unspecified: Secondary | ICD-10-CM | POA: Diagnosis not present

## 2018-08-27 DIAGNOSIS — Z3480 Encounter for supervision of other normal pregnancy, unspecified trimester: Secondary | ICD-10-CM

## 2018-08-27 DIAGNOSIS — Z3A34 34 weeks gestation of pregnancy: Secondary | ICD-10-CM | POA: Diagnosis not present

## 2018-08-27 DIAGNOSIS — O99283 Endocrine, nutritional and metabolic diseases complicating pregnancy, third trimester: Secondary | ICD-10-CM | POA: Diagnosis not present

## 2018-08-27 DIAGNOSIS — N760 Acute vaginitis: Secondary | ICD-10-CM

## 2018-08-27 NOTE — ED Notes (Signed)
Pt here today in [place] for BPP.  Pt reports decreased fetal movement since last night but can't remember approximate time.  Pt states, "I just know I haven't felt the baby move today."  Pt reports she continues to feel pressure and pain.  When pt asked about timing the pain she states they are not regular and occur on and off during the day.  Pt denies vaginal bleeding and states she continues to leak clear fluid for about 2 weeks now.  Pt reports wearing sanitary pad and changing approx three times a day for cleanliness not due to saturation.

## 2018-08-28 ENCOUNTER — Encounter: Payer: Self-pay | Admitting: Obstetrics and Gynecology

## 2018-08-29 NOTE — Discharge Summary (Signed)
Patient ID: Angela Branch MRN: [id number] DOB/AGE: 01-17-1996 22 y.o.  Admit date: 08/13/2018 Discharge date: 08/29/2018  Admission Diagnoses: IUP 32 1/7 weeks ? PROM  Discharge Diagnoses: IUP 32 5/7 weeks undelivered, no clinical evidence of PROM  Prenatal Procedures: ultrasound  Consults: Neonatology, Maternal Fetal Medicine  Hospital Course:  This is a 22 y.o. G3P1011 with IUP at [redacted]w[redacted]d admitted for questionable PROM.Presenting history and intial exam in MAU were very suggestive of PROM. Pt was admitted with this presumed Dx. She was placed on antibiotics as per protocol. Received BMX x 2 for FLM. However further evaluation by exams and U/S failed to clearly demonstrate PROM. MFM consult was obtained. See their consult note for additional information. Pt declined their recommendations and on 08/17/18 pt left AMA.    Discharge Exam: NA   Significant Diagnostic Studies:  No results found for this or any previous visit (from the past 168 hour(s)).  Discharge Condition: Stable  Disposition:  There are no questions and answers to display.          Allergies as of 08/17/2018      Reactions   Azithromycin    Diarrhea, nausea/vomiting, IV burns vein      Medication List    ASK your doctor about these medications   albuterol 108 (90 Base) MCG/ACT inhaler Commonly known as:  PROVENTIL HFA;VENTOLIN HFA Inhale 2 puffs into the lungs every 6 (six) hours as needed for wheezing or shortness of breath.   clindamycin 2 % vaginal cream Commonly known as:  CLEOCIN Place 1 Applicatorful vaginally at bedtime.   COMFORT FIT MATERNITY SUPP SM Misc Maternity support belt - fit to client   docusate sodium 100 MG capsule Commonly known as:  COLACE Take 1 capsule (100 mg total) by mouth 2 (two) times daily.   metroNIDAZOLE 0.75 % gel Commonly known as:  METROGEL Insert one (1) applicator at bedtime for 5 nights        Signed: Chancy Milroy M.D. 08/29/2018, 9:44 PM

## 2018-08-30 ENCOUNTER — Encounter (HOSPITAL_COMMUNITY): Payer: Self-pay

## 2018-08-30 ENCOUNTER — Inpatient Hospital Stay (HOSPITAL_COMMUNITY)
Admission: AD | Admit: 2018-08-30 | Discharge: 2018-08-30 | Disposition: A | Discharge status: Home / Self Care | Payer: Medicaid Other | Source: Ambulatory Visit | Attending: Obstetrics and Gynecology | Admitting: Obstetrics and Gynecology

## 2018-08-30 ENCOUNTER — Other Ambulatory Visit: Payer: Self-pay

## 2018-08-30 DIAGNOSIS — O99513 Diseases of the respiratory system complicating pregnancy, third trimester: Secondary | ICD-10-CM | POA: Insufficient documentation

## 2018-08-30 DIAGNOSIS — J069 Acute upper respiratory infection, unspecified: Secondary | ICD-10-CM | POA: Insufficient documentation

## 2018-08-30 DIAGNOSIS — N898 Other specified noninflammatory disorders of vagina: Secondary | ICD-10-CM | POA: Insufficient documentation

## 2018-08-30 DIAGNOSIS — O4703 False labor before 37 completed weeks of gestation, third trimester: Secondary | ICD-10-CM

## 2018-08-30 DIAGNOSIS — Z3A34 34 weeks gestation of pregnancy: Secondary | ICD-10-CM | POA: Diagnosis not present

## 2018-08-30 DIAGNOSIS — Z0371 Encounter for suspected problem with amniotic cavity and membrane ruled out: Secondary | ICD-10-CM | POA: Diagnosis not present

## 2018-08-30 DIAGNOSIS — O479 False labor, unspecified: Secondary | ICD-10-CM

## 2018-08-30 LAB — URINALYSIS, ROUTINE W REFLEX MICROSCOPIC
BACTERIA UA: NONE SEEN
BILIRUBIN URINE: NEGATIVE
Glucose, UA: NEGATIVE mg/dL
HGB URINE DIPSTICK: NEGATIVE
Ketones, ur: NEGATIVE mg/dL
NITRITE: NEGATIVE
Protein, ur: NEGATIVE mg/dL
Specific Gravity, Urine: 1.011 (ref 1.005–1.030)
pH: 7 (ref 5.0–8.0)

## 2018-08-30 LAB — POCT FERN TEST: POCT FERN TEST: NEGATIVE

## 2018-08-30 MED ORDER — ZOLPIDEM TARTRATE 5 MG PO TABS: 5.0000 mg | ORAL_TABLET | Freq: Every evening | ORAL | 0 refills | Status: DC | PRN

## 2018-08-30 NOTE — MAU Note (Signed)
Started contracting around 0340, every 2-4 minutes. No bleeding, Having some leaking, states this has been ongoing since hospital admission  +FM

## 2018-08-30 NOTE — Discharge Instructions (Signed)
Braxton Hicks Contractions Contractions of the uterus can occur throughout pregnancy, but they are not always a sign that you are in labor. You may have practice contractions called Braxton Hicks contractions. These false labor contractions are sometimes confused with true labor. What are [place] contractions? Braxton Hicks contractions are tightening movements that occur in the muscles of the uterus before labor. Unlike true labor contractions, these contractions do not result in opening (dilation) and thinning of the cervix. Toward the end of pregnancy (32-34 weeks), Braxton Hicks contractions can happen more often and may become stronger. These contractions are sometimes difficult to tell apart from true labor because they can be very uncomfortable. You should not feel embarrassed if you go to the hospital with false labor. Sometimes, the only way to tell if you are in true labor is for your health care provider to look for changes in the cervix. The health care provider will do a physical exam and may monitor your contractions. If you are not in true labor, the exam should show that your cervix is not dilating and your water has not broken. If there are other health problems associated with your pregnancy, it is completely safe for you to be sent home with false labor. You may continue to have Braxton Hicks contractions until you go into true labor. How to tell the difference between true labor and false labor True labor  Contractions last 30-70 seconds.  Contractions become very regular.  Discomfort is usually felt in the top of the uterus, and it spreads to the lower abdomen and low back.  Contractions do not go away with walking.  Contractions usually become more intense and increase in frequency.  The cervix dilates and gets thinner. False labor  Contractions are usually shorter and not as strong as true labor contractions.  Contractions are usually irregular.  Contractions  are often felt in the front of the lower abdomen and in the groin.  Contractions may go away when you walk around or change positions while lying down.  Contractions get weaker and are shorter-lasting as time goes on.  The cervix usually does not dilate or become thin. Follow these instructions at home:  Take over-the-counter and prescription medicines only as told by your health care provider.  Keep up with your usual exercises and follow other instructions from your health care provider.  Eat and drink lightly if you think you are going into labor.  If Braxton Hicks contractions are making you uncomfortable: ? Change your position from lying down or resting to walking, or change from walking to resting. ? Sit and rest in a tub of warm water. ? Drink enough fluid to keep your urine pale yellow. Dehydration may cause these contractions. ? Do slow and deep breathing several times an hour.  Keep all follow-up prenatal visits as told by your health care provider. This is important. Contact a health care provider if:  You have a fever.  You have continuous pain in your abdomen. Get help right away if:  Your contractions become stronger, more regular, and closer together.  You have fluid leaking or gushing from your vagina.  You pass blood-tinged mucus (bloody show).  You have bleeding from your vagina.  You have low back pain that you never had before.  You feel your babys head pushing down and causing pelvic pressure.  Your baby is not moving inside you as much as it used to. Summary  Contractions that occur before labor are called Braxton  Hicks contractions, false labor, or practice contractions.  Braxton Hicks contractions are usually shorter, weaker, farther apart, and less regular than true labor contractions. True labor contractions usually become progressively stronger and regular and they become more frequent.  Manage discomfort from [facility] contractions by  changing position, resting in a warm bath, drinking plenty of water, or practicing deep breathing. This information is not intended to replace advice given to you by your health care provider. Make sure you discuss any questions you have with your health care provider. Document Released: 04/24/2017 Document Revised: 04/24/2017 Document Reviewed: 04/24/2017 Elsevier Interactive Patient Education  2018 [place] Medications in Pregnancy   Acne: Benzoyl Peroxide Salicylic Acid  Backache/Headache: Tylenol: 2 regular strength every 4 hours OR              2 Extra strength every 6 hours  Colds/Coughs/Allergies: Benadryl (alcohol free) 25 mg every 6 hours as needed Breath right strips Claritin Cepacol throat lozenges Chloraseptic throat spray Cold-Eeze- up to three times per day Cough drops, alcohol free Flonase (by prescription only) Guaifenesin Mucinex Robitussin DM (plain only, alcohol free) Saline nasal spray/drops Sudafed (pseudoephedrine) & Actifed ** use only after [redacted] weeks gestation and if you do not have high blood pressure Tylenol Vicks Vaporub Zinc lozenges Zyrtec   Constipation: Colace Ducolax suppositories Fleet enema Glycerin suppositories Metamucil Milk of magnesia Miralax Senokot Smooth move tea  Diarrhea: Kaopectate Imodium A-D  *NO pepto Bismol  Hemorrhoids: Anusol Anusol HC Preparation H Tucks  Indigestion: Tums Maalox Mylanta Zantac  Pepcid  Insomnia: Benadryl (alcohol free) $RemoveBefore'25mg'UtryKTemhAOeL$  every 6 hours as needed Tylenol PM Unisom, no Gelcaps  Leg Cramps: Tums MagGel  Nausea/Vomiting:  [place] Dramamine Emetrol Ginger extract Sea bands Meclizine  Nausea medication to take during pregnancy:  Unisom (doxylamine succinate 25 mg tablets) Take one tablet daily at bedtime. If symptoms are not adequately controlled, the dose can be increased to a maximum recommended dose of two tablets daily (1/2 tablet in the morning, 1/2 tablet  mid-afternoon and one at bedtime). Vitamin B6 $RemoveBe'100mg'JWGfeNcMU$  tablets. Take one tablet twice a day (up to 200 mg per day).  Skin Rashes: Aveeno products Benadryl cream or $Remove'25mg'prehghg$  every 6 hours as needed Calamine Lotion 1% cortisone cream  Yeast infection: Gyne-lotrimin 7 Monistat 7   **If taking multiple medications, please check labels to avoid duplicating the same active ingredients **take medication as directed on the label ** Do not exceed 4000 mg of tylenol in 24 hours **Do not take medications that contain aspirin or ibuprofen

## 2018-08-30 NOTE — MAU Provider Note (Addendum)
CC:  Chief Complaint  Patient presents with  . Contractions  . Vaginal Discharge     First Provider Initiated Contact with Patient 08/30/18 [id number]      HPI: Angela Branch is a 22 y.o. year old G51P1011 female at [redacted]w[redacted]d weeks gestation who presents to MAU reporting contractions every 2-4 minutes since this morning and few episodes of leaking scant amount of clear fluid. Was hospitalized 08/13/18 for Dx PPROM, but repeated F/U testing was negative.   Associated Sx:  Vaginal bleeding: denies Leaking of fluid: Scant Fetal movement: Nml  O:  Patient Vitals for the past 24 hrs:  BP Temp Pulse Resp Weight  08/30/18 0812 (!) 106/58 98.3 F (36.8 C) 88 18 63 kg    General: NAD Head: Nasal congestion and rhinorrhea Psych: Flat affect, blunt, tearful when found to NOT be dilated Heart: Regular rate Lungs: Normal rate and effort Abd: Soft, NT, Gravid, S=D Pelvic: NEFG, Neg pooling, Neg  blood.  Dilation: Closed Effacement (%): Thick Exam by:: Manya Silvas CNM   EFM: 150, Moderate variability, 15 x 15 accelerations, no decelerations Toco: Irreg w/ UI  Results for orders placed or performed during the hospital encounter of 08/30/18 (from the past 24 hour(s))  Urinalysis, Routine w reflex microscopic     Status: Abnormal   Collection Time: 08/30/18  8:42 AM  Result Value Ref Range   Color, Urine YELLOW YELLOW   APPearance CLEAR CLEAR   Specific Gravity, Urine 1.011 1.005 - 1.030   pH 7.0 5.0 - 8.0   Glucose, UA NEGATIVE NEGATIVE mg/dL   Hgb urine dipstick NEGATIVE NEGATIVE   Bilirubin Urine NEGATIVE NEGATIVE   Ketones, ur NEGATIVE NEGATIVE mg/dL   Protein, ur NEGATIVE NEGATIVE mg/dL   Nitrite NEGATIVE NEGATIVE   Leukocytes, UA SMALL (A) NEGATIVE   RBC / HPF 0-5 0 - 5 RBC/hpf   WBC, UA 6-10 0 - 5 WBC/hpf   Bacteria, UA NONE SEEN NONE SEEN   Squamous Epithelial / LPF 0-5 0 - 5   Mucus PRESENT   POCT fern test     Status: None   Collection Time: 08/30/18  9:31 AM  Result  Value Ref Range   POCT Fern Test Negative = intact amniotic membranes      Orders Placed This Encounter  Procedures  . Urinalysis, Routine w reflex microscopic  . POCT fern test  . Discharge patient   Meds ordered this encounter  Medications  . zolpidem (AMBIEN) 5 MG tablet    Sig: Take 1 tablet (5 mg total) by mouth at bedtime as needed for sleep.    Dispense:  20 tablet    Refill:  0    Order Specific Question:   Supervising Provider    Answer:   Aletha Halim [[id number]]    A: [redacted]w[redacted]d week IUP Braxton Hicks--Suspect pt is not tolerating them well due to poor sleep. FHR reactive No evidence of ROM URI  P: Discharge home in stable condition. Preterm labor precautions and fetal kick counts. Comfort measures, push fluids Ambien PRN for sleep List of cold medicine safe in pregnancy given Follow-up as scheduled for prenatal visit or sooner as needed if symptoms worsen. Return to maternity admissions as needed if symptoms worsen.  Tamala Julian, [place], [place] 08/30/2018 9:35 AM  3

## 2018-09-08 ENCOUNTER — Encounter: Payer: Self-pay | Admitting: Family Medicine

## 2018-09-08 ENCOUNTER — Ambulatory Visit: Payer: Self-pay

## 2018-09-08 ENCOUNTER — Ambulatory Visit (INDEPENDENT_AMBULATORY_CARE_PROVIDER_SITE_OTHER): Payer: Medicaid Other | Admitting: Student

## 2018-09-08 ENCOUNTER — Other Ambulatory Visit (HOSPITAL_COMMUNITY)
Admission: RE | Admit: 2018-09-08 | Discharge: 2018-09-08 | Disposition: A | Discharge status: Home / Self Care | Payer: Medicaid Other | Source: Ambulatory Visit | Attending: Internal Medicine | Admitting: Internal Medicine

## 2018-09-08 VITALS — BP 110/59 | HR 79 | Wt 141.7 lb

## 2018-09-08 DIAGNOSIS — O98313 Other infections with a predominantly sexual mode of transmission complicating pregnancy, third trimester: Secondary | ICD-10-CM | POA: Insufficient documentation

## 2018-09-08 DIAGNOSIS — K219 Gastro-esophageal reflux disease without esophagitis: Secondary | ICD-10-CM | POA: Diagnosis not present

## 2018-09-08 DIAGNOSIS — O368130 Decreased fetal movements, third trimester, not applicable or unspecified: Secondary | ICD-10-CM

## 2018-09-08 DIAGNOSIS — B009 Herpesviral infection, unspecified: Secondary | ICD-10-CM | POA: Diagnosis not present

## 2018-09-08 DIAGNOSIS — O99613 Diseases of the digestive system complicating pregnancy, third trimester: Secondary | ICD-10-CM | POA: Diagnosis not present

## 2018-09-08 DIAGNOSIS — O99343 Other mental disorders complicating pregnancy, third trimester: Secondary | ICD-10-CM | POA: Insufficient documentation

## 2018-09-08 DIAGNOSIS — Z3A35 35 weeks gestation of pregnancy: Secondary | ICD-10-CM | POA: Diagnosis not present

## 2018-09-08 DIAGNOSIS — A638 Other specified predominantly sexually transmitted diseases: Secondary | ICD-10-CM | POA: Diagnosis not present

## 2018-09-08 DIAGNOSIS — Z3480 Encounter for supervision of other normal pregnancy, unspecified trimester: Secondary | ICD-10-CM

## 2018-09-08 DIAGNOSIS — F4323 Adjustment disorder with mixed anxiety and depressed mood: Secondary | ICD-10-CM | POA: Diagnosis not present

## 2018-09-08 DIAGNOSIS — O368190 Decreased fetal movements, unspecified trimester, not applicable or unspecified: Secondary | ICD-10-CM | POA: Insufficient documentation

## 2018-09-08 LAB — OB RESULTS CONSOLE GC/CHLAMYDIA: Gonorrhea: NEGATIVE

## 2018-09-08 LAB — OB RESULTS CONSOLE GBS: STREP GROUP B AG: NEGATIVE

## 2018-09-08 MED ORDER — VALACYCLOVIR HCL 1 G PO TABS: 1000.0000 mg | ORAL_TABLET | Freq: Two times a day (BID) | ORAL | 1 refills | Status: DC

## 2018-09-08 NOTE — Progress Notes (Signed)
I have reviewed this chart and agree with the RN/CMA assessment and management.    Rhyan Radler C Caelen Reierson, MD, FACOG Attending Physician, [facility]  

## 2018-09-08 NOTE — Progress Notes (Signed)
PRENATAL VISIT NOTE  Subjective:  Angela Branch is a 22 y.o. G3P1011 at [redacted]w[redacted]d being seen today for ongoing prenatal care.  She is currently monitored for the following issues for this low-risk pregnancy and has Asthma; Eczema; GERD (gastroesophageal reflux disease); Adjustment disorder with mixed anxiety and depressed mood; Bacterial vaginitis; Supervision of other normal pregnancy, antepartum; Thyroid disease; Herpes simplex type 2 infection; Preterm premature rupture of membranes (PPROM) with unknown onset of labor; and Decreased fetal movements on their problem list.  Patient reports that she is tired of being pregnant and is in a lot of pain. She wants to be induced at 37 weeks and states that someone told her that was possible. She says that she got her beta methasone and does not know why we can't induce her. She also says that she is still having increased vaginal discharge, which she thinks is amniotic fluid although she says that it is yellow. Yesterday she had pink bleeding on her toilet paper but it did not get worse. She also states that the baby is not moving as much as usual. .  Contractions: Irregular. Vag. Bleeding: Moderate.  Movement: (!) Decreased. Denies leaking of fluid.   The following portions of the patient's history were reviewed and updated as appropriate: allergies, current medications, past family history, past medical history, past social history, past surgical history and problem list. Problem list updated.  Objective:   Vitals:   09/08/18 0855  BP: (!) 110/59  Pulse: 79  Weight: 141 lb 11.2 oz (64.3 kg)    Fetal Status: Fetal Heart Rate (bpm): 146 Fundal Height: 36 cm Movement: (!) Decreased  Presentation: Vertex  General:  Alert, oriented and cooperative. Patient is in no acute distress.  Skin: Skin is warm and dry. No rash noted.   Cardiovascular: Normal heart rate noted  Respiratory: Normal respiratory effort, no problems with respiration noted    Abdomen: Soft, gravid, appropriate for gestational age.  Pain/Pressure: Present     Pelvic: Cervical exam performed Dilation: 1 Effacement (%): 30 Station: Ballotable Sterile speculum exam shows copious white discharge in the vagina but no pooling, no odor, no lesions on vaginal walls or cervix. No CMT.   Extremities: Normal range of motion.  Edema: Trace  Mental Status: Normal mood and affect. Normal behavior. Normal judgment and thought content.   Assessment and Plan:  Pregnancy: G3P1011 at [redacted]w[redacted]d  1. Supervision of other normal pregnancy, antepartum - Culture, beta strep (group b only) - Cervicovaginal ancillary only with wet prep  2. Decreased fetal movements in third trimester, single or unspecified fetus -AFI in office today is 13  - Fetal nonstress test: Baseline is 135, present acel, neg decels, mod var, irregular contractions -Reviewed fetal kick counts with patient  Explained that 37 weeks is the minimum gestation we like to complete before baby is delivered but that 40 is ideal. We would not induce her before 40 or 41 weeks unless she had a complication, which she does not currently have. If that plan is to change, we will re-evaluate weekly.   3. Herpes simplex type 2 infection Patient told nurse she was taking valtrex but told me that she was not. Reviewed importance of taking valtrex and that she should start tomorrow.   Term labor symptoms and general obstetric precautions including but not limited to vaginal bleeding, contractions, leaking of fluid and fetal movement were reviewed in detail with the patient. Please refer to After Visit Summary for other counseling recommendations.  No follow-ups on file.  Future Appointments  Date Time Provider [place]  09/16/2018  3:55 PM Emily Filbert, MD [facility]  09/23/2018  9:15 AM Emily Filbert, MD [facility]  10/01/2018 10:55 AM Emily Filbert, MD [facility] [place]  10/07/2018 10:55 AM Emily Filbert, MD [place], [place]

## 2018-09-08 NOTE — Patient Instructions (Signed)

## 2018-09-08 NOTE — Progress Notes (Signed)
Pt informed that the ultrasound is considered a limited OB ultrasound and is not intended to be a complete ultrasound exam.  Patient also informed that the ultrasound is not being completed with the intent of assessing for fetal or placental anomalies or any pelvic abnormalities.  Explained that the purpose of today's ultrasound is to assess for presentation and amniotic fluid volume.  Patient acknowledges the purpose of the exam and the limitations of the study.

## 2018-09-09 LAB — CERVICOVAGINAL ANCILLARY ONLY
Bacterial vaginitis: POSITIVE — AB
CANDIDA VAGINITIS: NEGATIVE
CHLAMYDIA, DNA PROBE: NEGATIVE
Neisseria Gonorrhea: NEGATIVE
Trichomonas: NEGATIVE

## 2018-09-10 ENCOUNTER — Inpatient Hospital Stay (EMERGENCY_DEPARTMENT_HOSPITAL)
Admission: AD | Admit: 2018-09-10 | Discharge: 2018-09-11 | Disposition: A | Discharge status: Home / Self Care | Payer: Medicaid Other | Source: Ambulatory Visit | Attending: Obstetrics & Gynecology | Admitting: Obstetrics & Gynecology

## 2018-09-10 ENCOUNTER — Inpatient Hospital Stay (HOSPITAL_COMMUNITY)
Admission: AD | Admit: 2018-09-10 | Discharge: 2018-09-10 | Disposition: A | Discharge status: Home / Self Care | Payer: Medicaid Other | Source: Ambulatory Visit | Attending: Obstetrics and Gynecology | Admitting: Obstetrics and Gynecology

## 2018-09-10 ENCOUNTER — Encounter (HOSPITAL_COMMUNITY): Payer: Self-pay

## 2018-09-10 ENCOUNTER — Other Ambulatory Visit: Payer: Self-pay | Admitting: Student

## 2018-09-10 ENCOUNTER — Encounter (HOSPITAL_COMMUNITY): Payer: Self-pay | Admitting: *Deleted

## 2018-09-10 ENCOUNTER — Telehealth: Payer: Self-pay | Admitting: Student

## 2018-09-10 DIAGNOSIS — B9689 Other specified bacterial agents as the cause of diseases classified elsewhere: Secondary | ICD-10-CM

## 2018-09-10 DIAGNOSIS — Z3A36 36 weeks gestation of pregnancy: Secondary | ICD-10-CM | POA: Diagnosis not present

## 2018-09-10 DIAGNOSIS — O4693 Antepartum hemorrhage, unspecified, third trimester: Secondary | ICD-10-CM

## 2018-09-10 DIAGNOSIS — O479 False labor, unspecified: Secondary | ICD-10-CM

## 2018-09-10 DIAGNOSIS — O4700 False labor before 37 completed weeks of gestation, unspecified trimester: Secondary | ICD-10-CM

## 2018-09-10 DIAGNOSIS — Z3480 Encounter for supervision of other normal pregnancy, unspecified trimester: Secondary | ICD-10-CM

## 2018-09-10 DIAGNOSIS — N760 Acute vaginitis: Secondary | ICD-10-CM

## 2018-09-10 LAB — AMNISURE RUPTURE OF MEMBRANE (ROM) NOT AT ARMC: AMNISURE: NEGATIVE

## 2018-09-10 MED ORDER — METRONIDAZOLE 500 MG PO TABS: 500.0000 mg | ORAL_TABLET | Freq: Two times a day (BID) | ORAL | 0 refills | Status: DC

## 2018-09-10 MED ORDER — METRONIDAZOLE 0.75 % VA GEL: 1.0000 | Freq: Two times a day (BID) | VAGINAL | 0 refills | Status: DC

## 2018-09-10 MED ORDER — MORPHINE SULFATE (PF) 10 MG/ML IV SOLN
10.0000 mg | Freq: Once | INTRAVENOUS | Status: AC
  Administered 2018-09-10: 10 mg via INTRAMUSCULAR
  Filled 2018-09-10: qty 1

## 2018-09-10 MED ORDER — NIFEDIPINE 10 MG PO CAPS
10.0000 mg | ORAL_CAPSULE | ORAL | Status: AC | PRN
  Administered 2018-09-10 (×4): 10 mg via ORAL
  Filled 2018-09-10 (×4): qty 1

## 2018-09-10 MED ORDER — MORPHINE SULFATE (PF) 10 MG/ML IV SOLN: Freq: Once | INTRAVENOUS | Status: DC

## 2018-09-10 NOTE — MAU Note (Signed)
PT SAYS SHE WAS  HERE THIS AM - FOR LABOR CHECK -  VE  1 CM-  - D/C HOME  TOLD  TO RETURN  ON Friday -  FOR RECHECK.    HAS HX  OF HSV- LAST OUTBREAK-  1ST TRIMESTER .   TAKES VALTREX  EVERYDAY.  DENIES ANY S/S  OF AN OUTBREAK NOW.    UC'S SAME AS  BEFORE.

## 2018-09-10 NOTE — Discharge Instructions (Signed)
Braxton Hicks Contractions °Contractions of the uterus can occur throughout pregnancy, but they are not always a sign that you are in labor. You may have practice contractions called Braxton Hicks contractions. These false labor contractions are sometimes confused with true labor. °What are Braxton Hicks contractions? °Braxton Hicks contractions are tightening movements that occur in the muscles of the uterus before labor. Unlike true labor contractions, these contractions do not result in opening (dilation) and thinning of the cervix. Toward the end of pregnancy (32-34 weeks), Braxton Hicks contractions can happen more often and may become stronger. These contractions are sometimes difficult to tell apart from true labor because they can be very uncomfortable. You should not feel embarrassed if you go to the hospital with false labor. °Sometimes, the only way to tell if you are in true labor is for your health care provider to look for changes in the cervix. The health care provider will do a physical exam and may monitor your contractions. If you are not in true labor, the exam should show that your cervix is not dilating and your water has not broken. °If there are other health problems associated with your pregnancy, it is completely safe for you to be sent home with false labor. You may continue to have Braxton Hicks contractions until you go into true labor. °How to tell the difference between true labor and false labor °True labor °· Contractions last 30-70 seconds. °· Contractions become very regular. °· Discomfort is usually felt in the top of the uterus, and it spreads to the lower abdomen and low back. °· Contractions do not go away with walking. °· Contractions usually become more intense and increase in frequency. °· The cervix dilates and gets thinner. °False labor °· Contractions are usually shorter and not as strong as true labor contractions. °· Contractions are usually irregular. °· Contractions  are often felt in the front of the lower abdomen and in the groin. °· Contractions may go away when you walk around or change positions while lying down. °· Contractions get weaker and are shorter-lasting as time goes on. °· The cervix usually does not dilate or become thin. °Follow these instructions at home: °· Take over-the-counter and prescription medicines only as told by your health care provider. °· Keep up with your usual exercises and follow other instructions from your health care provider. °· Eat and drink lightly if you think you are going into labor. °· If Braxton Hicks contractions are making you uncomfortable: °? Change your position from lying down or resting to walking, or change from walking to resting. °? Sit and rest in a tub of warm water. °? Drink enough fluid to keep your urine pale yellow. Dehydration may cause these contractions. °? Do slow and deep breathing several times an hour. °· Keep all follow-up prenatal visits as told by your health care provider. This is important. °Contact a health care provider if: °· You have a fever. °· You have continuous pain in your abdomen. °Get help right away if: °· Your contractions become stronger, more regular, and closer together. °· You have fluid leaking or gushing from your vagina. °· You pass blood-tinged mucus (bloody show). °· You have bleeding from your vagina. °· You have low back pain that you never had before. °· You feel your baby’s head pushing down and causing pelvic pressure. °· Your baby is not moving inside you as much as it used to. °Summary °· Contractions that occur before labor are called Braxton   Hicks contractions, false labor, or practice contractions. °· Braxton Hicks contractions are usually shorter, weaker, farther apart, and less regular than true labor contractions. True labor contractions usually become progressively stronger and regular and they become more frequent. °· Manage discomfort from Braxton Hicks contractions by  changing position, resting in a warm bath, drinking plenty of water, or practicing deep breathing. °This information is not intended to replace advice given to you by your health care provider. Make sure you discuss any questions you have with your health care provider. °Document Released: 04/24/2017 Document Revised: 04/24/2017 Document Reviewed: 04/24/2017 °Elsevier Interactive Patient Education © 2018 [employer] ° °

## 2018-09-10 NOTE — Telephone Encounter (Signed)
Patient notified of BV diagnosis; she requests metrogel rather than Flagyl. Says that her medicaid usually pays for it.   RX for metrogel sent to pharmacy. Patient also states she was in MAU last night for contractions; she states she is coming back tomorrow (Friday) for a re-check of her cervix. She says the clinic is going to be calling her. Informed patient that I will follow-up with the front desk about that appointment.   Angela Branch

## 2018-09-10 NOTE — MAU Note (Signed)
Has some bleeding that was bright red on Monday and then some dark red bleeding tonight that had a blood clot in it.  Also states she has had some thin discharge that couldn't be tested in the office on Tuesday as to whether it was amniotic fluid or not.  + FM.  Not feeling any ctx.

## 2018-09-10 NOTE — MAU Provider Note (Addendum)
None     Chief Complaint:  Labor Eval   Angela Branch is  22 y.o. G3P1011 at [redacted]w[redacted]d presents complaining of Labor Eval She was observed for 5 hours today d/t ctx q 2 minutes, but cx never changed and procardia didn't stop them. Also wants a spec exam to confirm BV dx she was given a few days ago. Hasn't picked up her meds yet.  Obstetrical/Gynecological History: OB History    Gravida  3   Para  1   Term  1   Preterm  0   AB  1   Living  1     SAB  1   TAB  0   Ectopic  0   Multiple  0   Live Births  1          Past Medical History: Past Medical History:  Diagnosis Date  . Anemia   . Anxiety   . Asthma   . Thyroid disease     Past Surgical History: Past Surgical History:  Procedure Laterality Date  . TOOTH EXTRACTION      Family History: Family History  Problem Relation Age of Onset  . Asthma Father   . Diabetes Maternal Grandmother   . Hyperlipidemia Paternal Grandfather   . Heart disease Paternal Grandfather   . Cancer - Colon Maternal Grandfather   . Mental illness Neg Hx   . Birth defects Neg Hx   . Kidney disease Neg Hx   . Hypertension Neg Hx     Social History: Social History   Tobacco Use  . Smoking status: Never Smoker  . Smokeless tobacco: Never Used  Substance Use Topics  . Alcohol use: No    Alcohol/week: 0.0 standard drinks  . Drug use: No    Allergies:  Allergies  Allergen Reactions  . Azithromycin     Diarrhea, nausea/vomiting, IV burns vein    Meds:  Medications Prior to Admission  Medication Sig Dispense Refill Last Dose  . valACYclovir (VALTREX) 1000 MG tablet Take 1 tablet (1,000 mg total) by mouth 2 (two) times daily. 60 tablet 1 09/10/2018 at Unknown time  . albuterol (PROVENTIL HFA;VENTOLIN HFA) 108 (90 Base) MCG/ACT inhaler Inhale 2 puffs into the lungs every 6 (six) hours as needed for wheezing or shortness of breath.   Taking  . metroNIDAZOLE (METROGEL) 0.75 % vaginal gel Place 1 Applicatorful  vaginally 2 (two) times daily. 70 g 0     Review of Systems   Constitutional: Negative for fever and chills Eyes: Negative for visual disturbances Respiratory: Negative for shortness of breath, dyspnea Cardiovascular: Negative for chest pain or palpitations  Gastrointestinal: Negative for vomiting, diarrhea and constipation Genitourinary: Negative for dysuria and urgency Musculoskeletal: Negative for back pain, joint pain, myalgias.  Normal ROM  Neurological: Negative for dizziness and headaches    Physical Exam  Blood pressure (!) 99/54, pulse 73, temperature 98.2 F (36.8 C), temperature source Oral, resp. rate 16, height 5' 2.5" (1.588 m), weight 64.1 kg, last menstrual period 12/31/2017. GENERAL: Well-developed, well-nourished female in no acute distress.  LUNGS: Clear to auscultation bilaterally.  HEART: Regular rate and rhythm. ABDOMEN: Soft, nontender, nondistended, gravid.  EXTREMITIES: Nontender, no edema, 2+ distal pulses. DTR's 2+ CERVICAL EXAM: Dilatation 1cm   Effacement 50%   Station -2   SSE:  Thin white dc w/amine odor, wet prep few WBC and clue cells, no trich or yeast,  Presentation: cephalic FHT:  Baseline rate 140 bpm   Variability moderate  Accelerations present   Decelerations none Contractions: Every 2-3 mins, mild   Labs: Results for orders placed or performed during the hospital encounter of 09/10/18 (from the past 24 hour(s))  Amnisure rupture of membrane (rom)not at [facility]   Collection Time: 09/10/18  3:32 AM  Result Value Ref Range   Amnisure ROM NEGATIVE    Imaging Studies:   Assessment: Angela Branch is  22 y.o. G3P1011 at [redacted]w[redacted]d presents with BV, false labor.  Plan: Pick up metrogel that was called to pharmacy earlier today. MS for therapeutic rest.   Christin Fudge 9/19/201910:44 PM

## 2018-09-10 NOTE — MAU Provider Note (Signed)
Chief Complaint:  Vaginal Bleeding   First Provider Initiated Contact with Patient 09/10/18 0157     HPI: Angela Branch is a 22 y.o. G3P1011 at 80w1dwho presents to maternity admissions reporting uterine contractions and bright red bleeding.  .Wants to be induced. She reports good fetal movement, denies LOF, vaginal bleeding, vaginal itching/burning, urinary symptoms, h/a, dizziness, n/v, diarrhea, constipation or fever/chills.  She denies headache, visual changes or RUQ abdominal pain.  Vaginal Bleeding  The patient's primary symptoms include pelvic pain and vaginal bleeding. The patient's pertinent negatives include no genital itching, genital lesions or genital odor. This is a recurrent problem. The problem has been unchanged. The problem affects both sides. She is pregnant. Associated symptoms include abdominal pain. Pertinent negatives include no constipation, diarrhea, dysuria, fever, headaches or nausea. The vaginal discharge was bloody. The vaginal bleeding is lighter than menses. She has been passing clots. She has not been passing tissue. Nothing aggravates the symptoms. She has tried nothing for the symptoms.   RN note: Has some bleeding that was bright red on Monday and then some dark red bleeding tonight that had a blood clot in it.  Also states she has had some thin discharge that couldn't be tested in the office on Tuesday as to whether it was amniotic fluid or not.  + FM.  Not feeling any ctx.   Past Medical History: Past Medical History:  Diagnosis Date  . Anemia   . Anxiety   . Asthma   . Thyroid disease     Past obstetric history: OB History  Gravida Para Term Preterm AB Living  $Remov'3 1 1 'GYtIVY$ 0 1 1  SAB TAB Ectopic Multiple Live Births  1 0 0 0 1    # Outcome Date GA Lbr Len/2nd Weight Sex Delivery Anes PTL Lv  3 Current           2 Term 12/01/15 [redacted]w[redacted]d 16:59 / 03:13 3245 g M Vag-Spont EPI  LIV     Birth Comments: Hgb, Normal, Fa Newborn Screen Barcode: [id number] Date  collected: 12/02/2015  1 SAB 2015             Birth Comments: not seen in hospital, not sure how far she was    Past Surgical History: Past Surgical History:  Procedure Laterality Date  . TOOTH EXTRACTION      Family History: Family History  Problem Relation Age of Onset  . Asthma Father   . Diabetes Maternal Grandmother   . Hyperlipidemia Paternal Grandfather   . Heart disease Paternal Grandfather   . Cancer - Colon Maternal Grandfather   . Mental illness Neg Hx   . Birth defects Neg Hx   . Kidney disease Neg Hx   . Hypertension Neg Hx     Social History: Social History   Tobacco Use  . Smoking status: Never Smoker  . Smokeless tobacco: Never Used  Substance Use Topics  . Alcohol use: No    Alcohol/week: 0.0 standard drinks  . Drug use: No    Allergies:  Allergies  Allergen Reactions  . Azithromycin     Diarrhea, nausea/vomiting, IV burns vein    Meds:  Medications Prior to Admission  Medication Sig Dispense Refill Last Dose  . albuterol (PROVENTIL HFA;VENTOLIN HFA) 108 (90 Base) MCG/ACT inhaler Inhale 2 puffs into the lungs every 6 (six) hours as needed for wheezing or shortness of breath.   Taking  . valACYclovir (VALTREX) 1000 MG tablet Take 1 tablet (1,000 mg total)  by mouth 2 (two) times daily. 60 tablet 1     I have reviewed patient's Past Medical Hx, Surgical Hx, Family Hx, Social Hx, medications and allergies.   ROS:  Review of Systems  Constitutional: Negative for fever.  Gastrointestinal: Positive for abdominal pain. Negative for constipation, diarrhea and nausea.  Genitourinary: Positive for pelvic pain and vaginal bleeding. Negative for dysuria.  Neurological: Negative for headaches.   Other systems negative  Physical Exam   Patient Vitals for the past 24 hrs:  BP Temp Pulse Resp Height  09/10/18 0138 105/69 98.9 F (37.2 C) 79 19 5' 2.5" (1.588 m)   Constitutional: Well-developed, well-nourished female in no acute distress.   Cardiovascular: normal rate and rhythm Respiratory: normal effort, clear to auscultation bilaterally GI: Abd soft, non-tender, gravid appropriate for gestational age.   No rebound or guarding. MS: Extremities nontender, no edema, normal ROM Neurologic: Alert and oriented x 4.  GU: Neg CVAT.  PELVIC EXAM: Cervix pink, visually closed, without lesion, scant red discharge, vaginal walls and external genitalia normal Bimanual exam: Cervix firm, posterior, neg CMT, uterus nontender, Fundal Height consistent with dates, adnexa without tenderness, enlargement, or mass   FHT:  Baseline 140 , moderate variability, accelerations present, no decelerations Contractions: q 2-4 mins Irregular     Labs: --/--/A POS (08/25 0827) Results for orders placed or performed during the hospital encounter of 09/10/18 (from the past 48 hour(s))  Amnisure rupture of membrane (rom)not at [facility]     Status: None   Collection Time: 09/10/18  3:32 AM  Result Value Ref Range   Amnisure ROM NEGATIVE     Comment: Performed at [facility], [address], [place], [place] [address]    Imaging:    MAU Course/MDM: I have ordered labs and reviewed results. Fetal fibronectin is negative NST reviewed and is reactive Consult Dr Glo Herring with presentation, exam findings and test results.  Treatments in MAU included Procardia given $RemoveBeforeD'10mg'iRMzKVahBeGRpc$  q 82min x 4 UCs slowed and then returned Cervix did not change over time (4.5 hours) and Fetal fibronectin was negative Per Dr Glo Herring, we will not try to stop labor anymore but will not induce.  He recommends discharge home with labor precautions.  Followup in office Friday for recheck of cervix.  Patient is tearful and not happy with this plan.  Just wants to be induced now. States her cervix "cannot dilate but that does not mean I am not in labor, my other baby was a long time with no dilation and they had to induce me (full term)"  I explained that does not mean she cannot dilate  but that the latent phase of labor can last a long time. .    Assessment: SIUP at [redacted]w[redacted]d Preterm contractions with no change in cervix Negative fetal fibronectin.  Plan: Discharge home Preterm Labor precautions and fetal kick counts Follow up in Office for prenatal visits and recheck of cervix Friday  Encouraged to return here or to other Urgent Care/ED if she develops worsening of symptoms, increase in pain, fever, or other concerning symptoms.   Pt stable at time of discharge.  Hansel Feinstein CNM, MSN Certified Nurse-Midwife 09/10/2018 1:58 AM

## 2018-09-11 ENCOUNTER — Encounter: Payer: Self-pay | Admitting: Internal Medicine

## 2018-09-11 LAB — CULTURE, BETA STREP (GROUP B ONLY): Strep Gp B Culture: NEGATIVE

## 2018-09-14 ENCOUNTER — Encounter (HOSPITAL_COMMUNITY): Payer: Self-pay | Admitting: *Deleted

## 2018-09-14 ENCOUNTER — Inpatient Hospital Stay (HOSPITAL_COMMUNITY)
Admission: AD | Admit: 2018-09-14 | Discharge: 2018-09-15 | Disposition: A | Discharge status: Home / Self Care | Payer: Medicaid Other | Source: Ambulatory Visit | Attending: Obstetrics and Gynecology | Admitting: Obstetrics and Gynecology

## 2018-09-14 DIAGNOSIS — N760 Acute vaginitis: Secondary | ICD-10-CM

## 2018-09-14 DIAGNOSIS — Z3A37 37 weeks gestation of pregnancy: Secondary | ICD-10-CM | POA: Insufficient documentation

## 2018-09-14 DIAGNOSIS — B9689 Other specified bacterial agents as the cause of diseases classified elsewhere: Secondary | ICD-10-CM

## 2018-09-14 DIAGNOSIS — O471 False labor at or after 37 completed weeks of gestation: Secondary | ICD-10-CM | POA: Insufficient documentation

## 2018-09-14 DIAGNOSIS — Z3480 Encounter for supervision of other normal pregnancy, unspecified trimester: Secondary | ICD-10-CM

## 2018-09-14 DIAGNOSIS — O479 False labor, unspecified: Secondary | ICD-10-CM

## 2018-09-14 MED ORDER — MORPHINE SULFATE (PF) 4 MG/ML IV SOLN
2.0000 mg | Freq: Once | INTRAVENOUS | Status: AC
  Administered 2018-09-15: 2 mg via INTRAMUSCULAR
  Filled 2018-09-14: qty 1

## 2018-09-14 MED ORDER — PROMETHAZINE HCL 25 MG/ML IJ SOLN
25.0000 mg | Freq: Once | INTRAMUSCULAR | Status: AC
  Administered 2018-09-15: 25 mg via INTRAMUSCULAR
  Filled 2018-09-14: qty 1

## 2018-09-15 DIAGNOSIS — Z3A37 37 weeks gestation of pregnancy: Secondary | ICD-10-CM | POA: Diagnosis not present

## 2018-09-15 DIAGNOSIS — O471 False labor at or after 37 completed weeks of gestation: Secondary | ICD-10-CM | POA: Diagnosis not present

## 2018-09-15 NOTE — Discharge Instructions (Signed)
Braxton Hicks Contractions °Contractions of the uterus can occur throughout pregnancy, but they are not always a sign that you are in labor. You may have practice contractions called Braxton Hicks contractions. These false labor contractions are sometimes confused with true labor. °What are Braxton Hicks contractions? °Braxton Hicks contractions are tightening movements that occur in the muscles of the uterus before labor. Unlike true labor contractions, these contractions do not result in opening (dilation) and thinning of the cervix. Toward the end of pregnancy (32-34 weeks), Braxton Hicks contractions can happen more often and may become stronger. These contractions are sometimes difficult to tell apart from true labor because they can be very uncomfortable. You should not feel embarrassed if you go to the hospital with false labor. °Sometimes, the only way to tell if you are in true labor is for your health care provider to look for changes in the cervix. The health care provider will do a physical exam and may monitor your contractions. If you are not in true labor, the exam should show that your cervix is not dilating and your water has not broken. °If there are other health problems associated with your pregnancy, it is completely safe for you to be sent home with false labor. You may continue to have Braxton Hicks contractions until you go into true labor. °How to tell the difference between true labor and false labor °True labor °· Contractions last 30-70 seconds. °· Contractions become very regular. °· Discomfort is usually felt in the top of the uterus, and it spreads to the lower abdomen and low back. °· Contractions do not go away with walking. °· Contractions usually become more intense and increase in frequency. °· The cervix dilates and gets thinner. °False labor °· Contractions are usually shorter and not as strong as true labor contractions. °· Contractions are usually irregular. °· Contractions  are often felt in the front of the lower abdomen and in the groin. °· Contractions may go away when you walk around or change positions while lying down. °· Contractions get weaker and are shorter-lasting as time goes on. °· The cervix usually does not dilate or become thin. °Follow these instructions at home: °· Take over-the-counter and prescription medicines only as told by your health care provider. °· Keep up with your usual exercises and follow other instructions from your health care provider. °· Eat and drink lightly if you think you are going into labor. °· If Braxton Hicks contractions are making you uncomfortable: °? Change your position from lying down or resting to walking, or change from walking to resting. °? Sit and rest in a tub of warm water. °? Drink enough fluid to keep your urine pale yellow. Dehydration may cause these contractions. °? Do slow and deep breathing several times an hour. °· Keep all follow-up prenatal visits as told by your health care provider. This is important. °Contact a health care provider if: °· You have a fever. °· You have continuous pain in your abdomen. °Get help right away if: °· Your contractions become stronger, more regular, and closer together. °· You have fluid leaking or gushing from your vagina. °· You pass blood-tinged mucus (bloody show). °· You have bleeding from your vagina. °· You have low back pain that you never had before. °· You feel your baby’s head pushing down and causing pelvic pressure. °· Your baby is not moving inside you as much as it used to. °Summary °· Contractions that occur before labor are called Braxton   Hicks contractions, false labor, or practice contractions. °· Braxton Hicks contractions are usually shorter, weaker, farther apart, and less regular than true labor contractions. True labor contractions usually become progressively stronger and regular and they become more frequent. °· Manage discomfort from Braxton Hicks contractions by  changing position, resting in a warm bath, drinking plenty of water, or practicing deep breathing. °This information is not intended to replace advice given to you by your health care provider. Make sure you discuss any questions you have with your health care provider. °Document Released: 04/24/2017 Document Revised: 04/24/2017 Document Reviewed: 04/24/2017 °Elsevier Interactive Patient Education © 2018 [employer] ° °

## 2018-09-15 NOTE — MAU Note (Signed)
I have communicated with Dr. Juleen China and reviewed vital signs:  Vitals:   09/14/18 2146 09/15/18 0018  BP: (!) 110/56 (!) 103/54  Pulse: 90 87  Resp: 15   Temp: 98.2 F (36.8 C)     Vaginal exam:  Dilation: 3 Effacement (%): 70, 80 Cervical Position: Middle Station: -2 Presentation: Vertex Exam by:: Joya Gaskins RN,   Also reviewed contraction pattern and that non-stress test is reactive.  It has been documented that patient is contracting every 2-4  minutes with no cervical change over 1 1/2 hours not indicating active labor.  Patient denies any other complaints.  Based on this report provider has given order for discharge.  A discharge order and diagnosis entered by a provider.   Labor discharge instructions reviewed with patient.

## 2018-09-16 ENCOUNTER — Ambulatory Visit (INDEPENDENT_AMBULATORY_CARE_PROVIDER_SITE_OTHER): Payer: Medicaid Other | Admitting: Obstetrics & Gynecology

## 2018-09-16 VITALS — BP 101/61 | HR 106 | Wt 139.5 lb

## 2018-09-16 DIAGNOSIS — Z3480 Encounter for supervision of other normal pregnancy, unspecified trimester: Secondary | ICD-10-CM

## 2018-09-16 DIAGNOSIS — Z3483 Encounter for supervision of other normal pregnancy, third trimester: Secondary | ICD-10-CM

## 2018-09-16 NOTE — Progress Notes (Signed)
   PRENATAL VISIT NOTE  Subjective:  Angela Branch is a 22 y.o. G3P1011 at [redacted]w[redacted]d being seen today for ongoing prenatal care.  She is currently monitored for the following issues for this low-risk pregnancy and has Asthma; Eczema; GERD (gastroesophageal reflux disease); Adjustment disorder with mixed anxiety and depressed mood; Bacterial vaginitis; Supervision of other normal pregnancy, antepartum; Thyroid disease; Herpes simplex type 2 infection; Preterm premature rupture of membranes (PPROM) with unknown onset of labor; and Decreased fetal movements on their problem list.  Patient reports backache.  Contractions: Regular. Vag. Bleeding: None.  Movement: Present. Denies leaking of fluid.   The following portions of the patient's history were reviewed and updated as appropriate: allergies, current medications, past family history, past medical history, past social history, past surgical history and problem list. Problem list updated.  Objective:   Vitals:   09/16/18 1612  BP: 101/61  Pulse: (!) 106  Weight: 139 lb 8 oz (63.3 kg)    Fetal Status: Fetal Heart Rate (bpm): 154   Movement: Present     General:  Alert, oriented and cooperative. Patient is in no acute distress.  Skin: Skin is warm and dry. No rash noted.   Cardiovascular: Normal heart rate noted  Respiratory: Normal respiratory effort, no problems with respiration noted  Abdomen: Soft, gravid, appropriate for gestational age.  Pain/Pressure: Present     Pelvic: Cervical exam performed        Extremities: Normal range of motion.  Edema: Trace  Mental Status: Normal mood and affect. Normal behavior. Normal judgment and thought content.   Assessment and Plan:  Pregnancy: G3P1011 at [redacted]w[redacted]d  1. Supervision of other normal pregnancy, antepartum   Term labor symptoms and general obstetric precautions including but not limited to vaginal bleeding, contractions, leaking of fluid and fetal movement were reviewed in detail with  the patient. Please refer to After Visit Summary for other counseling recommendations.  No follow-ups on file.  Future Appointments  Date Time Provider [place]  09/23/2018  9:15 AM Emily Filbert, MD [facility]  10/01/2018 10:55 AM Emily Filbert, MD [facility]  10/07/2018 10:55 AM Emily Filbert, MD [facility]    Emily Filbert, MD

## 2018-09-21 ENCOUNTER — Other Ambulatory Visit: Payer: Self-pay

## 2018-09-21 ENCOUNTER — Encounter (HOSPITAL_COMMUNITY): Payer: Self-pay | Admitting: *Deleted

## 2018-09-21 ENCOUNTER — Inpatient Hospital Stay (HOSPITAL_COMMUNITY)
Admission: AD | Admit: 2018-09-21 | Discharge: 2018-09-24 | DRG: 807 | Disposition: A | Discharge status: Home / Self Care | Payer: Medicaid Other | Attending: Family Medicine | Admitting: Family Medicine

## 2018-09-21 ENCOUNTER — Inpatient Hospital Stay (HOSPITAL_BASED_OUTPATIENT_CLINIC_OR_DEPARTMENT_OTHER): Payer: Medicaid Other

## 2018-09-21 DIAGNOSIS — Z3A37 37 weeks gestation of pregnancy: Secondary | ICD-10-CM | POA: Diagnosis not present

## 2018-09-21 DIAGNOSIS — A6000 Herpesviral infection of urogenital system, unspecified: Secondary | ICD-10-CM | POA: Diagnosis present

## 2018-09-21 DIAGNOSIS — O4290 Premature rupture of membranes, unspecified as to length of time between rupture and onset of labor, unspecified weeks of gestation: Secondary | ICD-10-CM | POA: Diagnosis present

## 2018-09-21 DIAGNOSIS — O368390 Maternal care for abnormalities of the fetal heart rate or rhythm, unspecified trimester, not applicable or unspecified: Secondary | ICD-10-CM

## 2018-09-21 DIAGNOSIS — D649 Anemia, unspecified: Secondary | ICD-10-CM | POA: Diagnosis present

## 2018-09-21 DIAGNOSIS — R509 Fever, unspecified: Secondary | ICD-10-CM

## 2018-09-21 DIAGNOSIS — O9902 Anemia complicating childbirth: Secondary | ICD-10-CM | POA: Diagnosis present

## 2018-09-21 DIAGNOSIS — O9832 Other infections with a predominantly sexual mode of transmission complicating childbirth: Principal | ICD-10-CM | POA: Diagnosis present

## 2018-09-21 DIAGNOSIS — J45909 Unspecified asthma, uncomplicated: Secondary | ICD-10-CM | POA: Diagnosis present

## 2018-09-21 DIAGNOSIS — O368330 Maternal care for abnormalities of the fetal heart rate or rhythm, third trimester, not applicable or unspecified: Secondary | ICD-10-CM | POA: Diagnosis not present

## 2018-09-21 DIAGNOSIS — O9952 Diseases of the respiratory system complicating childbirth: Secondary | ICD-10-CM | POA: Diagnosis present

## 2018-09-21 HISTORY — DX: Maternal care for abnormalities of the fetal heart rate or rhythm, unspecified trimester, not applicable or unspecified: O36.8390

## 2018-09-21 LAB — WET PREP, GENITAL
Clue Cells Wet Prep HPF POC: NONE SEEN
SPERM: NONE SEEN
Trich, Wet Prep: NONE SEEN
Yeast Wet Prep HPF POC: NONE SEEN

## 2018-09-21 LAB — TYPE AND SCREEN
ABO/RH(D): A POS
Antibody Screen: NEGATIVE

## 2018-09-21 LAB — CBC
HCT: 34.2 % — ABNORMAL LOW (ref 36.0–46.0)
Hemoglobin: 11.3 g/dL — ABNORMAL LOW (ref 12.0–15.0)
MCH: 28.8 pg (ref 26.0–34.0)
MCHC: 33 g/dL (ref 30.0–36.0)
MCV: 87 fL (ref 78.0–100.0)
Platelets: 148 10*3/uL — ABNORMAL LOW (ref 150–400)
RBC: 3.93 MIL/uL (ref 3.87–5.11)
RDW: 14.1 % (ref 11.5–15.5)
WBC: 15.9 10*3/uL — ABNORMAL HIGH (ref 4.0–10.5)

## 2018-09-21 MED ORDER — ACETAMINOPHEN 500 MG PO TABS: 1000.0000 mg | ORAL_TABLET | Freq: Once | ORAL | Status: DC

## 2018-09-21 MED ORDER — PROMETHAZINE HCL 25 MG/ML IJ SOLN: 12.5000 mg | Freq: Once | INTRAMUSCULAR | Status: DC

## 2018-09-21 MED ORDER — VALACYCLOVIR HCL 500 MG PO TABS
1000.0000 mg | ORAL_TABLET | Freq: Every day | ORAL | Status: DC
  Filled 2018-09-21 (×2): qty 2

## 2018-09-21 MED ORDER — DOCUSATE SODIUM 100 MG PO CAPS: 100.0000 mg | ORAL_CAPSULE | Freq: Every day | ORAL | Status: DC

## 2018-09-21 MED ORDER — MORPHINE SULFATE (PF) 4 MG/ML IV SOLN
5.0000 mg | Freq: Once | INTRAVENOUS | Status: AC
  Administered 2018-09-21: 5 mg via INTRAVENOUS
  Filled 2018-09-21: qty 2

## 2018-09-21 MED ORDER — CALCIUM CARBONATE ANTACID 500 MG PO CHEW: 2.0000 | CHEWABLE_TABLET | ORAL | Status: DC | PRN

## 2018-09-21 MED ORDER — MORPHINE SULFATE (PF) 4 MG/ML IV SOLN
5.0000 mg | Freq: Once | INTRAVENOUS | Status: DC
  Filled 2018-09-21: qty 2

## 2018-09-21 MED ORDER — NALBUPHINE HCL 10 MG/ML IJ SOLN
10.0000 mg | Freq: Once | INTRAMUSCULAR | Status: AC
  Administered 2018-09-21: 10 mg via INTRAVENOUS
  Filled 2018-09-21: qty 1

## 2018-09-21 MED ORDER — ZOLPIDEM TARTRATE 5 MG PO TABS: 5.0000 mg | ORAL_TABLET | Freq: Every evening | ORAL | Status: DC | PRN

## 2018-09-21 MED ORDER — PRENATAL MULTIVITAMIN CH: 1.0000 | ORAL_TABLET | Freq: Every day | ORAL | Status: DC

## 2018-09-21 MED ORDER — ACETAMINOPHEN 325 MG PO TABS: 650.0000 mg | ORAL_TABLET | ORAL | Status: DC | PRN

## 2018-09-21 MED ORDER — LACTATED RINGERS IV BOLUS
1000.0000 mL | Freq: Once | INTRAVENOUS | Status: AC
  Administered 2018-09-21: 1000 mL via INTRAVENOUS

## 2018-09-21 MED ORDER — LACTATED RINGERS IV SOLN
INTRAVENOUS | Status: DC
  Administered 2018-09-21 (×2): via INTRAVENOUS

## 2018-09-21 NOTE — MAU Note (Signed)
Pt presents to MAU with contractions for a couple of hours. Denies any VB or LOF. +FM

## 2018-09-21 NOTE — H&P (Signed)
ANTENATAL ADMISSION HISTORY AND PHYSICAL NOTE  Angela Branch is a 22 y.o. female G66P1011 with IUP at [redacted]w[redacted]d by LMP who presents to MAU with complaints of contractions. She reports contractions that started occurring 2-3 hours prior to arrival to MAU, reports contractions every 4-5 minutes. She reports positive fetal movement. She denies leakage of fluid or vaginal bleeding. She denies of complications during this pregnancy   Prenatal History/Complications: PNC at [facility] Pregnancy complications:  - Thyroid disease - HSV infection   Past Medical History: Past Medical History:  Diagnosis Date  . Anemia   . Anxiety   . Asthma   . Thyroid disease     Past Surgical History: Past Surgical History:  Procedure Laterality Date  . TOOTH EXTRACTION      Obstetrical History: OB History    Gravida  3   Para  1   Term  1   Preterm  0   AB  1   Living  1     SAB  1   TAB  0   Ectopic  0   Multiple  0   Live Births  1           Social History: Social History   Socioeconomic History  . Marital status: Single    Spouse name: Not on file  . Number of children: Not on file  . Years of education: 9th  . Highest education level: Not on file  Occupational History    Employer: UNEMPLOYED  Social Needs  . Financial resource strain: Not on file  . Food insecurity:    Worry: Not on file    Inability: Not on file  . Transportation needs:    Medical: Not on file    Non-medical: Not on file  Tobacco Use  . Smoking status: Never Smoker  . Smokeless tobacco: Never Used  Substance and Sexual Activity  . Alcohol use: No    Alcohol/week: 0.0 standard drinks  . Drug use: No  . Sexual activity: Yes    Birth control/protection: None    Comment: last sex Sep 06 2018  Lifestyle  . Physical activity:    Days per week: Not on file    Minutes per session: Not on file  . Stress: Not on file  Relationships  . Social connections:    Talks on phone: Not on file    Gets  together: Not on file    Attends religious service: Not on file    Active member of club or organization: Not on file    Attends meetings of clubs or organizations: Not on file    Relationship status: Not on file  Other Topics Concern  . Not on file  Social History Narrative  . Not on file    Family History: Family History  Problem Relation Age of Onset  . Asthma Father   . Diabetes Maternal Grandmother   . Hyperlipidemia Paternal Grandfather   . Heart disease Paternal Grandfather   . Cancer - Colon Maternal Grandfather   . Mental illness Neg Hx   . Birth defects Neg Hx   . Kidney disease Neg Hx   . Hypertension Neg Hx     Allergies: Allergies  Allergen Reactions  . Azithromycin     Diarrhea, nausea/vomiting, IV burns vein    Medications Prior to Admission  Medication Sig Dispense Refill Last Dose  . metroNIDAZOLE (METROGEL) 0.75 % vaginal gel Place 1 Applicatorful vaginally 2 (two) times daily.   09/14/2018  .  valACYclovir (VALTREX) 1000 MG tablet Take 1 tablet (1,000 mg total) by mouth 2 (two) times daily. 60 tablet 1 09/21/2018 at Unknown time  . albuterol (PROVENTIL HFA;VENTOLIN HFA) 108 (90 Base) MCG/ACT inhaler Inhale 2 puffs into the lungs every 6 (six) hours as needed for wheezing or shortness of breath.   emergency     Review of Systems  All systems reviewed and negative except as stated in HPI  Physical Exam Blood pressure (!) 106/58, pulse (!) 122, temperature 99.9 F (37.7 C), temperature source Oral, resp. rate 16, last menstrual period 12/31/2017. General appearance: alert, cooperative and mild distress Lungs: clear to auscultation bilaterally Heart: regular rate and rhythm Abdomen: soft, non-tender; bowel sounds normal Extremities: No calf swelling or tenderness Presentation: cephalic by cervical examination  Fetal monitoring: 160/ minimal - moderate variability/ +accels/ occasional variable decel  Uterine activity: 2-6/ mild by palpation  Dilation:  2.5 Effacement (%): 80 Station: -2 Exam by:: Wende Bushy CNM  Prenatal labs: ABO, Rh: --/--/A POS (09/30 1725) Antibody: PENDING (09/30 1725) Rubella: 1.50 (03/21 0942) RPR: Non Reactive (07/17 0914)  HBsAg: Negative (03/21 0942)  HIV: Non Reactive (07/17 0914)  GC/Chlamydia: negative (9/17) GBS:   Negative (9/17) 2 hr Glucola: 80-126-104 Genetic screening:  declined Anatomy US: normal   Nursing Cytogeneticist [place] Dating    Language  English  Anatomy US    Flu Vaccine  Declines  Genetic Screen  NIPS:   WNL  Quad:    TDaP vaccine   07/21/18 Hgb A1C or  GTT Early 5.3 Third trimester 80-126-104  Rhogam  N/A   LAB RESULTS   Feeding Baby Breast    Contraception  doesn't like nex, dep, patch or ring. Willing to try mirena or OCPs (which she liked) Blood Type A/Positive/-- (03/21 1610)   Circumcision Yes  Antibody Negative (03/21 0942)  Pediatrician Unsure  Rubella 1.50 (03/21 0942)  Support Person  Don't know RPR Non Reactive (03/21 0942)   Prenatal Classes  HBsAg Negative (03/21 0942)   BTL Consent  HIV Non Reactive (03/21 0942)  VBAC Consent  GBS   neg    Pap  na     Hgb Electro      CF Neg     SMA neg    Waterbirth  $RemoveBef'[ ]'jxEPuXRQzp$  Class $Remo'[ ]'SMvmc$  Consent $Remove'[ ]'DwcbLuv$  CNM visit    Prenatal Transfer Tool  Maternal Diabetes: No Genetic Screening: Declined Maternal Ultrasounds/Referrals: Normal Fetal Ultrasounds or other Referrals:  None Maternal Substance Abuse:  No Significant Maternal Medications:  Meds include: Other: Valtrex Significant Maternal Lab Results: Lab values include: Group B Strep negative  Results for orders placed or performed during the hospital encounter of 09/21/18 (from the past 24 hour(s))  CBC   Collection Time: 09/21/18  5:25 PM  Result Value Ref Range   WBC 15.9 (H) 4.0 - 10.5 K/uL   RBC 3.93 3.87 - 5.11 MIL/uL   Hemoglobin 11.3 (L) 12.0 - 15.0 g/dL   HCT 34.2 (L) 36.0 - 46.0 %   MCV 87.0 78.0 - 100.0 fL   MCH 28.8 26.0 - 34.0 pg   MCHC 33.0 30.0 -  36.0 g/dL   RDW 14.1 11.5 - 15.5 %   Platelets 148 (L) 150 - 400 K/uL  Type and screen [place]   Collection Time: 09/21/18  5:25 PM  Result Value Ref Range   ABO/RH(D) A POS    Antibody Screen PENDING    Sample  Expiration      09/24/2018 Performed at [facility], [address], [place], [place] [address]     Patient Active Problem List   Diagnosis Date Noted  . Decreased fetal movements 09/08/2018  . Preterm premature rupture of membranes (PPROM) with unknown onset of labor 08/13/2018  . 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 Supervision of other normal pregnancy, antepartum 03/12/2018  . Thyroid disease 03/12/2018  . Bacterial vaginitis 02/03/2018  . Adjustment disorder with mixed anxiety and depressed mood 04/29/2016  . GERD (gastroesophageal reflux disease) 09/21/2015  . Eczema 10/04/2014  . Asthma 06/12/2012   MDM LR bolus IV  CBC- elevated WBC to 15.9 BPP 8/8  Extended fetal monitoring   [name] Mfm Fetal Bpp Wo Non Stress  Result Date: 09/21/2018 ----------------------------------------------------------------------  OBSTETRICS REPORT                       (Signed Final 09/21/2018 05:20 pm) ---------------------------------------------------------------------- Patient Info  ID #:       [id number]                          D.O.B.:  1996-01-09 (21 yrs)  Name:       Angela Branch                Visit Date: 09/21/2018 05:14 pm ---------------------------------------------------------------------- Performed By  Performed By:     Rodrigo Ran BS      Ref. Address:     [facility]                                                             [facility]                                                             [place], [place]  Attending:  Tama High MD        Secondary Phy.:   MAU Nursing-                                                             MAU/Triage  Referred By:      [facility]       Location:         [facility] for                    [place] ---------------------------------------------------------------------- Orders   #  Description                          Code         Ordered By   1  [name] MFM FETAL BPP WO NON              95638.75     Darrol Poke      STRESS  ----------------------------------------------------------------------   #  Order #                    Accession #                 Episode #   1  [id number]                  [id number]                  [id number]  ---------------------------------------------------------------------- Indications   Maternal care for fetal tachycardia during     O36.8390   pregnancy   [redacted] weeks gestation of pregnancy                Z3A.37  ---------------------------------------------------------------------- Vital Signs                                                 Height:        5'3" ---------------------------------------------------------------------- Fetal Evaluation  Num Of Fetuses:         1  Fetal Heart Rate(bpm):  184  Cardiac Activity:       Tachycardia  Presentation:           Cephalic  Amniotic Fluid  AFI FV:      Within normal limits  AFI Sum(cm)     %Tile       Largest Pocket(cm)  12.26           42          4.76  RUQ(cm)       RLQ(cm)       LUQ(cm)        LLQ(cm)  2.41          4.76          2.07           3.02 ---------------------------------------------------------------------- Biophysical Evaluation  Amniotic F.V:   Within normal  limits       F. Tone:        Observed  F. Movement:    Observed                   Score:          8/8  F. Breathing:   Observed ---------------------------------------------------------------------- OB History  Gravidity:    3         Term:   1          SAB:   1  Living:       1 ---------------------------------------------------------------------- Gestational Age  LMP:           37w 5d        Date:  12/31/17                 EDD:   10/07/18  Best:          37w 5d     Det. By:  LMP  (12/31/17)          EDD:   10/07/18 ---------------------------------------------------------------------- Impression  Patient is being evaluated at the MAU for suspected labor.  She has low-grade fever.  Amniotic fluid is normal and good fetal activity is seen.  Antenatal testing is reassuring. BPP 8/8. Fetal tachycardia  with heart rates 170 to 180s are seen. ---------------------------------------------------------------------- Recommendations  If no clear cause of fever could be determined, intraamniotic  infection should be suspected and delivery is recommended. ----------------------------------------------------------------------                  Tama High, MD Electronically Signed Final Report   09/21/2018 05:20 pm ----------------------------------------------------------------------   Assessment: Angela Branch is a 22 y.o. G3P1011 at [redacted]w[redacted]d admitted to [facility] high risk due to continued fetal tachycardia and sustained low grade fever, chorio suspected with tachycardia and fever w/o unknown cause  Consult with Dr Rosana Hoes on assessment and management, recommends admission to antenatal for 24 hour observation and continuous monitoring. Orders placed for admission. Care taken over by Dr Rosana Hoes.    Lajean Manes, CNM 09/21/2018, 6:04 PM

## 2018-09-22 ENCOUNTER — Inpatient Hospital Stay (HOSPITAL_COMMUNITY): Payer: Medicaid Other | Admitting: Anesthesiology

## 2018-09-22 ENCOUNTER — Encounter (HOSPITAL_COMMUNITY): Payer: Self-pay

## 2018-09-22 DIAGNOSIS — J45909 Unspecified asthma, uncomplicated: Secondary | ICD-10-CM | POA: Diagnosis present

## 2018-09-22 DIAGNOSIS — O9902 Anemia complicating childbirth: Secondary | ICD-10-CM | POA: Diagnosis present

## 2018-09-22 DIAGNOSIS — O4290 Premature rupture of membranes, unspecified as to length of time between rupture and onset of labor, unspecified weeks of gestation: Secondary | ICD-10-CM | POA: Diagnosis present

## 2018-09-22 DIAGNOSIS — O411230 Chorioamnionitis, third trimester, not applicable or unspecified: Secondary | ICD-10-CM | POA: Diagnosis not present

## 2018-09-22 DIAGNOSIS — Z3483 Encounter for supervision of other normal pregnancy, third trimester: Secondary | ICD-10-CM | POA: Diagnosis present

## 2018-09-22 DIAGNOSIS — D649 Anemia, unspecified: Secondary | ICD-10-CM | POA: Diagnosis present

## 2018-09-22 DIAGNOSIS — O9832 Other infections with a predominantly sexual mode of transmission complicating childbirth: Secondary | ICD-10-CM | POA: Diagnosis present

## 2018-09-22 DIAGNOSIS — Z3A37 37 weeks gestation of pregnancy: Secondary | ICD-10-CM | POA: Diagnosis not present

## 2018-09-22 DIAGNOSIS — A6000 Herpesviral infection of urogenital system, unspecified: Secondary | ICD-10-CM | POA: Diagnosis present

## 2018-09-22 DIAGNOSIS — O9952 Diseases of the respiratory system complicating childbirth: Secondary | ICD-10-CM | POA: Diagnosis present

## 2018-09-22 LAB — RPR: RPR Ser Ql: NONREACTIVE

## 2018-09-22 MED ORDER — BENZOCAINE-MENTHOL 20-0.5 % EX AERO: 1.0000 "application " | INHALATION_SPRAY | CUTANEOUS | Status: DC | PRN

## 2018-09-22 MED ORDER — ONDANSETRON HCL 4 MG PO TABS: 4.0000 mg | ORAL_TABLET | ORAL | Status: DC | PRN

## 2018-09-22 MED ORDER — ONDANSETRON HCL 4 MG/2ML IJ SOLN: 4.0000 mg | INTRAMUSCULAR | Status: DC | PRN

## 2018-09-22 MED ORDER — ACETAMINOPHEN 325 MG PO TABS: 650.0000 mg | ORAL_TABLET | ORAL | Status: DC | PRN

## 2018-09-22 MED ORDER — DIBUCAINE 1 % RE OINT: 1.0000 "application " | TOPICAL_OINTMENT | RECTAL | Status: DC | PRN

## 2018-09-22 MED ORDER — DIPHENHYDRAMINE HCL 50 MG/ML IJ SOLN: 12.5000 mg | INTRAMUSCULAR | Status: DC | PRN

## 2018-09-22 MED ORDER — SODIUM CHLORIDE 0.9% FLUSH: 3.0000 mL | Freq: Two times a day (BID) | INTRAVENOUS | Status: DC

## 2018-09-22 MED ORDER — PHENYLEPHRINE 40 MCG/ML (10ML) SYRINGE FOR IV PUSH (FOR BLOOD PRESSURE SUPPORT)
80.0000 ug | PREFILLED_SYRINGE | INTRAVENOUS | Status: DC | PRN
  Filled 2018-09-22: qty 5

## 2018-09-22 MED ORDER — LIDOCAINE HCL (PF) 1 % IJ SOLN
30.0000 mL | INTRAMUSCULAR | Status: DC | PRN
  Filled 2018-09-22: qty 30

## 2018-09-22 MED ORDER — PHENYLEPHRINE 40 MCG/ML (10ML) SYRINGE FOR IV PUSH (FOR BLOOD PRESSURE SUPPORT)
PREFILLED_SYRINGE | INTRAVENOUS | Status: AC
  Filled 2018-09-22: qty 10

## 2018-09-22 MED ORDER — LACTATED RINGERS IV SOLN: INTRAVENOUS | Status: DC

## 2018-09-22 MED ORDER — FENTANYL CITRATE (PF) 100 MCG/2ML IJ SOLN
INTRAMUSCULAR | Status: AC
  Administered 2018-09-22: 07:00:00
  Filled 2018-09-22: qty 2

## 2018-09-22 MED ORDER — SENNOSIDES-DOCUSATE SODIUM 8.6-50 MG PO TABS
2.0000 | ORAL_TABLET | ORAL | Status: DC
  Filled 2018-09-22: qty 2

## 2018-09-22 MED ORDER — PRENATAL MULTIVITAMIN CH: 1.0000 | ORAL_TABLET | Freq: Every day | ORAL | Status: DC

## 2018-09-22 MED ORDER — VALACYCLOVIR HCL 500 MG PO TABS
1000.0000 mg | ORAL_TABLET | Freq: Two times a day (BID) | ORAL | Status: DC
  Filled 2018-09-22 (×5): qty 2

## 2018-09-22 MED ORDER — EPHEDRINE 5 MG/ML INJ
10.0000 mg | INTRAVENOUS | Status: DC | PRN
  Filled 2018-09-22: qty 2

## 2018-09-22 MED ORDER — DIPHENHYDRAMINE HCL 25 MG PO CAPS: 25.0000 mg | ORAL_CAPSULE | Freq: Four times a day (QID) | ORAL | Status: DC | PRN

## 2018-09-22 MED ORDER — ONDANSETRON HCL 4 MG/2ML IJ SOLN: 4.0000 mg | Freq: Four times a day (QID) | INTRAMUSCULAR | Status: DC | PRN

## 2018-09-22 MED ORDER — ZOLPIDEM TARTRATE 5 MG PO TABS: 5.0000 mg | ORAL_TABLET | Freq: Every evening | ORAL | Status: DC | PRN

## 2018-09-22 MED ORDER — OXYTOCIN 40 UNITS IN LACTATED RINGERS INFUSION - SIMPLE MED: 2.5000 [IU]/h | INTRAVENOUS | Status: DC

## 2018-09-22 MED ORDER — LACTATED RINGERS IV SOLN: 500.0000 mL | Freq: Once | INTRAVENOUS | Status: DC

## 2018-09-22 MED ORDER — MEASLES, MUMPS & RUBELLA VAC ~~LOC~~ INJ
0.5000 mL | INJECTION | Freq: Once | SUBCUTANEOUS | Status: DC
  Filled 2018-09-22: qty 0.5

## 2018-09-22 MED ORDER — COCONUT OIL OIL: 1.0000 "application " | TOPICAL_OIL | Status: DC | PRN

## 2018-09-22 MED ORDER — FENTANYL 2.5 MCG/ML BUPIVACAINE 1/10 % EPIDURAL INFUSION (WH - ANES)
INTRAMUSCULAR | Status: AC
  Filled 2018-09-22: qty 100

## 2018-09-22 MED ORDER — OXYTOCIN BOLUS FROM INFUSION
500.0000 mL | Freq: Once | INTRAVENOUS | Status: AC
  Administered 2018-09-22: 500 mL via INTRAVENOUS

## 2018-09-22 MED ORDER — LIDOCAINE HCL (PF) 1 % IJ SOLN
INTRAMUSCULAR | Status: DC | PRN
  Administered 2018-09-22 (×2): 5 mL via EPIDURAL

## 2018-09-22 MED ORDER — TETANUS-DIPHTH-ACELL PERTUSSIS 5-2.5-18.5 LF-MCG/0.5 IM SUSP: 0.5000 mL | Freq: Once | INTRAMUSCULAR | Status: DC

## 2018-09-22 MED ORDER — WITCH HAZEL-GLYCERIN EX PADS: 1.0000 "application " | MEDICATED_PAD | CUTANEOUS | Status: DC | PRN

## 2018-09-22 MED ORDER — LACTATED RINGERS IV SOLN: 500.0000 mL | INTRAVENOUS | Status: DC | PRN

## 2018-09-22 MED ORDER — SIMETHICONE 80 MG PO CHEW: 80.0000 mg | CHEWABLE_TABLET | ORAL | Status: DC | PRN

## 2018-09-22 MED ORDER — IBUPROFEN 600 MG PO TABS
600.0000 mg | ORAL_TABLET | Freq: Four times a day (QID) | ORAL | Status: DC
  Filled 2018-09-22 (×2): qty 1

## 2018-09-22 MED ORDER — SOD CITRATE-CITRIC ACID 500-334 MG/5ML PO SOLN: 30.0000 mL | ORAL | Status: DC | PRN

## 2018-09-22 MED ORDER — OXYCODONE-ACETAMINOPHEN 5-325 MG PO TABS: 1.0000 | ORAL_TABLET | ORAL | Status: DC | PRN

## 2018-09-22 MED ORDER — OXYTOCIN 40 UNITS IN LACTATED RINGERS INFUSION - SIMPLE MED
1.0000 m[IU]/min | INTRAVENOUS | Status: DC
  Administered 2018-09-22: 2 m[IU]/min via INTRAVENOUS
  Filled 2018-09-22: qty 1000

## 2018-09-22 MED ORDER — LIDOCAINE-EPINEPHRINE (PF) 2 %-1:200000 IJ SOLN
INTRAMUSCULAR | Status: DC | PRN
  Administered 2018-09-22: 8 mL via EPIDURAL

## 2018-09-22 MED ORDER — TERBUTALINE SULFATE 1 MG/ML IJ SOLN
0.2500 mg | Freq: Once | INTRAMUSCULAR | Status: DC | PRN
  Filled 2018-09-22: qty 1

## 2018-09-22 MED ORDER — SODIUM CHLORIDE 0.9% FLUSH: 3.0000 mL | INTRAVENOUS | Status: DC | PRN

## 2018-09-22 MED ORDER — OXYCODONE-ACETAMINOPHEN 5-325 MG PO TABS: 2.0000 | ORAL_TABLET | ORAL | Status: DC | PRN

## 2018-09-22 MED ORDER — FENTANYL 2.5 MCG/ML BUPIVACAINE 1/10 % EPIDURAL INFUSION (WH - ANES)
14.0000 mL/h | INTRAMUSCULAR | Status: DC | PRN
  Administered 2018-09-22 (×2): 14 mL/h via EPIDURAL
  Filled 2018-09-22: qty 100

## 2018-09-22 MED ORDER — FENTANYL CITRATE (PF) 100 MCG/2ML IJ SOLN
INTRAMUSCULAR | Status: DC | PRN
  Administered 2018-09-22: 100 ug via EPIDURAL

## 2018-09-22 MED ORDER — SODIUM CHLORIDE 0.9 % IV SOLN: 250.0000 mL | INTRAVENOUS | Status: DC | PRN

## 2018-09-22 NOTE — Progress Notes (Signed)
OB/GYN Faculty Practice: Labor Progress Note  Subjective: Epidural in place, much more comfortable. Frustrated not dilating on own.   Objective: BP 105/70   Pulse (!) 106   Temp 99 F (37.2 C) (Oral)   Resp 16   Ht $R'5\' 2"'SH$  (1.575 m)   Wt 64.4 kg   LMP 12/31/2017   SpO2 100%   BMI 25.97 kg/m  Gen: well-appearing, uncomfortable with contractions Dilation: 3 Effacement (%): 80 Cervical Position: Middle Station: -3, -2 Presentation: Vertex Exam by:: Dr Juleen China  Assessment and Plan: 22 y.o. H4K3524 [redacted]w[redacted]d here for SROM, transferred from antepartum where had been in observation for fetal tachycardia and maternal tachycardia with elevated temp. Mother denies any recent illness, no focal signs of infection.   Labor: Latent labor, will augment with pitocin since no change in SVE. 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 Cephalic by ultrasound.  -- Category I - continuous fetal monitoring  -- GBS negative   Darlena Koval S. Juleen China, DO OB/GYN Fellow, Faculty Practice  4:26 AM

## 2018-09-22 NOTE — Progress Notes (Signed)
Patient ID: Angela Branch, female   DOB: 1995/12/26, 22 y.o.   MRN: [id number]  Patient comfortable. BP 119/64   Pulse (!) 125   Temp 98.3 F (36.8 C) (Oral)   Resp 16   Ht $R'5\' 2"'Kh$  (1.575 m)   Wt 64.4 kg   LMP 12/31/2017   SpO2 100%   BMI 25.97 kg/m   FHT: 145, mod variability, 10x10 accel, variable decel.  Arom Forebag - clear fluid  Dilation: 6 Effacement (%): 90 Cervical Position: Middle Station: -1 Presentation: Vertex Exam by:: Jailon Schaible, DO   IUPC placed without difficulty. Expectant management  Truett Mainland, DO 09/22/2018 9:12 AM

## 2018-09-22 NOTE — Anesthesia Procedure Notes (Signed)
Epidural Patient location during procedure: OB Start time: 09/22/2018 1:56 AM End time: 09/22/2018 1:59 AM  Staffing Anesthesiologist: Lyn Hollingshead, MD Performed: anesthesiologist   Preanesthetic Checklist Completed: patient identified, site marked, surgical consent, pre-op evaluation, timeout performed, IV checked, risks and benefits discussed and monitors and equipment checked  Epidural Patient position: sitting Prep: site prepped and draped and DuraPrep Patient monitoring: continuous pulse ox and blood pressure Approach: midline Location: L3-L4 Injection technique: LOR air  Needle:  Needle type: Tuohy  Needle gauge: 17 G Needle length: 9 cm and 9 Needle insertion depth: 5 cm cm Catheter type: closed end flexible Catheter size: 19 Gauge Catheter at skin depth: 10 cm Test dose: negative and Other  Assessment Sensory level: T9 Events: blood not aspirated, injection not painful, no injection resistance, negative IV test and no paresthesia

## 2018-09-22 NOTE — Progress Notes (Signed)
Faculty Note  Up to see patient at her request, she states her contractions are much worse, pain is significantly worse.   Of note, patient reports she has been taking valtrex regularly, denies any lesions or prodromal symptoms  SSE: pooling in vagina, no lesions noted SVE: 3/60/-2  [place]: positive  A/P: 22 yo G3P1011 @ [redacted]w[redacted]d kept for observation for fetal tachycardia, contracting regularly and now noted to have SROM between 11 pm and 12 am. Will transfer to L&D for early labor, augmentation prn.   Feliz Beam, M.D. [facility] for [employer]

## 2018-09-22 NOTE — Anesthesia Preprocedure Evaluation (Signed)
Anesthesia Evaluation  Patient identified by MRN, date of birth, ID band Patient awake    Reviewed: Allergy & Precautions, H&P , NPO status , Patient's Chart, lab work & pertinent test results  Airway Mallampati: II  TM Distance: >3 FB Neck ROM: full    Dental no notable dental hx.    Pulmonary    Pulmonary exam normal breath sounds clear to auscultation       Cardiovascular negative cardio ROS   Rhythm:regular Rate:Normal     Neuro/Psych negative neurological ROS  negative psych ROS   GI/Hepatic negative GI ROS, Neg liver ROS,   Endo/Other  negative endocrine ROS  Renal/GU negative Renal ROS     Musculoskeletal negative musculoskeletal ROS (+)   Abdominal   Peds  Hematology  (+) anemia ,   Anesthesia Other Findings   Reproductive/Obstetrics (+) Pregnancy                             Anesthesia Physical Anesthesia Plan  ASA: II  Anesthesia Plan: Epidural   Post-op Pain Management:    Induction:   PONV Risk Score and Plan:   Airway Management Planned:   Additional Equipment:   Intra-op Plan:   Post-operative Plan:   Informed Consent: I have reviewed the patients History and Physical, chart, labs and discussed the procedure including the risks, benefits and alternatives for the proposed anesthesia with the patient or authorized representative who has indicated his/her understanding and acceptance.     Plan Discussed with:   Anesthesia Plan Comments:         Anesthesia Quick Evaluation

## 2018-09-22 NOTE — Anesthesia Postprocedure Evaluation (Signed)
Anesthesia Post Note  Patient: Angela Branch  Procedure(s) Performed: AN AD HOC LABOR EPIDURAL     Patient location during evaluation: Mother Baby Anesthesia Type: Epidural Level of consciousness: awake and alert Pain management: pain level controlled Vital Signs Assessment: post-procedure vital signs reviewed and stable Respiratory status: spontaneous breathing, nonlabored ventilation and respiratory function stable Cardiovascular status: stable Postop Assessment: no headache, no backache and epidural receding Anesthetic complications: no    Last Vitals:  Vitals:   09/22/18 1315 09/22/18 1410  BP: 109/78 (!) 104/55  Pulse: 98 83  Resp: 18 18  Temp: 37.1 C 37.1 C  SpO2: 100% 100%    Last Pain:  Vitals:   09/22/18 1410  TempSrc: Oral  PainSc: 0-No pain   Pain Goal: Patients Stated Pain Goal: 3 (09/21/18 2207)               Nonah Mattes N

## 2018-09-22 NOTE — Addendum Note (Signed)
Addendum  created 09/22/18 1524 by Sandrea Matte, CRNA   Charge Capture section accepted, Sign clinical note

## 2018-09-22 NOTE — Progress Notes (Signed)
OB/GYN Faculty Practice: Labor Progress Note  Subjective: Feeling much more uncomfortable. Doesn't have any questions or concerns.   Objective: BP 112/67   Pulse (!) 114   Temp 99 F (37.2 C) (Oral)   Resp 16   Ht $R'5\' 2"'yX$  (1.575 m)   Wt 64.4 kg   LMP 12/31/2017   SpO2 100%   BMI 25.97 kg/m  Gen: well-appearing, uncomfortable with contractions Dilation: 3 Effacement (%): 80 Cervical Position: Middle Station: -2 Presentation: Vertex Exam by:: Dr. Rosana Hoes  Assessment and Plan: 22 y.o. S4N9914 [redacted]w[redacted]d here for SROM, transferred from antepartum where had been in observation for fetal tachycardia and maternal tachycardia with elevated temp. Mother denies any recent illness, no focal signs of infection.   Labor: Latent labor, expectant management.  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 Cephalic by prior checks. BPP today 8/8 I setting of maternal tachycardia. Elevated temperature but no maternal fever.  -- Category I - continuous fetal monitoring  -- GBS unknown, no history of GBS - will check PCR but defer antibiotics at this time    Roniel Halloran S. Juleen China, DO OB/GYN Fellow, Faculty Practice  1:23 AM

## 2018-09-22 NOTE — Progress Notes (Signed)
Pt refusing to change position it would appear the baby may behaving late decels. Readjusted monitor to attempt to determine if decels are with ucs or after.

## 2018-09-22 NOTE — Progress Notes (Signed)
RN has attempted to do admission paperwork and patient has requested that I not do education at this time because she needs a nap. RN showed her how to use the call bell, instructed her to call before getting OOB. Patient verbalized understanding. Timoteo Ace, RN

## 2018-09-22 NOTE — Progress Notes (Signed)
Mom was set up with a DEBP due to baby being in the NICU. Mom's admission plan is to just pump and BO feed. Mom verbalized understanding of the equipment, cleaning, and timing of pumping. Timoteo Ace, RN

## 2018-09-22 NOTE — Anesthesia Postprocedure Evaluation (Signed)
Anesthesia Post Note  Patient: [name]  Procedure(s) Performed: AN AD HOC LABOR EPIDURAL     Patient location during evaluation: Mother Baby Anesthesia Type: Epidural Level of consciousness: awake and alert Pain management: pain level controlled Vital Signs Assessment: post-procedure vital signs reviewed and stable Respiratory status: spontaneous breathing, nonlabored ventilation and respiratory function stable Cardiovascular status: stable Postop Assessment: no headache, no backache and epidural receding Anesthetic complications: no    Last Vitals:  Vitals:   09/22/18 0816 09/22/18 1001  BP: 119/64 109/64  Pulse: (!) 125 (!) 116  Resp: 18 18  Temp:  37.8 C  SpO2:      Last Pain:  Vitals:   09/22/18 0712  TempSrc:   PainSc: 5    Pain Goal: Patients Stated Pain Goal: 3 (09/21/18 2207)               Valaria Kohut

## 2018-09-23 ENCOUNTER — Encounter: Payer: Self-pay | Admitting: Obstetrics & Gynecology

## 2018-09-23 MED ORDER — OXYCODONE-ACETAMINOPHEN 5-325 MG PO TABS
1.0000 | ORAL_TABLET | Freq: Once | ORAL | Status: AC
  Administered 2018-09-23: 1 via ORAL
  Filled 2018-09-23: qty 1

## 2018-09-23 NOTE — Progress Notes (Signed)
Patient has been in NICU most of the night. She did not take any of her evening medications. Nurse attempted to give medication when patient returned to the floor, but patient asleep. Nurse attempted VS this morning, and patient once again off the floor in the NICU. Areeba Sulser D. Ferol Luz, RN 09/23/2018 6:25 AM

## 2018-09-23 NOTE — Lactation Note (Signed)
This note was copied from a baby's chart. Lactation Consultation Note  Patient Name: Girl Letonia Stead MZTAE'W Date: 09/23/2018 Reason for consult: Initial assessment;NICU baby;Early term 37-38.6wks   Initial consult with mom of 20 hour old NICU infant. Have spoken with Raelyn Ensign, NNP in NICU earlier and she wanted to encourage mom to come up and BF infant at feeding time as they are attempting to wean infant off IV fluids.   Mom reports pumping is coming and going. She has a DEBP set up at her bedside. She would not elaborate on how often she is pumping. Enc mom to go to NICU for feedings to BF infant as she did last night, she reports she has given specific instructions that infant should receive formula if she is hungry. Asked mom is she has been informed of risks of formula and she reports she does not need to know and  that formula is a good alternative.   Mom was resistant to any teaching, told mom we could come back later. Mom reports she is tired and has not gotten any sleep. LC chose to leave the room at this time. Enc mom to call out for assistance as needed. Lactation Brochures and Providing Milk for Your Baby in NICU left in the room, they were not reviewed with mom.    Maternal Data Formula Feeding for Exclusion: No  Feeding    LATCH Score                   Interventions    Lactation Tools Discussed/Used Pump Review: Setup, frequency, and cleaning;Milk Storage Initiated by:: Encouraged every 2-3 hours   Consult Status Consult Status: Follow-up Date: 09/24/18 Follow-up type: In-patient    Debby Freiberg Hice 09/23/2018, 9:51 AM

## 2018-09-23 NOTE — Progress Notes (Signed)
Patient complaining of cramping pain of 4 out of 10. Offered patient tylenol and her scheduled ibuprofen together; however, she states that those will not work for her. Called Hansel Feinstein, CNM, who ordered one dose of percocet now. Lelan Pons will come see patient for rounds. Maxwell Caul, Leretha Dykes [place]

## 2018-09-23 NOTE — Progress Notes (Signed)
Post Partum Day 1 Subjective: up ad lib, voiding, tolerating PO and Complaining of severe cramping when pumping  Objective: Blood pressure 104/68, pulse 88, temperature 98.5 F (36.9 C), temperature source Oral, resp. rate 16, height $RemoveBe'5\' 2"'mexomBoWM$  (1.575 m), weight 64.4 kg, last menstrual period 12/31/2017, SpO2 100 %, unknown if currently breastfeeding.  Physical Exam:  General: alert, cooperative and no distress Lochia: appropriate Uterine Fundus: firm Incision: n/a DVT Evaluation: No evidence of DVT seen on physical exam.  Recent Labs    09/21/18 1725  HGB 11.3*  HCT 34.2*    Assessment/Plan: Plan for discharge tomorrow and Breastfeeding  Baby in NICU One dose of Percocet, refuses Tylenol and ibuprofen   LOS: 1 day   Angela Branch 09/23/2018, 7:52 AM

## 2018-09-23 NOTE — Plan of Care (Signed)
  Problem: Life Cycle: Goal: Chance of risk for complications during the postpartum period will decrease Note:  Provided patient with Kpad to help with cramps while pumping. Patient states that Kpad is decreasing her pain and continues to decline her ibuprofen and tylenol. Maxwell Caul, Leretha Dykes [place]

## 2018-09-24 ENCOUNTER — Ambulatory Visit: Payer: Self-pay

## 2018-09-24 MED ORDER — IBUPROFEN 600 MG PO TABS: 600.0000 mg | ORAL_TABLET | Freq: Four times a day (QID) | ORAL | 0 refills | Status: DC

## 2018-09-24 NOTE — Lactation Note (Signed)
This note was copied from a baby's chart. Lactation Consultation Note  Patient Name: Angela Branch [id number] Date: 09/24/2018   [facility] Follow Up Visit:  Mother has been discharged and baby is in the NICU.  According to the NICU RN mother left approximately an hour ago to go buy a DEBP and has not returned yet.  It appears that mother will be breast/bottle feeding at home.  I offered my assistance if needed and RN will call me if mother would like my assistance when she returns.      Yuko Coventry R Aundre Hietala 09/24/2018, 12:18 PM

## 2018-09-24 NOTE — Discharge Summary (Signed)
Postpartum Discharge Summary     Patient Name: Angela Branch DOB: 06-18-1996 MRN: [id number]  Date of admission: 09/21/2018 Delivering Provider: Nicolette Bang   Date of discharge: 09/24/2018  Admitting diagnosis: CTX Intrauterine pregnancy: [redacted]w[redacted]d     Secondary diagnosis:  Active Problems:   Fetal tachycardia affecting management of mother   Delayed delivery after SROM (spontaneous rupture of membranes)   SVD (spontaneous vaginal delivery)  Additional problems: thyroid disease, HSV infection     Discharge diagnosis: Term Pregnancy Delivered                                                                                                Post partum procedures:none  Augmentation: Pitocin  Complications: poor tone, transferred to [facility] course:  Onset of Labor With Vaginal Delivery     22 y.o. yo [id number] at [redacted]w[redacted]d was admitted in Latent Labor on 09/21/2018. Patient had an uncomplicated labor course as follows:  Membrane Rupture Time/Date: 9:06 AM ,09/22/2018   Intrapartum Procedures: Episiotomy: None [1]                                         Lacerations:  None [1]  Patient had a delivery of a Viable infant. 09/22/2018  Information for the patient's newborn:  Kassey, [place]forest [[id number]]  Delivery Method: Vaginal, Spontaneous(Filed from Delivery Summary)    Pateint had an uncomplicated postpartum course.  She is ambulating, tolerating a regular diet, passing flatus, and urinating well. Patient is discharged home in stable condition on 09/24/18.   Magnesium Sulfate recieved: No BMZ received: No  Physical exam  Vitals:   09/22/18 2157 09/23/18 1444 09/23/18 2226 09/24/18 0551  BP: 104/68 100/74 101/68 (!) 88/53  Pulse: 88 (!) 55 (!) 59 (!) 53  Resp: $Remo'16 17 16   'jmXmO$ Temp: 98.5 F (36.9 C) 97.6 F (36.4 C) 97.9 F (36.6 C) 97.9 F (36.6 C)  TempSrc: Oral Oral Oral Oral  SpO2:   100%   Weight:      Height:       General: alert, cooperative and no  distress Lochia: appropriate Uterine Fundus: firm Incision: N/A DVT Evaluation: No evidence of DVT seen on physical exam. No cords or calf tenderness. No significant calf/ankle edema. Labs: Lab Results  Component Value Date   WBC 15.9 (H) 09/21/2018   HGB 11.3 (L) 09/21/2018   HCT 34.2 (L) 09/21/2018   MCV 87.0 09/21/2018   PLT 148 (L) 09/21/2018   CMP Latest Ref Rng & Units 04/24/2018  Glucose 65 - 99 mg/dL 75  BUN 6 - 20 mg/dL 12  Creatinine 0.44 - 1.00 mg/dL 0.57  Sodium 135 - 145 mmol/L 135  Potassium 3.5 - 5.1 mmol/L 3.6  Chloride 101 - 111 mmol/L 103  CO2 22 - 32 mmol/L 20(L)  Calcium 8.9 - 10.3 mg/dL 8.6(L)  Total Protein 6.5 - 8.1 g/dL 6.4(L)  Total Bilirubin 0.3 - 1.2 mg/dL 0.8  Alkaline Phos 38 - 126 U/L 31(L)  AST 15 - 41 U/L 24  ALT 14 - 54 U/L 13(L)    Discharge instruction: per After Visit Summary and "Baby and Me Booklet".  After visit meds:  Allergies as of 09/24/2018      Reactions   Azithromycin    Diarrhea, nausea/vomiting, IV burns vein      Medication List    STOP taking these medications   metroNIDAZOLE 0.75 % vaginal gel Commonly known as:  METROGEL     TAKE these medications   albuterol 108 (90 Base) MCG/ACT inhaler Commonly known as:  PROVENTIL HFA;VENTOLIN HFA Inhale 2 puffs into the lungs every 6 (six) hours as needed for wheezing or shortness of breath.   ibuprofen 600 MG tablet Commonly known as:  ADVIL,MOTRIN Take 1 tablet (600 mg total) by mouth every 6 (six) hours.   valACYclovir 1000 MG tablet Commonly known as:  VALTREX Take 1 tablet (1,000 mg total) by mouth 2 (two) times daily.       Diet: routine diet  Activity: Advance as tolerated. Pelvic rest for 6 weeks.   Outpatient follow up:4 weeks Follow up Appt:No future appointments. Follow up Visit:No follow-ups on file.   Please schedule this patient for Postpartum visit in: 4 weeks with the following provider: MD For C/S patients schedule nurse incision check in  weeks 2 weeks: no Low risk pregnancy complicated by: thyroid disease, HSV Delivery mode:  SVD Anticipated Birth Control:  IUD PP Procedures needed: none  Schedule Integrated BH visit: no      Newborn Data: Live born female  Birth Weight: 6 lb 6.3 oz (2900 g) APGAR: 4, 8  Newborn Delivery   Birth date/time:  09/22/2018 10:20:00 Delivery type:  Vaginal, Spontaneous     Baby Feeding: Breast Disposition:NICU   09/24/2018 Caroline More, DO  PGY-2

## 2018-09-24 NOTE — Discharge Instructions (Signed)

## 2018-09-30 DIAGNOSIS — R7989 Other specified abnormal findings of blood chemistry: Secondary | ICD-10-CM | POA: Diagnosis not present

## 2018-10-01 ENCOUNTER — Encounter: Payer: Self-pay | Admitting: Obstetrics & Gynecology

## 2018-10-07 ENCOUNTER — Encounter: Payer: Self-pay | Admitting: Obstetrics & Gynecology

## 2018-10-11 ENCOUNTER — Emergency Department (HOSPITAL_COMMUNITY): Payer: Medicaid Other

## 2018-10-11 ENCOUNTER — Emergency Department (HOSPITAL_COMMUNITY)
Admission: EM | Admit: 2018-10-11 | Discharge: 2018-10-11 | Discharge status: Against Medical Advice | Payer: Medicaid Other | Attending: Emergency Medicine | Admitting: Emergency Medicine

## 2018-10-11 ENCOUNTER — Encounter (HOSPITAL_COMMUNITY): Payer: Self-pay | Admitting: *Deleted

## 2018-10-11 ENCOUNTER — Other Ambulatory Visit: Payer: Self-pay

## 2018-10-11 DIAGNOSIS — J45909 Unspecified asthma, uncomplicated: Secondary | ICD-10-CM | POA: Insufficient documentation

## 2018-10-11 DIAGNOSIS — Y92410 Unspecified street and highway as the place of occurrence of the external cause: Secondary | ICD-10-CM | POA: Insufficient documentation

## 2018-10-11 DIAGNOSIS — M79602 Pain in left arm: Secondary | ICD-10-CM | POA: Insufficient documentation

## 2018-10-11 DIAGNOSIS — Y999 Unspecified external cause status: Secondary | ICD-10-CM | POA: Diagnosis not present

## 2018-10-11 DIAGNOSIS — M542 Cervicalgia: Secondary | ICD-10-CM | POA: Insufficient documentation

## 2018-10-11 DIAGNOSIS — M62830 Muscle spasm of back: Secondary | ICD-10-CM | POA: Diagnosis not present

## 2018-10-11 DIAGNOSIS — R51 Headache: Secondary | ICD-10-CM | POA: Insufficient documentation

## 2018-10-11 DIAGNOSIS — Y9389 Activity, other specified: Secondary | ICD-10-CM | POA: Insufficient documentation

## 2018-10-11 DIAGNOSIS — Z5329 Procedure and treatment not carried out because of patient's decision for other reasons: Secondary | ICD-10-CM | POA: Insufficient documentation

## 2018-10-11 DIAGNOSIS — Z79899 Other long term (current) drug therapy: Secondary | ICD-10-CM | POA: Diagnosis not present

## 2018-10-11 DIAGNOSIS — M79605 Pain in left leg: Secondary | ICD-10-CM | POA: Diagnosis not present

## 2018-10-11 DIAGNOSIS — M549 Dorsalgia, unspecified: Secondary | ICD-10-CM | POA: Insufficient documentation

## 2018-10-11 MED ORDER — IBUPROFEN 200 MG PO TABS: 400.0000 mg | ORAL_TABLET | Freq: Once | ORAL | Status: DC

## 2018-10-11 MED ORDER — CYCLOBENZAPRINE HCL 10 MG PO TABS
5.0000 mg | ORAL_TABLET | Freq: Once | ORAL | Status: AC
  Administered 2018-10-11: 5 mg via ORAL
  Filled 2018-10-11: qty 1
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
## 2018-10-11 NOTE — ED Notes (Signed)
Pt says she cannot stay because her ride is here and she has to leave. I explained to her that the results are not back and d/c has not been complete. She is anxious to leave, signed out AMA.
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
## 2018-10-11 NOTE — ED Notes (Signed)
ED Provider at bedside. 
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
## 2018-11-03 ENCOUNTER — Ambulatory Visit (INDEPENDENT_AMBULATORY_CARE_PROVIDER_SITE_OTHER): Payer: Medicaid Other | Admitting: Advanced Practice Midwife

## 2018-11-03 ENCOUNTER — Other Ambulatory Visit (HOSPITAL_COMMUNITY)
Admission: RE | Admit: 2018-11-03 | Discharge: 2018-11-03 | Disposition: A | Discharge status: Home / Self Care | Payer: Medicaid Other | Source: Ambulatory Visit | Attending: Advanced Practice Midwife | Admitting: Advanced Practice Midwife

## 2018-11-03 ENCOUNTER — Encounter: Payer: Self-pay | Admitting: Advanced Practice Midwife

## 2018-11-03 DIAGNOSIS — Z30011 Encounter for initial prescription of contraceptive pills: Secondary | ICD-10-CM

## 2018-11-03 DIAGNOSIS — Z1389 Encounter for screening for other disorder: Secondary | ICD-10-CM | POA: Diagnosis not present

## 2018-11-03 DIAGNOSIS — Z01419 Encounter for gynecological examination (general) (routine) without abnormal findings: Secondary | ICD-10-CM

## 2018-11-03 LAB — POCT PREGNANCY, URINE: PREG TEST UR: NEGATIVE

## 2018-11-03 MED ORDER — NORETHINDRONE 0.35 MG PO TABS: 1.0000 | ORAL_TABLET | Freq: Every day | ORAL | 11 refills | Status: DC

## 2018-11-03 NOTE — Progress Notes (Signed)
Subjective:     Angela Branch is a 22 y.o. female who presents for a postpartum visit. She is 6 weeks postpartum following a spontaneous vaginal delivery. I have fully reviewed the prenatal and intrapartum course. The delivery was at [redacted]w[redacted]d  gestational weeks. Outcome: spontaneous vaginal delivery. Anesthesia: epidural. Postpartum course has been . Baby's course has been uncomplicated. Baby is feeding by both breast and bottle - Marcos Eke. Pt has been supplementing w/ formula due to low milk supply. Has not seen lactation consultant. Bleeding staining only. Bowel function is abnormal: constipation. Bladder function is normal. Patient is sexually active. Last IC 11/01/18 w/out contraception. Contraception method is planning oral progesterone-only contraceptive. Postpartum depression screening: negative.  The following portions of the patient's history were reviewed and updated as appropriate: allergies, current medications, past family history, past medical history, past social history, past surgical history and problem list.  Has never had a Pap.   Review of Systems Pertinent items are noted in HPI.  /O vaginal discharge. Wants to be tested for BV.   Objective:    BP 110/70   Pulse 69   Wt 132 lb 6.4 oz (60.1 kg)   BMI 24.22 kg/m   General:  alert, cooperative and no distress   Breasts:  inspection negative, no nipple discharge or bleeding, no masses or nodularity palpable  Lungs: clear to auscultation bilaterally  Heart:  regular rate and rhythm, S1, S2 normal, no murmur, click, rub or gallop  Abdomen: soft, non-tender; bowel sounds normal; no masses,  no organomegaly   Vulva:  normal  Vagina: normal vagina and small amount of pink discharge  Cervix:  multiparous appearance, no bleeding following Pap and no cervical motion tenderness  Corpus: normal size, contour, position, consistency, mobility, non-tender  Adnexa:  no mass, fullness, tenderness  Rectal Exam: Not performed.    Assessment:     Nml postpartum exam. Pap smear done at today's visit.   Vaginal discharge  Plan:    1. Contraception: oral progesterone-only contraceptive.  2. Pap, BVag, GC/Chlamydia 3. Follow up in: 1 year or as needed.     Tamala Julian, [place], [place] 11/03/2018 3:45 PM

## 2018-11-03 NOTE — Patient Instructions (Signed)

## 2018-11-04 LAB — CYTOLOGY - PAP
BACTERIAL VAGINITIS: NEGATIVE
CHLAMYDIA, DNA PROBE: NEGATIVE
DIAGNOSIS: NEGATIVE
NEISSERIA GONORRHEA: NEGATIVE

## 2018-12-02 DIAGNOSIS — E0590 Thyrotoxicosis, unspecified without thyrotoxic crisis or storm: Secondary | ICD-10-CM | POA: Diagnosis not present
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
## 2019-02-11 ENCOUNTER — Other Ambulatory Visit: Payer: Self-pay | Admitting: Pediatrics

## 2019-02-11 MED ORDER — OFLOXACIN 0.3 % OP SOLN: 1.0000 [drp] | Freq: Four times a day (QID) | OPHTHALMIC | 3 refills | Status: AC

## 2019-04-16 ENCOUNTER — Telehealth: Payer: Medicaid Other | Admitting: Physician Assistant

## 2019-04-16 DIAGNOSIS — N760 Acute vaginitis: Secondary | ICD-10-CM | POA: Diagnosis not present

## 2019-04-16 DIAGNOSIS — R109 Unspecified abdominal pain: Secondary | ICD-10-CM

## 2019-04-16 DIAGNOSIS — N898 Other specified noninflammatory disorders of vagina: Secondary | ICD-10-CM

## 2019-04-16 NOTE — Progress Notes (Signed)
Based on what you shared with me, I feel your condition warrants further evaluation and I recommend that you be seen for a face to face office visit.     NOTE: If you entered your credit card information for this eVisit, you will not be charged. You may see a "hold" on your card for the $35 but that hold will drop off and you will not have a charge processed.  If you are having a true medical emergency please call 911.  If you need an urgent face to face visit, [facility] has four urgent care centers for your convenience.    The following sites will take your insurance:  . [facility] [place] a Provider at this Location  [address] [place], [place] [address] . 10 am to 8 pm Monday-Friday . 12 pm to 8 pm Saturday-Sunday   . [facility] at [place] a Provider at this Location  [address], [place], [place] [address] . 8 am to 8 pm Monday-Friday . 9 am to 6 pm Saturday . 11 am to 6 pm Sunday   . [facility] at [place] Get Driving Directions  [address] [place], [place] [address] . 8 am to 8 pm Monday-Friday . 8 am to 4 pm Saturday-Sunday   Your e-visit answers were reviewed by a board certified advanced clinical practitioner to complete your personal care plan.  Thank you for using e-Visits.  I have spent 5 minutes in review of e-visit questionnaire, review and updating patient chart, medical decision making and response to patient.    Tenna Delaine, PA-C

## 2019-05-15 ENCOUNTER — Other Ambulatory Visit: Payer: Self-pay

## 2019-05-15 ENCOUNTER — Inpatient Hospital Stay (HOSPITAL_COMMUNITY): Payer: Medicaid Other

## 2019-05-15 ENCOUNTER — Inpatient Hospital Stay (HOSPITAL_COMMUNITY)
Admission: AD | Admit: 2019-05-15 | Discharge: 2019-05-15 | Disposition: A | Discharge status: Home / Self Care | Payer: Medicaid Other | Attending: Obstetrics & Gynecology | Admitting: Obstetrics & Gynecology

## 2019-05-15 ENCOUNTER — Encounter (HOSPITAL_COMMUNITY): Payer: Self-pay

## 2019-05-15 DIAGNOSIS — R109 Unspecified abdominal pain: Secondary | ICD-10-CM | POA: Diagnosis not present

## 2019-05-15 DIAGNOSIS — E079 Disorder of thyroid, unspecified: Secondary | ICD-10-CM | POA: Diagnosis not present

## 2019-05-15 DIAGNOSIS — O26891 Other specified pregnancy related conditions, first trimester: Secondary | ICD-10-CM | POA: Insufficient documentation

## 2019-05-15 DIAGNOSIS — Z79899 Other long term (current) drug therapy: Secondary | ICD-10-CM | POA: Diagnosis not present

## 2019-05-15 DIAGNOSIS — Z3A01 Less than 8 weeks gestation of pregnancy: Secondary | ICD-10-CM | POA: Diagnosis not present

## 2019-05-15 DIAGNOSIS — O3680X0 Pregnancy with inconclusive fetal viability, not applicable or unspecified: Secondary | ICD-10-CM | POA: Insufficient documentation

## 2019-05-15 DIAGNOSIS — O99281 Endocrine, nutritional and metabolic diseases complicating pregnancy, first trimester: Secondary | ICD-10-CM | POA: Diagnosis not present

## 2019-05-15 DIAGNOSIS — Z3A00 Weeks of gestation of pregnancy not specified: Secondary | ICD-10-CM | POA: Diagnosis not present

## 2019-05-15 DIAGNOSIS — O26851 Spotting complicating pregnancy, first trimester: Secondary | ICD-10-CM | POA: Diagnosis not present

## 2019-05-15 LAB — CBC
HCT: 44 % (ref 36.0–46.0)
Hemoglobin: 15 g/dL (ref 12.0–15.0)
MCH: 29.9 pg (ref 26.0–34.0)
MCHC: 34.1 g/dL (ref 30.0–36.0)
MCV: 87.8 fL (ref 80.0–100.0)
Platelets: 200 10*3/uL (ref 150–400)
RBC: 5.01 MIL/uL (ref 3.87–5.11)
RDW: 13 % (ref 11.5–15.5)
WBC: 8.4 10*3/uL (ref 4.0–10.5)
nRBC: 0 % (ref 0.0–0.2)

## 2019-05-15 LAB — COMPREHENSIVE METABOLIC PANEL
ALT: 15 U/L (ref 0–44)
AST: 18 U/L (ref 15–41)
Albumin: 4.6 g/dL (ref 3.5–5.0)
Alkaline Phosphatase: 39 U/L (ref 38–126)
Anion gap: 10 (ref 5–15)
BUN: 11 mg/dL (ref 6–20)
CO2: 22 mmol/L (ref 22–32)
Calcium: 9.3 mg/dL (ref 8.9–10.3)
Chloride: 102 mmol/L (ref 98–111)
Creatinine, Ser: 0.88 mg/dL (ref 0.44–1.00)
GFR calc Af Amer: >60 mL/min (ref >60)
GFR calc non Af Amer: >60 mL/min (ref >60)
Glucose, Bld: 91 mg/dL (ref 70–99)
Potassium: 3.8 mmol/L (ref 3.5–5.1)
Sodium: 134 mmol/L — ABNORMAL LOW (ref 135–145)
Total Bilirubin: 3.3 mg/dL — ABNORMAL HIGH (ref 0.3–1.2)
Total Protein: 7.6 g/dL (ref 6.5–8.1)

## 2019-05-15 LAB — WET PREP, GENITAL
Sperm: NONE SEEN
Trich, Wet Prep: NONE SEEN

## 2019-05-15 LAB — HCG, QUANTITATIVE, PREGNANCY: hCG, Beta Chain, Quant, S: 64 m[IU]/mL — ABNORMAL HIGH (ref <5)

## 2019-05-15 LAB — POCT PREGNANCY, URINE: Preg Test, Ur: POSITIVE — AB

## 2019-05-15 NOTE — MAU Note (Signed)
Angela Branch is a 23 y.o. at [redacted]w[redacted]d here in MAU reporting: cramping and spotting for the past week and a half, the last time she had this was a few days ago. + UPT on Friday. States she wants to make sure everything is okay.  Pain score: 0/10  Vitals:   05/15/19 1503  BP: 110/67  Pulse: 79  Resp: 16  Temp: 98.2 F (36.8 C)  SpO2: 100%      Lab orders placed from triage: UPT

## 2019-05-15 NOTE — MAU Provider Note (Signed)
[name] is a 23 y.o.  [id number] At [redacted]w[redacted]d by uncertain LMP here after having a positive pregnancy test and experiencing some abdominal pain and spotting earlier this week.   She denies NV, HA, chills, SOB, dysuria or other vaginal complaints.  History     CSN: [id number]  Arrival date and time: 05/15/19 1417   First Provider Initiated Contact with Patient 05/15/19 1537      Chief Complaint  Patient presents with  . Possible Pregnancy   Possible Pregnancy  Associated symptoms include abdominal pain and nausea.  Abdominal Pain  This is a new problem. The current episode started in the past 7 days. The problem occurs intermittently. The problem has been resolved. The pain is located in the suprapubic region. The pain is at a severity of 6/10. Associated symptoms include nausea. Pertinent negatives include no constipation, diarrhea or dysuria. Nothing aggravates the pain. The pain is relieved by nothing.  Vaginal Bleeding  The patient's primary symptoms include vaginal bleeding. The patient's pertinent negatives include no genital itching or genital lesions. This is a new problem. The current episode started in the past 7 days. The problem occurs intermittently. The problem has been unchanged. Associated symptoms include abdominal pain and nausea. Pertinent negatives include no constipation, diarrhea or dysuria. The vaginal discharge was bloody. The vaginal bleeding is spotting. She has not been passing clots. She has not been passing tissue.  Patient denies any bleeding or clots today.   OB History    Gravida  4   Para  2   Term  2   Preterm  0   AB  1   Living  2     SAB  1   TAB  0   Ectopic  0   Multiple  0   Live Births  2           Past Medical History:  Diagnosis Date  . Anemia   . Anxiety   . Asthma   . Thyroid disease     Past Surgical History:  Procedure Laterality Date  . TOOTH EXTRACTION      Family History  Problem Relation Age  of Onset  . Asthma Father   . Diabetes Maternal Grandmother   . Hyperlipidemia Paternal Grandfather   . Heart disease Paternal Grandfather   . Cancer - Colon Maternal Grandfather   . Mental illness Neg Hx   . Birth defects Neg Hx   . Kidney disease Neg Hx   . Hypertension Neg Hx     Social History   Tobacco Use  . Smoking status: Never Smoker  . Smokeless tobacco: Never Used  Substance Use Topics  . Alcohol use: No    Alcohol/week: 0.0 standard drinks  . Drug use: No    Allergies:  Allergies  Allergen Reactions  . Azithromycin     Diarrhea, nausea/vomiting, IV burns vein    Medications Prior to Admission  Medication Sig Dispense Refill Last Dose  . albuterol (PROVENTIL HFA;VENTOLIN HFA) 108 (90 Base) MCG/ACT inhaler Inhale 2 puffs into the lungs every 6 (six) hours as needed for wheezing or shortness of breath.   Taking  . ibuprofen (ADVIL,MOTRIN) 600 MG tablet Take 1 tablet (600 mg total) by mouth every 6 (six) hours. (Patient not taking: Reported on 11/03/2018) 30 tablet 0 Not Taking  . norethindrone (MICRONOR,CAMILA,ERRIN) 0.35 MG tablet Take 1 tablet (0.35 mg total) by mouth daily. 1 Package 11   . valACYclovir (VALTREX) 1000 MG  tablet Take 1 tablet (1,000 mg total) by mouth 2 (two) times daily. (Patient not taking: Reported on 11/03/2018) 60 tablet 1 Not Taking    Review of Systems  Constitutional: Negative.   HENT: Negative.   Respiratory: Negative.   Gastrointestinal: Positive for abdominal pain and nausea. Negative for constipation and diarrhea.  Genitourinary: Positive for vaginal bleeding. Negative for dysuria.  Musculoskeletal: Negative.   Neurological: Negative.    Physical Exam   Blood pressure 111/61, pulse 72, temperature 98.2 F (36.8 C), temperature source Oral, resp. rate 16, last menstrual period 04/12/2019, SpO2 100 %, unknown if currently breastfeeding.  Physical Exam  Constitutional: She appears well-developed.  HENT:  Head: Normocephalic.   Neck: Normal range of motion.  Respiratory: Effort normal.  GI: Soft.  Genitourinary:    Genitourinary Comments: NEFG; no discharge or blood in the vagina, no lesions on vaginal walls or cervix; no CMT, suprapubic or adnexal tenderness.    Musculoskeletal: Normal range of motion.  Neurological: She is alert.  Skin: Skin is warm and dry.    MAU Course  Procedures  MDM -[name] and BHCG to rule out ectopic pregnancy;  -betaHCG shows is 64 -wet prep: positive for clue cells; patient declines Flagyl as she already has metrogel at home  -US shows empty uterus with no gestational sac, cannot exclude ectopic pregnancy.   Assessment and Plan   1. Pregnancy of unknown anatomic location   2. Abdominal pain    2. Patient stable for discharge with plan to follow up for stat BHCG on 5-26 at 2 pm. Lab visit scheduled and orders placed. Gave new address and plan to patient (that we will call her with results; she agrees to be available by phone).   3. Reviewed strict ectopic precautions; patient verbalized understanding; all questions answered.   4. Message sent to clinic to schedule patient for NOB in 6 weeks.   Mervyn Skeeters [place] 05/15/2019, 3:41 PM

## 2019-05-15 NOTE — Discharge Instructions (Signed)
-  come to [facility] [place]. [facility]  On the 2nd FLoor at 2 pm on Tuesday, May  26 for repeat beta hcg   Ectopic Pregnancy  An ectopic pregnancy happens when a fertilized egg grows outside the womb (uterus). The fertilized egg cannot stay alive outside of the womb. This problem often happens in a fallopian tube. It is often caused by damage to the tube. If this problem is found early, you may be treated with medicine that stops the egg from growing. If your tube tears or bursts open (ruptures), you will bleed inside. Often, there is very bad pain in the lower belly. This is an emergency. You will need surgery. Get help right away. Follow these instructions at home: After being treated with medicine or surgery:  Rest and limit your activity for as long as told by your doctor.  Until your doctor says that it is safe: ? Do not lift anything that is heavier than 10 lb (4.5 kg) or the limit that your doctor tells you. ? Avoid exercise and any movement that takes a lot of effort.  To prevent problems when pooping (constipation): ? Eat a healthy diet. This includes:  Fruits.  Vegetables.  Whole grains. ? Drink 6-8 glasses of water a day. Contact a doctor if: Get help right away if:  You have sudden and very bad pain in your belly.  You have very bad pain in your shoulders or neck.  You have pain that gets worse and is not helped by medicine.  You have: ? A fever or chills. ? Vaginal bleeding. ? Redness or swelling at the site of a surgical cut (incision).  You feel sick to your stomach (nauseous) or you throw up (vomit).  You feel dizzy or weak.  You feel light-headed or you pass out (faint). Summary  An ectopic pregnancy happens when a fertilized egg grows outside the womb (uterus).  If this problem is found early, you may be treated with medicine that stops the egg from growing.  If your tube tears or bursts open (ruptures), you will need surgery. This is an  emergency. Get help right away. This information is not intended to replace advice given to you by your health care provider. Make sure you discuss any questions you have with your health care provider. Document Released: 03/07/2009 Document Revised: 01/02/2017 Document Reviewed: 01/02/2017 Elsevier Interactive Patient Education  2019 [employer].

## 2019-05-18 ENCOUNTER — Ambulatory Visit (INDEPENDENT_AMBULATORY_CARE_PROVIDER_SITE_OTHER): Payer: Medicaid Other

## 2019-05-18 ENCOUNTER — Other Ambulatory Visit: Payer: Self-pay

## 2019-05-18 DIAGNOSIS — O3680X0 Pregnancy with inconclusive fetal viability, not applicable or unspecified: Secondary | ICD-10-CM

## 2019-05-18 LAB — BETA HCG QUANT (REF LAB): hCG Quant: 329 m[IU]/mL

## 2019-05-18 LAB — GC/CHLAMYDIA PROBE AMP (~~LOC~~) NOT AT ARMC
Chlamydia: NEGATIVE
Neisseria Gonorrhea: NEGATIVE

## 2019-05-18 NOTE — Progress Notes (Signed)
Pt here today for STAT Beta Lab.  Pt denies any pain or bleeding.  Pt advised that it will take at least two hours for results and f/u and that we will call her.  Pt agreed.   Received call from [facility] beta level of 329.  Notified Maye Hides, CNM who recommended pt to f/u US appt in two weeks.  Scheduled IB [name] appt for 05/31/19 @ 1345.  Notified pt of provider's recommendation and OB US appt.  Pt stated understanding with no further questions.

## 2019-05-19 NOTE — Progress Notes (Signed)
Chart reviewed for nurse visit. Agree with plan of care.   Angela Branch, [place] 05/19/2019 12:27 PM

## 2019-05-31 ENCOUNTER — Ambulatory Visit (INDEPENDENT_AMBULATORY_CARE_PROVIDER_SITE_OTHER): Payer: Medicaid Other

## 2019-05-31 ENCOUNTER — Other Ambulatory Visit: Payer: Self-pay

## 2019-05-31 ENCOUNTER — Ambulatory Visit (HOSPITAL_COMMUNITY)
Admission: RE | Admit: 2019-05-31 | Discharge: 2019-05-31 | Disposition: A | Discharge status: Home / Self Care | Payer: Medicaid Other | Source: Ambulatory Visit | Attending: Student | Admitting: Student

## 2019-05-31 DIAGNOSIS — O368390 Maternal care for abnormalities of the fetal heart rate or rhythm, unspecified trimester, not applicable or unspecified: Secondary | ICD-10-CM

## 2019-05-31 DIAGNOSIS — O368310 Maternal care for abnormalities of the fetal heart rate or rhythm, first trimester, not applicable or unspecified: Secondary | ICD-10-CM | POA: Diagnosis not present

## 2019-05-31 DIAGNOSIS — Z3A01 Less than 8 weeks gestation of pregnancy: Secondary | ICD-10-CM | POA: Diagnosis not present

## 2019-05-31 DIAGNOSIS — O3680X0 Pregnancy with inconclusive fetal viability, not applicable or unspecified: Secondary | ICD-10-CM | POA: Diagnosis not present

## 2019-05-31 MED ORDER — METOCLOPRAMIDE HCL 10 MG PO TABS: 10.0000 mg | ORAL_TABLET | Freq: Three times a day (TID) | ORAL | 0 refills | Status: DC

## 2019-05-31 NOTE — Progress Notes (Signed)
Agree with A & P. 

## 2019-05-31 NOTE — Progress Notes (Addendum)
Pt here today for OB US results. [name] report shows fetal bradycardia.  Notified Dr. Rip Harbour who recommends [name] in 10 days.  OB US scheduled for 06/10/19 @ 1445.  Pt notified of results and appt. Pt requested something for nausea that she can take everyday.  Pt denies any pain or bleeding. Dr. Rip Harbour recommended that we can prescribe Reglan 10 mg po tablet q AC for nausea.  Advised pt that she can try the Reglan first and we could possibly prescribe Diclegis however it takes time because her insurance requires a prior auth which may take up to a week.  Informed pt that provider's recommendation. Medication prescribed to [facility] on [address]. Pt verbalized understanding with further questions.

## 2019-06-03 ENCOUNTER — Encounter (HOSPITAL_COMMUNITY): Payer: Self-pay | Admitting: *Deleted

## 2019-06-03 ENCOUNTER — Other Ambulatory Visit: Payer: Self-pay

## 2019-06-03 ENCOUNTER — Inpatient Hospital Stay (HOSPITAL_COMMUNITY): Payer: Medicaid Other

## 2019-06-03 ENCOUNTER — Inpatient Hospital Stay (HOSPITAL_COMMUNITY)
Admission: AD | Admit: 2019-06-03 | Discharge: 2019-06-04 | Disposition: A | Discharge status: Home / Self Care | Payer: Medicaid Other | Attending: Obstetrics and Gynecology | Admitting: Obstetrics and Gynecology

## 2019-06-03 DIAGNOSIS — O208 Other hemorrhage in early pregnancy: Secondary | ICD-10-CM | POA: Diagnosis not present

## 2019-06-03 DIAGNOSIS — O209 Hemorrhage in early pregnancy, unspecified: Secondary | ICD-10-CM

## 2019-06-03 DIAGNOSIS — O210 Mild hyperemesis gravidarum: Secondary | ICD-10-CM | POA: Diagnosis not present

## 2019-06-03 DIAGNOSIS — O26851 Spotting complicating pregnancy, first trimester: Secondary | ICD-10-CM | POA: Diagnosis not present

## 2019-06-03 DIAGNOSIS — R112 Nausea with vomiting, unspecified: Secondary | ICD-10-CM

## 2019-06-03 DIAGNOSIS — Z3A01 Less than 8 weeks gestation of pregnancy: Secondary | ICD-10-CM | POA: Insufficient documentation

## 2019-06-03 LAB — URINALYSIS, ROUTINE W REFLEX MICROSCOPIC
Glucose, UA: NEGATIVE mg/dL
Ketones, ur: 20 mg/dL — AB
Leukocytes,Ua: NEGATIVE
Nitrite: NEGATIVE
Protein, ur: 30 mg/dL — AB
Specific Gravity, Urine: 1.036 — ABNORMAL HIGH (ref 1.005–1.030)
pH: 5 (ref 5.0–8.0)

## 2019-06-03 MED ORDER — PROMETHAZINE HCL 25 MG PO TABS: 25.0000 mg | ORAL_TABLET | Freq: Four times a day (QID) | ORAL | 2 refills | Status: DC | PRN

## 2019-06-03 MED ORDER — LACTATED RINGERS IV BOLUS
1000.0000 mL | Freq: Once | INTRAVENOUS | Status: AC
  Administered 2019-06-03: 1000 mL via INTRAVENOUS

## 2019-06-03 MED ORDER — PROMETHAZINE HCL 25 MG/ML IJ SOLN
12.5000 mg | Freq: Once | INTRAMUSCULAR | Status: AC
  Administered 2019-06-03: 23:00:00 12.5 mg via INTRAVENOUS
  Filled 2019-06-03: qty 1

## 2019-06-03 NOTE — MAU Note (Signed)
Pt reports to MAU c/o vaginal bleeding that started about a hour and a half ago. Pt reports the bleeding was brown in color. Pt states it was a good amount enough to worry her. Pt denies any clots. Pt reports she wore a pad here but it only had a small line on it here. Pt reports some cramping in her lower abdomen pt reports the pain is a 7/10. Pt reports she hasn't been eating good because of the vomiting.

## 2019-06-03 NOTE — Discharge Instructions (Signed)
Vaginal Bleeding During Pregnancy, First Trimester  A small amount of bleeding (spotting) from the vagina is common during early pregnancy. Sometimes the bleeding is normal and does not cause problems. At other times, though, bleeding may be a sign of something serious. Tell your doctor about any bleeding from your vagina right away. Follow these instructions at home: Activity  Follow your doctor's instructions about how active you can be.  If needed, make plans for someone to help with your normal activities.  Do not have sex or orgasms until your doctor says that this is safe. General instructions  Take over-the-counter and prescription medicines only as told by your doctor.  Watch your condition for any changes.  Write down: ? The number of pads you use each day. ? How often you change pads. ? How soaked (saturated) your pads are.  Do not use tampons.  Do not douche.  If you pass any tissue from your vagina, save it to show to your doctor.  Keep all follow-up visits as told by your doctor. This is important. Contact a doctor if:  You have vaginal bleeding at any time while you are pregnant.  You have cramps.  You have a fever. Get help right away if:  You have very bad cramps in your back or belly (abdomen).  You pass large clots or a lot of tissue from your vagina.  Your bleeding gets worse.  You feel light-headed.  You feel weak.  You pass out (faint).  You have chills.  You are leaking fluid from your vagina.  You have a gush of fluid from your vagina. Summary  Sometimes vaginal bleeding during pregnancy is normal and does not cause problems. At other times, bleeding may be a sign of something serious.  Tell your doctor about any bleeding from your vagina right away.  Follow your doctor's instructions about how active you can be. You may need someone to help you with your normal activities. This information is not intended to replace advice given to  you by your health care provider. Make sure you discuss any questions you have with your health care provider. Document Released: 04/25/2014 Document Revised: 03/12/2017 Document Reviewed: 03/12/2017 Elsevier Interactive Patient Education  2019 [employer] Morning Sickness  Morning sickness is when a woman feels nauseous during pregnancy. This nauseous feeling may or may not come with vomiting. It often occurs in the morning, but it can be a problem at any time of day. Morning sickness is most common during the first trimester. In some cases, it may continue throughout pregnancy. Although morning sickness is unpleasant, it is usually harmless unless the woman develops severe and continual vomiting (hyperemesis gravidarum), a condition that requires more intense treatment. What are the causes? The exact cause of this condition is not known, but it seems to be related to normal hormonal changes that occur in pregnancy. What increases the risk? You are more likely to develop this condition if:  You experienced nausea or vomiting before your pregnancy.  You had morning sickness during a previous pregnancy.  You are pregnant with more than one baby, such as twins. What are the signs or symptoms? Symptoms of this condition include:  Nausea.  Vomiting. How is this diagnosed? This condition is usually diagnosed based on your signs and symptoms. How is this treated? In many cases, treatment is not needed for this condition. Making some changes to what you eat may help to control symptoms. Your health care provider may also prescribe  or recommend:  Vitamin B6 supplements.  Anti-nausea medicines.  Ginger. Follow these instructions at home: Medicines  Take over-the-counter and prescription medicines only as told by your health care provider. Do not use any prescription, over-the-counter, or herbal medicines for morning sickness without first talking with your health care provider.  Taking  multivitamins before getting pregnant can prevent or decrease the severity of morning sickness in most women. Eating and drinking  Eat a piece of dry toast or crackers before getting out of bed in the morning.  Eat 5 or 6 small meals a day.  Eat dry and bland foods, such as rice or a baked potato. Foods that are high in carbohydrates are often helpful.  Avoid greasy, fatty, and spicy foods.  Have someone cook for you if the smell of any food causes nausea and vomiting.  If you feel nauseous after taking prenatal vitamins, take the vitamins at night or with a snack.  Snack on protein foods between meals if you are hungry. Nuts, yogurt, and cheese are good options.  Drink fluids throughout the day.  Try ginger ale made with real ginger, ginger tea made from fresh grated ginger, or ginger candies. General instructions  Do not use any products that contain nicotine or tobacco, such as cigarettes and e-cigarettes. If you need help quitting, ask your health care provider.  Get an air purifier to keep the air in your house free of odors.  Get plenty of fresh air.  Try to avoid odors that trigger your nausea.  Consider trying these methods to help relieve symptoms: ? Wearing an acupressure wristband. These wristbands are often worn for seasickness. ? Acupuncture. Contact a health care provider if:  Your home remedies are not working and you need medicine.  You feel dizzy or light-headed.  You are losing weight. Get help right away if:  You have persistent and uncontrolled nausea and vomiting.  You faint.  You have severe pain in your abdomen. Summary  Morning sickness is when a woman feels nauseous during pregnancy. This nauseous feeling may or may not come with vomiting.  Morning sickness is most common during the first trimester.  It often occurs in the morning, but it can be a problem at any time of day.  In many cases, treatment is not needed for this condition.  Making some changes to what you eat may help to control symptoms. This information is not intended to replace advice given to you by your health care provider. Make sure you discuss any questions you have with your health care provider. Document Released: 01/30/2007 Document Revised: 01/11/2017 Document Reviewed: 01/11/2017 Elsevier Interactive Patient Education  2019 [employer].

## 2019-06-03 NOTE — MAU Provider Note (Signed)
Chief Complaint: Vaginal Bleeding   First Provider Initiated Contact with Patient 06/03/19 2201        SUBJECTIVE HPI: Angela Branch is a 23 y.o. [id number] at [redacted]w[redacted]d by LMP who presents to maternity admissions reporting vaginal bleeding and cramping.  Also c/o nausea and vomiting, unrelieved by Reglan.  She denies vaginal itching/burning, urinary symptoms, h/a, dizziness, n/v, or fever/chills.    Recently had an ultrasound which showed fetal bradycardia.     Past Medical History:  Diagnosis Date  . Anemia   . Anxiety   . Asthma   . Thyroid disease    Past Surgical History:  Procedure Laterality Date  . TOOTH EXTRACTION     Social History   Socioeconomic History  . Marital status: Single    Spouse name: Not on file  . Number of children: Not on file  . Years of education: 9th  . Highest education level: Not on file  Occupational History    Employer: UNEMPLOYED  Social Needs  . Financial resource strain: Not on file  . Food insecurity    Worry: Not on file    Inability: Not on file  . Transportation needs    Medical: Not on file    Non-medical: Not on file  Tobacco Use  . Smoking status: Never Smoker  . Smokeless tobacco: Never Used  Substance and Sexual Activity  . Alcohol use: No    Alcohol/week: 0.0 standard drinks  . Drug use: No  . Sexual activity: Yes    Birth control/protection: None    Comment: last sex Sep 06 2018  Lifestyle  . Physical activity    Days per week: Not on file    Minutes per session: Not on file  . Stress: Not on file  Relationships  . Social Herbalist on phone: Not on file    Gets together: Not on file    Attends religious service: Not on file    Active member of club or organization: Not on file    Attends meetings of clubs or organizations: Not on file    Relationship status: Not on file  . Intimate partner violence    Fear of current or ex partner: Not on file    Emotionally abused: Not on file    Physically abused:  Not on file    Forced sexual activity: Not on file  Other Topics Concern  . Not on file  Social History Narrative  . Not on file   No current facility-administered medications on file prior to encounter.    Current Outpatient Medications on File Prior to Encounter  Medication Sig Dispense Refill  . metoCLOPramide (REGLAN) 10 MG tablet Take 10 mg by mouth 4 (four) times daily.    . metoCLOPramide (REGLAN) 10 MG tablet Take 1 tablet (10 mg total) by mouth 4 (four) times daily -  before meals and at bedtime. 120 tablet 0  . albuterol (PROVENTIL HFA;VENTOLIN HFA) 108 (90 Base) MCG/ACT inhaler Inhale 2 puffs into the lungs every 6 (six) hours as needed for wheezing or shortness of breath.    . valACYclovir (VALTREX) 1000 MG tablet Take 1 tablet (1,000 mg total) by mouth 2 (two) times daily. (Patient not taking: Reported on 11/03/2018) 60 tablet 1   Allergies  Allergen Reactions  . Azithromycin     Diarrhea, nausea/vomiting, IV burns vein    I have reviewed patient's Past Medical Hx, Surgical Hx, Family Hx, Social Hx, medications and allergies.  ROS:  Review of Systems  Constitutional: Negative for chills and fever.  Respiratory: Negative for shortness of breath.   Gastrointestinal: Positive for constipation, nausea and vomiting. Negative for diarrhea.  Genitourinary: Positive for pelvic pain and vaginal bleeding.   Review of Systems  Other systems negative   Physical Exam  Physical Exam No data found. Constitutional: Well-developed, female in no acute distress.  Cardiovascular: normal rate Respiratory: normal effort GI: Abd soft, non-tender. Pos BS x 4 MS: Extremities nontender, no edema, normal ROM Neurologic: Alert and oriented x 4.  GU: Neg CVAT.  PELVIC EXAM: scant blood   LAB RESULTS Results for orders placed or performed during the hospital encounter of 06/03/19 (from the past 24 hour(s))  Urinalysis, Routine w reflex microscopic     Status: Abnormal   Collection  Time: 06/03/19  9:50 PM  Result Value Ref Range   Color, Urine AMBER (A) YELLOW   APPearance HAZY (A) CLEAR   Specific Gravity, Urine 1.036 (H) 1.005 - 1.030   pH 5.0 5.0 - 8.0   Glucose, UA NEGATIVE NEGATIVE mg/dL   Hgb urine dipstick LARGE (A) NEGATIVE   Bilirubin Urine SMALL (A) NEGATIVE   Ketones, ur 20 (A) NEGATIVE mg/dL   Protein, ur 30 (A) NEGATIVE mg/dL   Nitrite NEGATIVE NEGATIVE   Leukocytes,Ua NEGATIVE NEGATIVE   RBC / HPF 0-5 0 - 5 RBC/hpf   WBC, UA 0-5 0 - 5 WBC/hpf   Bacteria, UA FEW (A) NONE SEEN   Squamous Epithelial / LPF 11-20 0 - 5   Mucus PRESENT    Hyaline Casts, UA PRESENT     --/--/A POS (09/30 1725)  IMAGING US Ob Transvaginal  Result Date: 06/03/2019 CLINICAL DATA:  23 year old female with bleeding in the 1st trimester. Recent ultrasound demonstrating IUP with fetal cardiac activity. EXAM: TRANSVAGINAL OB ULTRASOUND TECHNIQUE: Transvaginal ultrasound was performed for complete evaluation of the gestation as well as the maternal uterus, adnexal regions, and pelvic cul-de-sac. COMPARISON:  05/31/2019. FINDINGS: Intrauterine gestational sac: Single Yolk sac:  Visible Embryo:  Visible Cardiac Activity: Detected Heart Rate: 115 bpm CRL:   3.6 mm   6 w 0 d                  [name] EDC: 01/27/2020 Subchorionic hemorrhage:  Trace (image 15). Maternal uterus/adnexae: The left ovary appears normal measuring 3.4 x 2.7 x 2.2 centimeters. The right ovary appears normal measuring 2.4 x 2.3 x 2.1 cm. No pelvic free fluid. IMPRESSION: Single living IUP demonstrated with trace subchorionic hemorrhage, but no other acute maternal findings visualized. Electronically Signed   By: Genevie Ann M.D.   On: 06/03/2019 22:24   MAU Management/MDM: Ordered Ultrasound to rule out spontaneous abortion   US findings were encouraging, trace [facility]  IV fluids given for rehydration. Phenergan given for nausea. .Got relief from that and was able to tolerate PO intake   ASSESSMENT 1. Antepartum bleeding,  first trimester   2.     Nausea and vomiting of early pregnancy  PLAN Discharge home Reassured patient baby was still living Discussed [facility] and bleeding precautions. Pelvic rest recommended Rx Phenergan for alternate nausea medication, warned about side effects  Pt stable at time of discharge. Encouraged to return here or to other Urgent Care/ED if she develops worsening of symptoms, increase in pain, fever, or other concerning symptoms.    Hansel Feinstein CNM, MSN Certified Nurse-Midwife 06/03/2019  11:33 PM

## 2019-06-10 ENCOUNTER — Ambulatory Visit (HOSPITAL_COMMUNITY): Payer: Medicaid Other

## 2019-06-18 ENCOUNTER — Other Ambulatory Visit: Payer: Self-pay

## 2019-06-18 ENCOUNTER — Encounter (HOSPITAL_COMMUNITY): Payer: Self-pay

## 2019-06-18 ENCOUNTER — Inpatient Hospital Stay (HOSPITAL_COMMUNITY)
Admission: AD | Admit: 2019-06-18 | Discharge: 2019-06-19 | Disposition: A | Discharge status: Home / Self Care | Payer: Medicaid Other | Attending: Obstetrics and Gynecology | Admitting: Obstetrics and Gynecology

## 2019-06-18 DIAGNOSIS — O210 Mild hyperemesis gravidarum: Secondary | ICD-10-CM | POA: Diagnosis not present

## 2019-06-18 DIAGNOSIS — F12188 Cannabis abuse with other cannabis-induced disorder: Secondary | ICD-10-CM | POA: Diagnosis not present

## 2019-06-18 DIAGNOSIS — O99321 Drug use complicating pregnancy, first trimester: Secondary | ICD-10-CM | POA: Diagnosis not present

## 2019-06-18 DIAGNOSIS — Z3A09 9 weeks gestation of pregnancy: Secondary | ICD-10-CM | POA: Diagnosis not present

## 2019-06-18 DIAGNOSIS — R109 Unspecified abdominal pain: Secondary | ICD-10-CM | POA: Diagnosis present

## 2019-06-18 LAB — URINALYSIS, ROUTINE W REFLEX MICROSCOPIC
Bilirubin Urine: NEGATIVE
Glucose, UA: NEGATIVE mg/dL
Hgb urine dipstick: NEGATIVE
Ketones, ur: NEGATIVE mg/dL
Nitrite: NEGATIVE
Protein, ur: 30 mg/dL — AB
Specific Gravity, Urine: 1.033 — ABNORMAL HIGH (ref 1.005–1.030)
pH: 6 (ref 5.0–8.0)

## 2019-06-18 MED ORDER — PROMETHAZINE HCL 25 MG/ML IJ SOLN
12.5000 mg | Freq: Once | INTRAMUSCULAR | Status: AC
  Administered 2019-06-18: 12.5 mg via INTRAVENOUS
  Filled 2019-06-18: qty 1

## 2019-06-18 MED ORDER — M.V.I. ADULT IV INJ
Freq: Once | INTRAVENOUS | Status: AC
  Administered 2019-06-18: 22:00:00 via INTRAVENOUS
  Filled 2019-06-18: qty 10

## 2019-06-18 MED ORDER — PANTOPRAZOLE SODIUM 40 MG IV SOLR
40.0000 mg | Freq: Once | INTRAVENOUS | Status: AC
  Administered 2019-06-18: 40 mg via INTRAVENOUS
  Filled 2019-06-18: qty 40

## 2019-06-18 MED ORDER — SCOPOLAMINE 1 MG/3DAYS TD PT72
1.0000 | MEDICATED_PATCH | Freq: Once | TRANSDERMAL | Status: DC
  Administered 2019-06-18: 1.5 mg via TRANSDERMAL
  Filled 2019-06-18: qty 1

## 2019-06-18 MED ORDER — LACTATED RINGERS IV BOLUS
1000.0000 mL | Freq: Once | INTRAVENOUS | Status: AC
  Administered 2019-06-18: 21:00:00 1000 mL via INTRAVENOUS

## 2019-06-18 NOTE — MAU Note (Signed)
Pt called out stating she felt SOB and lightheaded. No pain. 02 sat 100%, HR 125, RR 18, BP 113/40. Pt instructed to take slow deep breaths. CNM notified.

## 2019-06-18 NOTE — MAU Provider Note (Signed)
History     CSN: [id number]  Arrival date and time: 06/18/19 1925   First Provider Initiated Contact with Patient 06/18/19 2007      Chief Complaint  Patient presents with  . Abdominal Pain   Angela Branch is a 23 y.o. [id number] at [redacted]w[redacted]d who has not established prenatal care.  She presents today for Nausea, Vomiting, and Cramping.  She states she was prescribed promethazine, but has not taken them for a week because she can't keep them down.  Patient states that she has tried taking them as a vaginal suppository, but did not get relief.  She reports she last had vomiting while at in MAU and endorses current nausea.  She states the vomiting is no longer appears to be vomit and she endorses the feeling of increased saliva.  She reports concern regarding "a drop in my weight" stating that she know it is a drastic change.  She endorses marijuana usage and states that it helps with her nausea and vomiting.  She reports she last used "a couple of days ago." She reports that she is also having cramping that has been present for the past week.  She states the cramping occurs with vomiting and she states she feels it is like "hunger cramps." She reports eating mashed potatoes around 3pm with some vomiting.       OB History    Gravida  4   Para  2   Term  2   Preterm  0   AB  1   Living  2     SAB  1   TAB  0   Ectopic  0   Multiple  0   Live Births  2           Past Medical History:  Diagnosis Date  . Anemia   . Anxiety   . Asthma   . Thyroid disease     Past Surgical History:  Procedure Laterality Date  . TOOTH EXTRACTION      Family History  Problem Relation Age of Onset  . Asthma Father   . Diabetes Maternal Grandmother   . Hyperlipidemia Paternal Grandfather   . Heart disease Paternal Grandfather   . Cancer - Colon Maternal Grandfather   . Mental illness Neg Hx   . Birth defects Neg Hx   . Kidney disease Neg Hx   . Hypertension Neg Hx     Social  History   Tobacco Use  . Smoking status: Never Smoker  . Smokeless tobacco: Never Used  Substance Use Topics  . Alcohol use: No    Alcohol/week: 0.0 standard drinks  . Drug use: No    Allergies:  Allergies  Allergen Reactions  . Azithromycin     Diarrhea, nausea/vomiting, IV burns vein    Medications Prior to Admission  Medication Sig Dispense Refill Last Dose  . metoCLOPramide (REGLAN) 10 MG tablet Take 1 tablet (10 mg total) by mouth 4 (four) times daily -  before meals and at bedtime. 120 tablet 0 Past Month at Unknown time  . promethazine (PHENERGAN) 25 MG tablet Take 1 tablet (25 mg total) by mouth every 6 (six) hours as needed for nausea or vomiting. 30 tablet 2 Past Week at Unknown time  . albuterol (PROVENTIL HFA;VENTOLIN HFA) 108 (90 Base) MCG/ACT inhaler Inhale 2 puffs into the lungs every 6 (six) hours as needed for wheezing or shortness of breath.     . metoCLOPramide (REGLAN) 10 MG tablet Take  10 mg by mouth 4 (four) times daily.     . valACYclovir (VALTREX) 1000 MG tablet Take 1 tablet (1,000 mg total) by mouth 2 (two) times daily. (Patient not taking: Reported on 11/03/2018) 60 tablet 1     Review of Systems  Constitutional: Negative for chills and fever.  Respiratory: Negative for cough and shortness of breath.   Gastrointestinal: Positive for nausea and vomiting. Negative for constipation and diarrhea.  Genitourinary: Negative for difficulty urinating, dysuria, vaginal bleeding and vaginal discharge.  Neurological: Negative for dizziness, light-headedness and headaches.   Physical Exam   Blood pressure 116/72, pulse 89, temperature 98.2 F (36.8 C), resp. rate 18, height $RemoveBe'5\' 2"'fjKbADcyG$  (1.575 m), weight 56.7 kg, last menstrual period 04/12/2019, unknown if currently breastfeeding.  Physical Exam  Constitutional: She is oriented to person, place, and time. She appears well-developed and well-nourished.  HENT:  Head: Normocephalic and atraumatic.  Eyes: Conjunctivae  are normal.  Neck: Normal range of motion.  Cardiovascular: Normal rate, regular rhythm and normal heart sounds.  Respiratory: Effort normal and breath sounds normal.  GI: Soft.  Genitourinary:    Vagina normal.   Musculoskeletal: Normal range of motion.  Neurological: She is alert and oriented to person, place, and time.  Skin: Skin is warm and dry.  Psychiatric: She has a normal mood and affect. Her behavior is normal.    Patient informed that the ultrasound is considered a limited OB ultrasound and is not intended to be a complete ultrasound exam.  Patient also informed that the ultrasound is not being completed with the intent of assessing for fetal or placental anomalies or any pelvic abnormalities.  Explained that the purpose of today's ultrasound is to assess for  viability.  Patient acknowledges the purpose of the exam and the limitations of the study. FHR 180  MAU Course  Procedures Results for orders placed or performed during the hospital encounter of 06/18/19 (from the past 24 hour(s))  Urinalysis, Routine w reflex microscopic     Status: Abnormal   Collection Time: 06/18/19  8:57 PM  Result Value Ref Range   Color, Urine AMBER (A) YELLOW   APPearance CLOUDY (A) CLEAR   Specific Gravity, Urine 1.033 (H) 1.005 - 1.030   pH 6.0 5.0 - 8.0   Glucose, UA NEGATIVE NEGATIVE mg/dL   Hgb urine dipstick NEGATIVE NEGATIVE   Bilirubin Urine NEGATIVE NEGATIVE   Ketones, ur NEGATIVE NEGATIVE mg/dL   Protein, ur 30 (A) NEGATIVE mg/dL   Nitrite NEGATIVE NEGATIVE   Leukocytes,Ua TRACE (A) NEGATIVE   RBC / HPF 0-5 0 - 5 RBC/hpf   WBC, UA 6-10 0 - 5 WBC/hpf   Bacteria, UA RARE (A) NONE SEEN   Squamous Epithelial / LPF 21-50 0 - 5   Mucus PRESENT     MDM Start IV LR Bolus f/b Banana Bag Antiemetic PPI Assessment and Plan  23 year old [id number] at 9.5 weeks Cannabinoid Hyperemesis   -Extensive discussion regarding cessation of marijuana usage, without relapse, to avoid continued  nausea/vomiting. -Educated on cannabinoid related hyperemesis and its effects. -Patient given realistic expectations regarding nausea and vomiting in pregnancy.  Informed that goal is to be functional, but will still experience bouts of nausea/vomiting.  -Discussed diet to improve symptoms of nausea/vomiting including bland diet, frequent high protein snacks, frequent small sips of fluids, and possible protein drinks/shakes.  -Questions and concerns addressed. -Will start IV and give LR f/b MVI. -Protonix 20 mg and Phenergan 12.$RemoveBeforeDE'5mg'NORLyqteXSYRvDU$  via IV. -Will send  urine once patient able to leave sample.  Follow Up (10:24 PM)  -Patient reports improvement in nausea. -Reports cramping continues. -Reports that she could try to eat. -Will do PO challenge with sandwich and ginger ale per patient request. -BSUS confirms IUP with CRL of 9.1 weeks which is c/w dates.  FHR 180s  Follow Up (12:36 AM)  -Patient reports onset of nausea and vomiting after eating. -Will give phenergan 12.5 mg IV now -Educated on initiation of medication regime to assist with decreasing nausea/vomiting incidences. -Discussed taking phenergan and reglan daily with initiation of bonjesta/diclegis once care established at [facility] clinic of her choosing. -Patient reports cramping continues despite eating and accepts tylenol suppository. -Bleeding precautions given. -Encouraged to call or return to MAU if symptoms worsen or with the onset of new symptoms. -Plan to discharge after administration of pain medication  Follow Up (1:15 AM)  -Nurse reports patient refused Tylenol suppository. -UDS ordered. -Patient discharged with precautions.   Maryann Conners MSN, CNM 06/18/2019, 8:08 PM    Addendum (0425) -UDS ordered, but cancelled due to requisition not being received by lab. -Will place not for UDS at next visit.   Maryann Conners 4:26 AM

## 2019-06-18 NOTE — MAU Note (Signed)
Pt reports abd cramping and nausea/vomiting. x 2-3 weeks. When she vomits now her throat is irritated and she is seeing specks of blood . Was started on promethazine. Unable to keep them down for the past 2-3days. abd cramping worse while vomiting.

## 2019-06-18 NOTE — MAU Note (Addendum)
Pt here for constant nausea/vomiting and cramping. States she was started on promethazine, but unable to keep it down. States she tried to put it in her vagina but that didn't help. Reports seeing specks of blood in vomit. Feels like she has a lot of acid. Tried to eat mashed potatoes around 1530, but it came back up. Last took promethazine 1 week ago.

## 2019-06-19 MED ORDER — PROMETHAZINE HCL 25 MG/ML IJ SOLN
12.5000 mg | Freq: Once | INTRAMUSCULAR | Status: AC
  Administered 2019-06-19: 12.5 mg via INTRAVENOUS
  Filled 2019-06-19: qty 1

## 2019-06-19 MED ORDER — ACETAMINOPHEN 650 MG RE SUPP
650.0000 mg | Freq: Once | RECTAL | Status: DC
  Filled 2019-06-19: qty 1

## 2019-06-19 MED ORDER — SCOPOLAMINE 1 MG/3DAYS TD PT72: 1.0000 | MEDICATED_PATCH | Freq: Once | TRANSDERMAL | 2 refills | Status: AC

## 2019-06-19 MED ORDER — PANTOPRAZOLE SODIUM 20 MG PO TBEC: 20.0000 mg | DELAYED_RELEASE_TABLET | Freq: Every day | ORAL | 4 refills | Status: DC

## 2019-06-19 NOTE — MAU Note (Signed)
Pt stated "she wants to give it", referring to tylenol suppository. This RN offered to let pt place it and told patient I had to watch her place it. Package was opened and then pt refused stating "I dont feel comfortable with you watching me."

## 2019-06-19 NOTE — Discharge Instructions (Signed)
Cannabinoid Hyperemesis Syndrome Cannabinoid hyperemesis syndrome (CHS) is a condition that causes repeated nausea, vomiting, and abdominal pain after long-term (chronic) use of marijuana (cannabis). People with CHS typically use marijuana 3-5 times a day for many years before they have symptoms, although it is possible to develop CHS with as little as 1 use per day. Symptoms of CHS may be mild at first but can get worse and more frequent. In some cases, CHS may cause vomiting many times a day, which can lead to weight loss and dehydration. CHS may go away and come back many times (recur). People may not have symptoms or may otherwise be healthy in between [facility] attacks. What are the causes? The exact cause of this condition is not known. Long-term use of marijuana may over-stimulate certain proteins in the brain that react with chemicals in marijuana (cannabinoid receptors). This over-stimulation may cause CHS. What are the signs or symptoms? Symptoms of this condition are often mild during the first few attacks, but they can get worse over time. Symptoms may include:  Frequent nausea, especially early in the morning.  Vomiting.  Abdominal pain. Taking several hot showers throughout the day can also be a sign of this condition. People with CHS may do this because it relieves symptoms. How is this diagnosed? This condition may be diagnosed based on:  Your symptoms and medical history, including any drug use.  A physical exam. You may have tests done to rule out other problems. These tests may include:  Blood tests.  Urine tests.  Imaging tests, such as an X-ray or CT scan. How is this treated? Treatment for this condition involves stopping marijuana use. Your health care provider may recommend:  A drug rehabilitation program, if you have trouble stopping marijuana use.  Medicines for nausea.  Hot showers to help relieve symptoms. Certain creams that contain a substance called  capsaicin may improve symptoms when applied to the abdomen. Ask your health care provider before starting any medicines or other treatments. Severe nausea and vomiting may require you to stay at the hospital. You may need IV fluids to prevent or treat dehydration. You may also need certain medicines that must be given at the hospital. Follow these instructions at home: During an attack   Stay in bed and rest in a dark, quiet room.  Take anti-nausea medicine as told by your health care provider.  Try taking hot showers to relieve your symptoms. After an attack  Drink small amounts of clear fluids slowly. Gradually add more.  Once you are able to eat without vomiting, eat soft foods in small amounts every 3-4 hours. General instructions   Do not use any products that contain marijuana.If you need help quitting, ask your health care provider for resources and treatment options.  Drink enough fluid to keep your urine pale yellow. Avoid drinking fluids that have a lot of sugar or caffeine, such as coffee and soda.  Take and apply over-the-counter and prescription medicines only as told by your health care provider. Ask your health care provider before starting any new medicines or treatments.  Keep all follow-up visits as told by your health care provider. This is important. Contact a health care provider if:  Your symptoms get worse.  You cannot drink fluids without vomiting.  You have pain and trouble swallowing after an attack. Get help right away if:  You cannot stop vomiting.  You have blood in your vomit or your vomit looks like coffee grounds.  You have  severe abdominal pain.  You have stools that are bloody or black, or stools that look like tar.  You have symptoms of dehydration, such as: ? Sunken eyes. ? Inability to make tears. ? Cracked lips. ? Dry mouth. ? Decreased urine production. ? Weakness. ? Sleepiness. ? Fainting. Summary  Cannabinoid hyperemesis  syndrome (CHS) is a condition that causes repeated nausea, vomiting, and abdominal pain after long-term use of marijuana.  People with CHS typically use marijuana 3-5 times a day for many years before they have symptoms, although it is possible to develop CHS with as little as 1 use per day.  Treatment for this condition involves stopping marijuana use. Hot showers and capsaicin creams may also help relieve symptoms. Ask your health care provider before starting any medicines or other treatments.  Your health care provider may prescribe medicines to help with nausea.  Get help right away if you have signs of dehydration, such as dry mouth, decreased urine production, or weakness. This information is not intended to replace advice given to you by your health care provider. Make sure you discuss any questions you have with your health care provider. Document Released: 03/19/2017 Document Revised: 04/17/2018 Document Reviewed: 03/19/2017 Elsevier Patient Education  2020 [employer].

## 2019-06-21 ENCOUNTER — Other Ambulatory Visit: Payer: Self-pay | Admitting: Obstetrics and Gynecology

## 2019-06-21 ENCOUNTER — Telehealth: Payer: Self-pay | Admitting: Family Medicine

## 2019-06-21 ENCOUNTER — Ambulatory Visit (HOSPITAL_COMMUNITY)
Admission: RE | Admit: 2019-06-21 | Discharge: 2019-06-21 | Disposition: A | Discharge status: Home / Self Care | Payer: Medicaid Other | Source: Ambulatory Visit | Attending: Obstetrics and Gynecology | Admitting: Obstetrics and Gynecology

## 2019-06-21 ENCOUNTER — Other Ambulatory Visit: Payer: Self-pay

## 2019-06-21 DIAGNOSIS — O209 Hemorrhage in early pregnancy, unspecified: Secondary | ICD-10-CM | POA: Diagnosis not present

## 2019-06-21 DIAGNOSIS — O468X1 Other antepartum hemorrhage, first trimester: Secondary | ICD-10-CM | POA: Diagnosis present

## 2019-06-21 DIAGNOSIS — O368390 Maternal care for abnormalities of the fetal heart rate or rhythm, unspecified trimester, not applicable or unspecified: Secondary | ICD-10-CM | POA: Diagnosis present

## 2019-06-21 DIAGNOSIS — O418X10 Other specified disorders of amniotic fluid and membranes, first trimester, not applicable or unspecified: Secondary | ICD-10-CM | POA: Insufficient documentation

## 2019-06-21 DIAGNOSIS — Z3A08 8 weeks gestation of pregnancy: Secondary | ICD-10-CM | POA: Diagnosis not present

## 2019-06-21 NOTE — Telephone Encounter (Signed)
Patient came to the office today requesting to speak with a nurse, Told the patient I would have to send a message.  She said she was given a patch RX that she put behind her ear for nausea and she is requesting a New Rx for that, she said she is not sure the name of it.

## 2019-06-23 ENCOUNTER — Telehealth: Payer: Self-pay | Admitting: Family Medicine

## 2019-06-23 NOTE — Telephone Encounter (Signed)
Spoke with patient about her appointment on 7/2 @ 2:15. Patient was instructed that it will be a mychart visit and she cannot download the app right now because her phone is on 3% charge. Patient verbalized that she will download the app before her appointment tomorrow. Patient was not screened because it is a virtual appointment.

## 2019-06-24 ENCOUNTER — Other Ambulatory Visit: Payer: Self-pay

## 2019-06-24 ENCOUNTER — Telehealth (INDEPENDENT_AMBULATORY_CARE_PROVIDER_SITE_OTHER): Payer: Medicaid Other

## 2019-06-24 DIAGNOSIS — Z3490 Encounter for supervision of normal pregnancy, unspecified, unspecified trimester: Secondary | ICD-10-CM

## 2019-06-24 NOTE — Telephone Encounter (Signed)
Called pt after consult with [facility] regarding her Rx for Scopolomine patch previously prescribed. I advised pt that her insurance requires a prior authorization for the medication. I asked if pt still has Rx for promethazine. She stated that she does but she cannot keep it down because she is vomiting all Chalonda Schlatter. I offered a prescription for promethazine suppositories. Pt stated, "I'm not putting anything in my butt". I discussed with pt to try and take the promethazine pill at a time when she is less nauseated so that she will be able to tolerate it. Pt then stated, "It's ok I"ll figure it out."

## 2019-06-24 NOTE — Progress Notes (Signed)
I connected with  [name] on 06/24/19 at  2:15 PM EDT by telephone and verified that I am speaking with the correct person using two identifiers.   I discussed the limitations, risks, security and privacy concerns of performing an evaluation and management service by telephone and the availability of in person appointments. I also discussed with the patient that there may be a patient responsible charge related to this service. The patient expressed understanding and agreed to proceed.  Called pt in regards to NEW OB intake appt pt informed me that she was currently driving and would not be home until later.  I asked pt if she wanted to be rescheduled.  Pt stated yes she would just prefer to be rescheduled because she was having trouble logging in the MyChart app after she had parked for the visit.  Pt rescheduled to July 6th @ 1315.  Pt agreed to appt and did not have any other questions.    Verdell Carmine, RN 06/24/2019  2:32 PM

## 2019-06-28 ENCOUNTER — Other Ambulatory Visit: Payer: Self-pay

## 2019-06-28 ENCOUNTER — Telehealth (INDEPENDENT_AMBULATORY_CARE_PROVIDER_SITE_OTHER): Payer: Medicaid Other

## 2019-06-28 DIAGNOSIS — Z3490 Encounter for supervision of normal pregnancy, unspecified, unspecified trimester: Secondary | ICD-10-CM

## 2019-06-28 HISTORY — DX: Encounter for supervision of normal pregnancy, unspecified, unspecified trimester: Z34.90

## 2019-06-28 MED ORDER — AMBULATORY NON FORMULARY MEDICATION: 1.0000 | 0 refills | Status: DC | PRN

## 2019-06-28 NOTE — Progress Notes (Signed)
I connected with  [name] on 06/28/19 at  1:15 PM EDT by telephone and verified that I am speaking with the correct person using two identifiers.   I discussed the limitations, risks, security and privacy concerns of performing an evaluation and management service by telephone and the availability of in person appointments. I also discussed with the patient that there may be a patient responsible charge related to this service. The patient expressed understanding and agreed to proceed.  Pt hx obtained.  Pt reports that the other nausea medications is not effective because it would vomit once she would drink water with the pill.  Informed pt that I would f/u in regards to prior auth for patch.  Pt informed that she will receive a blood pressure cuff to her home from [place].  I explained to the pt to please let us know if she does not receive the bp cuff by 07/05/19 so that we can f/u with the pharmacy so that she can have it for her appt on 07/08/19 so that we can explain to her how to use it.  Pt agreed.  Pt informed that at her 07/08/19 appt we will do a urine culture, test for GC/CH, and a OB panel.  I also informed pt that we offer her genetic screening however it takes approximately two weeks for results and that we do not have give the gender over the phone nor via [place].  Attempted to get pt logged into [employer] however the site was not working.  I advised the pt that we would get her set up at her appt on 07/08/19 along with setting her up with MyChart as that most of her visits will be virtual.  Pt verbalized understanding.  Pt reports feeling like she has BV due to discomfort that she is feeling inside her vagina.  "I feel this discomfort whenever I have BV."  I confirmed with pt if she wanted to come before her provider appt to be tested for BV.  Pt stated that she will just wait until her appt because she felt that it was not urgent. Pt questions and concerns addressed and advised  that if thought of anything else to send MyChart message or call the office.  Pt verbalized understanding. [place] office to fax info to [facility] for BP cuff request.     Verdell Carmine, RN 06/28/2019  1:20 PM

## 2019-06-28 NOTE — Progress Notes (Signed)
I have reviewed this chart and agree with the RN/CMA assessment and management.    Lamont Tant C Jeremie Abdelaziz, MD, FACOG Attending Physician, Faculty Practice [facility]  

## 2019-06-30 NOTE — Progress Notes (Signed)
I have reviewed this chart and agree with the RN/CMA assessment and management.    Theodoro Koval C Tomoya Ringwald, MD, FACOG Attending Physician, Faculty Practice [facility]  

## 2019-07-05 DIAGNOSIS — Z3490 Encounter for supervision of normal pregnancy, unspecified, unspecified trimester: Secondary | ICD-10-CM | POA: Diagnosis not present

## 2019-07-08 ENCOUNTER — Other Ambulatory Visit (HOSPITAL_COMMUNITY)
Admission: RE | Admit: 2019-07-08 | Discharge: 2019-07-08 | Disposition: A | Discharge status: Home / Self Care | Payer: Medicaid Other | Source: Ambulatory Visit | Attending: Obstetrics & Gynecology | Admitting: Obstetrics & Gynecology

## 2019-07-08 ENCOUNTER — Encounter: Payer: Self-pay | Admitting: *Deleted

## 2019-07-08 ENCOUNTER — Ambulatory Visit (INDEPENDENT_AMBULATORY_CARE_PROVIDER_SITE_OTHER): Payer: Medicaid Other | Admitting: Obstetrics & Gynecology

## 2019-07-08 ENCOUNTER — Other Ambulatory Visit: Payer: Self-pay

## 2019-07-08 VITALS — BP 123/80 | HR 87 | Temp 97.9°F | Wt 125.0 lb

## 2019-07-08 DIAGNOSIS — E079 Disorder of thyroid, unspecified: Secondary | ICD-10-CM

## 2019-07-08 DIAGNOSIS — Z3491 Encounter for supervision of normal pregnancy, unspecified, first trimester: Secondary | ICD-10-CM

## 2019-07-08 DIAGNOSIS — O99281 Endocrine, nutritional and metabolic diseases complicating pregnancy, first trimester: Secondary | ICD-10-CM

## 2019-07-08 DIAGNOSIS — Z3A12 12 weeks gestation of pregnancy: Secondary | ICD-10-CM

## 2019-07-08 NOTE — Progress Notes (Signed)
  Subjective:    Angela Branch is being seen today for her first obstetrical visit.  This is not a planned pregnancy. She is at [redacted]w[redacted]d gestation. Her obstetrical history is significant for short interval between pregnancies. Relationship with FOB: significant other, living together. Patient does intend to breast feed. Pregnancy history fully reviewed.  Patient reports nausea improving, she never had to take meds.  Review of Systems:   Review of Systems  Objective:     BP 123/80   Pulse 87   Temp 97.9 F (36.6 C)   Wt 125 lb (56.7 kg)   LMP 04/12/2019   BMI 22.86 kg/m  Physical Exam  Exam Breathing, conversing, and ambulating normally Heart- rrr Lungs- CTAB Abd- benign  Assessment:    Pregnancy: [id number] Patient Active Problem List   Diagnosis Date Noted  . Supervision of low-risk pregnancy 06/28/2019  . Fetal tachycardia affecting management of mother 09/21/2018  . 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 Thyroid disease 03/12/2018  . Adjustment disorder with mixed anxiety and depressed mood 04/29/2016  . GERD (gastroesophageal reflux disease) 09/21/2015  . Eczema 10/04/2014  . Asthma 06/12/2012       Plan:     Initial labs drawn. Prenatal vitamins. Problem list reviewed and updated. Ultrasound in pregnancy discussed; fetal survey: Sept 1.  Amniocentesis discussed: not indicated. She already has [employer] and exercise psychologist, but had problems with it. Has [employer]cripts downloaded BP cuff ordered Next visit 4 weeks virtual   Emily Filbert 07/08/2019

## 2019-07-08 NOTE — Addendum Note (Signed)
Addended by: Fidela Juneau A on: 07/08/2019 10:50 AM   Modules accepted: Orders

## 2019-07-09 LAB — OBSTETRIC PANEL, INCLUDING HIV
Antibody Screen: NEGATIVE
Basophils Absolute: 0 10*3/uL (ref 0.0–0.2)
Basos: 0 %
EOS (ABSOLUTE): 0.2 10*3/uL (ref 0.0–0.4)
Eos: 3 %
HIV Screen 4th Generation wRfx: NONREACTIVE
Hematocrit: 39.2 % (ref 34.0–46.6)
Hemoglobin: 13.5 g/dL (ref 11.1–15.9)
Hepatitis B Surface Ag: NEGATIVE
Immature Grans (Abs): 0 10*3/uL (ref 0.0–0.1)
Immature Granulocytes: 0 %
Lymphocytes Absolute: 2.1 10*3/uL (ref 0.7–3.1)
Lymphs: 26 %
MCH: 30.5 pg (ref 26.6–33.0)
MCHC: 34.4 g/dL (ref 31.5–35.7)
MCV: 89 fL (ref 79–97)
Monocytes Absolute: 0.5 10*3/uL (ref 0.1–0.9)
Monocytes: 6 %
Neutrophils Absolute: 5.2 10*3/uL (ref 1.4–7.0)
Neutrophils: 65 %
Platelets: 212 10*3/uL (ref 150–450)
RBC: 4.43 x10E6/uL (ref 3.77–5.28)
RDW: 12.7 % (ref 11.7–15.4)
RPR Ser Ql: NONREACTIVE
Rh Factor: POSITIVE
Rubella Antibodies, IGG: 1.41 index (ref >0.99)
WBC: 8 10*3/uL (ref 3.4–10.8)

## 2019-07-09 LAB — T3, FREE: T3, Free: 3.7 pg/mL (ref 2.0–4.4)

## 2019-07-09 LAB — T4, FREE: Free T4: 1.64 ng/dL (ref 0.82–1.77)

## 2019-07-09 LAB — TSH: TSH: 0.007 u[IU]/mL — ABNORMAL LOW (ref 0.450–4.500)

## 2019-07-11 LAB — CULTURE, OB URINE

## 2019-07-11 LAB — URINE CULTURE, OB REFLEX

## 2019-07-12 LAB — CERVICOVAGINAL ANCILLARY ONLY
Bacterial vaginitis: NEGATIVE
Candida vaginitis: NEGATIVE
Chlamydia: NEGATIVE
Neisseria Gonorrhea: NEGATIVE
Trichomonas: NEGATIVE

## 2019-07-15 ENCOUNTER — Telehealth: Payer: Self-pay | Admitting: Lactation Services

## 2019-07-15 NOTE — Telephone Encounter (Signed)
Pt called and left message on Nurse Phone line. She reports she would like to know the results of her Vaginal Swab that was performed.   Called pt back and LM for her to call the office for the results.

## 2019-07-23 ENCOUNTER — Encounter: Payer: Self-pay | Admitting: *Deleted

## 2019-08-03 ENCOUNTER — Telehealth: Payer: Self-pay | Admitting: Emergency Medicine

## 2019-08-03 DIAGNOSIS — Z3480 Encounter for supervision of other normal pregnancy, unspecified trimester: Secondary | ICD-10-CM

## 2019-08-03 NOTE — Telephone Encounter (Signed)
Called and left a voicemail for pt informing her of the appointment scheduled with MFM for genetic counseling. Pt was provided with the date and time and instructed to call if she had any questions or concerns regarding her appointment.

## 2019-08-03 NOTE — Telephone Encounter (Signed)
Pt called after receiving voicemail with appointment details and asked for clarification regarding the need for the appointment. It was thoroughly explained to the patient that the provider at the genetic counseling appointment would be able to provide her with the questions regarding her results. The pt verbalized understanding and had no further questions.

## 2019-08-04 ENCOUNTER — Encounter: Payer: Self-pay | Admitting: Obstetrics & Gynecology

## 2019-08-04 ENCOUNTER — Telehealth: Payer: Self-pay | Admitting: Obstetrics and Gynecology

## 2019-08-04 DIAGNOSIS — Z148 Genetic carrier of other disease: Secondary | ICD-10-CM | POA: Insufficient documentation

## 2019-08-04 HISTORY — DX: Genetic carrier of other disease: Z14.8

## 2019-08-04 NOTE — Telephone Encounter (Signed)
Attempted to contact patient about her appointment on 8/13 @ 1:15. No answer, left voicemail instructing patient that the appointment is a virtual visit. Patient instructed to download the app if not already done so.

## 2019-08-05 ENCOUNTER — Telehealth (INDEPENDENT_AMBULATORY_CARE_PROVIDER_SITE_OTHER): Payer: Medicaid Other | Admitting: Obstetrics and Gynecology

## 2019-08-05 ENCOUNTER — Other Ambulatory Visit: Payer: Self-pay

## 2019-08-05 DIAGNOSIS — Z148 Genetic carrier of other disease: Secondary | ICD-10-CM

## 2019-08-05 DIAGNOSIS — Z3491 Encounter for supervision of normal pregnancy, unspecified, first trimester: Secondary | ICD-10-CM

## 2019-08-05 DIAGNOSIS — Z3A16 16 weeks gestation of pregnancy: Secondary | ICD-10-CM

## 2019-08-05 NOTE — Progress Notes (Signed)
I connected with  Angela Branch on 08/05/19 at  1:15 PM EDT by telephone and verified that I am speaking with the correct person using two identifiers.   I discussed the limitations, risks, security and privacy concerns of performing an evaluation and management service by telephone and the availability of in person appointments. I also discussed with the patient that there may be a patient responsible charge related to this service. The patient expressed understanding and agreed to proceed.  [place], CMA 08/05/2019  1:16 PM

## 2019-08-05 NOTE — Progress Notes (Signed)
   TELEHEALTH VIRTUAL OBSTETRICS VISIT ENCOUNTER NOTE  I connected with Angela Branch on 08/05/19 at  1:15 PM EDT by telephone at home and verified that I am speaking with the correct person using two identifiers.   I discussed the limitations, risks, security and privacy concerns of performing an evaluation and management service by telephone and the availability of in person appointments. I also discussed with the patient that there may be a patient responsible charge related to this service. The patient expressed understanding and agreed to proceed.  Subjective:  Angela Branch is a 23 y.o. [phone] at [redacted]w[redacted]d being followed for ongoing prenatal care.  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  Patient reports no complaints. Reports fetal movement. Denies any contractions, bleeding or leaking of fluid.   The following portions of the patient's history were reviewed and updated as appropriate: allergies, current medications, past family history, past medical history, past social history, past surgical history and problem list.   Objective:   General:  Alert, oriented and cooperative.   Mental Status: Normal mood and affect perceived. Normal judgment and thought content.  Rest of physical exam deferred due to type of encounter  Assessment and Plan:  Pregnancy: [id number] at [redacted]w[redacted]d 1. 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 Encounter for supervision of low-risk pregnancy in first trimester  BP today 102/70 HR 84  Preterm labor symptoms and general obstetric precautions including but not limited to  vaginal bleeding, contractions, leaking of fluid and fetal movement were reviewed in detail with the patient.  I discussed the assessment and treatment plan with the patient. The patient was provided an opportunity to ask questions and all were answered. The patient agreed with the plan and demonstrated an understanding of the instructions. The patient was advised to call back or seek an in-person office evaluation/go to [facility] at [facility] for any urgent or concerning symptoms. Please refer to After Visit Summary for other counseling recommendations.   I provided 12 minutes of non-face-to-face time during this encounter.  No follow-ups on file.  Future Appointments  Date Time Provider [place]  08/10/2019  1:00 PM [place] GENETIC COUNSELING [facility] [place] [facility]  08/25/2019  1:30 PM [name] 1 [facility]    Noni Saupe, NP [facility] [employer], [place]

## 2019-08-10 ENCOUNTER — Ambulatory Visit (HOSPITAL_COMMUNITY): Payer: Medicaid Other

## 2019-08-13 ENCOUNTER — Other Ambulatory Visit: Payer: Self-pay | Admitting: Lactation Services

## 2019-08-13 DIAGNOSIS — Z148 Genetic carrier of other disease: Secondary | ICD-10-CM

## 2019-08-13 NOTE — Progress Notes (Signed)
[name] order changed to Detailed with Anatomy Scan per Dr. Hulan Fray.

## 2019-08-25 ENCOUNTER — Ambulatory Visit (HOSPITAL_COMMUNITY): Payer: Self-pay | Admitting: Genetic Counselor

## 2019-08-25 ENCOUNTER — Other Ambulatory Visit: Payer: Self-pay

## 2019-08-25 ENCOUNTER — Ambulatory Visit (HOSPITAL_BASED_OUTPATIENT_CLINIC_OR_DEPARTMENT_OTHER): Payer: Medicaid Other | Admitting: Genetic Counselor

## 2019-08-25 ENCOUNTER — Encounter (HOSPITAL_COMMUNITY): Payer: Self-pay

## 2019-08-25 ENCOUNTER — Ambulatory Visit (HOSPITAL_COMMUNITY): Payer: Medicaid Other | Admitting: *Deleted

## 2019-08-25 ENCOUNTER — Ambulatory Visit (HOSPITAL_COMMUNITY)
Admission: RE | Admit: 2019-08-25 | Discharge: 2019-08-25 | Disposition: A | Discharge status: Home / Self Care | Payer: Medicaid Other | Source: Ambulatory Visit | Attending: Obstetrics and Gynecology | Admitting: Obstetrics and Gynecology

## 2019-08-25 DIAGNOSIS — Z148 Genetic carrier of other disease: Secondary | ICD-10-CM | POA: Insufficient documentation

## 2019-08-25 DIAGNOSIS — Z315 Encounter for genetic counseling: Secondary | ICD-10-CM | POA: Diagnosis not present

## 2019-08-25 DIAGNOSIS — Z820 Family history of epilepsy and other diseases of the nervous system: Secondary | ICD-10-CM | POA: Diagnosis not present

## 2019-08-25 DIAGNOSIS — E079 Disorder of thyroid, unspecified: Secondary | ICD-10-CM

## 2019-08-25 DIAGNOSIS — O99282 Endocrine, nutritional and metabolic diseases complicating pregnancy, second trimester: Secondary | ICD-10-CM

## 2019-08-25 DIAGNOSIS — Z3A18 18 weeks gestation of pregnancy: Secondary | ICD-10-CM

## 2019-08-25 DIAGNOSIS — O09892 Supervision of other high risk pregnancies, second trimester: Secondary | ICD-10-CM | POA: Diagnosis not present

## 2019-08-25 NOTE — Progress Notes (Signed)
08/25/2019  CALYPSO HAGARTY 07/30/1996 MRN: [id number] DOV: 08/25/2019  Ms. 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 Ms. Gell came to her appointment alone due to COVID-19 visitor restrictions.   Indication for genetic counseling - Carrier for Duchenne/Becker muscular dystrophy  Prenatal history  Ms. Hornbaker is a [id number], 23 y.o. female. Her current pregnancy has completed [redacted]w[redacted]d (Estimated Date of Delivery: 01/26/20).  Ms. Busick denied exposure to environmental toxins or chemical agents. She denied the use of alcohol, tobacco or street drugs. She reported using marijuana during the pregnancy to relieve her extreme nausea. She also reports taking Promethazine. She denied significant viral illnesses and fevers during the course of her pregnancy. She did report experiencing a subchorionic hemorrhage in the first trimester. Her medical and surgical histories were noncontributory.  We discussed that the effects of marijuana on pregnancies are still largely unknown. It is difficult to study marijuana use during pregnancy for many reasons. While most studies have been reassuring regarding birth defects, others have demonstrated that there may be an increased chance for pregnancy complications, problems with placental function, and temporary postnatal withdrawal symptoms in children exposed to marijuana prenatally. Studies are conflicting regarding the risk for problems such as impaired executive functioning, impulsivity, hyperactivity, aggression, depression, and anxiety in children prenatally exposed to marijuana. For this reason, it is recommended to avoid marijuana use during pregnancy.  Family History  A three generation pedigree was drafted and reviewed. The family history is remarkable for the following:  - Ms. Nuncio has a 70 year old son with speech delay and an 51 month  old daughter with respiratory difficulties such as wheezing. She did not report any motor difficulties for her son. She desires a genetics workup for her children in light of the results from her carrier screening and has already contacted Medicaid to get a referral set up. See Discussion section for more details.  - Ms. Boerema paternal grandmother's brother reportedly has muscular dystrophy. Ms. Gupta was not sure of the specific subtype of muscular dystrophy he is affected by. He is in his 19s or 29s and now has to use a wheelchair. We discussed that it is possible that this individual could have a mild form of Becker muscular dystrophy (BMD). If this is the case, it is possible that her paternal grandmother is a carrier for the condition, which then may have been passed on to Ms. Siegmann father and Ms. Khatoon herself. However, Ms. Woodson father reportedly does not have any muscle weakness or other symptoms of BMD. It would be expected that Ms. Cabana father would be affected by the condition if he inherited BMD from his mother. However, it is also possible that he could still be too young to demonstrate more pronounced symptoms of the condition. For this reason, it is difficult to determine if the family history of muscular dystrophy is due to the same gene change that Ms. Jenkinson carries or if it is unrelated. See Discussion section for more details.  - Ms. Risse maternal aunt has a son with learning disabilities who was exposed to alcohol and potentially other substances in utero. We discussed that prenatal exposure to alcohol and other substances can increase the risk for several complications in children, including learning disabilities. If this child's learning disability is attributed to these teratogens and is not part of a genetic syndrome, then the risk for Ms. Frickey to have a  child with learning disabilities is not increased over population risk.  - Ms. Depaula partner has a  daughter from a previous relationship who has what sounds like craniosynostosis, as Ms. Wulf indicated that her "skull bones closed too soon". This child is also reportedly "allergic to milk" in such a way that she projectile vomits after ingesting it. Per Ms. Logue, this child has not been seen by genetics. We discussed that craniosynostosis can be isolated, multifactorial, or part of a genetic condition. Thus, precise risk assessment for craniosynostosis in Ms. Kenney children with her current partner is limited.  - Ms. Reddick partner has two maternal half brothers who have mental illness. We discussed that mental illness is often multifactorial in nature, occurring due to a combination of environmental, lifestyle, and genetic factors. Because of the multifactorial nature of mental illness, recurrence risks are not well-established. However, mental illness can appear to run in families; thus, there is a chance that the couple's children could also have mental illness.  The remaining family histories were reviewed and found to be noncontributory for birth defects, intellectual disability, recurrent pregnancy loss, and known genetic conditions. Ms. Fleury had limited information about her partner's extended family history; thus, risk assessment was limited.  The patient's ethnicity is [place], [place], [place], and Native [place]. The father of the pregnancy's ethnicity is [place] American, Caucasian, and Native Optometrist. Ashkenazi Jewish ancestry and consanguinity were denied. Pedigree will be scanned under Media.  Discussion  Ms. Pankowski had [id number] carrier screening performed through [place]. 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   Muscular dystrophies are a group of genetic conditions characterized by progressive muscle weakness and atrophy. Both DMD and BMD are specific forms of  muscular dystrophy referred to as dystrophinopathies. DMD and BMD primarily affect skeletal muscles used for movement as well as cardiac muscle. Both conditions have similar signs and symptoms but differ in their severity, age of onset, and rate of progression.   DMD is associated with childhood onset of symptoms, generally before the age of 78 years. Children with DMD may have delayed motor skills and muscle weakness resulting in a waddling gait and difficulty climbing stairs, jumping, running, or standing from a squat. Children with DMD may also have learning disabilities, autism, or ADHD. Generally, individuals with DMD require wheelchair use by 22 years of age. Most individuals with DMD experience cardiomyopathy, a condition that makes it harder for the heart to pump blood to the rest of the body, by 22 years of age. Individuals with DMD often die in their 26s and seldom survive beyond 22 years of age due to respiratory complications and cardiomyopathy.   BMD is associated with later onset of symptoms than DMD. Some individuals with BMD begin to show symptoms of the condition in childhood whereas others present in adolescence. Common symptoms of BMD include skeletal muscle weakness, activity-induced muscle cramping, elbow contractures, and dilated cardiomyopathy. BMD worsens at a slower rate than DMD. On average, individuals with BMD require wheelchair use by 22 years of age and begin experiencing cardiomyopathy in their late 52s. The mean age of death for individuals with BMD is in the mid-40s, though some mildly affected individuals can survive beyond this age.  DMD and BMD are both caused by pathogenic variants in the DMD gene on the X chromosome. The DMD gene provides instructions for the creation of the protein dystrophin, which helps stabilize and protect muscle fibers  in skeletal and cardiac muscle. Pathogenic variants in the DMD gene alter the structure or function of dystrophin or prevent  functional dystrophin from being produced. As the skeletal and cardiac muscles contract and relax with use in the body, muscle fibers become damaged and weaken and die over time in individuals with DMD/BMD. This leads to the muscle weakness and cardiac problems characteristic of DMD and BMD. BMD generally results when dystrophin retains some function, whereas DMD generally results when there is a lack of any functional dystrophin produced. 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 This duplication has been reported in patients with both DMD and BMD.  Given that the DMD gene is located on the X chromosome, DMD and BMD are inherited in an X-linked pattern. It is expected thatfemaleshave two copies of the DMD gene, with one copy on each X chromosome. Since males only have one X chromosome, they only have one copy of the DMD gene. If males inherit a pathogenic variant in the DMD gene, that one mutation is enough to cause DMD/BMD. Females may also inherit a pathogenic variant in the DMD gene and be affected by DMD/BMD; however, females are often less severely affected by the condition than males since they generally have an additional normal copy of the DMD gene that mitigates the effects of the pathogenic variant. With each pregnancy, females carriers have a 50% chance of passing on a pathogenic variant in the DMD gene to their child. Female children of carriers have a 1 in 2 (50%) chance of being affected, while female children of carriers have a 1 in 2 (50%) chance of being a carrier themselves. Affected males will always have daughters who are carriers and sons who are unaffected.   Ms. Coote was encouraged to discuss her DMD/BMD carrier status with her siblings. It is recommended that these family members undergo DMD carrier screening. If Ms. Thoreson DMD gene change was inherited from her mother, her maternal half brothers could possibly be affected with BMD later in  life. If her sisters are carriers, they would be at risk of having a child affected by DMD/BMD. Carrier sisters may also be at risk for skeletal and cardiac complications associated with being a carrier.    Female carries of DMD/BMD may exhibit symptoms of the condition, such as muscle weakness or cardiomyopathy. These individuals are called "manifesting carriers". Female carriers are thought to demonstrate symptoms of the condition due to problems with X inactivation. X inactivation is a natural process in which cells in a female's body inactivate or silence one of their two X chromosomes. This compensates for the extra dose of X chromosome genes that females have compared to their female counterparts with only one X chromosome. Generally, when females are carriers for DMD/BMD, most cells in their bodies inactivate the X chromosome with the DMD gene mutation. However, skewed X inactivation can occur when cells in the body inactivate the normal X chromosome instead of the X chromosome with the mutation. These individuals may then demonstrate symptoms associated with DMD/BMD. For this reason, it is recommended that female carriers be followed by healthcare providers familiar with DMD/BMD. We discussed that Ms. Carton should notify her primary care provider if she begins experiencing any muscle weakness. She should also undergo a cardiac evaluation during the current pregnancy and at least every 5 years after, as 10-50% of female DMD/BMD carriers have cardiac abnormalities. Given that Ms. Miler reports having syncopal episodes and palpitations  during this pregnancy, it is recommended that she be referred for a cardiac workup as soon as possible.    Ms. Pangilinan carrier screening was negative for the other 13 conditions screened. Thus, her risk to be a carrier for these additional conditions (listed separately in the laboratory report) has been reduced but not eliminated.   Ms. Payer was also counseled  regarding diagnostic testing via amniocentesis. We discussed the technical aspects of the procedure and quoted up to a 1 in 500 (0.2%) risk for spontaneous pregnancy loss or other adverse pregnancy outcomes as a result of amniocentesis. Cultured cells from an amniocentesis sample allow for the visualization of a fetal karyotype, which can detect >99% of chromosomal aberrations. Chromosomal microarray can also be performed to identify smaller deletions or duplications of fetal chromosomal material. Amniocentesis could also be performed to assess whether the baby is affected by DMD/BMD. After careful consideration, Ms. Muhlestein declined amniocentesis at this time, instead opting for postnatal testing. She understands that amniocentesis is available at any point after 16 weeks of pregnancy and that she may opt to undergo the procedure at a later date should she change her mind.  Ms.Jenkinswas informed abouttheEarly Checkresearch studyto add DMD to her son's newborn screening panel. She was provided with an information sheet containing instructions for signing up for the research study and was informed that the research study is free of charge to participate in. This would provide an answer as to whether or not her son is affected by DMD/BMD just days after birth. Early diagnosis could assist in early intervention and possibly early treatment for the condition. Ms. Rozenberg indicated that she will likely sign up for this research study.  We also reviewed that Ms. Lukin had Panorama NIPS through [facility] that was low-risk for fetal aneuploidies. We reviewed that these results showed a less than 1 in 10,000 risk for trisomies 21, 18 and 13, and monosomy X (Turner syndrome).  In addition, the risk for triploidy and sex chromosome trisomies (47,XXX and 47,XXY) was also low. Ms. Stallman elected to have cffDNA analysis for 22q11 deletion syndrome, which was also low risk (1 in 9000). We reviewed that while this  testing identifies > 99% of pregnancies with trisomy 69, trisomy 21, sex chromosome trisomies (47,XXX and 47,XXY), and triploidy, it is NOT diagnostic. A positive test result requires confirmation by CVS or amniocentesis, and a negative test result does not rule out a fetal chromosome abnormality. She also understands that this testing does not identify all genetic conditions.  A complete ultrasound was performed today prior to our visit. The ultrasound report will be sent under separate cover. An echogenic intracardiac focus (EIF) and bilateral choroid plexus cysts (CPCs) were identified on ultrasound. These findings are considered to be a normal variant in the context of low risk NIPS results.There were no other visualized fetal anomalies or markers suggestive of aneuploidy. Ms. Lennartz understands that screening tests, including ultrasound, cannot rule out all birth defects or genetic syndromes.  Lastly, the patient was made aware that screening for open neural tube defects (ONTDs) via MS-AFP in the second trimester in addition to level II ultrasound examination is recommended. We reviewed that Ms. Blok level II ultrasound did not detect any ONTDs, and that level II ultrasound is able to detect them with 90-95% sensitivity. However, normal results from any of the above options do not guarantee a normal baby, as 3-5% of newborns have some type of birth defect, many of which are not prenatally diagnosable.  Additional screening and diagnostic testing were declined today. She understands that screening tests, including ultrasound, cannot rule out all birth defects or genetic syndromes. The patient was advised of this limitation and states she still does not want additional testing or screening at this time.   Ms. Fulop was provided with a variety of resources, including the GeneReviews entry on DMD and BMD, information on being a carrier from [employer] Muscular Dystrophy, and information on care  considerations for carriers from [employer]cular [employer].   I counseled Ms. Cullimore regarding the above risks and available options. The approximate face-to-face time with the genetic counselor was 45 minutes.  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

## 2019-09-02 ENCOUNTER — Telehealth: Payer: Medicaid Other | Admitting: Obstetrics and Gynecology

## 2019-09-04 IMAGING — US OBSTETRIC <14 WK ULTRASOUND
1 series · 15 of 28 positions shown · non-contrast
Comparison: 06/03/2019

CLINICAL DATA: Follow-up subchorionic hemorrhage of placenta in 1st
trimester. Current assigned gestational age of 8 weeks 5 days by
prior ultrasound.

EXAM:
OBSTETRIC <14 WK US AND TRANSVAGINAL OB US
TECHNIQUE: Both transabdominal and transvaginal ultrasound examinations were
performed for complete evaluation of the gestation as well as the
maternal uterus, adnexal regions, and pelvic cul-de-sac.
Transvaginal technique was performed to assess early pregnancy.

[Series 1: obstetric <14 wk ultrasound · 49 acquisitions, 15 frames shown]
[im 1/49]
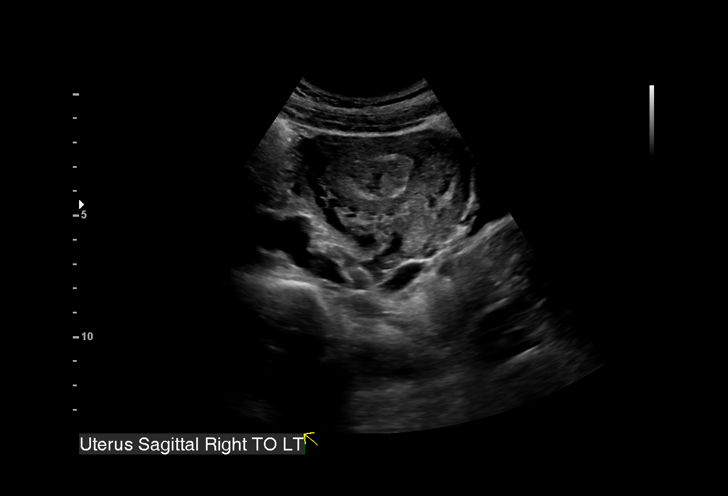
[im 4/49]
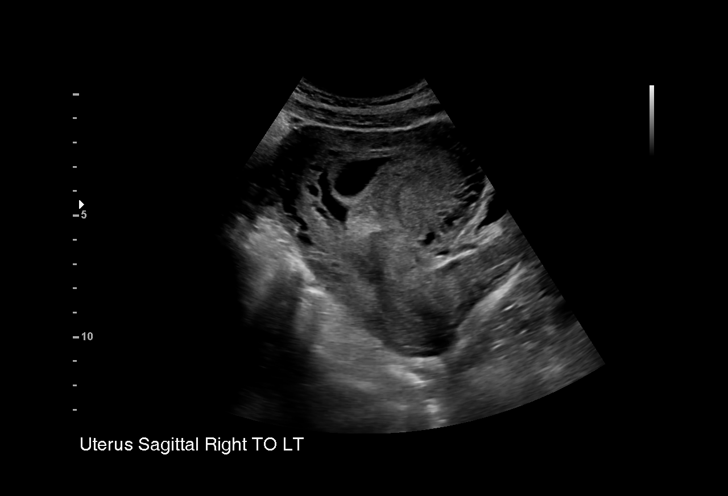
[im 8/49]
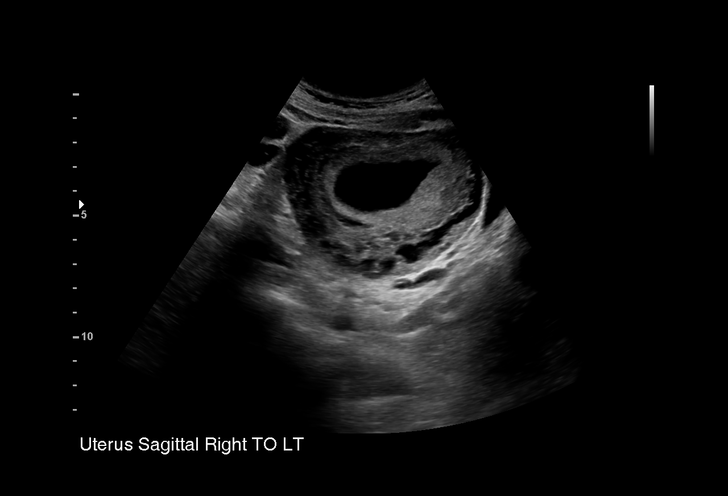
[im 11/49]
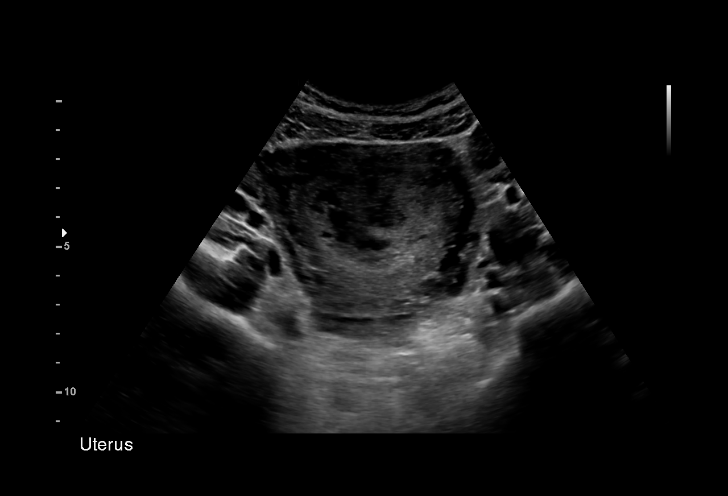
[im 15/49]
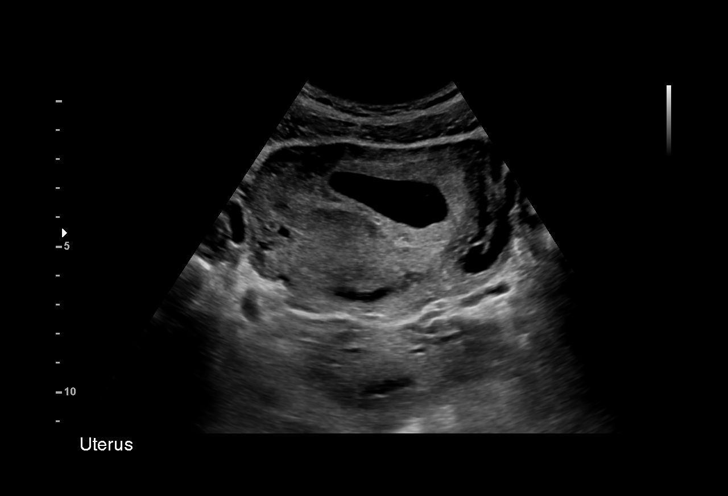
[im 18/49]
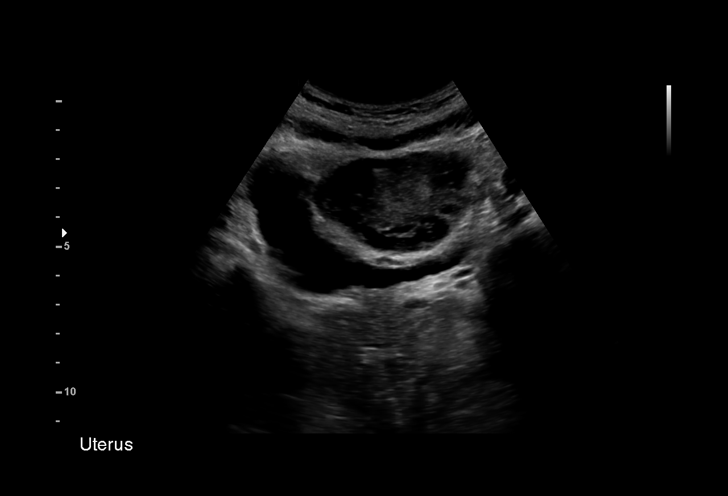
[im 22/49]
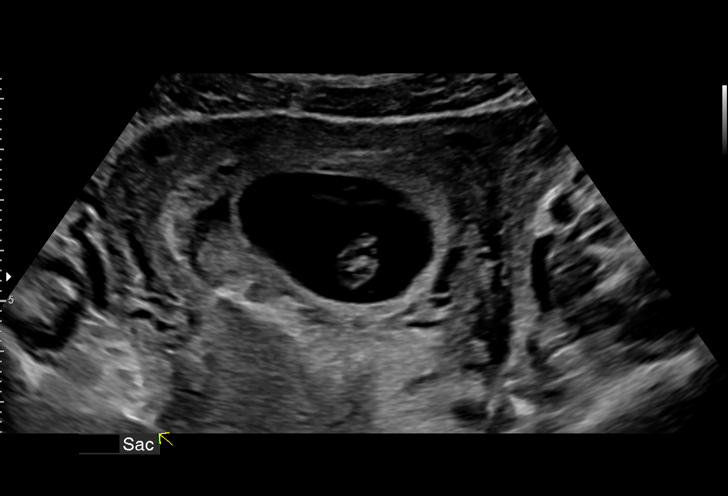
[im 25/49]
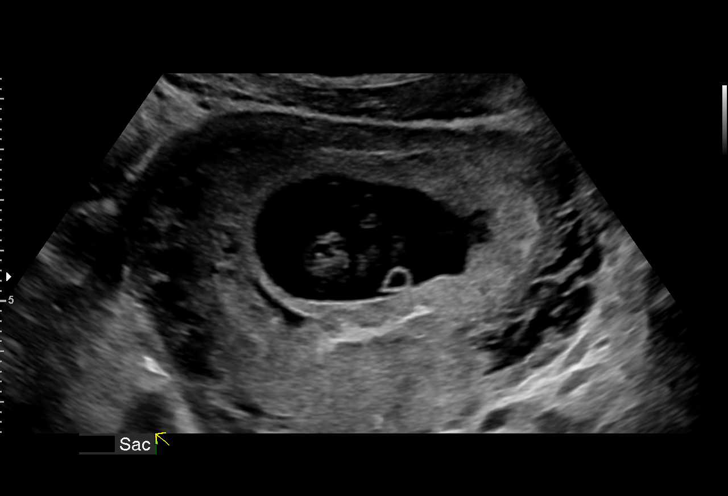
[im 27/49]
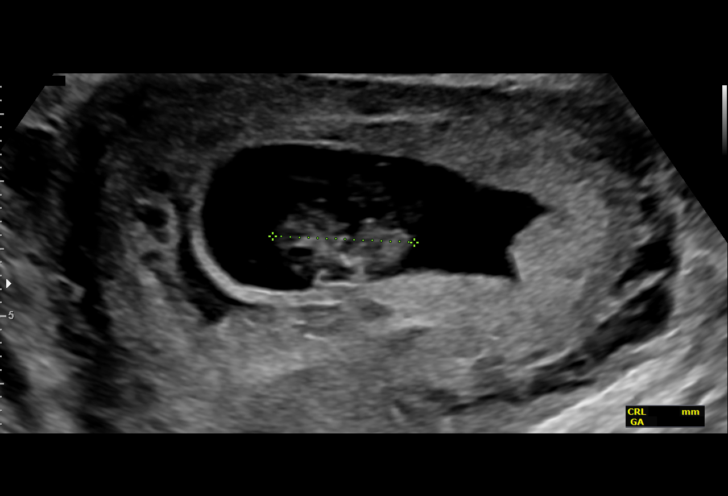
[im 31/49]
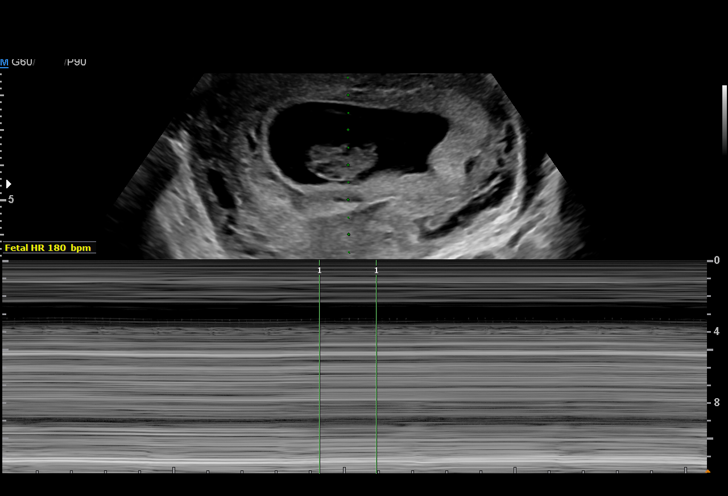
[im 34/49]
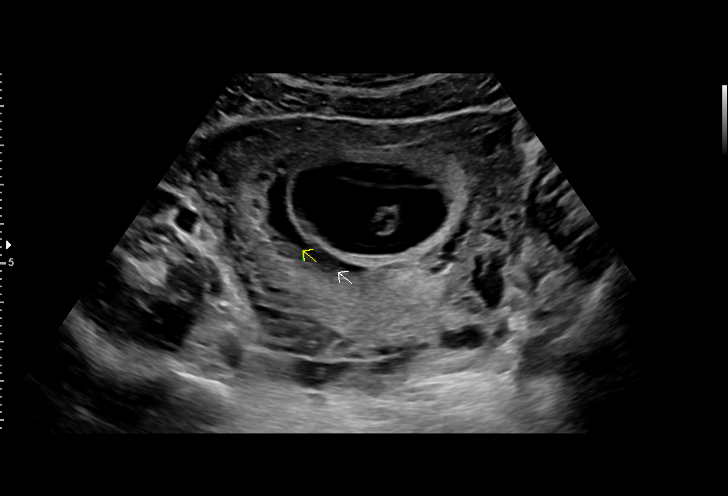
[im 38/49]
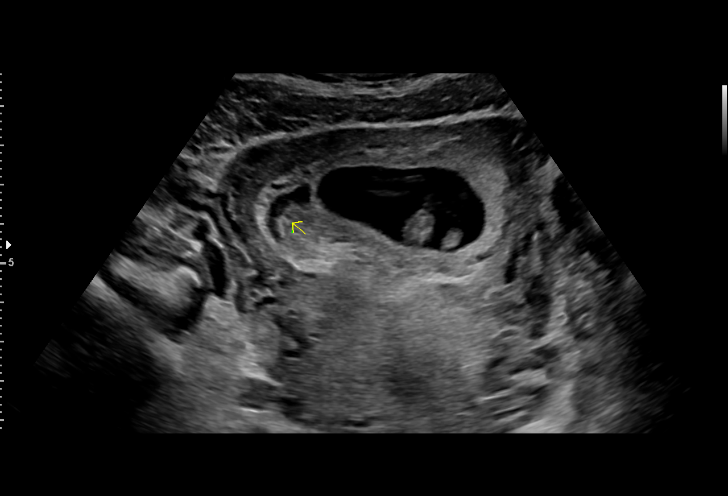
[im 41/49]
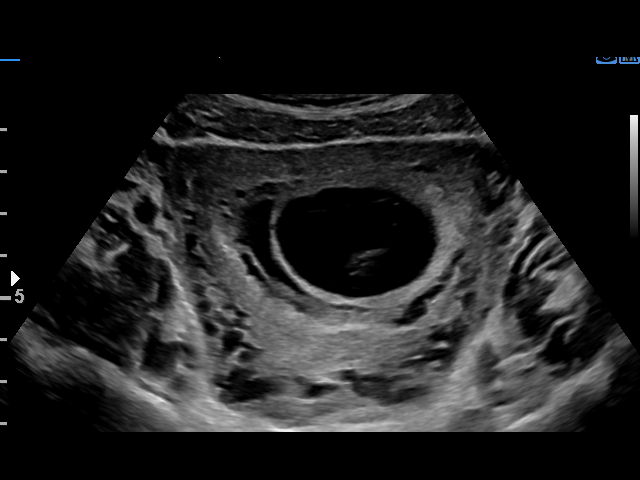
[im 45/49]
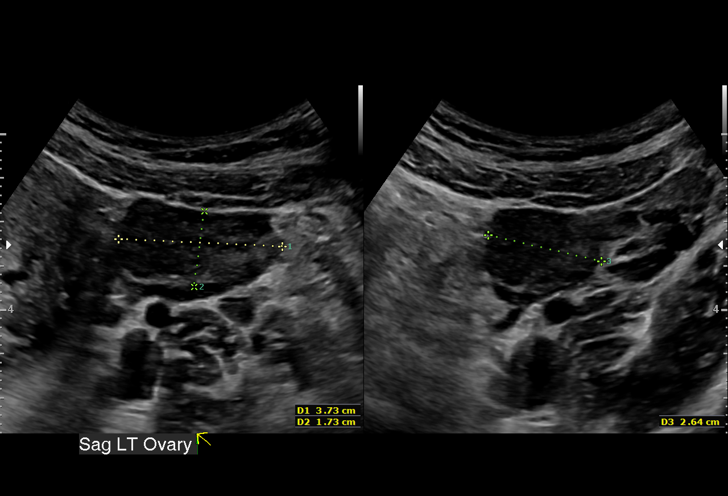
[im 49/49]
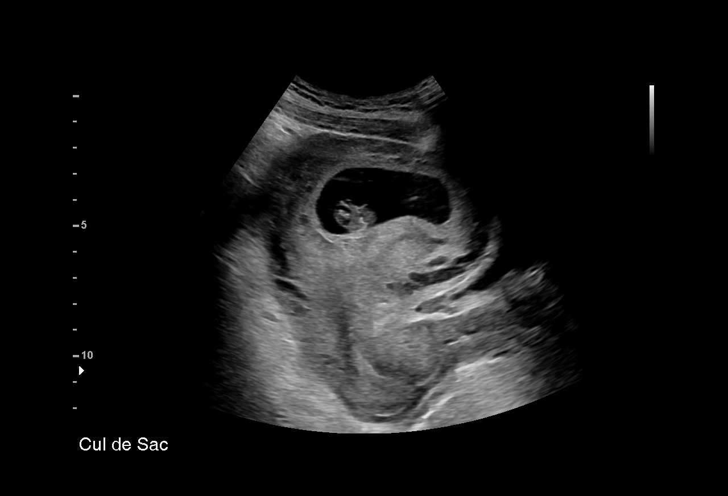

[15 of 28 positions shown; findings below may reference images not displayed]

FINDINGS: Intrauterine gestational sac: Single

Yolk sac:  Visualized.

Embryo:  Visualized.

Cardiac Activity: Visualized.

Heart Rate: 181 bpm

CRL: 21 mm   8 w   5 d                  US EDC: 01/26/2020

Subchorionic hemorrhage:  Old small subchorionic hemorrhage noted.

Maternal uterus/adnexae: Both ovaries are normal in appearance. No
mass or abnormal free fluid identified.
IMPRESSION: Single living IUP with assigned gestational age of 8 weeks 5 days by
prior ultrasound. Appropriate fetal growth.

Old small subchorionic hemorrhage noted.  No acute findings.

## 2019-09-08 ENCOUNTER — Telehealth: Payer: Self-pay | Admitting: Obstetrics & Gynecology

## 2019-09-08 NOTE — Telephone Encounter (Signed)
Called the patient to inform of the upcoming mychart visit. Left a message Informing the patient of expecting a phone call from our office with information of when to log in.

## 2019-09-09 ENCOUNTER — Telehealth (INDEPENDENT_AMBULATORY_CARE_PROVIDER_SITE_OTHER): Payer: Medicaid Other | Admitting: Obstetrics and Gynecology

## 2019-09-09 ENCOUNTER — Other Ambulatory Visit: Payer: Self-pay

## 2019-09-09 DIAGNOSIS — B009 Herpesviral infection, unspecified: Secondary | ICD-10-CM

## 2019-09-09 DIAGNOSIS — E079 Disorder of thyroid, unspecified: Secondary | ICD-10-CM

## 2019-09-09 DIAGNOSIS — Z3A20 20 weeks gestation of pregnancy: Secondary | ICD-10-CM | POA: Diagnosis not present

## 2019-09-09 DIAGNOSIS — Z3492 Encounter for supervision of normal pregnancy, unspecified, second trimester: Secondary | ICD-10-CM

## 2019-09-09 DIAGNOSIS — Z148 Genetic carrier of other disease: Secondary | ICD-10-CM

## 2019-09-09 DIAGNOSIS — Z3490 Encounter for supervision of normal pregnancy, unspecified, unspecified trimester: Secondary | ICD-10-CM

## 2019-09-09 NOTE — Progress Notes (Signed)
Pt states does not want to do visit via My Chart, wants to do tele visit. Pt did not want to take BP now with me by phone,. Asked her to log into BRx & record reading.

## 2019-09-09 NOTE — Progress Notes (Signed)
   TELEHEALTH VIRTUAL OBSTETRICS VISIT ENCOUNTER NOTE  I connected with Angela Branch on 09/09/19 at 10:15 AM EDT by telephone at home and verified that I am speaking with the correct person using two identifiers.   I discussed the limitations, risks, security and privacy concerns of performing an evaluation and management service by telephone and the availability of in person appointments. I also discussed with the patient that there may be a patient responsible charge related to this service. The patient expressed understanding and agreed to proceed.  Subjective:  Angela Branch is a 23 y.o. [phone] at 51w1dbeing followed for ongoing prenatal care.  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  Patient reports no complaints. Reports fetal movement. Denies any contractions, bleeding or leaking of fluid.   The following portions of the patient's history were reviewed and updated as appropriate: allergies, current medications, past family history, past medical history, past social history, past surgical history and problem list.   Objective:   General:  Alert, oriented and cooperative.   Mental Status: Normal mood and affect perceived. Normal judgment and thought content.  Rest of physical exam deferred due to type of encounter  Assessment and Plan:  Pregnancy: [id number] 237w1d. 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 Encounter for supervision of low-risk pregnancy, antepartum  Patient voices that she is upset regarding her recent [place]. She was never able to speak to the MFM MD and  was informed by the genetic counselor that the baby had  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   Discussed these findings to the patient in detail.  In the  context of low risk NIPS these findings have limited clinical signficance. She feels reassured.   She is requesting in-person visits.  Did not take her BP today, says she will check it later and report it in baby scripts.   3. Thyroid disease  TSH very low last month. Should have thyroid levels Q trimester. Has been feeling dizzy at times and thinks it may be related to her thyroid. Encouraged frequent snacks and increase oral fluid intake.     Preterm labor symptoms and general obstetric precautions including but not limited to vaginal bleeding, contractions, leaking of fluid and fetal movement were reviewed in detail with the patient.  I discussed the assessment and treatment plan with the patient. The patient was provided an opportunity to ask questions and all were answered. The patient agreed with the plan and demonstrated an understanding of the instructions. The patient was advised to call back or seek an in-person office evaluation/[facility] or concerning [facility]. Please refer to After Visit Summary for other counseling recommendations.   I provided 15 minutes of non-face-to-face time during this encounter.  No follow-ups on file.  No future appointments.  JeNoni SaupeNP [facility]

## 2019-09-20 ENCOUNTER — Ambulatory Visit (HOSPITAL_COMMUNITY): Payer: Self-pay | Admitting: Obstetrics & Gynecology

## 2019-10-01 ENCOUNTER — Other Ambulatory Visit: Payer: Self-pay | Admitting: Obstetrics & Gynecology

## 2019-10-01 DIAGNOSIS — Z148 Genetic carrier of other disease: Secondary | ICD-10-CM

## 2019-10-01 NOTE — Progress Notes (Signed)
Amb ref cards placed due to her status as a Muscular dystrophy carrier and recommendation by MFM.

## 2019-10-11 ENCOUNTER — Telehealth: Payer: Self-pay

## 2019-10-11 ENCOUNTER — Ambulatory Visit (INDEPENDENT_AMBULATORY_CARE_PROVIDER_SITE_OTHER): Payer: Medicaid Other | Admitting: Obstetrics and Gynecology

## 2019-10-11 ENCOUNTER — Other Ambulatory Visit: Payer: Self-pay

## 2019-10-11 VITALS — BP 108/62 | HR 86 | Wt 141.3 lb

## 2019-10-11 DIAGNOSIS — Z3A24 24 weeks gestation of pregnancy: Secondary | ICD-10-CM | POA: Diagnosis not present

## 2019-10-11 DIAGNOSIS — O98512 Other viral diseases complicating pregnancy, second trimester: Secondary | ICD-10-CM | POA: Diagnosis not present

## 2019-10-11 DIAGNOSIS — E079 Disorder of thyroid, unspecified: Secondary | ICD-10-CM

## 2019-10-11 DIAGNOSIS — B009 Herpesviral infection, unspecified: Secondary | ICD-10-CM

## 2019-10-11 DIAGNOSIS — Z148 Genetic carrier of other disease: Secondary | ICD-10-CM

## 2019-10-11 DIAGNOSIS — Z3492 Encounter for supervision of normal pregnancy, unspecified, second trimester: Secondary | ICD-10-CM

## 2019-10-11 NOTE — Progress Notes (Signed)
Pts Cardiology appt w/ Dr. Radford Pax is scheduled on 10/13/19 @ 2:40p.

## 2019-10-11 NOTE — Telephone Encounter (Signed)
Called pt with Cardiology appt on Angela Branch. On 10/13/19 @ 2:40p w/ Dr. Radford Pax.Pt verbalized understanding.

## 2019-10-11 NOTE — Addendum Note (Signed)
Addended by: Bethanne Ginger on: 10/11/2019 02:55 PM   Modules accepted: Orders

## 2019-10-11 NOTE — Progress Notes (Signed)
Subjective:  Angela Branch is a 23 y.o. [phone] at [redacted]w[redacted]d being seen today for ongoing prenatal care.  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  Patient reports carpal tunnel symptoms.  Contractions: Not present. Vag. Bleeding: None.  Movement: Present. Denies leaking of fluid.   The following portions of the patient's history were reviewed and updated as appropriate: allergies, current medications, past family history, past medical history, past social history, past surgical history and problem list. Problem list updated.  Objective:   Vitals:   10/11/19 1324  BP: 108/62  Pulse: 86  Weight: 141 lb 4.8 oz (64.1 kg)    Fetal Status: Fetal Heart Rate (bpm): 135   Movement: Present     General:  Alert, oriented and cooperative. Patient is in no acute distress.  Skin: Skin is warm and dry. No rash noted.   Cardiovascular: Normal heart rate noted  Respiratory: Normal respiratory effort, no problems with respiration noted  Abdomen: Soft, gravid, appropriate for gestational age. Pain/Pressure: Absent     Pelvic:  Cervical exam deferred        Extremities: Normal range of motion.  Edema: None  Mental Status: Normal mood and affect. Normal behavior. Normal judgment and thought content.   Urinalysis:      Assessment and Plan:  Pregnancy: [id number] at [redacted]w[redacted]d  1. Encounter for supervision of low-risk pregnancy in second trimester Stable Glucola next visit  2. 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 Thyroid disease  - Thyroid Panel With TSH  4. Herpes simplex  type 2 infection Suppression at 36 weeks  Preterm labor symptoms and general obstetric precautions including but not limited to vaginal bleeding, contractions, leaking of fluid and fetal movement were reviewed in detail with the patient. Please refer to After Visit Summary for other counseling recommendations.  Return in about 3 weeks (around 11/01/2019) for OB visit, face to face for glucola.   Chancy Milroy, MD

## 2019-10-11 NOTE — Patient Instructions (Signed)

## 2019-10-12 LAB — THYROID PANEL WITH TSH
Free Thyroxine Index: 1.3 (ref 1.2–4.9)
T3 Uptake Ratio: 12 % — ABNORMAL LOW (ref 24–39)
T4, Total: 10.7 ug/dL (ref 4.5–12.0)
TSH: 0.95 u[IU]/mL (ref 0.450–4.500)

## 2019-10-13 ENCOUNTER — Ambulatory Visit (INDEPENDENT_AMBULATORY_CARE_PROVIDER_SITE_OTHER): Payer: Medicaid Other | Admitting: Cardiology

## 2019-10-13 ENCOUNTER — Encounter: Payer: Self-pay | Admitting: Cardiology

## 2019-10-13 ENCOUNTER — Other Ambulatory Visit: Payer: Self-pay

## 2019-10-13 VITALS — BP 102/50 | HR 102 | Ht 62.5 in | Wt 139.6 lb

## 2019-10-13 DIAGNOSIS — Z148 Genetic carrier of other disease: Secondary | ICD-10-CM

## 2019-10-13 DIAGNOSIS — R55 Syncope and collapse: Secondary | ICD-10-CM

## 2019-10-13 NOTE — Patient Instructions (Signed)
Medication Instructions:  Your physician recommends that you continue on your current medications as directed. Please refer to the Current Medication list given to you today.  *If you need a refill on your cardiac medications before your next appointment, please call your pharmacy*  Lab Work: None Ordered  If you have labs (blood work) drawn today and your tests are completely normal, you will receive your results only by: Marland Kitchen MyChart Message (if you have MyChart) OR . A paper copy in the mail If you have any lab test that is abnormal or we need to change your treatment, we will call you to review the results.  Testing/Procedures: Your physician has requested that you have an echocardiogram. Echocardiography is a painless test that uses sound waves to create images of your heart. It provides your doctor with information about the size and shape of your heart and how well your heart's chambers and valves are working. This procedure takes approximately one hour. There are no restrictions for this procedure.  Your physician has recommended that you wear an event monitor. Event monitors are medical devices that record the heart's electrical activity. Doctors most often [name] these monitors to diagnose arrhythmias. Arrhythmias are problems with the speed or rhythm of the heartbeat. The monitor is a small, portable device. You can wear one while you do your normal daily activities. This is usually used to diagnose what is causing palpitations/syncope (passing out).    Follow-Up: At [facility], you and your health needs are our priority.  As part of our continuing mission to provide you with exceptional heart care, we have created designated Provider Care Teams.  These Care Teams include your primary Cardiologist (physician) and Advanced Practice Providers (APPs -  Physician Assistants and Nurse Practitioners) who all work together to provide you with the care you need, when you need it.  Your next  appointment:   12 months  The format for your next appointment:   In Person  Provider:   You may see Dr. Radford Pax or one of the following Advanced Practice Providers on your designated Care Team:    Melina Copa, PA-C  Ermalinda Barrios, PA-C

## 2019-10-13 NOTE — Progress Notes (Signed)
Cardiology Office Note    Date:  10/13/2019   ID:  Angela Branch, DOB Dec 13, 1996, MRN [id number]  PCP:  Patient, No Pcp Per  Cardiologist:  Fransico Him, MD   Chief Complaint  Patient presents with  . 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 female who is being seen today for the evaluation of carrier of Duchenne muscular dystrophy at the request of Chancy Milroy, MD.  This is a 23yo female with a hx of anxiety, asthma and a carrier of Duchenne muscular dystrophy.  She is [redacted] weeks pregnant and had genetic testing done with one of her pregnancies and was noted to be a carrier.  Her great uncle has fully expressed Duchenne's but no other family members. She denies any chest pain or pressure, SOB, DOE, PND, orthopnea, LE edema.  She has had syncope in the past only during pregnancy.  The last pregnancy she was told she may be dehydrated.  Recently she was out at a store and went to the bathroom to urinate and when she came out she became diaphoretic, nauseated and her heart started to race.  She got very dizzy and passed out.  She denied palpitations prior to the dizziness.  She has been trying to stay more hydrated.  She has a hx of what sounds like vasovagal syncope and passed out when he got her nipples pierced.   Past Medical History:  Diagnosis Date  . Adjustment disorder with mixed anxiety and depressed mood 04/29/2016  . Anemia   . Anxiety   . Asthma   . Asthma 06/12/2012  . 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 Eczema 10/04/2014  . Faintness 10/05/2015  . Family dynamics problem 03/25/2013  . Fetal tachycardia affecting management of mother 09/21/2018  . GBS (group B Streptococcus carrier), +RV culture, currently pregnant 11/14/2015  . GERD (gastroesophageal reflux disease) 09/21/2015  . H/O scabies 11/14/2015   Treated with permetherin   .  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 Loss of weight 03/25/2013  . Low back pain 09/20/2013  . Menorrhagia 09/28/2013  . Migraine headache 02/23/2014  . Sleep apnea 04/14/2014  . Substance abuse ([place]) 03/24/2015   + cannabis   . Supervision of low-risk pregnancy 06/28/2019    Nursing Staff Provider Office Location  [facility] Dating   early u/s Language  English Anatomy US   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  Flu Vaccine   Genetic Screen  NIPS: low risk female      TDaP vaccine    Hgb A1C or  GTT  Third trimester  Rhogam  n/a   LAB RESULTS  Feeding Plan Breast/Bottle Blood Type A/Positive/-- (07/16 1206)  Contraception undecided Antibody Negative (07/16  . Thyroid disease     Past Surgical History:  Procedure Laterality Date  . TOOTH EXTRACTION      Current Medications: Current Meds  Medication Sig  . AMBULATORY NON FORMULARY MEDICATION 1 Device by Other route as needed. BP cuff- Regular size Monitored regularly at home ICD 10: Z34.90    Allergies:   Azithromycin   Social History   Socioeconomic History  . Marital status: Single    Spouse name: Not on file  . Number of children: Not on file  . Years of education: 9th  . Highest education level: Not on  file  Occupational History    Employer: UNEMPLOYED  Social Needs  . Financial resource strain: Not on file  . Food insecurity    Worry: Never true    Inability: Never true  . Transportation needs    Medical: No    Non-medical: No  Tobacco Use  . Smoking status: Never Smoker  . Smokeless tobacco: Never Used  Substance and Sexual Activity  . Alcohol use: No    Alcohol/week: 0.0 standard drinks  . Drug use: No  . Sexual activity: Yes    Birth control/protection: None    Comment: last sex Sep 06 2018  Lifestyle  . Physical activity    Days per week: Not on file    Minutes per session: Not on file  . Stress: Not on file  Relationships  . Social Herbalist on phone: Not on file    Gets  together: Not on file    Attends religious service: Not on file    Active member of club or organization: Not on file    Attends meetings of clubs or organizations: Not on file    Relationship status: Not on file  Other Topics Concern  . Not on file  Social History Narrative  . Not on file     Family History:  The patient's family history includes Asthma in her father; Cancer - Colon in her maternal grandfather; Diabetes in her maternal grandmother; Heart disease in her paternal grandfather; Hyperlipidemia in her paternal grandfather.   ROS:   Please see the history of present illness.    ROS All other systems reviewed and are negative.  No flowsheet data found.     PHYSICAL EXAM:   VS:  BP (!) 102/50   Pulse (!) 102   Ht 5' 2.5" (1.588 m)   Wt 139 lb 9.6 oz (63.3 kg)   LMP 04/12/2019   SpO2 97%   BMI 25.13 kg/m    GEN: Well nourished, well developed, in no acute distress  HEENT: normal  Neck: no JVD, carotid bruits, or masses Cardiac: RRR; no murmurs, rubs, or gallops,no edema.  Intact distal pulses bilaterally.  Respiratory:  clear to auscultation bilaterally, normal work of breathing GI: soft, nontender, nondistended, + BS MS: no deformity or atrophy  Skin: warm and dry, no rash Neuro:  Alert and Oriented x 3, Strength and sensation are intact Psych: euthymic mood, full affect  Wt Readings from Last 3 Encounters:  10/13/19 139 lb 9.6 oz (63.3 kg)  10/11/19 141 lb 4.8 oz (64.1 kg)  07/08/19 125 lb (56.7 kg)      Studies/Labs Reviewed:   EKG:  EKG is notordered today.   Recent Labs: 05/15/2019: ALT 15; BUN 11; Creatinine, Ser 0.88; Potassium 3.8; Sodium 134 07/08/2019: Hemoglobin 13.5; Platelets 212 10/11/2019: TSH 0.950   Lipid Panel No results found for: CHOL, TRIG, HDL, CHOLHDL, VLDL, LDLCALC, LDLDIRECT  Additional studies/ records that were reviewed today include:  Office notes from OB    ASSESSMENT:    1. 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 Syncope, unspecified syncope type      PLAN:  In order of problems listed above:  1. Carrier of Duchenne's muscular dystrophy -I will get a 2D echo to assess LVF -recommend follow up yearly with Cardiology with occasional echo to follow LVF  2.  Syncope -sx sound vasovagal in origin and both times occurred while pregnant and after going to the bathroom -she gives a hx  of syncope while getting nipples pierced and said she passed out because of the pain -encouraged her to stay hydrated -check 2D echo -event monitor to assess for arrhythmias    Medication Adjustments/Labs and Tests Ordered: Current medicines are reviewed at length with the patient today.  Concerns regarding medicines are outlined above.  Medication changes, Labs and Tests ordered today are listed in the Patient Instructions below.  Patient Instructions  Medication Instructions:  Your physician recommends that you continue on your current medications as directed. Please refer to the Current Medication list given to you today.  *If you need a refill on your cardiac medications before your next appointment, please call your pharmacy*  Lab Work: None Ordered  If you have labs (blood work) drawn today and your tests are completely normal, you will receive your results only by: Marland Kitchen MyChart Message (if you have MyChart) OR . A paper copy in the mail If you have any lab test that is abnormal or we need to change your treatment, we will call you to review the results.  Testing/Procedures: Your physician has requested that you have an echocardiogram. Echocardiography is a painless test that uses sound waves to create images of your heart. It provides your doctor with information about the size and shape of your heart and how well your heart's chambers and valves are working. This procedure takes approximately one hour. There are no restrictions for this procedure.  Your physician has recommended that you wear an event  monitor. Event monitors are medical devices that record the heart's electrical activity. Doctors most often [name] these monitors to diagnose arrhythmias. Arrhythmias are problems with the speed or rhythm of the heartbeat. The monitor is a small, portable device. You can wear one while you do your normal daily activities. This is usually used to diagnose what is causing palpitations/syncope (passing out).    Follow-Up: At [facility], you and your health needs are our priority.  As part of our continuing mission to provide you with exceptional heart care, we have created designated Provider Care Teams.  These Care Teams include your primary Cardiologist (physician) and Advanced Practice Providers (APPs -  Physician Assistants and Nurse Practitioners) who all work together to provide you with the care you need, when you need it.  Your next appointment:   12 months  The format for your next appointment:   In Person  Provider:   You may see Dr. Radford Pax or one of the following Advanced Practice Providers on your designated Care Team:    Melina Copa, PA-C  Ermalinda Barrios, PA-C      Signed, Fransico Him, MD  10/13/2019 3:16 PM    [address], [place], [place]  [address] Phone: [phone]; Fax: [phone]

## 2019-10-14 ENCOUNTER — Emergency Department (HOSPITAL_COMMUNITY)
Admission: EM | Admit: 2019-10-14 | Discharge: 2019-10-14 | Disposition: A | Discharge status: Home / Self Care | Payer: Medicaid Other | Attending: Emergency Medicine | Admitting: Emergency Medicine

## 2019-10-14 ENCOUNTER — Encounter (HOSPITAL_COMMUNITY): Payer: Self-pay

## 2019-10-14 ENCOUNTER — Ambulatory Visit (INDEPENDENT_AMBULATORY_CARE_PROVIDER_SITE_OTHER): Payer: Medicaid Other

## 2019-10-14 ENCOUNTER — Ambulatory Visit (HOSPITAL_COMMUNITY)
Admission: EM | Admit: 2019-10-14 | Discharge: 2019-10-14 | Disposition: A | Discharge status: Home / Self Care | Payer: Medicaid Other | Attending: Family Medicine | Admitting: Family Medicine

## 2019-10-14 ENCOUNTER — Other Ambulatory Visit: Payer: Self-pay

## 2019-10-14 DIAGNOSIS — S93602A Unspecified sprain of left foot, initial encounter: Secondary | ICD-10-CM

## 2019-10-14 DIAGNOSIS — M79672 Pain in left foot: Secondary | ICD-10-CM | POA: Diagnosis not present

## 2019-10-14 DIAGNOSIS — S93402A Sprain of unspecified ligament of left ankle, initial encounter: Secondary | ICD-10-CM

## 2019-10-14 DIAGNOSIS — Z5321 Procedure and treatment not carried out due to patient leaving prior to being seen by health care provider: Secondary | ICD-10-CM | POA: Diagnosis not present

## 2019-10-14 DIAGNOSIS — M25572 Pain in left ankle and joints of left foot: Secondary | ICD-10-CM | POA: Insufficient documentation

## 2019-10-14 NOTE — ED Triage Notes (Signed)
Patient presents to Urgent Care with complaints of left ankle pain since 3 bricks fell on it a few hours ago. Patient reports she is pregnant, has not taken meds pta, is able to walk independently.

## 2019-10-14 NOTE — ED Triage Notes (Signed)
Pt sts 3 bricks fell on her L ankle just before coming to the ER and sts there is a large knot. Pt ambulatory.

## 2019-10-14 NOTE — ED Provider Notes (Signed)
[place]    CSN: [id number] Arrival date & time: 10/14/19  1557      History   Chief Complaint Chief Complaint  Patient presents with  . Ankle Pain    HPI Angela Branch is a 23 y.o. female approximately [redacted] weeks pregnant, asthma, presenting today for evaluation of left ankle/foot injury.  Patient states that earlier today she dropped a couple bricks on her left foot/ankle and this caused her to twist and roll her ankle.  Since she has had pain in her ankle and foot as well as pain with weightbearing.  Patient does believe she has a remote history of previous fracture in this foot.  HPI  Past Medical History:  Diagnosis Date  . Adjustment disorder with mixed anxiety and depressed mood 04/29/2016  . Anemia   . Anxiety   . Asthma   . Asthma 06/12/2012  . 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 Eczema 10/04/2014  . Faintness 10/05/2015  . Family dynamics problem 03/25/2013  . Fetal tachycardia affecting management of mother 09/21/2018  . GBS (group B Streptococcus carrier), +RV culture, currently pregnant 11/14/2015  . GERD (gastroesophageal reflux disease) 09/21/2015  . H/O scabies 11/14/2015   Treated with permetherin   . 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 Loss of weight 03/25/2013  . Low back pain 09/20/2013  . Menorrhagia 09/28/2013  . Migraine headache 02/23/2014  . Sleep apnea 04/14/2014  . Substance abuse ([place]) 03/24/2015   + cannabis   . Supervision of low-risk pregnancy 06/28/2019    Nursing Staff Provider Office Location  [facility] Dating   early u/s Language  English Anatomy US   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  Flu Vaccine   Genetic Screen  NIPS: low risk female      TDaP vaccine    Hgb A1C or  GTT  Third trimester  Rhogam  n/a   LAB RESULTS  Feeding Plan Breast/Bottle Blood Type A/Positive/-- (07/16 1206)  Contraception undecided Antibody Negative (07/16  . Thyroid disease     Patient Active  Problem List   Diagnosis Date Noted  . 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 Supervision of low-risk pregnancy 06/28/2019  . Fetal tachycardia affecting management of mother 09/21/2018  . 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 Thyroid disease 03/12/2018  . Adjustment disorder with mixed anxiety and depressed mood 04/29/2016  . GERD (gastroesophageal reflux disease) 09/21/2015  . Eczema 10/04/2014  . Asthma 06/12/2012    Past Surgical History:  Procedure Laterality Date  . TOOTH EXTRACTION      OB History    Gravida  4   Para  2   Term  2   Preterm  0   AB  1   Living  2     SAB  1   TAB  0   Ectopic  0   Multiple  0   Live Births  2            Home Medications    Prior to Admission medications   Medication Sig Start Date End Date Taking? Authorizing Provider  AMBULATORY NON FORMULARY MEDICATION 1 Device by Other route as needed. BP cuff- Regular size Monitored regularly at home ICD 10: Z34.90 06/28/19   Emily Filbert, MD    Family History Family History  Problem Relation Age of Onset  . Asthma Father   . Diabetes Maternal Grandmother   .  Hyperlipidemia Paternal Grandfather   . Heart disease Paternal Grandfather   . Cancer - Colon Maternal Grandfather   . Mental illness Neg Hx   . Birth defects Neg Hx   . Kidney disease Neg Hx   . Hypertension Neg Hx     Social History Social History   Tobacco Use  . Smoking status: Never Smoker  . Smokeless tobacco: Never Used  Substance Use Topics  . Alcohol use: No    Alcohol/week: 0.0 standard drinks  . Drug use: No     Allergies   Azithromycin   Review of Systems Review of Systems  Constitutional: Negative for fatigue and fever.  Eyes: Negative for visual disturbance.  Respiratory: Negative for shortness of breath.   Cardiovascular: Negative for chest pain.  Gastrointestinal: Negative for abdominal pain, nausea and vomiting.  Musculoskeletal: Positive for  arthralgias, gait problem and joint swelling.  Skin: Negative for color change, rash and wound.  Neurological: Negative for dizziness, weakness, light-headedness and headaches.     Physical Exam Triage Vital Signs ED Triage Vitals  Enc Vitals Group     BP 10/14/19 1628 106/63     Pulse Rate 10/14/19 1628 (!) 103     Resp 10/14/19 1628 17     Temp 10/14/19 1628 98.3 F (36.8 C)     Temp Source 10/14/19 1628 Tympanic     SpO2 10/14/19 1628 100 %     Weight --      Height --      Head Circumference --      Peak Flow --      Pain Score 10/14/19 1643 4     Pain Loc --      Pain Edu? --      Excl. in [place]? --    No data found.  Updated Vital Signs BP 106/63 (BP Location: Left Arm)   Pulse (!) 103   Temp 98.3 F (36.8 C) (Tympanic)   Resp 17   LMP 04/12/2019   SpO2 100%   Visual Acuity Right Eye Distance:   Left Eye Distance:   Bilateral Distance:    Right Eye Near:   Left Eye Near:    Bilateral Near:     Physical Exam Vitals signs and nursing note reviewed.  Constitutional:      Appearance: She is well-developed.     Comments: No acute distress  HENT:     Head: Normocephalic and atraumatic.     Nose: Nose normal.  Eyes:     Conjunctiva/sclera: Conjunctivae normal.  Neck:     Musculoskeletal: Neck supple.  Cardiovascular:     Rate and Rhythm: Normal rate.  Pulmonary:     Effort: Pulmonary effort is normal. No respiratory distress.  Abdominal:     General: There is no distension.  Musculoskeletal: Normal range of motion.     Comments: No obvious deformity to left foot/ankle, tenderness to palpation over anterior ankle and medial malleolus, tenderness does extend into distal metatarsals of foot, no overlying discoloration or bruising noted, patient is able to fully dorsiflex and plantar flex, negative Thompson's Dorsalis pedis 2+  Skin:    General: Skin is warm and dry.  Neurological:     Mental Status: She is alert and oriented to person, place, and time.       UC Treatments / Results  Labs (all labs ordered are listed, but only abnormal results are displayed) Labs Reviewed - No data to display  EKG   Radiology Dg Foot  Complete Left  Result Date: 10/14/2019 CLINICAL DATA:  Left foot pain, most pronounced medially. EXAM: LEFT FOOT - COMPLETE 3+ VIEW COMPARISON:  None. FINDINGS: There is no evidence of fracture or dislocation. There is no evidence of arthropathy or other focal bone abnormality. Soft tissues are unremarkable. IMPRESSION: Negative. Electronically Signed   By: Davina Poke M.D.   On: 10/14/2019 17:15    Procedures Procedures (including critical care time)  Medications Ordered in UC Medications - No data to display  Initial Impression / Assessment and Plan / UC Course  I have reviewed the triage vital signs and the nursing notes.  Pertinent labs & imaging results that were available during my care of the patient were reviewed by me and considered in my medical decision making (see chart for details).     X-ray negative for acute bony abnormality.  X-ray of foot obtained in order to visualize distal metacarpals as well as ankle, opted for 1 imaging giving patient's pregnancy status.  No abnormalities noted within distal tibia/fibula.  Will treat as sprain, provided Ace wrap, crutches for comfort.  Consent obtained prior to x-ray and discussed risks.  Monitor for gradual resolution over the next 1 to 2 weeks.  Weight-bear as tolerated.  Ice and elevate.  Tylenol for pain and swelling.  Avoid NSAIDs as patient is pregnant.  Discussed strict return precautions. Patient verbalized understanding and is agreeable with plan.  Final Clinical Impressions(s) / UC Diagnoses   Final diagnoses:  Foot sprain, left, initial encounter  Sprain of left ankle, unspecified ligament, initial encounter     Discharge Instructions     Xray normal Tylenol for pain/swelling Ace wrap Weight bear as tolerated Ice and elevate  Follow up if not resolving    ED Prescriptions    None     PDMP not reviewed this encounter.   Janith Lima, [place] 10/14/19 2016

## 2019-10-14 NOTE — Discharge Instructions (Signed)
Xray normal Tylenol for pain/swelling Ace wrap Weight bear as tolerated Ice and elevate Follow up if not resolving

## 2019-10-20 ENCOUNTER — Other Ambulatory Visit: Payer: Self-pay

## 2019-10-20 ENCOUNTER — Ambulatory Visit (HOSPITAL_COMMUNITY): Payer: Medicaid Other | Attending: Cardiovascular Disease

## 2019-10-20 ENCOUNTER — Ambulatory Visit (INDEPENDENT_AMBULATORY_CARE_PROVIDER_SITE_OTHER): Payer: Medicaid Other

## 2019-10-20 VITALS — BP 107/69 | HR 84 | Ht 62.5 in

## 2019-10-20 DIAGNOSIS — Z148 Genetic carrier of other disease: Secondary | ICD-10-CM | POA: Insufficient documentation

## 2019-10-20 DIAGNOSIS — R55 Syncope and collapse: Secondary | ICD-10-CM | POA: Diagnosis not present

## 2019-10-20 LAB — ECHOCARDIOGRAM COMPLETE: Height: 62.5 in

## 2019-10-20 NOTE — Progress Notes (Signed)
1.) Reason for visit: EKG   2.) Name of MD requesting visit: Dr. Radford Pax  3.) H&P: See Epic  4.) ROS related to problem: See Epic  5.) Assessment and plan per MD:   EKG was reviewed by DOD, Dr. Radford Pax. EKG was normal and no changes were made. Patient has no complaints or concerns. Patient will follow-up with Dr. Radford Pax in 1 year.

## 2019-10-21 ENCOUNTER — Telehealth: Payer: Self-pay

## 2019-10-21 NOTE — Telephone Encounter (Signed)
The patient has been notified of the Echo result and verbalized understanding.  All questions (if any) were answered. Frederik Schmidt, RN 10/21/2019 12:12 PM

## 2019-10-21 NOTE — Telephone Encounter (Signed)
-----   Message from Sueanne Margarita, MD sent at 10/21/2019 11:07 AM EDT ----- Normal Echo

## 2019-10-21 NOTE — Telephone Encounter (Signed)
Notes recorded by Frederik Schmidt, RN on 10/21/2019 at 12:11 PM EDT  The patient has been notified of the Echo result and verbalized understanding. All questions (if any) were answered.  Frederik Schmidt, RN 10/21/2019 12:11 PM

## 2019-10-21 NOTE — Telephone Encounter (Signed)
Follow Up  Patient is returning call. States that if she doesn't answer, leave results on vm.

## 2019-10-21 NOTE — Telephone Encounter (Signed)
Notes recorded by Frederik Schmidt, RN on 10/21/2019 at 11:44 AM EDT  [email or web address] 10/29  ------

## 2019-10-28 ENCOUNTER — Other Ambulatory Visit: Payer: Self-pay | Admitting: Lactation Services

## 2019-10-28 ENCOUNTER — Other Ambulatory Visit: Payer: Self-pay

## 2019-10-28 ENCOUNTER — Encounter (HOSPITAL_COMMUNITY): Payer: Self-pay | Admitting: *Deleted

## 2019-10-28 ENCOUNTER — Inpatient Hospital Stay (HOSPITAL_COMMUNITY)
Admission: AD | Admit: 2019-10-28 | Discharge: 2019-10-28 | Disposition: A | Discharge status: Home / Self Care | Payer: Medicaid Other | Attending: Obstetrics & Gynecology | Admitting: Obstetrics & Gynecology

## 2019-10-28 ENCOUNTER — Telehealth: Payer: Self-pay

## 2019-10-28 DIAGNOSIS — Z881 Allergy status to other antibiotic agents status: Secondary | ICD-10-CM | POA: Insufficient documentation

## 2019-10-28 DIAGNOSIS — Z825 Family history of asthma and other chronic lower respiratory diseases: Secondary | ICD-10-CM | POA: Diagnosis not present

## 2019-10-28 DIAGNOSIS — Z3A27 27 weeks gestation of pregnancy: Secondary | ICD-10-CM | POA: Diagnosis not present

## 2019-10-28 DIAGNOSIS — R339 Retention of urine, unspecified: Secondary | ICD-10-CM

## 2019-10-28 DIAGNOSIS — R109 Unspecified abdominal pain: Secondary | ICD-10-CM | POA: Diagnosis present

## 2019-10-28 DIAGNOSIS — O26892 Other specified pregnancy related conditions, second trimester: Secondary | ICD-10-CM | POA: Diagnosis not present

## 2019-10-28 DIAGNOSIS — Z3492 Encounter for supervision of normal pregnancy, unspecified, second trimester: Secondary | ICD-10-CM

## 2019-10-28 DIAGNOSIS — N9089 Other specified noninflammatory disorders of vulva and perineum: Secondary | ICD-10-CM

## 2019-10-28 DIAGNOSIS — B009 Herpesviral infection, unspecified: Secondary | ICD-10-CM

## 2019-10-28 LAB — URINALYSIS, ROUTINE W REFLEX MICROSCOPIC
Bilirubin Urine: NEGATIVE
Glucose, UA: NEGATIVE mg/dL
Hgb urine dipstick: NEGATIVE
Ketones, ur: NEGATIVE mg/dL
Leukocytes,Ua: NEGATIVE
Nitrite: NEGATIVE
Protein, ur: NEGATIVE mg/dL
Specific Gravity, Urine: 1.025 (ref 1.005–1.030)
pH: 6 (ref 5.0–8.0)

## 2019-10-28 LAB — WET PREP, GENITAL
Clue Cells Wet Prep HPF POC: NONE SEEN
Sperm: NONE SEEN
Trich, Wet Prep: NONE SEEN
Yeast Wet Prep HPF POC: NONE SEEN

## 2019-10-28 MED ORDER — VALACYCLOVIR HCL 500 MG PO TABS: ORAL_TABLET | ORAL | 2 refills | Status: DC

## 2019-10-28 MED ORDER — LIDOCAINE HCL 2 % EX GEL: 1.0000 "application " | CUTANEOUS | 0 refills | Status: DC | PRN

## 2019-10-28 MED ORDER — LIDOCAINE HCL URETHRAL/MUCOSAL 2 % EX GEL
1.0000 "application " | Freq: Once | CUTANEOUS | Status: AC
  Administered 2019-10-28: 1 via TOPICAL
  Filled 2019-10-28: qty 5

## 2019-10-28 NOTE — MAU Provider Note (Signed)
Chief Complaint:  Abdominal Pain and Dysuria   First Provider Initiated Contact with Patient 10/28/19 0323     HPI: Angela Branch is a 23 y.o. [id number] at 10w1dwho presents to maternity admissions reporting inability to urinate.  States has vulvar irritation and burning which make her hesitant and unable to urinate. Has not urinated all day.  States intercourse caused her to feel "rubbed raw" afterward and has had burning there ever since. . She reports good fetal movement, denies LOF, vaginal bleeding, vaginal itching/burning, urinary symptoms, h/a, dizziness, n/v, diarrhea, constipation or fever/chills.    Dysuria  This is a new problem. The current episode started in the past 7 days. The problem occurs every urination. The problem has been unchanged. There has been no fever. She is sexually active. There is no history of pyelonephritis. Associated symptoms include hesitancy. Pertinent negatives include no chills, discharge, flank pain, frequency, hematuria, nausea, urgency or vomiting. She has tried nothing for the symptoms.   Had intercourse on Sunday and since then it really hurts to urinate. That has gotten worse each day. I have some yellow d/c. I don't know if I am really irritated or have uti. Denies fever/chills.Some lower abd cramping  Past Medical History: Past Medical History:  Diagnosis Date  . Adjustment disorder with mixed anxiety and depressed mood 04/29/2016  . Anemia   . Anxiety   . Asthma   . Asthma 06/12/2012  . 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 Eczema 10/04/2014  . Faintness 10/05/2015  . Family dynamics problem 03/25/2013  . Fetal tachycardia affecting management of mother 09/21/2018  . GBS (group B Streptococcus carrier), +RV culture, currently pregnant 11/14/2015  . GERD (gastroesophageal reflux disease) 09/21/2015  . H/O scabies 11/14/2015   Treated with permetherin   . 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 Loss  of weight 03/25/2013  . Low back pain 09/20/2013  . Menorrhagia 09/28/2013  . Migraine headache 02/23/2014  . Sleep apnea 04/14/2014  . Substance abuse ([facility]) 03/24/2015   + cannabis   . Supervision of low-risk pregnancy 06/28/2019    Nursing Staff Provider Office Location  [facility] Dating   early u/s Language  English Anatomy US   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  Flu Vaccine   Genetic Screen  NIPS: low risk female      TDaP vaccine    Hgb A1C or  GTT  Third trimester  Rhogam  n/a   LAB RESULTS  Feeding Plan Breast/Bottle Blood Type A/Positive/-- (07/16 1206)  Contraception undecided Antibody Negative (07/16  . Thyroid disease     Past obstetric history: OB History  Gravida Para Term Preterm AB Living  4 2 2  0 1 2  SAB TAB Ectopic Multiple Live Births  1 0 0 0 2    # Outcome Date GA Lbr Len/2nd Weight Sex Delivery Anes PTL Lv  4 Current           3 Term 09/22/18 [redacted]w[redacted]d 558:17 / 00:23 2900 g F Vag-Spont EPI  LIV  2 Term 12/01/15 [redacted]w[redacted]d 16:59 / 03:13 3245 g M Vag-Spont EPI  LIV     Birth Comments: Hgb, Normal, Fa Newborn Screen Barcode: [redacted]w[redacted]d Date collected: 12/02/2015  1 SAB 2015             Birth Comments: not seen in hospital, not sure how far she was    Past Surgical History: Past Surgical History:  Procedure Laterality  Date  . TOOTH EXTRACTION      Family History: Family History  Problem Relation Age of Onset  . Asthma Father   . Diabetes Maternal Grandmother   . Hyperlipidemia Paternal Grandfather   . Heart disease Paternal Grandfather   . Cancer - Colon Maternal Grandfather   . Mental illness Neg Hx   . Birth defects Neg Hx   . Kidney disease Neg Hx   . Hypertension Neg Hx     Social History: Social History   Tobacco Use  . Smoking status: Never Smoker  . Smokeless tobacco: Never Used  Substance Use Topics  . Alcohol use: No    Alcohol/week: 0.0 standard drinks  . Drug use: No    Allergies:  Allergies  Allergen Reactions  . Azithromycin  Diarrhea    Diarrhea, nausea/vomiting, IV burns vein Diarrhea, nausea/vomiting, IV burns vein    Meds:  Medications Prior to Admission  Medication Sig Dispense Refill Last Dose  . AMBULATORY NON FORMULARY MEDICATION 1 Device by Other route as needed. BP cuff- Regular size Monitored regularly at home ICD 10: Z34.90 1 Device 0     I have reviewed patient's Past Medical Hx, Surgical Hx, Family Hx, Social Hx, medications and allergies.   ROS:  Review of Systems  Constitutional: Negative for chills and fever.  Respiratory: Negative for shortness of breath.   Gastrointestinal: Negative for constipation, diarrhea, nausea and vomiting.  Genitourinary: Positive for difficulty urinating, dyspareunia, dysuria, genital sores, hesitancy and vaginal pain. Negative for flank pain, frequency, hematuria and urgency.   Other systems negative  Physical Exam   Patient Vitals for the past 24 hrs:  BP Temp Pulse Resp Height Weight  10/28/19 0250 (!) 104/57 98 F (36.7 C) 84 17 5' 2.5" (1.588 m) 65.3 kg   Constitutional: Well-developed, well-nourished female in no acute distress.  Cardiovascular: normal rate and rhythm Respiratory: normal effort, clear to auscultation bilaterally GI: Abd soft, non-tender, gravid appropriate for gestational age.   No rebound or guarding. MS: Extremities nontender, no edema, normal ROM Neurologic: Alert and oriented x 4.  GU: Neg CVAT.  PELVIC EXAM:   There are lesions on left labia minora and near urethra.  Striated pattern consistent with vulvar tearing, but could represent Herpetic lesions as well.    FHT:  Baseline 140 , moderate variability, small accelerations present (consistent with gestational age), no decelerations Contractions:  Irregular  Rare   Labs: Results for orders placed or performed during the hospital encounter of 10/28/19 (from the past 24 hour(s))  Urinalysis, Routine w reflex microscopic     Status: Abnormal   Collection Time: 10/28/19   2:58 AM  Result Value Ref Range   Color, Urine YELLOW YELLOW   APPearance CLOUDY (A) CLEAR   Specific Gravity, Urine 1.025 1.005 - 1.030   pH 6.0 5.0 - 8.0   Glucose, UA NEGATIVE NEGATIVE mg/dL   Hgb urine dipstick NEGATIVE NEGATIVE   Bilirubin Urine NEGATIVE NEGATIVE   Ketones, ur NEGATIVE NEGATIVE mg/dL   Protein, ur NEGATIVE NEGATIVE mg/dL   Nitrite NEGATIVE NEGATIVE   Leukocytes,Ua NEGATIVE NEGATIVE  Wet prep, genital     Status: Abnormal   Collection Time: 10/28/19  3:39 AM  Result Value Ref Range   Yeast Wet Prep HPF POC NONE SEEN NONE SEEN   Trich, Wet Prep NONE SEEN NONE SEEN   Clue Cells Wet Prep HPF POC NONE SEEN NONE SEEN   WBC, Wet Prep HPF POC FEW (A) NONE SEEN  Sperm NONE SEEN     A/Positive/-- (07/16 1206)  Imaging:  Bladder scan after catheterization showed 81ml Patient was concerned her bladder had not been totally emptied with cath.  MAU Course/MDM: I have ordered labs and reviewed results.  These are normal  NST reviewed, reassuring Topical Lidocaine gel 2%  Applied  Catheterization performed to empty bladder. Dark urine   Discussed lesions look like excoriation but could represent herpetic lesions Will Rx Valtrex for use to shorten outbreak Rx sent for Lidocaine gel  Assessment: [facility] at [redacted]w[redacted]d Vulvar lesions/excoriation Urinary retention  Plan: Discharge home Valtrex rx sent for possible hsv outbreak Rx Lidocaine for PRN topical use to help with voiding Follow up in Office for prenatal visits and recheck of status  Encouraged to return here or to other Urgent Care/ED if she develops worsening of symptoms, increase in pain, fever, or other concerning symptoms.   Pt stable at time of discharge.  Hansel Feinstein CNM, MSN Certified Nurse-Midwife 10/28/2019 3:23 AM

## 2019-10-28 NOTE — Progress Notes (Signed)
EFM d/ced. I&O cath with 300cc dark amber urine. U/A sent. Pt still felt like needed to void afterward and up to BR. Teary eyed. Thought catheter would be left in but explained that increases risk of infection

## 2019-10-28 NOTE — MAU Note (Addendum)
Had intercourse on Sunday and since then it really hurts to urinate. That has gotten worse each day. I have some yellow d/c. I don't know if I am really irritated or have uti. Denies fever/chills.Some lower abd cramping

## 2019-10-28 NOTE — MAU Note (Signed)
Pt does not feel that bladder was emptied by I&O cath. Bladder scan done and 90cc noted. Hansel Feinstein CNM aware.

## 2019-10-28 NOTE — Telephone Encounter (Signed)
Pt called and stated that she went to MAU and they gave her lidocaine jell that is not working.  Pt heard on the phone crying that she is having some severe pain while urination.   I verified with pt if she was having an outbreak she stated "no, I don't have an outbreak but it came from having sex."  I advised pt that when she goes to the bathroom to use a luke warm squirt bottle to keep the urine off the perineum and hope that it helps with burning sensation.  I also advised pt to continue to take the Valtrex as prescribed and the Lidocaine jell when she is not urinating and if she does not feel better on Monday to call the office.  Pt verbalized understanding.

## 2019-10-28 NOTE — Telephone Encounter (Signed)
LM for pt to call so we can get her Event Monitor ordered.

## 2019-10-28 NOTE — Progress Notes (Signed)
Hansel Feinstein CNM in to discuss test results and d/c plan. Written and verbal d/c instructions given and understanding voiced.

## 2019-10-28 NOTE — Telephone Encounter (Signed)
Pt called back. I went over monitor instructions. Verified address. 30 day [employer] Event monitor ordered.

## 2019-10-28 NOTE — Discharge Instructions (Signed)
Acute Urinary Retention, Female  Acute urinary retention is a condition in which a person is unable to pass urine. This can last for a short time or for a long time. If left untreated, it can result in kidney damage or other serious complications. What are the causes? This condition may be caused by:  Obstruction or narrowing of the tube that drains the bladder (urethra). This may be caused by surgery or problems with nearby organs, which can press or squeeze the urethra.  Problems with the nerves in the bladder. These can be caused by diseases, such as multiple sclerosis, or by spinal cord injuries.  Certain medicines.  Tumors in the area of the pelvis, bladder, or urethra.  Degenerative cognitive conditions such as delirium or dementia.  Diabetes.  Vaginal childbirth.  Bladder or urinary tract infection.  Constipation.  Blood in the urine (hematuria).  Injury to the bladder or urethra.  Psychological (psychogenic) conditions. Someone may hold her urine due to trauma or because she does not want to use the bathroom. What are the signs or symptoms? Symptoms of this condition include:  Trouble urinating.  Pain in the lower abdomen. How is this diagnosed? This condition is diagnosed based on a physical exam and a medical history. You may also have other tests, including:  An ultrasound of the bladder or kidneys or both.  Blood tests.  A urine analysis.  Additional tests may be needed such as an MRI, kidney, or bladder function tests. How is this treated? Treatment for this condition may include:  Medicines.  Placing a thin, sterile tube (catheter) into the bladder to drain urine out of the body. This is called an indwelling urinary catheter. After being inserted, the catheter is held in place with a small balloon that is filled with sterile water. Urine drains from the catheter into a collection bag outside of the body.  Behavioral therapy.  Treatment for any  underlying conditions.  If needed, you may be treated in the hospital for kidney function problems or to manage other complications. Follow these instructions at home:  Take over-the-counter and prescription medicines only as told by your health care provider. Avoid certain medicines, such as decongestants, antihistamines, and some prescription medicines. Do not take any medicine unless your health care provider has approved.  If you were given an indwelling urinary catheter, take care of it as told by your health care provider.  Drink enough fluid to keep your urine clear or pale yellow.  If you were prescribed an antibiotic, take it as told by your health care provider. Do not stop taking the antibiotic even if you start to feel better.  Do not use any products that contain nicotine or tobacco, such as cigarettes and e-cigarettes. If you need help quitting, ask your health care provider.  Monitor any changes in your symptoms. Tell your health care provider about any changes.  If instructed, monitor your blood pressure at home. Report changes as told by your health care provider.  Keep all follow-up visits as told by your health care provider. This is important. Contact a health care provider if:  You have uncomfortable bladder contractions that you cannot control (spasms) or you leak urine with the spasms. Get help right away if:  You have chills or fever.  You have blood in your urine.  You have a catheter and: ? Your catheter stops draining urine. ? Your catheter falls out. Summary  Acute urinary retention is a condition in which a person  is unable to pass urine. If left untreated, it can result in kidney damage or other serious complications.  This condition may be caused by surgery or problems with nearby organs, which can press or squeeze the urethra.  Treatment may include medicines and placement of an indwelling urinary catheter.  Monitor any changes in your symptoms.  Tell your health care provider about any changes. This information is not intended to replace advice given to you by your health care provider. Make sure you discuss any questions you have with your health care provider. Document Released: 12/08/2006 Document Revised: 11/21/2017 Document Reviewed: 01/10/2017 Elsevier Patient Education  2020 [place].  Genital Herpes Genital herpes is a common sexually transmitted infection (STI) that is caused by a virus. The virus spreads from person to person through sexual contact. Infection can cause itching, blisters, and sores around the genitals or rectum. Symptoms may last several days and then go away This is called an outbreak. However, the virus remains in your body, so you may have more outbreaks in the future. The time between outbreaks varies and can be months or years. Genital herpes affects men and women. It is particularly concerning for pregnant women because the virus can be passed to the baby during delivery and can cause serious problems. Genital herpes is also a concern for people who have a weak disease-fighting (immune) system. What are the causes? This condition is caused by the herpes simplex virus (HSV) type 1 or type 2. The virus may spread through:  Sexual contact with an infected person, including vaginal, anal, and oral sex.  Contact with fluid from a herpes sore.  The skin. This means that you can get herpes from an infected partner even if he or she does not have a visible sore or does not know that he or she is infected. What increases the risk? You are more likely to develop this condition if:  You have sex with many partners.  You do not use latex condoms during sex. What are the signs or symptoms? Most people do not have symptoms (asymptomatic) or have mild symptoms that may be mistaken for other skin problems. Symptoms may include:  Small red bumps near the genitals, rectum, or mouth. These bumps turn into blisters  and then turn into sores.  Flu-like symptoms, including: ? Fever. ? Body aches. ? Swollen lymph nodes. ? Headache.  Painful urination.  Pain and itching in the genital area or rectal area.  Vaginal discharge.  Tingling or shooting pain in the legs and buttocks. Generally, symptoms are more severe and last longer during the first (primary) outbreak. Flu-like symptoms are also more common during the primary outbreak. How is this diagnosed? Genital herpes may be diagnosed based on:  A physical exam.  Your medical history.  Blood tests.  A test of a fluid sample (culture) from an open sore. How is this treated? There is no cure for this condition, but treatment with antiviral medicines that are taken by mouth (orally) can do the following:  Speed up healing and relieve symptoms.  Help to reduce the spread of the virus to sexual partners.  Limit the chance of future outbreaks, or make future outbreaks shorter.  Lessen symptoms of future outbreaks. Your health care provider may also recommend pain relief medicines, such as aspirin or ibuprofen. Follow these instructions at home: Sexual activity  Do not have sexual contact during active outbreaks.  Practice safe sex. Latex condoms and female condoms may help prevent the  spread of the herpes virus. General instructions  Keep the affected areas dry and clean.  Take over-the-counter and prescription medicines only as told by your health care provider.  Avoid rubbing or touching blisters and sores. If you do touch blisters or sores: ? Wash your hands thoroughly with soap and water. ? Do not touch your eyes afterward.  To help relieve pain or itching, you may take the following actions as directed by your health care provider: ? Apply a cold, wet cloth (cold compress) to affected areas 4-6 times a day. ? Apply a substance that protects your skin and reduces bleeding (astringent). ? Apply a gel that helps relieve pain  around sores (lidocaine gel). ? Take a warm, shallow bath that cleans the genital area (sitz bath).  Keep all follow-up visits as told by your health care provider. This is important. How is this prevented?  Use condoms. Although anyone can get genital herpes during sexual contact, even with the use of a condom, a condom can provide some protection.  Avoid having multiple sexual partners.  Talk with your sexual partner about any symptoms either of you may have. Also, talk with your partner about any history of STIs.  Get tested for STIs before you have sex. Ask your partner to do the same.  Do not have sexual contact if you have symptoms of genital herpes. Contact a health care provider if:  Your symptoms are not improving with medicine.  Your symptoms return.  You have new symptoms.  You have a fever.  You have abdominal pain.  You have redness, swelling, or pain in your eye.  You notice new sores on other parts of your body.  You are a woman and experience bleeding between menstrual periods.  You have had herpes and you become pregnant or plan to become pregnant. Summary  Genital herpes is a common sexually transmitted infection (STI) that is caused by the herpes simplex virus (HSV) type 1 or type 2.  These viruses are most often spread through sexual contact with an infected person.  You are more likely to develop this condition if you have sex with many partners or you have unprotected sex.  Most people do not have symptoms (asymptomatic) or have mild symptoms that may be mistaken for other skin problems. Symptoms occur as outbreaks that may happen months or years apart.  There is no cure for this condition, but treatment with oral antiviral medicines can reduce symptoms, reduce the chance of spreading the virus to a partner, prevent future outbreaks, or shorten future outbreaks. This information is not intended to replace advice given to you by your health care  provider. Make sure you discuss any questions you have with your health care provider. Document Released: 12/06/2000 Document Revised: 06/15/2018 Document Reviewed: 11/08/2016 Elsevier Patient Education  2020 [employer].

## 2019-10-28 NOTE — MAU Note (Signed)
Pt states she does not think she can urinate due to discomfort and would rather be catheterized

## 2019-11-03 ENCOUNTER — Other Ambulatory Visit: Payer: Medicaid Other

## 2019-11-03 ENCOUNTER — Other Ambulatory Visit: Payer: Self-pay

## 2019-11-03 ENCOUNTER — Ambulatory Visit (INDEPENDENT_AMBULATORY_CARE_PROVIDER_SITE_OTHER): Payer: Medicaid Other | Admitting: Medical

## 2019-11-03 ENCOUNTER — Encounter: Payer: Self-pay | Admitting: Medical

## 2019-11-03 VITALS — BP 106/67 | HR 76 | Temp 98.0°F | Wt 144.3 lb

## 2019-11-03 DIAGNOSIS — O283 Abnormal ultrasonic finding on antenatal screening of mother: Secondary | ICD-10-CM | POA: Diagnosis not present

## 2019-11-03 DIAGNOSIS — Z3493 Encounter for supervision of normal pregnancy, unspecified, third trimester: Secondary | ICD-10-CM

## 2019-11-03 DIAGNOSIS — Z3492 Encounter for supervision of normal pregnancy, unspecified, second trimester: Secondary | ICD-10-CM | POA: Diagnosis not present

## 2019-11-03 DIAGNOSIS — E079 Disorder of thyroid, unspecified: Secondary | ICD-10-CM | POA: Diagnosis not present

## 2019-11-03 DIAGNOSIS — Z3A28 28 weeks gestation of pregnancy: Secondary | ICD-10-CM

## 2019-11-03 NOTE — Progress Notes (Signed)
   PRENATAL VISIT NOTE  Subjective:  Angela Branch is a 23 y.o. [phone] at [redacted]w[redacted]d being seen today for ongoing prenatal care.  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  Patient reports no complaints.  Contractions: Not present. Vag. Bleeding: None.  Movement: Present. Denies leaking of fluid.   The following portions of the patient's history were reviewed and updated as appropriate: allergies, current medications, past family history, past medical history, past social history, past surgical history and problem list.   Objective:   Vitals:   11/03/19 0838  BP: 106/67  Pulse: 76  Temp: 98 F (36.7 C)  Weight: 144 lb 4.8 oz (65.5 kg)    Fetal Status:     Movement: Present     General:  Alert, oriented and cooperative. Patient is in no acute distress.  Skin: Skin is warm and dry. No rash noted.   Cardiovascular: Normal heart rate noted  Respiratory: Normal respiratory effort, no problems with respiration noted  Abdomen: Soft, gravid, appropriate for gestational age.  Pain/Pressure: Absent     Pelvic: Cervical exam deferred        Extremities: Normal range of motion.  Edema: None  Mental Status: Normal mood and affect. Normal behavior. Normal judgment and thought content.   Assessment and Plan:  Pregnancy: [id number] at [redacted]w[redacted]d 1. Thyroid disease - Normal so far, recheck q trimester  - TSH - T3, free - T4, free  2. Abnormal antenatal ultrasound - CPC and EIF noted on Anatomy US  - [name] [facility] OB FOLLOW UP; Future  3. Encounter for supervision of low-risk pregnancy in third trimester - 2 hour GTT today, CBC, HIV, RPR today  -  Declined flu  - Declined Tdap  - Discussed PP IUD as possible MOC, patient still undecided   Preterm labor symptoms and general obstetric precautions including but not limited to vaginal bleeding, contractions, leaking of fluid and fetal movement were reviewed in detail with the patient. Please refer to After Visit Summary for other counseling recommendations.   Return in about 2 weeks (around 11/17/2019) for LOB, Virtual.  Future Appointments  Date Time Provider [place]  11/03/2019  9:15 AM Luvenia Redden, PA-C [facility]    Kerry Hough, PA-C

## 2019-11-03 NOTE — Patient Instructions (Signed)
Fetal Movement Counts Patient Name: ________________________________________________ Patient Due Date: ____________________ What is a fetal movement count?  A fetal movement count is the number of times that you feel your baby move during a certain amount of time. This may also be called a fetal kick count. A fetal movement count is recommended for every pregnant woman. You may be asked to start counting fetal movements as early as week 28 of your pregnancy. Pay attention to when your baby is most active. You may notice your baby's sleep and wake cycles. You may also notice things that make your baby move more. You should do a fetal movement count:  When your baby is normally most active.  At the same time each day. A good time to count movements is while you are resting, after having something to eat and drink. How do I count fetal movements? 1. Find a quiet, comfortable area. Sit, or lie down on your side. 2. Write down the date, the start time and stop time, and the number of movements that you felt between those two times. Take this information with you to your health care visits. 3. For 2 hours, count kicks, flutters, swishes, rolls, and jabs. You should feel at least 10 movements during 2 hours. 4. You may stop counting after you have felt 10 movements. 5. If you do not feel 10 movements in 2 hours, have something to eat and drink. Then, keep resting and counting for 1 hour. If you feel at least 4 movements during that hour, you may stop counting. Contact a health care provider if:  You feel fewer than 4 movements in 2 hours.  Your baby is not moving like he or she usually does. Date: ____________ Start time: ____________ Stop time: ____________ Movements: ____________ Date: ____________ Start time: ____________ Stop time: ____________ Movements: ____________ Date: ____________ Start time: ____________ Stop time: ____________ Movements: ____________ Date: ____________ Start time:  ____________ Stop time: ____________ Movements: ____________ Date: ____________ Start time: ____________ Stop time: ____________ Movements: ____________ Date: ____________ Start time: ____________ Stop time: ____________ Movements: ____________ Date: ____________ Start time: ____________ Stop time: ____________ Movements: ____________ Date: ____________ Start time: ____________ Stop time: ____________ Movements: ____________ Date: ____________ Start time: ____________ Stop time: ____________ Movements: ____________ This information is not intended to replace advice given to you by your health care provider. Make sure you discuss any questions you have with your health care provider. Document Released: 01/08/2007 Document Revised: 12/29/2018 Document Reviewed: 01/18/2016 Elsevier Patient Education  2020 [employer] Braxton Hicks Contractions Contractions of the uterus can occur throughout pregnancy, but they are not always a sign that you are in labor. You may have practice contractions called Braxton Hicks contractions. These false labor contractions are sometimes confused with true labor. What are Braxton Hicks contractions? Braxton Hicks contractions are tightening movements that occur in the muscles of the uterus before labor. Unlike true labor contractions, these contractions do not result in opening (dilation) and thinning of the cervix. Toward the end of pregnancy (32-34 weeks), Braxton Hicks contractions can happen more often and may become stronger. These contractions are sometimes difficult to tell apart from true labor because they can be very uncomfortable. You should not feel embarrassed if you go to the hospital with false labor. Sometimes, the only way to tell if you are in true labor is for your health care provider to look for changes in the cervix. The health care provider will do a physical exam and may monitor your contractions. If you   are not in true labor, the exam should show  that your cervix is not dilating and your water has not broken. If there are no other health problems associated with your pregnancy, it is completely safe for you to be sent home with false labor. You may continue to have Braxton Hicks contractions until you go into true labor. How to tell the difference between true labor and false labor True labor  Contractions last 30-70 seconds.  Contractions become very regular.  Discomfort is usually felt in the top of the uterus, and it spreads to the lower abdomen and low back.  Contractions do not go away with walking.  Contractions usually become more intense and increase in frequency.  The cervix dilates and gets thinner. False labor  Contractions are usually shorter and not as strong as true labor contractions.  Contractions are usually irregular.  Contractions are often felt in the front of the lower abdomen and in the groin.  Contractions may go away when you walk around or change positions while lying down.  Contractions get weaker and are shorter-lasting as time goes on.  The cervix usually does not dilate or become thin. Follow these instructions at home:   Take over-the-counter and prescription medicines only as told by your health care provider.  Keep up with your usual exercises and follow other instructions from your health care provider.  Eat and drink lightly if you think you are going into labor.  If Braxton Hicks contractions are making you uncomfortable: ? Change your position from lying down or resting to walking, or change from walking to resting. ? Sit and rest in a tub of warm water. ? Drink enough fluid to keep your urine pale yellow. Dehydration may cause these contractions. ? Do slow and deep breathing several times an hour.  Keep all follow-up prenatal visits as told by your health care provider. This is important. Contact a health care provider if:  You have a fever.  You have continuous pain in  your abdomen. Get help right away if:  Your contractions become stronger, more regular, and closer together.  You have fluid leaking or gushing from your vagina.  You pass blood-tinged mucus (bloody show).  You have bleeding from your vagina.  You have low back pain that you never had before.  You feel your baby's head pushing down and causing pelvic pressure.  Your baby is not moving inside you as much as it used to. Summary  Contractions that occur before labor are called Braxton Hicks contractions, false labor, or practice contractions.  Braxton Hicks contractions are usually shorter, weaker, farther apart, and less regular than true labor contractions. True labor contractions usually become progressively stronger and regular, and they become more frequent.  Manage discomfort from Braxton Hicks contractions by changing position, resting in a warm bath, drinking plenty of water, or practicing deep breathing. This information is not intended to replace advice given to you by your health care provider. Make sure you discuss any questions you have with your health care provider. Document Released: 04/24/2017 Document Revised: 11/21/2017 Document Reviewed: 04/24/2017 Elsevier Patient Education  2020 Elsevier Inc.  

## 2019-11-04 LAB — T3, FREE: T3, Free: 2.4 pg/mL (ref 2.0–4.4)

## 2019-11-04 LAB — GLUCOSE TOLERANCE, 2 HOURS W/ 1HR
Glucose, 1 hour: 131 mg/dL (ref 65–179)
Glucose, 2 hour: 96 mg/dL (ref 65–152)
Glucose, Fasting: 78 mg/dL (ref 65–91)

## 2019-11-04 LAB — CBC
Hematocrit: 31.3 % — ABNORMAL LOW (ref 34.0–46.6)
Hemoglobin: 10.7 g/dL — ABNORMAL LOW (ref 11.1–15.9)
MCH: 30.7 pg (ref 26.6–33.0)
MCHC: 34.2 g/dL (ref 31.5–35.7)
MCV: 90 fL (ref 79–97)
Platelets: 155 10*3/uL (ref 150–450)
RBC: 3.49 x10E6/uL — ABNORMAL LOW (ref 3.77–5.28)
RDW: 12.5 % (ref 11.7–15.4)
WBC: 9.4 10*3/uL (ref 3.4–10.8)

## 2019-11-04 LAB — TSH: TSH: 0.875 u[IU]/mL (ref 0.450–4.500)

## 2019-11-04 LAB — T4, FREE: Free T4: 1.07 ng/dL (ref 0.82–1.77)

## 2019-11-04 LAB — RPR: RPR Ser Ql: NONREACTIVE

## 2019-11-04 LAB — HIV ANTIBODY (ROUTINE TESTING W REFLEX): HIV Screen 4th Generation wRfx: NONREACTIVE

## 2019-11-08 ENCOUNTER — Encounter: Payer: Self-pay | Admitting: *Deleted

## 2019-11-10 ENCOUNTER — Ambulatory Visit (INDEPENDENT_AMBULATORY_CARE_PROVIDER_SITE_OTHER): Payer: Medicaid Other

## 2019-11-10 DIAGNOSIS — Z148 Genetic carrier of other disease: Secondary | ICD-10-CM

## 2019-11-10 DIAGNOSIS — R55 Syncope and collapse: Secondary | ICD-10-CM

## 2019-11-17 ENCOUNTER — Other Ambulatory Visit: Payer: Self-pay

## 2019-11-17 ENCOUNTER — Telehealth (INDEPENDENT_AMBULATORY_CARE_PROVIDER_SITE_OTHER): Payer: Medicaid Other | Admitting: Advanced Practice Midwife

## 2019-11-17 DIAGNOSIS — Z148 Genetic carrier of other disease: Secondary | ICD-10-CM

## 2019-11-17 DIAGNOSIS — Z3493 Encounter for supervision of normal pregnancy, unspecified, third trimester: Secondary | ICD-10-CM

## 2019-11-17 DIAGNOSIS — O98813 Other maternal infectious and parasitic diseases complicating pregnancy, third trimester: Secondary | ICD-10-CM | POA: Diagnosis not present

## 2019-11-17 DIAGNOSIS — B009 Herpesviral infection, unspecified: Secondary | ICD-10-CM

## 2019-11-17 DIAGNOSIS — Z3A30 30 weeks gestation of pregnancy: Secondary | ICD-10-CM | POA: Diagnosis not present

## 2019-11-17 NOTE — Progress Notes (Signed)
I connected with  Angela Branch on 11/17/19 at  1:35 PM EST by telephone and verified that I am speaking with the correct person using two identifiers.   I discussed the limitations, risks, security and privacy concerns of performing an evaluation and management service by telephone and the availability of in person appointments. I also discussed with the patient that there may be a patient responsible charge related to this service. The patient expressed understanding and agreed to proceed.  [place], CMA 11/17/2019  1:42 PM   No Blood pressure Cuff states its packed up.  No conserns Will be telephone not virtual

## 2019-11-17 NOTE — Progress Notes (Signed)
   TELEHEALTH VIRTUAL OBSTETRICS VISIT ENCOUNTER NOTE  I connected with Angela Branch on 11/17/19 at  1:35 PM EST by telephone at home and verified that I am speaking with the correct person using two identifiers.   I discussed the limitations, risks, security and privacy concerns of performing an evaluation and management service by telephone and the availability of in person appointments. I also discussed with the patient that there may be a patient responsible charge related to this service. The patient expressed understanding and agreed to proceed.  Subjective:  Angela Branch is a 23 y.o. [phone] at [redacted]w[redacted]d being followed for ongoing prenatal care.  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  Patient reports no complaints. Reports fetal movement. Denies any contractions, bleeding or leaking of fluid.   The following portions of the patient's history were reviewed and updated as appropriate: allergies, current medications, past family history, past medical history, past social history, past surgical history and problem list.   Objective:   General:  Alert, oriented and cooperative.   Mental Status: Normal mood and affect perceived. Normal judgment and thought content.  Rest of physical exam deferred due to type of encounter  Assessment and Plan:  Pregnancy: [id number] at [redacted]w[redacted]d 1. 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 Encounter for supervision of low-risk pregnancy in third trimester - routine care  - discussed immediate PP IUD placement today, and leaning towards to having this done. She is still deciding between [place] or  [facility]   3. Herpes simplex type 2 infection - will need suppression at 35 weeks   Preterm labor symptoms and general obstetric precautions including but not limited to vaginal bleeding, contractions, leaking of fluid and fetal movement were reviewed in detail with the patient.  I discussed the assessment and treatment plan with the patient. The patient was provided an opportunity to ask questions and all were answered. The patient agreed with the plan and demonstrated an understanding of the instructions. The patient was advised to call back or seek an in-person office evaluation/go to [facility] at [facility] for any urgent or concerning symptoms. Please refer to After Visit Summary for other counseling recommendations.   I provided 12 minutes of non-face-to-face time during this encounter.  Return in about 2 weeks (around 12/01/2019) for virtual visit .  Future Appointments  Date Time Provider [place]  12/03/2019  1:45 PM [name] 2 [facility]     Marcille Buffy DNP, CNM  11/17/19  1:54 PM  [facility]

## 2019-12-01 ENCOUNTER — Telehealth (INDEPENDENT_AMBULATORY_CARE_PROVIDER_SITE_OTHER): Payer: Medicaid Other | Admitting: Medical

## 2019-12-01 ENCOUNTER — Encounter: Payer: Self-pay | Admitting: Medical

## 2019-12-01 DIAGNOSIS — O98513 Other viral diseases complicating pregnancy, third trimester: Secondary | ICD-10-CM | POA: Diagnosis not present

## 2019-12-01 DIAGNOSIS — E079 Disorder of thyroid, unspecified: Secondary | ICD-10-CM | POA: Diagnosis not present

## 2019-12-01 DIAGNOSIS — Z3A32 32 weeks gestation of pregnancy: Secondary | ICD-10-CM

## 2019-12-01 DIAGNOSIS — B009 Herpesviral infection, unspecified: Secondary | ICD-10-CM | POA: Diagnosis not present

## 2019-12-01 DIAGNOSIS — Z3493 Encounter for supervision of normal pregnancy, unspecified, third trimester: Secondary | ICD-10-CM

## 2019-12-01 MED ORDER — VALACYCLOVIR HCL 1 G PO TABS: 1000.0000 mg | ORAL_TABLET | Freq: Every day | ORAL | 1 refills | Status: DC

## 2019-12-01 NOTE — Progress Notes (Signed)
   TELEHEALTH VIRTUAL OBSTETRICS VISIT ENCOUNTER NOTE  I connected with Angela Branch on 12/01/19 at  4:15 PM EST by telephone at home and verified that I am speaking with the correct person using two identifiers.   I discussed the limitations, risks, security and privacy concerns of performing an evaluation and management service by telephone and the availability of in person appointments. I also discussed with the patient that there may be a patient responsible charge related to this service. The patient expressed understanding and agreed to proceed.  Subjective:  Angela Branch is a 23 y.o. [phone] at [redacted]w[redacted]d being followed for ongoing prenatal care.  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  Patient reports no complaints. Reports fetal movement. Denies any contractions, bleeding or leaking of fluid.   The following portions of the patient's history were reviewed and updated as appropriate: allergies, current medications, past family history, past medical history, past social history, past surgical history and problem list.   Objective:   General:  Alert, oriented and cooperative.   Mental Status: Normal mood and affect perceived. Normal judgment and thought content.  Rest of physical exam deferred due to type of encounter  Assessment and Plan:  Pregnancy: [id number] at [redacted]w[redacted]d 1. Encounter for supervision of low-risk pregnancy in third trimester - Doing well, no complaints  - Follow-up US 12/03/19  2. Herpes simplex type 2 infection - Rx for Valtrex sent to patient's pharmacy to start at 35 weeks   3. Thyroid disease - Checked  at last visit for third trimester - normal   Preterm labor symptoms and general obstetric precautions including but not limited to vaginal bleeding, contractions, leaking of fluid and fetal movement were reviewed in detail with the patient.  I discussed the assessment and treatment plan with the patient. The patient was provided an opportunity to ask questions and all were answered. The patient agreed with the plan and demonstrated an understanding of the instructions. The patient was advised to call back or seek an in-person office evaluation/go to MAU at [facility] for any urgent or concerning symptoms. Please refer to After Visit Summary for other counseling recommendations.   I provided 8 minutes of non-face-to-face time during this encounter.  Return in about 4 weeks (around 12/29/2019) for [facility], In-Person.  Future Appointments  Date Time Provider [place]  12/01/2019  4:15 PM Danielle Rankin [facility]  12/03/2019  1:45 PM [name] 2 WH-MFCUS MFC-US    Kerry Hough, [place] for [employer], [place]

## 2019-12-01 NOTE — Patient Instructions (Signed)
Fetal Movement Counts Patient Name: ________________________________________________ Patient Due Date: ____________________ What is a fetal movement count?  A fetal movement count is the number of times that you feel your baby move during a certain amount of time. This may also be called a fetal kick count. A fetal movement count is recommended for every pregnant woman. You may be asked to start counting fetal movements as early as week 28 of your pregnancy. Pay attention to when your baby is most active. You may notice your baby's sleep and wake cycles. You may also notice things that make your baby move more. You should do a fetal movement count:  When your baby is normally most active.  At the same time each day. A good time to count movements is while you are resting, after having something to eat and drink. How do I count fetal movements? 1. Find a quiet, comfortable area. Sit, or lie down on your side. 2. Write down the date, the start time and stop time, and the number of movements that you felt between those two times. Take this information with you to your health care visits. 3. For 2 hours, count kicks, flutters, swishes, rolls, and jabs. You should feel at least 10 movements during 2 hours. 4. You may stop counting after you have felt 10 movements. 5. If you do not feel 10 movements in 2 hours, have something to eat and drink. Then, keep resting and counting for 1 hour. If you feel at least 4 movements during that hour, you may stop counting. Contact a health care provider if:  You feel fewer than 4 movements in 2 hours.  Your baby is not moving like he or she usually does. Date: ____________ Start time: ____________ Stop time: ____________ Movements: ____________ Date: ____________ Start time: ____________ Stop time: ____________ Movements: ____________ Date: ____________ Start time: ____________ Stop time: ____________ Movements: ____________ Date: ____________ Start time:  ____________ Stop time: ____________ Movements: ____________ Date: ____________ Start time: ____________ Stop time: ____________ Movements: ____________ Date: ____________ Start time: ____________ Stop time: ____________ Movements: ____________ Date: ____________ Start time: ____________ Stop time: ____________ Movements: ____________ Date: ____________ Start time: ____________ Stop time: ____________ Movements: ____________ Date: ____________ Start time: ____________ Stop time: ____________ Movements: ____________ This information is not intended to replace advice given to you by your health care provider. Make sure you discuss any questions you have with your health care provider. Document Released: 01/08/2007 Document Revised: 12/29/2018 Document Reviewed: 01/18/2016 Elsevier Patient Education  2020 [employer] Braxton Hicks Contractions Contractions of the uterus can occur throughout pregnancy, but they are not always a sign that you are in labor. You may have practice contractions called Braxton Hicks contractions. These false labor contractions are sometimes confused with true labor. What are Braxton Hicks contractions? Braxton Hicks contractions are tightening movements that occur in the muscles of the uterus before labor. Unlike true labor contractions, these contractions do not result in opening (dilation) and thinning of the cervix. Toward the end of pregnancy (32-34 weeks), Braxton Hicks contractions can happen more often and may become stronger. These contractions are sometimes difficult to tell apart from true labor because they can be very uncomfortable. You should not feel embarrassed if you go to the hospital with false labor. Sometimes, the only way to tell if you are in true labor is for your health care provider to look for changes in the cervix. The health care provider will do a physical exam and may monitor your contractions. If you   are not in true labor, the exam should show  that your cervix is not dilating and your water has not broken. If there are no other health problems associated with your pregnancy, it is completely safe for you to be sent home with false labor. You may continue to have Braxton Hicks contractions until you go into true labor. How to tell the difference between true labor and false labor True labor  Contractions last 30-70 seconds.  Contractions become very regular.  Discomfort is usually felt in the top of the uterus, and it spreads to the lower abdomen and low back.  Contractions do not go away with walking.  Contractions usually become more intense and increase in frequency.  The cervix dilates and gets thinner. False labor  Contractions are usually shorter and not as strong as true labor contractions.  Contractions are usually irregular.  Contractions are often felt in the front of the lower abdomen and in the groin.  Contractions may go away when you walk around or change positions while lying down.  Contractions get weaker and are shorter-lasting as time goes on.  The cervix usually does not dilate or become thin. Follow these instructions at home:   Take over-the-counter and prescription medicines only as told by your health care provider.  Keep up with your usual exercises and follow other instructions from your health care provider.  Eat and drink lightly if you think you are going into labor.  If Braxton Hicks contractions are making you uncomfortable: ? Change your position from lying down or resting to walking, or change from walking to resting. ? Sit and rest in a tub of warm water. ? Drink enough fluid to keep your urine pale yellow. Dehydration may cause these contractions. ? Do slow and deep breathing several times an hour.  Keep all follow-up prenatal visits as told by your health care provider. This is important. Contact a health care provider if:  You have a fever.  You have continuous pain in  your abdomen. Get help right away if:  Your contractions become stronger, more regular, and closer together.  You have fluid leaking or gushing from your vagina.  You pass blood-tinged mucus (bloody show).  You have bleeding from your vagina.  You have low back pain that you never had before.  You feel your baby's head pushing down and causing pelvic pressure.  Your baby is not moving inside you as much as it used to. Summary  Contractions that occur before labor are called Braxton Hicks contractions, false labor, or practice contractions.  Braxton Hicks contractions are usually shorter, weaker, farther apart, and less regular than true labor contractions. True labor contractions usually become progressively stronger and regular, and they become more frequent.  Manage discomfort from Braxton Hicks contractions by changing position, resting in a warm bath, drinking plenty of water, or practicing deep breathing. This information is not intended to replace advice given to you by your health care provider. Make sure you discuss any questions you have with your health care provider. Document Released: 04/24/2017 Document Revised: 11/21/2017 Document Reviewed: 04/24/2017 Elsevier Patient Education  2020 Elsevier Inc.  

## 2019-12-01 NOTE — Progress Notes (Signed)
+  I connected with  [name] on 12/01/19 at  4:15 PM EST by telephone and verified that I am speaking with the correct person using two identifiers.   I discussed the limitations, risks, security and privacy concerns of performing an evaluation and management service by telephone and the availability of in person appointments. I also discussed with the patient that there may be a patient responsible charge related to this service. The patient expressed understanding and agreed to proceed.  Bethanne Ginger, CMA 12/01/2019  4:00 PM

## 2019-12-03 ENCOUNTER — Ambulatory Visit (HOSPITAL_COMMUNITY): Payer: Medicaid Other | Admitting: *Deleted

## 2019-12-03 ENCOUNTER — Other Ambulatory Visit: Payer: Self-pay

## 2019-12-03 ENCOUNTER — Encounter (HOSPITAL_COMMUNITY): Payer: Self-pay

## 2019-12-03 ENCOUNTER — Ambulatory Visit (HOSPITAL_COMMUNITY)
Admission: RE | Admit: 2019-12-03 | Discharge: 2019-12-03 | Disposition: A | Discharge status: Home / Self Care | Payer: Medicaid Other | Source: Ambulatory Visit | Attending: Obstetrics and Gynecology | Admitting: Obstetrics and Gynecology

## 2019-12-03 DIAGNOSIS — O99283 Endocrine, nutritional and metabolic diseases complicating pregnancy, third trimester: Secondary | ICD-10-CM | POA: Diagnosis not present

## 2019-12-03 DIAGNOSIS — B009 Herpesviral infection, unspecified: Secondary | ICD-10-CM

## 2019-12-03 DIAGNOSIS — O283 Abnormal ultrasonic finding on antenatal screening of mother: Secondary | ICD-10-CM | POA: Insufficient documentation

## 2019-12-03 DIAGNOSIS — E079 Disorder of thyroid, unspecified: Secondary | ICD-10-CM | POA: Diagnosis not present

## 2019-12-03 DIAGNOSIS — Z148 Genetic carrier of other disease: Secondary | ICD-10-CM | POA: Diagnosis not present

## 2019-12-03 DIAGNOSIS — Z3493 Encounter for supervision of normal pregnancy, unspecified, third trimester: Secondary | ICD-10-CM | POA: Insufficient documentation

## 2019-12-03 DIAGNOSIS — Z3A32 32 weeks gestation of pregnancy: Secondary | ICD-10-CM

## 2019-12-03 DIAGNOSIS — O09893 Supervision of other high risk pregnancies, third trimester: Secondary | ICD-10-CM | POA: Diagnosis not present

## 2019-12-03 DIAGNOSIS — Z362 Encounter for other antenatal screening follow-up: Secondary | ICD-10-CM

## 2019-12-24 NOTE — L&D Delivery Note (Addendum)
OB/GYN Faculty Practice Delivery Note  DEBI COUSIN is a 24 y.o. [id number] s/p NSVD at [redacted]w[redacted]d. She was admitted for latent labor.   ROM: 0h 90m with clear fluid GBS Status: negative Maximum Maternal Temperature: 98.75F  Labor Progress: Patient was admitted at 4 cm. She was augmented with pitocin and received an epidural. She progressed to 8 cm, was AROMed, and delivered shortly after.  Delivery Date/Time: 01/12/20, 2524 Delivery: Called to room and patient was complete and pushing. Head delivered ROA with a right compound hand. No nuchal cord present. Shoulder and body delivered in usual fashion. Infant with spontaneous cry, placed on mother's abdomen, dried and stimulated. Cord clamped x 2 after 1-minute delay, and cut by FOB under my direct supervision. Cord blood drawn. Cord avulsed during gentle cord traction and the placenta was manually removed under the supervision of Dr. Nehemiah Settle. Fundus firm with massage and Pitocin. Labia, perineum, vagina, and cervix were inspected, and bilateral periurethral.   2 grams of Ancef was given due to manual removal of placenta.  Post placental IUD inserted, please see separate note for details.  Placenta: manually removed, appeared to be grossly intact, no remnants on repeated sweep Complications: cord avulsion, manual removal of placenta Lacerations: periurethral abrasions, hemostatic and not repaired EBL: 225 mL Analgesia: epidural  Postpartum Planning [x]  message to sent to schedule follow-up  [x]  vaccines UTD  Infant: female  APGARs 9, 9  weight per medical record  Merilyn Baba, DO OB/GYN Fellow, Faculty Practice

## 2019-12-28 ENCOUNTER — Inpatient Hospital Stay (HOSPITAL_BASED_OUTPATIENT_CLINIC_OR_DEPARTMENT_OTHER): Payer: Medicaid Other

## 2019-12-28 ENCOUNTER — Inpatient Hospital Stay (HOSPITAL_COMMUNITY)
Admission: AD | Admit: 2019-12-28 | Discharge: 2019-12-28 | Disposition: A | Discharge status: Home / Self Care | Payer: Medicaid Other | Attending: Obstetrics and Gynecology | Admitting: Obstetrics and Gynecology

## 2019-12-28 ENCOUNTER — Encounter (HOSPITAL_COMMUNITY): Payer: Self-pay | Admitting: Obstetrics and Gynecology

## 2019-12-28 ENCOUNTER — Other Ambulatory Visit: Payer: Self-pay

## 2019-12-28 DIAGNOSIS — O368190 Decreased fetal movements, unspecified trimester, not applicable or unspecified: Secondary | ICD-10-CM

## 2019-12-28 DIAGNOSIS — O368130 Decreased fetal movements, third trimester, not applicable or unspecified: Secondary | ICD-10-CM | POA: Insufficient documentation

## 2019-12-28 DIAGNOSIS — M545 Low back pain, unspecified: Secondary | ICD-10-CM

## 2019-12-28 DIAGNOSIS — Z3689 Encounter for other specified antenatal screening: Secondary | ICD-10-CM

## 2019-12-28 DIAGNOSIS — J45909 Unspecified asthma, uncomplicated: Secondary | ICD-10-CM | POA: Insufficient documentation

## 2019-12-28 DIAGNOSIS — Z3A35 35 weeks gestation of pregnancy: Secondary | ICD-10-CM

## 2019-12-28 DIAGNOSIS — O1203 Gestational edema, third trimester: Secondary | ICD-10-CM

## 2019-12-28 DIAGNOSIS — O99513 Diseases of the respiratory system complicating pregnancy, third trimester: Secondary | ICD-10-CM | POA: Insufficient documentation

## 2019-12-28 DIAGNOSIS — M7989 Other specified soft tissue disorders: Secondary | ICD-10-CM | POA: Insufficient documentation

## 2019-12-28 DIAGNOSIS — O26893 Other specified pregnancy related conditions, third trimester: Secondary | ICD-10-CM | POA: Diagnosis not present

## 2019-12-28 LAB — URINALYSIS, ROUTINE W REFLEX MICROSCOPIC
Bilirubin Urine: NEGATIVE
Glucose, UA: NEGATIVE mg/dL
Hgb urine dipstick: NEGATIVE
Ketones, ur: NEGATIVE mg/dL
Nitrite: NEGATIVE
Protein, ur: 30 mg/dL — AB
Specific Gravity, Urine: 1.023 (ref 1.005–1.030)
pH: 6 (ref 5.0–8.0)

## 2019-12-28 MED ORDER — CYCLOBENZAPRINE HCL 10 MG PO TABS: 10.0000 mg | ORAL_TABLET | Freq: Three times a day (TID) | ORAL | 0 refills | Status: DC | PRN

## 2019-12-28 MED ORDER — COMFORT FIT MATERNITY SUPP SM MISC: 1.0000 [IU] | Freq: Every day | 0 refills | Status: DC | PRN

## 2019-12-28 NOTE — MAU Provider Note (Addendum)
History     CSN: [id number]  Arrival date and time: 12/28/19 1421   First Provider Initiated Contact with Patient 12/28/19 1526      Chief Complaint  Patient presents with  . Decreased Fetal Movement  . Abdominal Pain  . Back Pain  . Contractions  . Leg Swelling   [name] is a 24 y.o. [phone] at [redacted]w[redacted]d who presents to MAU for feeling decreased fetal movement beginning yesterday, but pt also reports the baby has "been not moving that much" unless she pokes him, but cannot give a specific time frame for this feeling. Pt reports she has not felt any fetal movement in the past 24hrs, except when she arrived to MAU and the monitors were applied. Pt reports the baby moved a couple of times, but then stopped again. Pt denies feeling softer movements and reports she is feeling less movement overall.  Last food/drink: 1-2hrs ago, [employer] Smoker? no Current medications/supplements: none Recent AFI: 13.32cm 12/03/2019 Anterior placenta? no Doing FKCs? no Problems this pregnancy include: possible thyroid issue. Pt denies prior instances of DFM. Pt denies all risk factors for stillbirth, including, but not limited to: IUGR, placental abruption, infection, genetic/congenital anomalies, fetomaternal hemorrhage, DM, HTN, smoking/drug use, umbilical cord/placental abnormalities, uterine abnormalities, fetal hydrops, arrythmia, platelet dysfunction, IHCP.  Pt reports irregular ctx for the past couple of weeks, and "more frequent in the past month."  Pt also reports leg swelling and general pelvic and LBP. Pt declines offer for RX for pregnancy support belt.  Pt denies VB, LOF, vaginal discharge/odor/itching.  Allergies? azithromycin Prenatal care provider/next appt? [facility], 12/28/2018   OB History    Gravida  4   Para  2   Term  2   Preterm  0   AB  1   Living  2     SAB  1   TAB  0   Ectopic  0   Multiple  0   Live Births  2           Past Medical History:   Diagnosis Date  . Adjustment disorder with mixed anxiety and depressed mood 04/29/2016  . Anemia   . Anxiety   . Asthma   . Asthma 06/12/2012  . 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 Eczema 10/04/2014  . Faintness 10/05/2015  . Family dynamics problem 03/25/2013  . Fetal tachycardia affecting management of mother 09/21/2018  . GBS (group B Streptococcus carrier), +RV culture, currently pregnant 11/14/2015  . GERD (gastroesophageal reflux disease) 09/21/2015  . H/O scabies 11/14/2015   Treated with permetherin   . 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 Loss of weight 03/25/2013  . Low back pain 09/20/2013  . Menorrhagia 09/28/2013  . Migraine headache 02/23/2014  . Sleep apnea 04/14/2014  . Substance abuse ([place]) 03/24/2015   + cannabis   . Supervision of low-risk pregnancy 06/28/2019    Nursing Staff Provider Office Location  [facility] Dating   early u/s Language  English Anatomy US   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  Flu Vaccine   Genetic Screen  NIPS: low risk female      TDaP vaccine    Hgb A1C or  GTT  Third trimester  Rhogam  n/a   LAB RESULTS  Feeding Plan Breast/Bottle Blood Type A/Positive/-- (07/16 1206)  Contraception undecided Antibody Negative (07/16  . Thyroid disease     Past Surgical History:  Procedure Laterality  Date  . TOOTH EXTRACTION      Family History  Problem Relation Age of Onset  . Asthma Father   . Diabetes Maternal Grandmother   . Hyperlipidemia Paternal Grandfather   . Heart disease Paternal Grandfather   . Cancer - Colon Maternal Grandfather   . Mental illness Neg Hx   . Birth defects Neg Hx   . Kidney disease Neg Hx   . Hypertension Neg Hx     Social History   Tobacco Use  . Smoking status: Never Smoker  . Smokeless tobacco: Never Used  Substance Use Topics  . Alcohol use: No    Alcohol/week: 0.0 standard drinks  . Drug use: No    Allergies:  Allergies  Allergen Reactions  .  Azithromycin Diarrhea    Diarrhea, nausea/vomiting, IV burns vein Diarrhea, nausea/vomiting, IV burns vein    No medications prior to admission.    Review of Systems  Constitutional: Negative for chills, diaphoresis, fatigue and fever.  Eyes: Negative for visual disturbance.  Respiratory: Negative for shortness of breath.   Cardiovascular: Negative for chest pain.  Gastrointestinal: Negative for abdominal pain, constipation, diarrhea, nausea and vomiting.  Genitourinary: Positive for pelvic pain. Negative for dysuria, flank pain, frequency, urgency, vaginal bleeding and vaginal discharge.  Musculoskeletal: Positive for back pain (LBP).       Bilateral leg swelling.  Neurological: Negative for dizziness, weakness, light-headedness and headaches.   Physical Exam   Blood pressure 130/71, pulse 93, temperature 98.2 F (36.8 C), temperature source Oral, resp. rate 16, weight 68.7 kg, last menstrual period 04/12/2019, SpO2 99 %, unknown if currently breastfeeding.  Patient Vitals for the past 24 hrs:  BP Temp Temp src Pulse Resp SpO2 Weight  12/28/19 1731 130/71 -- -- 93 -- -- --  12/28/19 1512 107/64 -- -- (!) 108 -- -- --  12/28/19 1448 115/71 98.2 F (36.8 C) Oral (!) 101 16 99 % 68.7 kg   Physical Exam  Constitutional: She is oriented to person, place, and time. She appears well-developed and well-nourished. No distress.  HENT:  Head: Normocephalic and atraumatic.  Respiratory: Effort normal.  GI: Soft.  Genitourinary: There is no rash, tenderness or lesion on the right labia. There is no rash, tenderness or lesion on the left labia.    No vaginal discharge.     Genitourinary Comments: Dilation: 1.5 Effacement (%): Thick Cervical Position: Middle Presentation: Vertex Exam by:: Donia Ast, NP   Neurological: She is alert and oriented to person, place, and time.  Skin: Skin is warm and dry. She is not diaphoretic.  Psychiatric: She has a normal mood and affect. Her  behavior is normal. Judgment and thought content normal.   Results for orders placed or performed during the hospital encounter of 12/28/19 (from the past 24 hour(s))  Urinalysis, Routine w reflex microscopic     Status: Abnormal   Collection Time: 12/28/19  2:52 PM  Result Value Ref Range   Color, Urine YELLOW YELLOW   APPearance HAZY (A) CLEAR   Specific Gravity, Urine 1.023 1.005 - 1.030   pH 6.0 5.0 - 8.0   Glucose, UA NEGATIVE NEGATIVE mg/dL   Hgb urine dipstick NEGATIVE NEGATIVE   Bilirubin Urine NEGATIVE NEGATIVE   Ketones, ur NEGATIVE NEGATIVE mg/dL   Protein, ur 30 (A) NEGATIVE mg/dL   Nitrite NEGATIVE NEGATIVE   Leukocytes,Ua TRACE (A) NEGATIVE   RBC / HPF 0-5 0 - 5 RBC/hpf   WBC, UA 6-10 0 - 5 WBC/hpf  Bacteria, UA FEW (A) NONE SEEN   Squamous Epithelial / LPF 11-20 0 - 5   Mucus PRESENT    MAU Course  Procedures  MDM -DFM -UA: hazy/30PRO/trace leuks/few bacteria, sending urine for culture -EFM: reactive       -baseline: 140       -variability: moderate       -accels: present, 15x15       -decels: present, single early ~1514       -TOCO: few, irregular ctx on monitor that pt minimally feeling -Dilation: 1.5 Effacement (%): Thick Cervical Position: Middle Presentation: Vertex Exam by:: Vernice Jefferson, NP -pt given iced juice, snacks, clicker and advised to press button with each act of fetal movement, pt pressed button 0 times during her stay in MAU. Upon clarification, pt states the baby is moving and is back to moving normally, but she could not find the clicker and did not call the RN for assistance. -BPP 8/8 -after pt returns from [name], [place] reports pt unwilling to stay on monitor  -leg swelling extremely minimal, equal bilaterally -general pelvic and LBP, pt declines offer for pregnancy belt stating her insurance will not cover it, but accepts after discussion and will try to call store prior to going to make sure belt is covered by insurance -pt requesting  pain medication for bedtime to assist with pain  -pt discharged to home in stable condition  Orders Placed This Encounter  Procedures  . Culture, OB Urine    Standing Status:   Standing    Number of Occurrences:   1  . [name] MFM FETAL BPP WO NON STRESS    Standing Status:   Standing    Number of Occurrences:   1    Order Specific Question:   Symptom/Reason for Exam    Answer:   Decreased fetal movement [[id number]]  . Urinalysis, Routine w reflex microscopic    Standing Status:   Standing    Number of Occurrences:   1  . Discharge patient    Order Specific Question:   Discharge disposition    Answer:   01-Home or Self Care [1]    Order Specific Question:   Discharge patient date    Answer:   12/28/2019   Assessment and Plan   1. Decreased fetal movement   2. [redacted] weeks gestation of pregnancy   3. NST (non-stress test) reactive   4. Pelvic pain affecting pregnancy in third trimester, antepartum   5. Low back pain during pregnancy in third trimester   6. Swelling of lower extremity during pregnancy in third trimester    Allergies as of 12/28/2019      Reactions   Azithromycin Diarrhea   Diarrhea, nausea/vomiting, IV burns vein Diarrhea, nausea/vomiting, IV burns vein      Medication List    TAKE these medications   AMBULATORY NON FORMULARY MEDICATION 1 Device by Other route as needed. BP cuff- Regular size Monitored regularly at home ICD 10: Z34.90   [place] 1 Units by Does not apply route daily as needed.   cyclobenzaprine 10 MG tablet Commonly known as: FLEXERIL Take 1 tablet (10 mg total) by mouth 3 (three) times daily as needed.   lidocaine 2 % jelly Commonly known as: XYLOCAINE Apply 1 application topically as needed.   valACYclovir 1000 MG tablet Commonly known as: Valtrex Take 1 tablet (1,000 mg total) by mouth daily.      -will call with culture results, if positive -  discussed that mothers do not perceive 100% of fetal movement, and  there is wide variation of mother perception of fetal movement -discussed that fetal movement varies somewhat depending on the time of day and gestational age and there is a wide variety of normal movement among healthy babies -discussed that frequency of movement typically increases from morning to night, with peak activity late at night -discussed that fetal sleep cycles become longer with advancing gestation and can last about 20-56minutes -pt advised to contact HCP/return to MAU immediately if noticing a decrease in fetal movement compared to what is normal -discussed FKCs vs. monitoring movements; can either do fetal kick counting, or just be aware of what is normal for baby in terms of movement; no one method is better than the other -FKC: at least 10 fetal movements (FMs) over up to two hours when at rest and focused on counting -pt advised to keep OB appt tomorrow and to try Flexeril tonight and discuss pain with them at appt tomorrow if it does not work for her -RX Flexeril -discussed compression stockings/socks for leg swelling -discussed normal expectations/aches/pains in third trimester -discussed labor/pain/return MAU precautions -pt discharged to home in stable condition  Elmyra Ricks E Zackari Ruane 12/28/2019, 6:21 PM

## 2019-12-28 NOTE — Discharge Instructions (Signed)
Abdominal Pain During Pregnancy  Abdominal pain is common during pregnancy, and has many possible causes. Some causes are more serious than others, and sometimes the cause is not known. Abdominal pain can be a sign that labor is starting. It can also be caused by normal growth and stretching of muscles and ligaments during pregnancy. Always tell your health care provider if you have any abdominal pain. Follow these instructions at home:  Do not have sex or put anything in your vagina until your pain goes away completely.  Get plenty of rest until your pain improves.  Drink enough fluid to keep your urine pale yellow.  Take over-the-counter and prescription medicines only as told by your health care provider.  Keep all follow-up visits as told by your health care provider. This is important. Contact a health care provider if:  Your pain continues or gets worse after resting.  You have lower abdominal pain that: ? Comes and goes at regular intervals. ? Spreads to your back. ? Is similar to menstrual cramps.  You have pain or burning when you urinate. Get help right away if:  You have a fever or chills.  You have vaginal bleeding.  You are leaking fluid from your vagina.  You are passing tissue from your vagina.  You have vomiting or diarrhea that lasts for more than 24 hours.  Your baby is moving less than usual.  You feel very weak or faint.  You have shortness of breath.  You develop severe pain in your upper abdomen. Summary  Abdominal pain is common during pregnancy, and has many possible causes.  If you experience abdominal pain during pregnancy, tell your health care provider right away.  Follow your health care provider's home care instructions and keep all follow-up visits as directed. This information is not intended to replace advice given to you by your health care provider. Make sure you discuss any questions you have with your health care  provider. Document Revised: 03/29/2019 Document Reviewed: 03/13/2017 Elsevier Patient Education  [place]: You are not alone, Seventy-five percent of women have some sort of abdominal or back pain at some point in their pregnancy. Your baby is growing at a fast pace, which means that your whole body is rapidly trying to adjust to the changes. As your uterus grows, your back may start feeling a bit under stress and this can result in back or abdominal pain that can go from mild, and therefore bearable, to severe pains that will not allow you to sit or lay down comfortably, When it comes to dealing with pregnancy-related pains and cramps, some pregnant women usually prefer natural remedies, which the market is filled with nowadays. For example, wearing a pregnancy support belt can help ease and lessen your discomfort and pain. WHAT ARE THE BENEFITS OF WEARING A PREGNANCY SUPPORT BELT? A pregnancy support belt provides support to the lower portion of the belly taking some of the weight of the growing uterus and distributing to the other parts of your body. It is designed make you comfortable and gives you extra support. Over the years, the pregnancy apparel market has been studying the needs and wants of pregnant women and they have come up with the most comfortable pregnancy support belts that woman could ever ask for. In fact, you will no longer have to wear a stretched-out or bulky pregnancy belt that is visible underneath your clothes and makes you feel even more uncomfortable.  Nowadays, a pregnancy support belt is made of comfortable and stretchy materials that will not irritate your skin but will actually make you feel at ease and you will not even notice you are wearing it. They are easy to put on and adjust during the day and can be worn at night for additional support.  BENEFITS: . Relives Back pain . Relieves Abdominal Muscle and Leg Pain . Stabilizes the  Pelvic Ring . Offers a Cushioned Abdominal Lift Pad . Relieves pressure on the Sciatic Nerve Within Minutes WHERE TO GET YOUR PREGNANCY BELT: [employer] [phone], [place] [address]   Signs and Symptoms of Labor Labor is your body's natural process of moving your baby, placenta, and umbilical cord out of your uterus. The process of labor usually starts when your baby is full-term, between 76 and 40 weeks of pregnancy. How will I know when I am close to going into labor? As your body prepares for labor and the birth of your baby, you may notice the following symptoms in the weeks and days before true labor starts:  Having a strong desire to get your home ready to receive your new baby. This is called nesting. Nesting may be a sign that labor is approaching, and it may occur several weeks before birth. Nesting may involve cleaning and organizing your home.  Passing a small amount of thick, bloody mucus out of your vagina (normal bloody show or losing your mucus plug). This may happen more than a week before labor begins, or it might occur right before labor begins as the opening of the cervix starts to widen (dilate). For some women, the entire mucus plug passes at once. For others, smaller portions of the mucus plug may gradually pass over several days.  Your baby moving (dropping) lower in your pelvis to get into position for birth (lightening). When this happens, you may feel more pressure on your bladder and pelvic bone and less pressure on your ribs. This may make it easier to breathe. It may also cause you to need to urinate more often and have problems with bowel movements.  Having "practice contractions" (Braxton Hicks contractions) that occur at irregular (unevenly spaced) intervals that are more than 10 minutes apart. This is also called false labor. False labor contractions are common after exercise or sexual activity, and they will stop if you  change position, rest, or drink fluids. These contractions are usually mild and do not get stronger over time. They may feel like: ? A backache or back pain. ? Mild cramps, similar to menstrual cramps. ? Tightening or pressure in your abdomen. Other early symptoms that labor may be starting soon include:  Nausea or loss of appetite.  Diarrhea.  Having a sudden burst of energy, or feeling very tired.  Mood changes.  Having trouble sleeping. How will I know when labor has begun? Signs that true labor has begun may include:  Having contractions that come at regular (evenly spaced) intervals and increase in intensity. This may feel like more intense tightening or pressure in your abdomen that moves to your back. ? Contractions may also feel like rhythmic pain in your upper thighs or back that comes and goes at regular intervals. ? For first-time mothers, this change in intensity of contractions often occurs at a more gradual pace. ? Women who have given birth before may notice a more rapid progression of contraction changes.  Having a feeling of pressure in the  vaginal area.  Your water breaking (rupture of membranes). This is when the sac of fluid that surrounds your baby breaks. When this happens, you will notice fluid leaking from your vagina. This may be clear or blood-tinged. Labor usually starts within 24 hours of your water breaking, but it may take longer to begin. ? Some women notice this as a gush of fluid. ? Others notice that their underwear repeatedly becomes damp. Follow these instructions at home:   When labor starts, or if your water breaks, call your health care provider or nurse care line. Based on your situation, they will determine when you should go in for an exam.  When you are in early labor, you may be able to rest and manage symptoms at home. Some strategies to try at home include: ? Breathing and relaxation techniques. ? Taking a warm bath or  shower. ? Listening to music. ? Using a heating pad on the lower back for pain. If you are directed to use heat:  Place a towel between your skin and the heat source.  Leave the heat on for 20-30 minutes.  Remove the heat if your skin turns bright red. This is especially important if you are unable to feel pain, heat, or cold. You may have a greater risk of getting burned. Get help right away if:  You have painful, regular contractions that are 5 minutes apart or less.  Labor starts before you are [redacted] weeks along in your pregnancy.  You have a fever.  You have a headache that does not go away.  You have bright red blood coming from your vagina.  You do not feel your baby moving.  You have a sudden onset of: ? Severe headache with vision problems. ? Nausea, vomiting, or diarrhea. ? Chest pain or shortness of breath. These symptoms may be an emergency. If your health care provider recommends that you go to the hospital or birth center where you plan to deliver, do not drive yourself. Have someone else drive you, or call emergency services (911 in the [place] Summary  Labor is your body's natural process of moving your baby, placenta, and umbilical cord out of your uterus.  The process of labor usually starts when your baby is full-term, between 25 and 40 weeks of pregnancy.  When labor starts, or if your water breaks, call your health care provider or nurse care line. Based on your situation, they will determine when you should go in for an exam. This information is not intended to replace advice given to you by your health care provider. Make sure you discuss any questions you have with your health care provider. Document Revised: 09/08/2017 Document Reviewed: 05/16/2017 Elsevier Patient Education  [place] of Pregnancy The third trimester is from week 28 through week 40 (months 7 through 9). The third trimester is a time when the unborn baby (fetus) is  growing rapidly. At the end of the ninth month, the fetus is about 20 inches in length and weighs 6-10 pounds. Body changes during your third trimester Your body will continue to go through many changes during pregnancy. The changes vary from woman to woman. During the third trimester:  Your weight will continue to increase. You can expect to gain 25-35 pounds (11-16 kg) by the end of the pregnancy.  You may begin to get stretch marks on your hips, abdomen, and breasts.  You may urinate more often because the fetus is moving lower into  your pelvis and pressing on your bladder.  You may develop or continue to have heartburn. This is caused by increased hormones that slow down muscles in the digestive tract.  You may develop or continue to have constipation because increased hormones slow digestion and cause the muscles that push waste through your intestines to relax.  You may develop hemorrhoids. These are swollen veins (varicose veins) in the rectum that can itch or be painful.  You may develop swollen, bulging veins (varicose veins) in your legs.  You may have increased body aches in the pelvis, back, or thighs. This is due to weight gain and increased hormones that are relaxing your joints.  You may have changes in your hair. These can include thickening of your hair, rapid growth, and changes in texture. Some women also have hair loss during or after pregnancy, or hair that feels dry or thin. Your hair will most likely return to normal after your baby is born.  Your breasts will continue to grow and they will continue to become tender. A yellow fluid (colostrum) may leak from your breasts. This is the first milk you are producing for your baby.  Your belly button may stick out.  You may notice more swelling in your hands, face, or ankles.  You may have increased tingling or numbness in your hands, arms, and legs. The skin on your belly may also feel numb.  You may feel short of  breath because of your expanding uterus.  You may have more problems sleeping. This can be caused by the size of your belly, increased need to urinate, and an increase in your body's metabolism.  You may notice the fetus "dropping," or moving lower in your abdomen (lightening).  You may have increased vaginal discharge.  You may notice your joints feel loose and you may have pain around your pelvic bone. What to expect at prenatal visits You will have prenatal exams every 2 weeks until week 36. Then you will have weekly prenatal exams. During a routine prenatal visit:  You will be weighed to make sure you and the baby are growing normally.  Your blood pressure will be taken.  Your abdomen will be measured to track your baby's growth.  The fetal heartbeat will be listened to.  Any test results from the previous visit will be discussed.  You may have a cervical check near your due date to see if your cervix has softened or thinned (effaced).  You will be tested for Group B streptococcus. This happens between 35 and 37 weeks. Your health care provider may ask you:  What your birth plan is.  How you are feeling.  If you are feeling the baby move.  If you have had any abnormal symptoms, such as leaking fluid, bleeding, severe headaches, or abdominal cramping.  If you are using any tobacco products, including cigarettes, chewing tobacco, and electronic cigarettes.  If you have any questions. Other tests or screenings that may be performed during your third trimester include:  Blood tests that check for low iron levels (anemia).  Fetal testing to check the health, activity level, and growth of the fetus. Testing is done if you have certain medical conditions or if there are problems during the pregnancy.  Nonstress test (NST). This test checks the health of your baby to make sure there are no signs of problems, such as the baby not getting enough oxygen. During this test, a belt  is placed around your belly. The baby  is made to move, and its heart rate is monitored during movement. What is false labor? False labor is a condition in which you feel small, irregular tightenings of the muscles in the womb (contractions) that usually go away with rest, changing position, or drinking water. These are called Braxton Hicks contractions. Contractions may last for hours, days, or even weeks before true labor sets in. If contractions come at regular intervals, become more frequent, increase in intensity, or become painful, you should see your health care provider. What are the signs of labor?  Abdominal cramps.  Regular contractions that start at 10 minutes apart and become stronger and more frequent with time.  Contractions that start on the top of the uterus and spread down to the lower abdomen and back.  Increased pelvic pressure and dull back pain.  A watery or bloody mucus discharge that comes from the vagina.  Leaking of amniotic fluid. This is also known as your "water breaking." It could be a slow trickle or a gush. Let your health care provider know if it has a color or strange odor. If you have any of these signs, call your health care provider right away, even if it is before your due date. Follow these instructions at home: Medicines  Follow your health care provider's instructions regarding medicine use. Specific medicines may be either safe or unsafe to take during pregnancy.  Take a prenatal vitamin that contains at least 600 micrograms (mcg) of folic acid.  If you develop constipation, try taking a stool softener if your health care provider approves. Eating and drinking   Eat a balanced diet that includes fresh fruits and vegetables, whole grains, good sources of protein such as meat, eggs, or tofu, and low-fat dairy. Your health care provider will help you determine the amount of weight gain that is right for you.  Avoid raw meat and uncooked cheese.  These carry germs that can cause birth defects in the baby.  If you have low calcium intake from food, talk to your health care provider about whether you should take a daily calcium supplement.  Eat four or five small meals rather than three large meals a day.  Limit foods that are high in fat and processed sugars, such as fried and sweet foods.  To prevent constipation: ? Drink enough fluid to keep your urine clear or pale yellow. ? Eat foods that are high in fiber, such as fresh fruits and vegetables, whole grains, and beans. Activity  Exercise only as directed by your health care provider. Most women can continue their usual exercise routine during pregnancy. Try to exercise for 30 minutes at least 5 days a week. Stop exercising if you experience uterine contractions.  Avoid heavy lifting.  Do not exercise in extreme heat or humidity, or at high altitudes.  Wear low-heel, comfortable shoes.  Practice good posture.  You may continue to have sex unless your health care provider tells you otherwise. Relieving pain and discomfort  Take frequent breaks and rest with your legs elevated if you have leg cramps or low back pain.  Take warm sitz baths to soothe any pain or discomfort caused by hemorrhoids. Use hemorrhoid cream if your health care provider approves.  Wear a good support bra to prevent discomfort from breast tenderness.  If you develop varicose veins: ? Wear support pantyhose or compression stockings as told by your healthcare provider. ? Elevate your feet for 15 minutes, 3-4 times a day. Prenatal care  Write down  your questions. Take them to your prenatal visits.  Keep all your prenatal visits as told by your health care provider. This is important. Safety  Wear your seat belt at all times when driving.  Make a list of emergency phone numbers, including numbers for family, friends, the hospital, and police and fire departments. General instructions  Avoid cat  litter boxes and soil used by cats. These carry germs that can cause birth defects in the baby. If you have a cat, ask someone to clean the litter box for you.  Do not travel far distances unless it is absolutely necessary and only with the approval of your health care provider.  Do not use hot tubs, steam rooms, or saunas.  Do not drink alcohol.  Do not use any products that contain nicotine or tobacco, such as cigarettes and e-cigarettes. If you need help quitting, ask your health care provider.  Do not use any medicinal herbs or unprescribed drugs. These chemicals affect the formation and growth of the baby.  Do not douche or use tampons or scented sanitary pads.  Do not cross your legs for long periods of time.  To prepare for the arrival of your baby: ? Take prenatal classes to understand, practice, and ask questions about labor and delivery. ? Make a trial run to the hospital. ? Visit the hospital and tour the maternity area. ? Arrange for maternity or paternity leave through employers. ? Arrange for family and friends to take care of pets while you are in the hospital. ? Purchase a rear-facing car seat and make sure you know how to install it in your car. ? Pack your hospital bag. ? Prepare the baby's nursery. Make sure to remove all pillows and stuffed animals from the baby's crib to prevent suffocation.  Visit your dentist if you have not gone during your pregnancy. Use a soft toothbrush to brush your teeth and be gentle when you floss. Contact a health care provider if:  You are unsure if you are in labor or if your water has broken.  You become dizzy.  You have mild pelvic cramps, pelvic pressure, or nagging pain in your abdominal area.  You have lower back pain.  You have persistent nausea, vomiting, or diarrhea.  You have an unusual or bad smelling vaginal discharge.  You have pain when you urinate. Get help right away if:  Your water breaks before 37  weeks.  You have regular contractions less than 5 minutes apart before 37 weeks.  You have a fever.  You are leaking fluid from your vagina.  You have spotting or bleeding from your vagina.  You have severe abdominal pain or cramping.  You have rapid weight loss or weight gain.  You have shortness of breath with chest pain.  You notice sudden or extreme swelling of your face, hands, ankles, feet, or legs.  Your baby makes fewer than 10 movements in 2 hours.  You have severe headaches that do not go away when you take medicine.  You have vision changes. Summary  The third trimester is from week 28 through week 40, months 7 through 9. The third trimester is a time when the unborn baby (fetus) is growing rapidly.  During the third trimester, your discomfort may increase as you and your baby continue to gain weight. You may have abdominal, leg, and back pain, sleeping problems, and an increased need to urinate.  During the third trimester your breasts will keep growing and they will  continue to become tender. A yellow fluid (colostrum) may leak from your breasts. This is the first milk you are producing for your baby.  False labor is a condition in which you feel small, irregular tightenings of the muscles in the womb (contractions) that eventually go away. These are called Braxton Hicks contractions. Contractions may last for hours, days, or even weeks before true labor sets in.  Signs of labor can include: abdominal cramps; regular contractions that start at 10 minutes apart and become stronger and more frequent with time; watery or bloody mucus discharge that comes from the vagina; increased pelvic pressure and dull back pain; and leaking of amniotic fluid. This information is not intended to replace advice given to you by your health care provider. Make sure you discuss any questions you have with your health care provider. Document Revised: 04/01/2019 Document Reviewed:  01/14/2017 Elsevier Patient Education  [place].

## 2019-12-28 NOTE — MAU Note (Signed)
Been having contractions, irreg off and on.  Has a hard time breathing when she lays down.  Been having swelling in her lower legs and feet. Been having pain in lower back and lower abd. Hasn't moved at all in like a day.

## 2019-12-29 ENCOUNTER — Encounter: Payer: Medicaid Other | Admitting: Obstetrics & Gynecology

## 2019-12-29 ENCOUNTER — Telehealth: Payer: Self-pay | Admitting: Obstetrics & Gynecology

## 2019-12-29 LAB — CULTURE, OB URINE

## 2019-12-29 NOTE — Telephone Encounter (Signed)
Patient called and stated she was not going to be able to come to her appointment. She said she would call back to reschedule because she will not be able to come in three weeks. I can scheduled the appointment three weeks out. Patient stated she would call back.

## 2020-01-07 ENCOUNTER — Telehealth: Payer: Self-pay | Admitting: Family Medicine

## 2020-01-07 NOTE — Telephone Encounter (Signed)
Received a call from Ms. Wingler  Requesting an appointment. She stated she needed to come in and be seen, but to schedule it after 01/12/20.

## 2020-01-11 ENCOUNTER — Inpatient Hospital Stay (HOSPITAL_COMMUNITY)
Admission: AD | Admit: 2020-01-11 | Discharge: 2020-01-13 | DRG: 807 | Disposition: A | Discharge status: Home / Self Care | Payer: Medicaid Other | Attending: Family Medicine | Admitting: Family Medicine

## 2020-01-11 ENCOUNTER — Other Ambulatory Visit: Payer: Self-pay

## 2020-01-11 ENCOUNTER — Encounter (HOSPITAL_COMMUNITY): Payer: Self-pay | Admitting: Obstetrics and Gynecology

## 2020-01-11 DIAGNOSIS — Z148 Genetic carrier of other disease: Secondary | ICD-10-CM

## 2020-01-11 DIAGNOSIS — E079 Disorder of thyroid, unspecified: Secondary | ICD-10-CM | POA: Diagnosis present

## 2020-01-11 DIAGNOSIS — B009 Herpesviral infection, unspecified: Secondary | ICD-10-CM

## 2020-01-11 DIAGNOSIS — Z3A38 38 weeks gestation of pregnancy: Secondary | ICD-10-CM

## 2020-01-11 DIAGNOSIS — Z3493 Encounter for supervision of normal pregnancy, unspecified, third trimester: Secondary | ICD-10-CM

## 2020-01-11 DIAGNOSIS — O99824 Streptococcus B carrier state complicating childbirth: Secondary | ICD-10-CM | POA: Diagnosis present

## 2020-01-11 DIAGNOSIS — Z20822 Contact with and (suspected) exposure to covid-19: Secondary | ICD-10-CM | POA: Diagnosis present

## 2020-01-11 DIAGNOSIS — O99284 Endocrine, nutritional and metabolic diseases complicating childbirth: Secondary | ICD-10-CM | POA: Diagnosis present

## 2020-01-11 DIAGNOSIS — Z30430 Encounter for insertion of intrauterine contraceptive device: Secondary | ICD-10-CM

## 2020-01-11 MED ORDER — HYDROMORPHONE HCL 1 MG/ML IJ SOLN
0.5000 mg | Freq: Once | INTRAMUSCULAR | Status: AC
  Administered 2020-01-11: 21:00:00 0.5 mg via INTRAMUSCULAR
  Filled 2020-01-11: qty 1

## 2020-01-11 MED ORDER — HYDROMORPHONE HCL 1 MG/ML IJ SOLN: 0.5000 mg | Freq: Once | INTRAMUSCULAR | Status: DC

## 2020-01-11 MED ORDER — OXYCODONE-ACETAMINOPHEN 5-325 MG PO TABS: 1.0000 | ORAL_TABLET | Freq: Once | ORAL | Status: DC

## 2020-01-11 NOTE — MAU Note (Signed)
Presents with ctxs, unsure how often, reports ctxs began 2 days ago.  Reports VB x1 a few hours ago, denies clots.  Denies LOF.  Reports +FM.

## 2020-01-12 ENCOUNTER — Encounter (HOSPITAL_COMMUNITY): Payer: Self-pay | Admitting: Family Medicine

## 2020-01-12 ENCOUNTER — Inpatient Hospital Stay (HOSPITAL_COMMUNITY): Payer: Medicaid Other | Admitting: Anesthesiology

## 2020-01-12 DIAGNOSIS — E079 Disorder of thyroid, unspecified: Secondary | ICD-10-CM | POA: Diagnosis present

## 2020-01-12 DIAGNOSIS — Z30430 Encounter for insertion of intrauterine contraceptive device: Secondary | ICD-10-CM

## 2020-01-12 DIAGNOSIS — O99284 Endocrine, nutritional and metabolic diseases complicating childbirth: Secondary | ICD-10-CM | POA: Diagnosis present

## 2020-01-12 DIAGNOSIS — O9952 Diseases of the respiratory system complicating childbirth: Secondary | ICD-10-CM | POA: Diagnosis not present

## 2020-01-12 DIAGNOSIS — O99824 Streptococcus B carrier state complicating childbirth: Secondary | ICD-10-CM | POA: Diagnosis present

## 2020-01-12 DIAGNOSIS — O411290 Chorioamnionitis, unspecified trimester, not applicable or unspecified: Secondary | ICD-10-CM | POA: Diagnosis not present

## 2020-01-12 DIAGNOSIS — Z3A00 Weeks of gestation of pregnancy not specified: Secondary | ICD-10-CM | POA: Diagnosis not present

## 2020-01-12 DIAGNOSIS — J45909 Unspecified asthma, uncomplicated: Secondary | ICD-10-CM | POA: Diagnosis not present

## 2020-01-12 DIAGNOSIS — Z3A38 38 weeks gestation of pregnancy: Secondary | ICD-10-CM | POA: Diagnosis not present

## 2020-01-12 DIAGNOSIS — O26893 Other specified pregnancy related conditions, third trimester: Secondary | ICD-10-CM | POA: Diagnosis present

## 2020-01-12 DIAGNOSIS — Z20822 Contact with and (suspected) exposure to covid-19: Secondary | ICD-10-CM | POA: Diagnosis present

## 2020-01-12 LAB — CBC
HCT: 37.9 % (ref 36.0–46.0)
Hemoglobin: 12.3 g/dL (ref 12.0–15.0)
MCH: 28.2 pg (ref 26.0–34.0)
MCHC: 32.5 g/dL (ref 30.0–36.0)
MCV: 86.9 fL (ref 80.0–100.0)
Platelets: 169 10*3/uL (ref 150–400)
RBC: 4.36 MIL/uL (ref 3.87–5.11)
RDW: 14.2 % (ref 11.5–15.5)
WBC: 13.2 10*3/uL — ABNORMAL HIGH (ref 4.0–10.5)
nRBC: 0 % (ref 0.0–0.2)

## 2020-01-12 LAB — RPR: RPR Ser Ql: NONREACTIVE

## 2020-01-12 LAB — TYPE AND SCREEN
ABO/RH(D): A POS
Antibody Screen: NEGATIVE

## 2020-01-12 LAB — GROUP B STREP BY PCR: Group B strep by PCR: NEGATIVE

## 2020-01-12 LAB — SARS CORONAVIRUS 2 (TAT 6-24 HRS): SARS Coronavirus 2: NEGATIVE

## 2020-01-12 MED ORDER — EPHEDRINE 5 MG/ML INJ: 10.0000 mg | INTRAVENOUS | Status: DC | PRN

## 2020-01-12 MED ORDER — PARAGARD INTRAUTERINE COPPER IU IUD
INTRAUTERINE_SYSTEM | Freq: Once | INTRAUTERINE | Status: AC
  Administered 2020-01-12: 1 via INTRAUTERINE
  Filled 2020-01-12: qty 1

## 2020-01-12 MED ORDER — IBUPROFEN 600 MG PO TABS
600.0000 mg | ORAL_TABLET | Freq: Four times a day (QID) | ORAL | Status: DC
  Administered 2020-01-13: 600 mg via ORAL
  Filled 2020-01-12 (×2): qty 1

## 2020-01-12 MED ORDER — PROMETHAZINE HCL 25 MG/ML IJ SOLN
12.5000 mg | Freq: Four times a day (QID) | INTRAMUSCULAR | Status: DC | PRN
  Administered 2020-01-12: 01:00:00 12.5 mg via INTRAVENOUS
  Filled 2020-01-12: qty 1

## 2020-01-12 MED ORDER — BENZOCAINE-MENTHOL 20-0.5 % EX AERO
1.0000 "application " | INHALATION_SPRAY | CUTANEOUS | Status: DC | PRN
  Filled 2020-01-12: qty 56

## 2020-01-12 MED ORDER — DIPHENHYDRAMINE HCL 50 MG/ML IJ SOLN: 12.5000 mg | INTRAMUSCULAR | Status: DC | PRN

## 2020-01-12 MED ORDER — ACETAMINOPHEN 325 MG PO TABS: 650.0000 mg | ORAL_TABLET | ORAL | Status: DC | PRN

## 2020-01-12 MED ORDER — LEVONORGESTREL 19.5 MCG/DAY IU IUD: INTRAUTERINE_SYSTEM | Freq: Once | INTRAUTERINE | Status: DC

## 2020-01-12 MED ORDER — OXYTOCIN 40 UNITS IN NORMAL SALINE INFUSION - SIMPLE MED
1.0000 m[IU]/min | INTRAVENOUS | Status: DC
  Administered 2020-01-12: 05:00:00 2 m[IU]/min via INTRAVENOUS

## 2020-01-12 MED ORDER — PRENATAL MULTIVITAMIN CH
1.0000 | ORAL_TABLET | Freq: Every day | ORAL | Status: DC
  Filled 2020-01-12: qty 1

## 2020-01-12 MED ORDER — LIDOCAINE HCL (PF) 1 % IJ SOLN: 30.0000 mL | INTRAMUSCULAR | Status: DC | PRN

## 2020-01-12 MED ORDER — PHENYLEPHRINE 40 MCG/ML (10ML) SYRINGE FOR IV PUSH (FOR BLOOD PRESSURE SUPPORT): 80.0000 ug | PREFILLED_SYRINGE | INTRAVENOUS | Status: DC | PRN

## 2020-01-12 MED ORDER — OXYTOCIN 40 UNITS IN NORMAL SALINE INFUSION - SIMPLE MED
2.5000 [IU]/h | INTRAVENOUS | Status: DC
  Filled 2020-01-12: qty 1000

## 2020-01-12 MED ORDER — OXYCODONE HCL 5 MG PO TABS
10.0000 mg | ORAL_TABLET | ORAL | Status: DC | PRN
  Administered 2020-01-13: 10 mg via ORAL
  Filled 2020-01-12: qty 2

## 2020-01-12 MED ORDER — PROMETHAZINE HCL 25 MG/ML IJ SOLN
12.5000 mg | Freq: Once | INTRAMUSCULAR | Status: AC
  Administered 2020-01-12: 12.5 mg via INTRAVENOUS
  Filled 2020-01-12: qty 1

## 2020-01-12 MED ORDER — LACTATED RINGERS IV SOLN
500.0000 mL | Freq: Once | INTRAVENOUS | Status: AC
  Administered 2020-01-12: 06:00:00 500 mL via INTRAVENOUS

## 2020-01-12 MED ORDER — DIPHENHYDRAMINE HCL 25 MG PO CAPS: 25.0000 mg | ORAL_CAPSULE | Freq: Four times a day (QID) | ORAL | Status: DC | PRN

## 2020-01-12 MED ORDER — SOD CITRATE-CITRIC ACID 500-334 MG/5ML PO SOLN: 30.0000 mL | ORAL | Status: DC | PRN

## 2020-01-12 MED ORDER — OXYTOCIN BOLUS FROM INFUSION
500.0000 mL | Freq: Once | INTRAVENOUS | Status: AC
  Administered 2020-01-12: 500 mL via INTRAVENOUS

## 2020-01-12 MED ORDER — SIMETHICONE 80 MG PO CHEW: 80.0000 mg | CHEWABLE_TABLET | ORAL | Status: DC | PRN

## 2020-01-12 MED ORDER — CEFAZOLIN SODIUM-DEXTROSE 2-4 GM/100ML-% IV SOLN
2.0000 g | Freq: Once | INTRAVENOUS | Status: AC
  Administered 2020-01-12: 2 g via INTRAVENOUS
  Filled 2020-01-12: qty 100

## 2020-01-12 MED ORDER — LACTATED RINGERS IV SOLN: INTRAVENOUS | Status: DC

## 2020-01-12 MED ORDER — ONDANSETRON HCL 4 MG/2ML IJ SOLN: 4.0000 mg | INTRAMUSCULAR | Status: DC | PRN

## 2020-01-12 MED ORDER — ZOLPIDEM TARTRATE 5 MG PO TABS: 5.0000 mg | ORAL_TABLET | Freq: Every evening | ORAL | Status: DC | PRN

## 2020-01-12 MED ORDER — TETANUS-DIPHTH-ACELL PERTUSSIS 5-2.5-18.5 LF-MCG/0.5 IM SUSP: 0.5000 mL | Freq: Once | INTRAMUSCULAR | Status: DC

## 2020-01-12 MED ORDER — COCONUT OIL OIL: 1.0000 "application " | TOPICAL_OIL | Status: DC | PRN

## 2020-01-12 MED ORDER — OXYCODONE HCL 5 MG PO TABS
5.0000 mg | ORAL_TABLET | ORAL | Status: DC | PRN
  Administered 2020-01-12: 5 mg via ORAL
  Filled 2020-01-12: qty 1

## 2020-01-12 MED ORDER — ONDANSETRON HCL 4 MG PO TABS: 4.0000 mg | ORAL_TABLET | ORAL | Status: DC | PRN

## 2020-01-12 MED ORDER — TERBUTALINE SULFATE 1 MG/ML IJ SOLN: 0.2500 mg | Freq: Once | INTRAMUSCULAR | Status: DC | PRN

## 2020-01-12 MED ORDER — VALACYCLOVIR HCL 500 MG PO TABS
500.0000 mg | ORAL_TABLET | Freq: Two times a day (BID) | ORAL | Status: DC
  Administered 2020-01-12: 03:00:00 500 mg via ORAL
  Filled 2020-01-12 (×3): qty 1

## 2020-01-12 MED ORDER — LACTATED RINGERS IV SOLN: 500.0000 mL | INTRAVENOUS | Status: DC | PRN

## 2020-01-12 MED ORDER — FENTANYL-BUPIVACAINE-NACL 0.5-0.125-0.9 MG/250ML-% EP SOLN
12.0000 mL/h | EPIDURAL | Status: DC | PRN
  Filled 2020-01-12: qty 250

## 2020-01-12 MED ORDER — DIBUCAINE (PERIANAL) 1 % EX OINT: 1.0000 "application " | TOPICAL_OINTMENT | CUTANEOUS | Status: DC | PRN

## 2020-01-12 MED ORDER — HYDROMORPHONE HCL 1 MG/ML IJ SOLN
0.5000 mg | Freq: Once | INTRAMUSCULAR | Status: AC
  Administered 2020-01-12: 0.5 mg via INTRAMUSCULAR
  Filled 2020-01-12: qty 1

## 2020-01-12 MED ORDER — SENNOSIDES-DOCUSATE SODIUM 8.6-50 MG PO TABS
2.0000 | ORAL_TABLET | ORAL | Status: DC
  Administered 2020-01-12: 23:00:00 2 via ORAL
  Filled 2020-01-12: qty 2

## 2020-01-12 MED ORDER — WITCH HAZEL-GLYCERIN EX PADS: 1.0000 "application " | MEDICATED_PAD | CUTANEOUS | Status: DC | PRN

## 2020-01-12 MED ORDER — ONDANSETRON HCL 4 MG/2ML IJ SOLN: 4.0000 mg | Freq: Four times a day (QID) | INTRAMUSCULAR | Status: DC | PRN

## 2020-01-12 MED ORDER — BUTORPHANOL TARTRATE 1 MG/ML IJ SOLN
1.0000 mg | INTRAMUSCULAR | Status: DC | PRN
  Administered 2020-01-12: 1 mg via INTRAVENOUS
  Filled 2020-01-12: qty 1

## 2020-01-12 MED ORDER — LIDOCAINE HCL (PF) 1 % IJ SOLN
INTRAMUSCULAR | Status: DC | PRN
  Administered 2020-01-12: 2 mL via EPIDURAL
  Administered 2020-01-12: 10 mL via EPIDURAL

## 2020-01-12 MED ORDER — SODIUM CHLORIDE (PF) 0.9 % IJ SOLN
INTRAMUSCULAR | Status: DC | PRN
  Administered 2020-01-12: 12 mL/h via EPIDURAL

## 2020-01-12 NOTE — Procedures (Addendum)
  Post-Placental IUD Insertion Procedure Note  Patient identified, informed consent signed prior to delivery, signed copy in chart, time out was performed.    Vaginal, labial and perineal areas thoroughly inspected for lacerations. Bilateral periurethral lacerations identified - hemostatic, not repaired prior to insertion of IUD. Paragard  - IUD grasped with sterile ring forceps. Fundus identified through abdominal wall using non-insertion hand. IUD inserted to fundus with ring forceps. IUD carefully released at the fundus and ring forceps gently removed from vagina.    Strings trimmed to the level of the introitus. Patient tolerated procedure well.  Lot # [id number] Expiration Date: 10/11/25  Patient given post procedure instructions and IUD care card with expiration date.  Patient is asked to keep IUD strings tucked in her vagina until her postpartum follow up visit in 4-6 weeks. Patient advised to abstain from sexual intercourse and pulling on strings before her follow-up visit. Patient verbalized an understanding of the plan of care and agrees.   Merilyn Baba, DO OB Fellow, [facility] 01/12/2020 8:12 AM

## 2020-01-12 NOTE — H&P (Signed)
OBSTETRIC ADMISSION HISTORY AND PHYSICAL  Angela Branch is a 24 y.o. female [phone] with IUP at [redacted]w[redacted]d presenting for latent labor with painful contractions. She reports +FMs. No LOF, VB, blurry vision, headaches, peripheral edema, or RUQ pain. She plans on breast and bottle feeding. She requests post placental IUD for birth control.  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 Adjustment disorder with mixed anxiety and depressed mood 04/29/2016  . Anemia   . Anxiety   . Asthma   . Asthma 06/12/2012  . 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 Eczema 10/04/2014  . Faintness 10/05/2015  . Family dynamics problem 03/25/2013  . Fetal tachycardia affecting management of mother 09/21/2018  . GBS (group B Streptococcus carrier), +RV culture, currently pregnant 11/14/2015  . GERD (gastroesophageal reflux disease) 09/21/2015  . H/O scabies 11/14/2015   Treated with permetherin   . 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 Loss of weight 03/25/2013  . Low back pain 09/20/2013  . Menorrhagia 09/28/2013  . Migraine headache 02/23/2014  . Sleep apnea 04/14/2014  . Substance abuse ([place]) 03/24/2015   + cannabis   . Supervision of low-risk pregnancy 06/28/2019    Nursing Staff Provider Office Location  [facility] Dating   early u/s Language  English Anatomy US   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  Flu Vaccine   Genetic Screen  NIPS: low risk female      TDaP vaccine    Hgb A1C or  GTT  Third trimester  Rhogam  n/a   LAB RESULTS  Feeding Plan Breast/Bottle Blood  Type A/Positive/-- (07/16 1206)  Contraception undecided Antibody Negative (07/16  . Thyroid disease     Past Surgical History: Past Surgical History:  Procedure Laterality Date  . TOOTH EXTRACTION      Obstetrical History: OB History    Gravida  4   Para  2   Term  2   Preterm  0   AB  1   Living  2     SAB  1   TAB  0   Ectopic  0   Multiple  0   Live Births  2           Social History: Social History   Socioeconomic History  . Marital status: Single    Spouse name: Not on file  . Number of children: Not on file  . Years of education: 9th  . Highest education level: Not on file  Occupational History    Employer: UNEMPLOYED  Tobacco Use  . Smoking status: Never Smoker  . Smokeless tobacco: Never Used  Substance and Sexual Activity  . Alcohol use: No    Alcohol/week: 0.0 standard drinks  . Drug use: No  . Sexual activity: Yes    Birth control/protection: None    Comment: last sex Sep 06 2018  Other Topics Concern  . Not on file  Social History Narrative  . Not on file   Social Determinants of Health   Financial Resource Strain:   . Difficulty of Paying Living Expenses:  Not on file  Food Insecurity: No Food Insecurity  . Worried About Charity fundraiser in the Last Year: Never true  . Ran Out of Food in the Last Year: Never true  Transportation Needs: No Transportation Needs  . Lack of Transportation (Medical): No  . Lack of Transportation (Non-Medical): No  Physical Activity:   . Days of Exercise per Week: Not on file  . Minutes of Exercise per Session: Not on file  Stress:   . Feeling of Stress : Not on file  Social Connections:   . Frequency of Communication with Friends and Family: Not on file  . Frequency of Social Gatherings with Friends and Family: Not on file  . Attends Religious Services: Not on file  . Active Member of Clubs or Organizations: Not on file  . Attends Archivist Meetings: Not on file  . Marital  Status: Not on file    Family History: Family History  Problem Relation Age of Onset  . Asthma Father   . Diabetes Maternal Grandmother   . Hyperlipidemia Paternal Grandfather   . Heart disease Paternal Grandfather   . Cancer - Colon Maternal Grandfather   . Mental illness Neg Hx   . Birth defects Neg Hx   . Kidney disease Neg Hx   . Hypertension Neg Hx     Allergies: Allergies  Allergen Reactions  . Azithromycin Diarrhea    Diarrhea, nausea/vomiting, IV burns vein Diarrhea, nausea/vomiting, IV burns vein    Medications Prior to Admission  Medication Sig Dispense Refill Last Dose  . AMBULATORY NON FORMULARY MEDICATION 1 Device by Other route as needed. BP cuff- Regular size Monitored regularly at home ICD 10: Z34.90 (Patient not taking: Reported on 11/03/2019) 1 Device 0   . cyclobenzaprine (FLEXERIL) 10 MG tablet Take 1 tablet (10 mg total) by mouth 3 (three) times daily as needed. 30 tablet 0   . Elastic Bandages & Supports (COMFORT FIT MATERNITY SUPP SM) MISC 1 Units by Does not apply route daily as needed. 1 each 0   . lidocaine (XYLOCAINE) 2 % jelly Apply 1 application topically as needed. 30 mL 0   . valACYclovir (VALTREX) 1000 MG tablet Take 1 tablet (1,000 mg total) by mouth daily. (Patient not taking: Reported on 12/03/2019) 30 tablet 1      Review of Systems:  All systems reviewed and negative except as stated in HPI  PE: Blood pressure (!) 110/53, pulse 82, temperature 98.4 F (36.9 C), temperature source Oral, resp. rate 18, height $RemoveBe'5\' 3"'UWuWXWQDU$  (1.6 m), weight 69.1 kg, last menstrual period 04/12/2019, SpO2 100 %, unknown if currently breastfeeding. General appearance: alert and cooperative, uncomfortable with contractions Lungs: regular rate and effort Heart: regular rate  Abdomen: soft, non-tender Extremities: Homans sign is negative, no sign of DVT Presentation: cephalic EFM: 620 bpm, moderate variability, 15x15 accels, no decels Toco: CTX q2-3  minutes Dilation: 4(per erin, NP- pt does not want to wait till the hour mark and wants to be checked now instead.) Effacement (%): 70 Station: Ballotable Exam by:: Alycia Rossetti RN  Prenatal labs: ABO, Rh: A/Positive/-- (07/16 1206) Antibody: Negative (07/16 1206) Rubella: 1.41 (07/16 1206) RPR: Non Reactive (11/11 0845)  HBsAg: Negative (07/16 1206)  HIV: Non Reactive (11/11 0845)  GBS:   unknown 2 hr GTT normal  Prenatal Transfer Tool  Maternal Diabetes: No Genetic Screening: Normal Maternal Ultrasounds/Referrals: Normal Fetal Ultrasounds or other Referrals:  Referred to Materal Fetal Medicine  Maternal Substance Abuse:  No  Significant Maternal Medications:  None Significant Maternal Lab Results: GBS unknown  No results found for this or any previous visit (from the past 24 hour(s)).  Patient Active Problem List   Diagnosis Date Noted  . 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 Supervision of low-risk pregnancy 06/28/2019  . Fetal tachycardia affecting management of mother 09/21/2018  . 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 Thyroid disease 03/12/2018  . Adjustment disorder with mixed anxiety and depressed mood 04/29/2016  . GERD (gastroesophageal reflux disease) 09/21/2015  . Eczema 10/04/2014  . Asthma 06/12/2012    Assessment: Angela Branch is a 24 y.o. [id number] at [redacted]w[redacted]d here for latent labor  1. Labor: expectant management. Consider augmentation as appropriate. 2. FWB: Cat I, EFW 3500g 3. Pain: per patient request 4. GBS: unknown. PCR pending. 5. COVID19: test pending, asymptomatic 6. 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 No lesions on exam. Will give Valtrex.   Plan: Admit to Labor and Delivery. Anticipate NSVD.  Meggan Dhaliwal L Georgenia Salim, DO  01/12/2020, 1:23 AM

## 2020-01-12 NOTE — Progress Notes (Signed)
CSW received consult for [facility]. After further chart review. CSW aware that MOB had adequate PNC with more than 3 visits. While reviewing chart, CSW observed that MOB reported THC use  (note from 05/2019 MAU visit) during this pregnancy.   Referral was screened out due to the following: ~MOB had no documented substance use after initial prenatal visit/+UPT. ~MOB had no positive drug screens after initial prenatal visit/+UPT. ~Baby's UDS is negative.  Please consult CSW if current concerns arise or by MOB's request.  CSW will monitor CDS results and make report to Child Protective Services if warranted.     MOB was referred for history of depression/anxiety. * Referral screened out by Clinical Social Worker because none of the following criteria appear to apply: ~ History of anxiety/depression during this pregnancy, or of post-partum depression following prior delivery. ~ Diagnosis of anxiety and/or depression within last 3 years. Per further chart review, MOB appears to have been diagnosed with depression and Adjustment Disorder with Anxiety features in 2017.  OR * MOB's symptoms currently being treated with medication and/or therapy.    Please contact the Clinical Social Worker if needs arise, by [facility] request, or if MOB scores greater than 9/yes to question 10 on Edinburgh Postpartum Depression Screen.     Virgie Dad Marvene Strohm, MSW, LCSW [facility] [place] [facility] [place] [phone]

## 2020-01-12 NOTE — Anesthesia Procedure Notes (Signed)
Epidural Patient location during procedure: OB Start time: 01/12/2020 5:50 AM End time: 01/12/2020 6:05 AM  Staffing Anesthesiologist: Pervis Hocking, DO Performed: anesthesiologist   Preanesthetic Checklist Completed: patient identified, IV checked, risks and benefits discussed, monitors and equipment checked, pre-op evaluation and timeout performed  Epidural Patient position: sitting Prep: DuraPrep and site prepped and draped Patient monitoring: continuous pulse ox, blood pressure, heart rate and cardiac monitor Approach: midline Location: L3-L4 Injection technique: LOR air  Needle:  Needle type: Tuohy  Needle gauge: 17 G Needle length: 9 cm Needle insertion depth: 6 cm Catheter type: closed end flexible Catheter size: 19 Gauge Catheter at skin depth: 11 cm Test dose: negative  Assessment Sensory level: T8 Events: blood not aspirated, injection not painful, no injection resistance, no paresthesia and negative IV test  Additional Notes Patient identified. Risks/Benefits/Options discussed with patient including but not limited to bleeding, infection, nerve damage, paralysis, failed block, incomplete pain control, headache, blood pressure changes, nausea, vomiting, reactions to medication both or allergic, itching and postpartum back pain. Confirmed with bedside nurse the patient's most recent platelet count. Confirmed with patient that they are not currently taking any anticoagulation, have any bleeding history or any family history of bleeding disorders. Patient expressed understanding and wished to proceed. All questions were answered. Sterile technique was used throughout the entire procedure. Please see nursing notes for vital signs. Test dose was given through epidural catheter and negative prior to continuing to dose epidural or start infusion. Warning signs of high block given to the patient including shortness of breath, tingling/numbness in hands, complete motor  block, or any concerning symptoms with instructions to call for help. Patient was given instructions on fall risk and not to get out of bed. All questions and concerns addressed with instructions to call with any issues or inadequate analgesia.  Reason for block:procedure for pain

## 2020-01-12 NOTE — Anesthesia Postprocedure Evaluation (Signed)
Anesthesia Post Note  Patient: Angela Branch  Procedure(s) Performed: AN AD HOC LABOR EPIDURAL     Patient location during evaluation: Mother Baby Anesthesia Type: Epidural Level of consciousness: awake and alert and oriented Pain management: satisfactory to patient Vital Signs Assessment: post-procedure vital signs reviewed and stable Respiratory status: spontaneous breathing and nonlabored ventilation Cardiovascular status: stable Postop Assessment: no headache, no backache, no signs of nausea or vomiting, adequate PO intake, patient able to bend at knees and able to ambulate (patient up walking) Anesthetic complications: no    Last Vitals:  Vitals:   01/12/20 1030 01/12/20 1359  BP: (!) 98/59 (!) 111/7  Pulse: 71 71  Resp: 18 18  Temp: 36.6 C 36.6 C  SpO2:      Last Pain:  Vitals:   01/12/20 1359  TempSrc: Axillary  PainSc:    Pain Goal: Patients Stated Pain Goal: 0 (01/11/20 2202)                 Willa Rough

## 2020-01-12 NOTE — Lactation Note (Signed)
This note was copied from a baby's chart. Lactation Consultation Note:  P3, Mother reports that she breastfed her second child for 6 months. Infant is 3 hours old. LC arrived in mothers room and mother was breastfeeding infant in side lying position. Mother reports that infant is feeding well. Observed good deep latch with strong suckling and swallows.   Mother reports that she is seeing colostrum when hand expresses.  Mother advised to continue to cue base feed infant, breastfeed infant 8-12 or more times in 24 hours. Discussed frequent STS and cluster feeding.  Mother was given [facility] brochure with information on all [facility] services and in the community.  .  Mother    Patient Name: Angela Branch Date: 01/12/2020     Maternal Data    Feeding Feeding Type: Breast Fed  LATCH Score                   Interventions    Lactation Tools Discussed/Used     Consult Status      Darla Lesches 01/12/2020, 2:44 PM

## 2020-01-12 NOTE — Anesthesia Preprocedure Evaluation (Signed)
Anesthesia Evaluation  Patient identified by MRN, date of birth, ID band Patient awake    Reviewed: Allergy & Precautions, H&P , NPO status , Patient's Chart, lab work & pertinent test results  Airway Mallampati: II  TM Distance: >3 FB Neck ROM: full    Dental no notable dental hx.    Pulmonary asthma , sleep apnea ,    Pulmonary exam normal breath sounds clear to auscultation       Cardiovascular negative cardio ROS   Rhythm:regular Rate:Normal     Neuro/Psych  Headaches, PSYCHIATRIC DISORDERS Anxiety Adjustment disorder, anxiety, depression   GI/Hepatic GERD  Medicated and Controlled,(+)     substance abuse  marijuana use,   Endo/Other  negative endocrine ROS  Renal/GU negative Renal ROS     Musculoskeletal negative musculoskeletal ROS (+)   Abdominal   Peds  Hematology  (+) anemia , plt 169   Anesthesia Other Findings   Reproductive/Obstetrics (+) Pregnancy                             Anesthesia Physical  Anesthesia Plan  ASA: II and emergent  Anesthesia Plan: Epidural   Post-op Pain Management:    Induction:   PONV Risk Score and Plan: 2  Airway Management Planned: Natural Airway  Additional Equipment: None  Intra-op Plan:   Post-operative Plan:   Informed Consent: I have reviewed the patients History and Physical, chart, labs and discussed the procedure including the risks, benefits and alternatives for the proposed anesthesia with the patient or authorized representative who has indicated his/her understanding and acceptance.       Plan Discussed with:   Anesthesia Plan Comments:         Anesthesia Quick Evaluation

## 2020-01-12 NOTE — Discharge Summary (Signed)
Postpartum Discharge Summary      Patient Name: Angela Branch DOB: [birth year] MRN: [id number]  Date of admission: 01/11/2020 Delivering Provider: Merilyn Baba   Date of discharge: 01/13/2020  Admitting diagnosis: Normal labor [O80, Z37.9] Intrauterine pregnancy: [redacted]w[redacted]d    Secondary diagnosis:  Active Problems:   Normal labor  Additional problems: Thyroid disease, HSV2     Discharge diagnosis: Term Pregnancy Delivered                                                                                                Post partum procedures: post placental IUD  Augmentation: AROM and Pitocin  Complications: cord avulsion with manual removal of the placenta  Hospital course:  Onset of Labor With Vaginal Delivery     24y.o. yo [id number] admitted in Latent Labor on 01/11/2020. Patient had an uncomplicated labor course as follows:   Patient was admitted at 4 cm. She was augmented with pitocin and received an epidural. She progressed to 8 cm, was AROMed, and delivered shortly after.  Membrane Rupture Time/Date: 7:13 AM ,01/12/2020   Intrapartum Procedures: Episiotomy: None [1]                                         Lacerations:  None [1];Periurethral [8]  Patient had a delivery of a Viable infant. 01/12/2020  Information for the patient's newborn:  Je[place], [place]0[[id number]]Delivery Method: Vaginal, Spontaneous(Filed from Delivery Summary)     Pateint had a retained placenta that was manually removed. She received 2 g of Ancef after. She had an otherwise uncomplicated postpartum course.  She is ambulating, tolerating a regular diet, passing flatus, and urinating well. Patient is discharged home in stable condition on 01/13/20.  Delivery time: 7:26 AM    Magnesium Sulfate received: No BMZ received: No Rhophylac:N/A MMR:N/A Transfusion:No  Physical exam  Vitals:   01/12/20 1359 01/12/20 1810 01/12/20 2330 01/13/20 0518  BP: 111/71 104/63 106/62 (!)  90/55  Pulse: 71 68 67 67  Resp: 18 18 16 16   Temp: 97.9 F (36.6 C) 98 F (36.7 C) 98 F (36.7 C) 97.6 F (36.4 C)  TempSrc: Axillary Oral Oral Oral  SpO2:  99%    Weight:      Height:       General: alert, cooperative and no distress Lochia: appropriate Uterine Fundus: firm Incision: N/A DVT Evaluation: No evidence of DVT seen on physical exam. Labs: Lab Results  Component Value Date   WBC 12.9 (H) 01/13/2020   HGB 11.7 (L) 01/13/2020   HCT 36.1 01/13/2020   MCV 85.7 01/13/2020   PLT 167 01/13/2020   CMP Latest Ref Rng & Units 05/15/2019  Glucose 70 - 99 mg/dL 91  BUN 6 - 20 mg/dL 11  Creatinine 0.44 - 1.00 mg/dL 0.88  Sodium 135 - 145 mmol/L 134(L)  Potassium 3.5 - 5.1 mmol/L 3.8  Chloride 98 - 111 mmol/L 102  CO2 22 - 32 mmol/L  22  Calcium 8.9 - 10.3 mg/dL 9.3  Total Protein 6.5 - 8.1 g/dL 7.6  Total Bilirubin 0.3 - 1.2 mg/dL 3.3(H)  Alkaline Phos 38 - 126 U/L 39  AST 15 - 41 U/L 18  ALT 0 - 44 U/L 15    Discharge instruction: per After Visit Summary and "Baby and Me Booklet".  After visit meds:  Allergies as of 01/13/2020      Reactions   Azithromycin Diarrhea   Diarrhea, nausea/vomiting, IV burns vein Diarrhea, nausea/vomiting, IV burns vein      Medication List    STOP taking these medications   valACYclovir 1000 MG tablet Commonly known as: Valtrex     TAKE these medications   acetaminophen 325 MG tablet Commonly known as: Tylenol Take 2 tablets (650 mg total) by mouth every 4 (four) hours as needed (for pain scale < 4).   AMBULATORY NON FORMULARY MEDICATION 1 Device by Other route as needed. BP cuff- Regular size Monitored regularly at home ICD 10: Z34.90   [place] 1 Units by Does not apply route daily as needed.   cyclobenzaprine 10 MG tablet Commonly known as: FLEXERIL Take 1 tablet (10 mg total) by mouth 3 (three) times daily as needed.   ibuprofen 600 MG tablet Commonly known as: ADVIL Take 1 tablet  (600 mg total) by mouth every 6 (six) hours.   lidocaine 2 % jelly Commonly known as: XYLOCAINE Apply 1 application topically as needed.   oxyCODONE 5 MG immediate release tablet Commonly known as: Oxy IR/ROXICODONE Take 1 tablet (5 mg total) by mouth every 4 (four) hours as needed (pain scale 4-7).       Diet: routine diet  Activity: Advance as tolerated. Pelvic rest for 6 weeks.   Outpatient follow up:4 weeks Follow up Appt: No future appointments. Follow up Visit: Please schedule this patient for Postpartum visit in: 4 weeks with the following provider: MD For C/S patients schedule nurse incision check in weeks 2 weeks: no Low risk pregnancy complicated by: thyroid disease Delivery mode:  SVD Anticipated Birth Control:  IUD (paragard placed post placental) PP Procedures needed: IUD string check (in person post partum visit)  Schedule Integrated BH visit: no  Newborn Data: Live born female  Birth Weight:  3260 g APGAR: 42, 9  Newborn Delivery   Birth date/time: 01/12/2020 07:26:00 Delivery type: Vaginal, Spontaneous      Baby Feeding: Breast Disposition:home with mother   01/13/2020 Merilyn Baba, DO

## 2020-01-12 NOTE — Progress Notes (Signed)
Angela Branch is a 24 y.o. [phone] at [redacted]w[redacted]d admitted for latent labor  Subjective: Contractions still painful, stadol and phenergan did take the edge off  Objective: BP (!) 91/54   Pulse 87   Temp 97.9 F (36.6 C) (Oral)   Resp 16   Ht $R'5\' 3"'ax$  (1.6 m)   Wt 69.1 kg   LMP 04/12/2019   SpO2 100%   BMI 27.00 kg/m  No intake/output data recorded.  FHT:  FHR: 125 bpm, variability: moderate,  accelerations:  Present,  decelerations:  Absent UC:   regular, every 2-3 minutes  SVE:   Dilation: 4 Effacement (%): 90 Station: -2 Exam by:: B [employer] RN   Labs: Lab Results  Component Value Date   WBC 13.2 (H) 01/12/2020   HGB 12.3 01/12/2020   HCT 37.9 01/12/2020   MCV 86.9 01/12/2020   PLT 169 01/12/2020    Assessment / Plan: Angela Branch is a 24 y.o. [id number] at [redacted]w[redacted]d here for latent labor  1. Labor: no cervical change despite regular, painful contractions. Will augment with pitocin. 2. FWB: Cat I, EFW 3500g 3. Pain: per patient request 4. GBS: negative by PCR 5. COVID19: negative 6. 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 No lesions on exam. On Valtrex inpatient.  Merilyn Baba DO OB Fellow, Faculty Practice 01/12/2020, 5:29 AM

## 2020-01-12 NOTE — Discharge Instructions (Signed)
Postpartum Care After Vaginal Delivery This sheet gives you information about how to care for yourself from the time you deliver your baby to up to 6-12 weeks after delivery (postpartum period). Your health care provider may also give you more specific instructions. If you have problems or questions, contact your health care provider. Follow these instructions at home: Vaginal bleeding  It is normal to have vaginal bleeding (lochia) after delivery. Wear a sanitary pad for vaginal bleeding and discharge. ? During the first week after delivery, the amount and appearance of lochia is often similar to a menstrual period. ? Over the next few weeks, it will gradually decrease to a dry, yellow-brown discharge. ? For most women, lochia stops completely by 4-6 weeks after delivery. Vaginal bleeding can vary from woman to woman.  Change your sanitary pads frequently. Watch for any changes in your flow, such as: ? A sudden increase in volume. ? A change in color. ? Large blood clots.  If you pass a blood clot from your vagina, save it and call your health care provider to discuss. Do not flush blood clots down the toilet before talking with your health care provider.  Do not use tampons or douches until your health care provider says this is safe.  If you are not breastfeeding, your period should return 6-8 weeks after delivery. If you are feeding your child breast milk only (exclusive breastfeeding), your period may not return until you stop breastfeeding. Perineal care  Keep the area between the vagina and the anus (perineum) clean and dry as told by your health care provider. Use medicated pads and pain-relieving sprays and creams as directed.  If you had a cut in the perineum (episiotomy) or a tear in the vagina, check the area for signs of infection until you are healed. Check for: ? More redness, swelling, or pain. ? Fluid or blood coming from the cut or tear. ? Warmth. ? Pus or a bad  smell.  You may be given a squirt bottle to use instead of wiping to clean the perineum area after you go to the bathroom. As you start healing, you may use the squirt bottle before wiping yourself. Make sure to wipe gently.  To relieve pain caused by an episiotomy, a tear in the vagina, or swollen veins in the anus (hemorrhoids), try taking a warm sitz bath 2-3 times a day. A sitz bath is a warm water bath that is taken while you are sitting down. The water should only come up to your hips and should cover your buttocks. Breast care  Within the first few days after delivery, your breasts may feel heavy, full, and uncomfortable (breast engorgement). Milk may also leak from your breasts. Your health care provider can suggest ways to help relieve the discomfort. Breast engorgement should go away within a few days.  If you are breastfeeding: ? Wear a bra that supports your breasts and fits you well. ? Keep your nipples clean and dry. Apply creams and ointments as told by your health care provider. ? You may need to use breast pads to absorb milk that leaks from your breasts. ? You may have uterine contractions every time you breastfeed for up to several weeks after delivery. Uterine contractions help your uterus return to its normal size. ? If you have any problems with breastfeeding, work with your health care provider or Science writer.  If you are not breastfeeding: ? Avoid touching your breasts a lot. Doing this can make  your breasts produce more milk. ? Wear a good-fitting bra and use cold packs to help with swelling. ? Do not squeeze out (express) milk. This causes you to make more milk. Intimacy and sexuality  Ask your health care provider when you can engage in sexual activity. This may depend on: ? Your risk of infection. ? How fast you are healing. ? Your comfort and desire to engage in sexual activity.  You are able to get pregnant after delivery, even if you have not had  your period. If desired, talk with your health care provider about methods of birth control (contraception). Medicines  Take over-the-counter and prescription medicines only as told by your health care provider.  If you were prescribed an antibiotic medicine, take it as told by your health care provider. Do not stop taking the antibiotic even if you start to feel better. Activity  Gradually return to your normal activities as told by your health care provider. Ask your health care provider what activities are safe for you.  Rest as much as possible. Try to rest or take a nap while your baby is sleeping. Eating and drinking   Drink enough fluid to keep your urine pale yellow.  Eat high-fiber foods every day. These may help prevent or relieve constipation. High-fiber foods include: ? Whole grain cereals and breads. ? Brown rice. ? Beans. ? Fresh fruits and vegetables.  Do not try to lose weight quickly by cutting back on calories.  Take your prenatal vitamins until your postpartum checkup or until your health care provider tells you it is okay to stop. Lifestyle  Do not use any products that contain nicotine or tobacco, such as cigarettes and e-cigarettes. If you need help quitting, ask your health care provider.  Do not drink alcohol, especially if you are breastfeeding. General instructions  Keep all follow-up visits for you and your baby as told by your health care provider. Most women visit their health care provider for a postpartum checkup within the first 3-6 weeks after delivery. Contact a health care provider if:  You feel unable to cope with the changes that your child brings to your life, and these feelings do not go away.  You feel unusually sad or worried.  Your breasts become red, painful, or hard.  You have a fever.  You have trouble holding urine or keeping urine from leaking.  You have little or no interest in activities you used to enjoy.  You have not  breastfed at all and you have not had a menstrual period for 12 weeks after delivery.  You have stopped breastfeeding and you have not had a menstrual period for 12 weeks after you stopped breastfeeding.  You have questions about caring for yourself or your baby.  You pass a blood clot from your vagina. Get help right away if:  You have chest pain.  You have difficulty breathing.  You have sudden, severe leg pain.  You have severe pain or cramping in your lower abdomen.  You bleed from your vagina so much that you fill more than one sanitary pad in one hour. Bleeding should not be heavier than your heaviest period.  You develop a severe headache.  You faint.  You have blurred vision or spots in your vision.  You have bad-smelling vaginal discharge.  You have thoughts about hurting yourself or your baby. If you ever feel like you may hurt yourself or others, or have thoughts about taking your own life, get help  right away. You can go to the nearest emergency department or call:  Your local emergency services (911 in the [place]  A suicide crisis helpline, such as the [facility] at 509 879 5975. This is open 24 hours a day. Summary  The period of time right after you deliver your newborn up to 6-12 weeks after delivery is called the postpartum period.  Gradually return to your normal activities as told by your health care provider.  Keep all follow-up visits for you and your baby as told by your health care provider. This information is not intended to replace advice given to you by your health care provider. Make sure you discuss any questions you have with your health care provider. Document Revised: 12/12/2017 Document Reviewed: 09/22/2017 Elsevier Patient Education  [employer].   Intrauterine Device Information An intrauterine device (IUD) is a medical device that is inserted in the uterus to prevent pregnancy. It is a small, T-shaped  device that has one or two nylon strings hanging down from it. The strings hang out of the lower part of the uterus (cervix) to allow for future IUD removal. There are two types of IUDs available:  Hormone IUD. This type of IUD is made of plastic and contains the hormone progestin (synthetic progesterone). A hormone IUD may last 3-5 years.  Copper IUD. This type of IUD has copper wire wrapped around it. A copper IUD may last up to 10 years. How is an IUD inserted? An IUD is inserted through the vagina and placed into the uterus with a minor medical procedure. The exact procedure for IUD insertion may vary among health care providers and hospitals. How does an IUD work? Synthetic progesterone in a hormonal IUD prevents pregnancy by:  Thickening cervical mucus to prevent sperm from entering the uterus.  Thinning the uterine lining to prevent a fertilized egg from being implanted there. Copper in a copper IUD prevents pregnancy by making the uterus and fallopian tubes produce a fluid that kills sperm. What are the advantages of an IUD? Advantages of either type of IUD  It is highly effective in preventing pregnancy.  It is reversible. You can become pregnant shortly after the IUD is removed.  It is low-maintenance and can stay in place for a long time.  There are no estrogen-related side effects.  It can be used when breastfeeding.  It is not associated with weight gain.  It can be inserted right after childbirth, an abortion, or a miscarriage. Advantages of a hormone IUD  If it is inserted within 7 days of your period starting, it works right after it is inserted. If the hormone IUD is inserted at any other time in your cycle, you will need to use a backup method of birth control for 7 days after insertion.  It can make menstrual periods lighter.  It can reduce menstrual cramping.  It can be used for 3-5 years. Advantages of a copper IUD  It works right after it is  inserted.  It can be used as a form of emergency birth control if it is inserted within 5 days after having unprotected sex.  It does not interfere with your body's natural hormones.  It can be used for 10 years. What are the disadvantages of an IUD?  An IUD may cause irregular menstrual bleeding for a period of time after insertion.  You may have pain during insertion and have cramping and vaginal bleeding after insertion.  An IUD may cut the  uterus (uterine perforation) when it is inserted. This is rare.  An IUD may cause pelvic inflammatory disease (PID), which is an infection in the uterus and fallopian tubes. This is rare, and it usually happens during the first 20 days after the IUD is inserted.  A copper IUD can make your menstrual flow heavier and more painful. How is an IUD removed?  You will lie on your back with your knees bent and your feet in footrests (stirrups).  A device will be inserted into your vagina to spread apart the vaginal walls (speculum). This will allow your health care provider to see the strings attached to the IUD.  Your health care provider will use a small instrument (forceps) to grasp the IUD strings and pull firmly until the IUD is removed. You may have some discomfort when the IUD is removed. Your health care provider may recommend taking over-the-counter pain relievers, such as ibuprofen, before the procedure. You may also have minor spotting for a few days after the procedure. The exact procedure for IUD removal may vary among health care providers and hospitals. Is the IUD right for me? Your health care provider will make sure you are a good candidate for an IUD and will discuss the advantages, disadvantages, and possible side effects with you. Summary  An intrauterine device (IUD) is a medical device that is inserted in the uterus to prevent pregnancy. It is a small, T-shaped device that has one or two nylon strings hanging down from it.  A  hormone IUD contains the hormone progestin (synthetic progesterone). A copper IUD has copper wire wrapped around it.  Synthetic progesterone in a hormone IUD prevents pregnancy by thickening cervical mucus and thinning the walls of the uterus. Copper in a copper IUD prevents pregnancy by making the uterus and fallopian tubes produce a fluid that kills sperm.  A hormone IUD can be left in place for 3-5 years. A copper IUD can be left in place for up to 10 years.  An IUD is inserted and removed by a health care provider. You may feel some pain during insertion and removal. Your health care provider may recommend taking over-the-counter pain medicine, such as ibuprofen, before an IUD procedure. This information is not intended to replace advice given to you by your health care provider. Make sure you discuss any questions you have with your health care provider. Document Revised: 11/21/2017 Document Reviewed: 01/07/2017 Elsevier Patient Education  [place].

## 2020-01-13 ENCOUNTER — Encounter: Payer: Medicaid Other | Admitting: Family Medicine

## 2020-01-13 LAB — CBC
HCT: 36.1 % (ref 36.0–46.0)
Hemoglobin: 11.7 g/dL — ABNORMAL LOW (ref 12.0–15.0)
MCH: 27.8 pg (ref 26.0–34.0)
MCHC: 32.4 g/dL (ref 30.0–36.0)
MCV: 85.7 fL (ref 80.0–100.0)
Platelets: 167 10*3/uL (ref 150–400)
RBC: 4.21 MIL/uL (ref 3.87–5.11)
RDW: 14.4 % (ref 11.5–15.5)
WBC: 12.9 10*3/uL — ABNORMAL HIGH (ref 4.0–10.5)
nRBC: 0 % (ref 0.0–0.2)

## 2020-01-13 LAB — SURGICAL PATHOLOGY

## 2020-01-13 MED ORDER — OXYCODONE HCL 5 MG PO TABS: 5.0000 mg | ORAL_TABLET | ORAL | 0 refills | Status: DC | PRN

## 2020-01-13 MED ORDER — IBUPROFEN 600 MG PO TABS: 600.0000 mg | ORAL_TABLET | Freq: Four times a day (QID) | ORAL | 0 refills | Status: DC

## 2020-01-13 MED ORDER — ACETAMINOPHEN 325 MG PO TABS: 650.0000 mg | ORAL_TABLET | ORAL | 0 refills | Status: DC | PRN

## 2020-01-13 MED FILL — oxyCODONE HCL 5 MG TABS: 5 | 5 days supply | Qty: 30 | Fill #0

## 2020-01-13 MED FILL — IBUPROFEN 600 MG TABLET: 600 | 8 days supply | Qty: 30 | Fill #0

## 2020-01-13 MED FILL — ACETAMINOPHEN 325 MG TABS: 325 | 4 days supply | Qty: 30 | Fill #0

## 2020-01-13 NOTE — Progress Notes (Addendum)
Post Partum Day 1 Subjective: up ad lib, voiding and tolerating PO  Patient is tired, did not get much sleep last night. She is ready to go home. Denies CP, SOB, palpitations, fatigue.   Objective: Blood pressure (!) 90/55, pulse 67, temperature 97.6 F (36.4 C), temperature source Oral, resp. rate 16, height $RemoveBe'5\' 3"'IxpsLvSaP$  (1.6 m), weight 69.1 kg, last menstrual period 04/12/2019, SpO2 99 %, unknown if currently breastfeeding.  Physical Exam:  General: alert, no distress and flat affect Lochia: appropriate Uterine Fundus: firm Incision: n/a  DVT Evaluation: No evidence of DVT seen on physical exam. No cords or calf tenderness. No significant calf/ankle edema.  Recent Labs    01/12/20 0120 01/13/20 0531  HGB 12.3 11.7*  HCT 37.9 36.1    Assessment/Plan:  24 yo [id number] ppd#1 s/p NSVD, doing well.  Discharge home.  Breast and bottlefeeding.  Contraception: paragard IUD placed post placental   Follow up: 4 weeks for string check post partum visit    LOS: 1 day   Ellin Goodie 01/13/2020, 7:36 AM   GME ATTESTATION:  I saw and evaluated the patient. I agree with the findings and the plan of care as documented in the student's note. Please see discharge summary for details.  Merilyn Baba, DO OB Fellow, [facility] [place] 01/13/2020 9:44 AM

## 2020-01-13 NOTE — Progress Notes (Signed)
Ms. Nessel had previously asked RN about getting food and RN informed her that [facility] was open 24 hours and she could walk down to get some so long as dad was in room with baby. RN saw Ms. Delaluz leave the floor at 0145. When Ms. Dejoseph had not returned in a timely manner RN went to room and asked father where she was and he stated "she went down to get food." NT was in room giving baby a bath. NT told RN outside of room that father of baby had mentioned that Ms. Savary left to pick up food at [employer] on [address]. At [address] Ms. Millirons returned to her room. RN asked her where she got food and she stated "I did not leave, I had food delivered downstairs that I waited on." Although father of baby had disclosed otherwise RN educated mother that for future reference she should not leave premises while still a patient due to her safety concerns. RN informed mother that she would have to sign out AMA if she chose to leave and that her safety is our priority. Ms. Schreurs stated understanding and reiterated that she did not leave.

## 2020-01-13 NOTE — Lactation Note (Signed)
This note was copied from a baby's chart. Lactation Consultation Note  Patient Name: [name] [id number] Date: 01/13/2020 Reason for consult: Follow-up assessment Baby is 25 hours old/4% weight loss.  Mom reports  that baby is feeding well.  She has no history of engorgement with previous babies.  No questions or concerns.  Reviewed outpatient services and encouraged to call prn.  Maternal Data    Feeding Feeding Type: Breast Fed  LATCH Score Latch: Grasps breast easily, tongue down, lips flanged, rhythmical sucking.  Audible Swallowing: A few with stimulation  Type of Nipple: Everted at rest and after stimulation  Comfort (Breast/Nipple): Soft / non-tender  Hold (Positioning): No assistance needed to correctly position infant at breast.  LATCH Score: 9  Interventions    Lactation Tools Discussed/Used     Consult Status Consult Status: Complete Follow-up type: Call as needed    Ave Filter 01/13/2020, 10:15 AM

## 2020-01-13 NOTE — Progress Notes (Addendum)
CSW verbally consulted by RN to speak with MOB at bedside regarding further needs.   CSW entered the room and congratulated MOB on the birth of infant Dewaine Conger). CSW advised MOB of CSW's role and the reason for CSW stopping by to see her. Per MOB she is fine but is very tired and ready to go home. MOB reports frustration with staff as "they keep coming in the room. Last night everyone kept coming in and calling me the wrong name and I told them that want me". In Mob telling CSW this, MOB became very frustrated and reported that she is just really ready to get out of here and be at home. CSW understanding and apologized to [facility] for the mishaps in calling MOB other persons name. CSW also education MOB on why RN's and other staff may be in and out often. MOB reported that she understood this but also expressed feeling like "they all have been doing the same thing, my pediatrician mentioned my baby was fine and that they can see him tomorrow in the office". CSW expressed understanding once more and advised MOB to please stay a little longer. MOB expressed that if there are true concerns with infant then should is more than happy to remain here to be with infant. CSW advised MOB that if she is ready and infant isnt then that would be classified as leaving AMA and then CSW would have to make a CPS report. MOB expressed once more to CSW that she will not leave unless both are cleared to.    CSW also advised MOB of why cotton balls were placed in infants diaper. MOB reported that she did use in June and nothing since.  CSW took time to provided MOB with PPD education,. MOB was given PPD Checklist in order to track feelings as they relate to PPD. MOB reported that she has no other needs. CSW signing off at this time.      Virgie Dad Deuce Paternoster, MSW, LCSW [facility] [place] [phone]

## 2020-02-14 ENCOUNTER — Other Ambulatory Visit: Payer: Self-pay

## 2020-02-14 ENCOUNTER — Encounter: Payer: Self-pay | Admitting: Obstetrics & Gynecology

## 2020-02-14 ENCOUNTER — Other Ambulatory Visit (HOSPITAL_COMMUNITY)
Admission: RE | Admit: 2020-02-14 | Discharge: 2020-02-14 | Disposition: A | Discharge status: Home / Self Care | Payer: Medicaid Other | Source: Ambulatory Visit | Attending: Obstetrics & Gynecology | Admitting: Obstetrics & Gynecology

## 2020-02-14 ENCOUNTER — Ambulatory Visit (INDEPENDENT_AMBULATORY_CARE_PROVIDER_SITE_OTHER): Payer: Medicaid Other | Admitting: Obstetrics & Gynecology

## 2020-02-14 VITALS — BP 103/70 | HR 78 | Ht 62.5 in | Wt 129.4 lb

## 2020-02-14 DIAGNOSIS — N760 Acute vaginitis: Secondary | ICD-10-CM | POA: Diagnosis not present

## 2020-02-14 DIAGNOSIS — N898 Other specified noninflammatory disorders of vagina: Secondary | ICD-10-CM | POA: Diagnosis present

## 2020-02-14 DIAGNOSIS — O8613 Vaginitis following delivery: Secondary | ICD-10-CM | POA: Diagnosis not present

## 2020-02-14 DIAGNOSIS — Z30431 Encounter for routine checking of intrauterine contraceptive device: Secondary | ICD-10-CM

## 2020-02-14 NOTE — Patient Instructions (Signed)

## 2020-02-14 NOTE — Progress Notes (Signed)
Subjective:     Angela Branch is a 24 y.o. female who presents for a postpartum visit. She is 4 weeks postpartum following a spontaneous vaginal delivery. I have fully reviewed the prenatal and intrapartum course. The delivery was at 22 gestational weeks. Outcome: spontaneous vaginal delivery. Anesthesia: epidural. Postpartum course has been ormal. Baby's course has been good. Baby is feeding by both breast and bottle - [employer]. Bleeding thin lochia. Bowel function is normal. Bladder function is normal. Patient is sexually active. Contraception method is IUD. Postpartum depression screening: negative.  The following portions of the patient's history were reviewed and updated as appropriate: allergies, current medications, past family history, past medical history, past social history, past surgical history and problem list.  Review of Systems Genitourinary:positive for vaginal discharge   Objective:    LMP 04/12/2019   General:  alert, cooperative and no distress           Abdomen: flat   Vulva:  normal  Vagina: vagina positive for mucus diacharge  Cervix:  no lesions and strings trimmed to 3 cm  Corpus: not examined  Adnexa:  not evaluated  Rectal Exam: Not performed.        Assessment:     normal postpartum exam. Pap smear not done at today's visit.   Plan:    1. Contraception: IUD 2. Has Paragard 3. Follow up in: 1 year or as needed.     Woodroe Mode, MD

## 2020-02-16 ENCOUNTER — Other Ambulatory Visit: Payer: Self-pay | Admitting: Obstetrics & Gynecology

## 2020-02-16 DIAGNOSIS — N760 Acute vaginitis: Secondary | ICD-10-CM

## 2020-02-16 DIAGNOSIS — B9689 Other specified bacterial agents as the cause of diseases classified elsewhere: Secondary | ICD-10-CM

## 2020-02-16 LAB — CERVICOVAGINAL ANCILLARY ONLY
Bacterial Vaginitis (gardnerella): POSITIVE — AB
Candida Glabrata: NEGATIVE
Candida Vaginitis: NEGATIVE
Chlamydia: NEGATIVE
Comment: NEGATIVE
Comment: NEGATIVE
Comment: NEGATIVE
Comment: NEGATIVE
Comment: NEGATIVE
Comment: NORMAL
Neisseria Gonorrhea: NEGATIVE
Trichomonas: NEGATIVE

## 2020-02-16 MED ORDER — METRONIDAZOLE 500 MG PO TABS: 500.0000 mg | ORAL_TABLET | Freq: Two times a day (BID) | ORAL | 0 refills | Status: AC

## 2020-02-16 NOTE — Progress Notes (Signed)
Flagyl prescribed for BV

## 2020-06-22 ENCOUNTER — Other Ambulatory Visit (HOSPITAL_COMMUNITY)
Admission: RE | Admit: 2020-06-22 | Discharge: 2020-06-22 | Disposition: A | Discharge status: Home / Self Care | Payer: Medicaid Other | Source: Ambulatory Visit | Attending: Obstetrics & Gynecology | Admitting: Obstetrics & Gynecology

## 2020-06-22 ENCOUNTER — Encounter: Payer: Self-pay | Admitting: General Practice

## 2020-06-22 ENCOUNTER — Other Ambulatory Visit: Payer: Self-pay

## 2020-06-22 ENCOUNTER — Ambulatory Visit (INDEPENDENT_AMBULATORY_CARE_PROVIDER_SITE_OTHER): Payer: Medicaid Other | Admitting: Obstetrics & Gynecology

## 2020-06-22 ENCOUNTER — Encounter: Payer: Self-pay | Admitting: Obstetrics & Gynecology

## 2020-06-22 VITALS — BP 115/78 | HR 81 | Ht 63.0 in | Wt 140.0 lb

## 2020-06-22 DIAGNOSIS — Z113 Encounter for screening for infections with a predominantly sexual mode of transmission: Secondary | ICD-10-CM | POA: Diagnosis not present

## 2020-06-22 DIAGNOSIS — Z30018 Encounter for initial prescription of other contraceptives: Secondary | ICD-10-CM

## 2020-06-22 DIAGNOSIS — Z3202 Encounter for pregnancy test, result negative: Secondary | ICD-10-CM

## 2020-06-22 DIAGNOSIS — Z30432 Encounter for removal of intrauterine contraceptive device: Secondary | ICD-10-CM

## 2020-06-22 DIAGNOSIS — Z30015 Encounter for initial prescription of vaginal ring hormonal contraceptive: Secondary | ICD-10-CM | POA: Diagnosis not present

## 2020-06-22 LAB — POCT PREGNANCY, URINE: Preg Test, Ur: NEGATIVE

## 2020-06-22 MED ORDER — ETONOGESTREL-ETHINYL ESTRADIOL 0.12-0.015 MG/24HR VA RING: VAGINAL_RING | VAGINAL | 12 refills | Status: DC

## 2020-06-22 NOTE — Progress Notes (Signed)
Wants nuva ring but doesn't know how to insert it.

## 2020-06-22 NOTE — Patient Instructions (Signed)
Ethinyl Estradiol; Etonogestrel vaginal ring What is this medicine? ETHINYL ESTRADIOL; ETONOGESTREL (ETH in il es tra DYE ole; et oh noe JES trel) vaginal ring is a flexible, vaginal ring used as a contraceptive (birth control method). This medicine combines 2 types of female hormones, an estrogen and a progestin. This ring is used to prevent ovulation and pregnancy. Each ring is effective for 1 month. This medicine may be used for other purposes; ask your health care provider or pharmacist if you have questions. COMMON BRAND NAME(S): EluRyng, NuvaRing What should I tell my health care provider before I take this medicine? They need to know if you have any of these conditions:  abnormal vaginal bleeding  blood vessel disease or blood clots  breast, cervical, endometrial, ovarian, liver, or uterine cancer  diabetes  gallbladder disease  having surgery  heart disease or recent heart attack  high blood pressure  high cholesterol or triglycerides  history of irregular heartbeat or heart valve problems  kidney disease  liver disease  migraine headaches  protein C deficiency  protein S deficiency  recently had a baby, miscarriage, or abortion  stroke  systemic lupus erythematosus (SLE)  tobacco smoker  your age is more than 23 years old  an unusual or allergic reaction to estrogens, progestins, other medicines, foods, dyes, or preservatives  pregnant or trying to get pregnant  breast-feeding How should I use this medicine? Insert the ring into your vagina as directed. Follow the directions on the prescription label. The ring will remain place for 3 weeks and is then removed for a 1-week break. A new ring is inserted 1 week after the last ring was removed, on the same day of the week. Check often to make sure the ring is still in place. If the ring was out of the vagina for an unknown amount of time, you may not be protected from pregnancy. Perform a pregnancy test and  call your doctor. Do not use more often than directed. A patient package insert for the product will be given with each prescription and refill. Read this sheet carefully each time. The sheet may change frequently. Contact your pediatrician regarding the use of this medicine in children. Special care may be needed. Overdosage: If you think you have taken too much of this medicine contact a poison control center or emergency room at once. NOTE: This medicine is only for you. Do not share this medicine with others. What if I miss a dose? You will need to use the ring exactly as directed. It is very important to follow the schedule every cycle. If you do not use the ring as directed, you may not be protected from pregnancy. If the ring should slip out, is lost, or if you leave it in longer or shorter than you should, contact your health care professional for advice. What may interact with this medicine? Do not take this medicine with the following medications:  dasabuvir; ombitasvir; paritaprevir; ritonavir  ombitasvir; paritaprevir; ritonavir  vaginal lubricants or other vaginal products that are oil-based or silicone-based This medicine may also interact with the following medications:  acetaminophen  antibiotics or medicines for infections, especially rifampin, rifabutin, rifapentine, and griseofulvin, and possibly penicillins or tetracyclines  aprepitant or fosaprepitant  armodafinil  ascorbic acid (vitamin C)  barbiturate medicines, such as phenobarbital or primidone  bosentan  certain antiviral medicines for hepatitis, HIV or AIDS  certain medicines for cancer treatment  certain medicines for seizures like carbamazepine, clobazam, felbamate, lamotrigine, oxcarbazepine, phenytoin,  rufinamide, topiramate  certain medicines for treating high cholesterol  cyclosporine  dantrolene  elagolix  flibanserin  grapefruit juice  lesinurad  medicines for diabetes  medicines  to treat fungal infections, such as griseofulvin, miconazole, fluconazole, ketoconazole, itraconazole, posaconazole or voriconazole  mifepristone  mitotane  modafinil  morphine  mycophenolate  St. John's wort  tamoxifen  temazepam  theophylline or aminophylline  thyroid hormones  tizanidine  tranexamic acid  ulipristal  warfarin This list may not describe all possible interactions. Give your health care provider a list of all the medicines, herbs, non-prescription drugs, or dietary supplements you use. Also tell them if you smoke, drink alcohol, or use illegal drugs. Some items may interact with your medicine. What should I watch for while using this medicine? Visit your doctor or health care professional for regular checks on your progress. You will need a regular breast and pelvic exam and Pap smear while on this medicine. Check with your doctor or health care professional to see if you need an additional method of contraception during the first cycle that you use this ring. Female condoms (made with natural rubber latex, polyisoprene, and polyurethane) and spermicides may be used. Do not use a diaphragm, cervical cap, or a female condom, as the ring can interfere with these birth control methods and their proper placement. If you have any reason to think you are pregnant, stop using this medicine right away and contact your doctor or health care professional. If you are using this medicine for hormone related problems, it may take several cycles of use to see improvement in your condition. Smoking increases the risk of getting a blood clot or having a stroke while you are using hormonal birth control, especially if you are more than 23 years old. You are strongly advised not to smoke. Some women are prone to getting dark patches on the skin of the face (cholasma). Your risk of getting chloasma with this medicine is higher if you had chloasma during a pregnancy. Keep out of the  sun. If you cannot avoid being in the sun, wear protective clothing and use sunscreen. Do not use sun lamps or tanning beds/booths. This medicine can make your body retain fluid, making your fingers, hands, or ankles swell. Your blood pressure can go up. Contact your doctor or health care professional if you feel you are retaining fluid. If you are going to have elective surgery, you may need to stop using this medicine before the surgery. Consult your health care professional for advice. This medicine does not protect you against HIV infection (AIDS) or any other sexually transmitted diseases. What side effects may I notice from receiving this medicine? Side effects that you should report to your doctor or health care professional as soon as possible:  allergic reactions such as skin rash or itching, hives, swelling of the lips, mouth, tongue, or throat  depression  high blood pressure  migraines or severe, sudden headaches  signs and symptoms of a blood clot such as breathing problems; changes in vision; chest pain; severe, sudden headache; pain, swelling, warmth in the leg; trouble speaking; sudden numbness or weakness of the face, arm or leg  signs and symptoms of infection like fever or chills with dizziness and a sunburn-like rash, or pain or trouble passing urine  stomach pain  symptoms of vaginal infection like itching, irritation or unusual discharge  yellowing of the eyes or skin Side effects that usually do not require medical attention (report these to your doctor  or health care professional if they continue or are bothersome):  acne  breast pain, tenderness  irregular vaginal bleeding or spotting, particularly during the first month of use  mild headache  nausea  painful periods  vomiting This list may not describe all possible side effects. Call your doctor for medical advice about side effects. You may report side effects to FDA at 1-800-FDA-1088. Where should I  keep my medicine? Keep out of the reach of children. Store unopened medicine for up to 4 months at room temperature at 15 and 30 degrees C (59 and 86 degrees F). Protect from light. Do not store above 30 degrees C (86 degrees F). Throw away any unused medicine 4 months after the dispense date or the expiration date, whichever comes first. A ring may only be used for 1 cycle (1 month). After the 3-week cycle, a used ring is removed and should be placed in the re-closable foil pouch and discarded in the trash out of reach of children and pets. Do NOT flush down the toilet. NOTE: This sheet is a summary. It may not cover all possible information. If you have questions about this medicine, talk to your doctor, pharmacist, or health care provider.  2020 Elsevier/Gold Standard (2019-07-01 12:31:47)

## 2020-06-22 NOTE — Progress Notes (Signed)
Patient ID: Angela Branch, female   DOB: Sep 28, 1996, 24 y.o.   MRN: [id number]  Chief Complaint  Patient presents with  . Contraception  Remove IUD and start NuvaRing  HPI Angela Branch is a 24 y.o. female.  [id number] No LMP recorded. (Menstrual status: IUD). ParaGard is causing pain and dyspareunia and she wants to switch to NuvaRing.  HPI  Past Medical History:  Diagnosis Date  . Adjustment disorder with mixed anxiety and depressed mood 04/29/2016  . Anemia   . Anxiety   . Asthma   . Asthma 06/12/2012  . 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 Eczema 10/04/2014  . Faintness 10/05/2015  . Family dynamics problem 03/25/2013  . Fetal tachycardia affecting management of mother 09/21/2018  . GBS (group B Streptococcus carrier), +RV culture, currently pregnant 11/14/2015  . GERD (gastroesophageal reflux disease) 09/21/2015  . H/O scabies 11/14/2015   Treated with permetherin   . 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 Loss of weight 03/25/2013  . Low back pain 09/20/2013  . Menorrhagia 09/28/2013  . Migraine headache 02/23/2014  . Sleep apnea 04/14/2014  . Substance abuse ([place]) 03/24/2015   + cannabis   . Supervision of low-risk pregnancy 06/28/2019    Nursing Staff Provider Office Location  [facility] Dating   early u/s Language  English Anatomy US   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  Flu Vaccine   Genetic Screen  NIPS: low risk female      TDaP vaccine    Hgb A1C or  GTT  Third trimester  Rhogam  n/a   LAB RESULTS  Feeding Plan Breast/Bottle Blood Type A/Positive/-- (07/16 1206)  Contraception undecided Antibody Negative (07/16  . Thyroid disease     Past Surgical History:  Procedure Laterality Date  . TOOTH EXTRACTION      Family History  Problem Relation Age of Onset  . Asthma Father   . Diabetes Maternal Grandmother   . Hyperlipidemia Paternal Grandfather   . Heart disease Paternal Grandfather   . Cancer - Colon  Maternal Grandfather   . Mental illness Neg Hx   . Birth defects Neg Hx   . Kidney disease Neg Hx   . Hypertension Neg Hx     Social History Social History   Tobacco Use  . Smoking status: Never Smoker  . Smokeless tobacco: Never Used  Vaping Use  . Vaping Use: Never used  Substance Use Topics  . Alcohol use: No    Alcohol/week: 0.0 standard drinks  . Drug use: No    Allergies  Allergen Reactions  . Azithromycin Diarrhea    Diarrhea, nausea/vomiting, IV burns vein Diarrhea, nausea/vomiting, IV burns vein    Current Outpatient Medications  Medication Sig Dispense Refill  . etonogestrel-ethinyl estradiol (NUVARING) 0.12-0.015 MG/24HR vaginal ring Insert vaginally and leave in place for 3 consecutive weeks, then remove for 1 week. 1 each 12   No current facility-administered medications for this visit.    Review of Systems Review of Systems  Gastrointestinal: Negative.   Genitourinary: Positive for dyspareunia and pelvic pain. Negative for menstrual problem and vaginal discharge.    Blood pressure 115/78, pulse 81, height $RemoveBe'5\' 3"'IQAfvOjPq$  (1.6 m), weight 140 lb (63.5 kg), unknown if currently breastfeeding.  Physical Exam Physical Exam Vitals and nursing note reviewed. Exam conducted with a chaperone present.  Constitutional:      Appearance: Normal appearance.  Abdominal:     General: Abdomen  is flat.  Genitourinary:    General: Normal vulva.     Vagina: No vaginal discharge.     Comments: Cervix normal, string visible Neurological:     Mental Status: She is alert.      GYNECOLOGY OFFICE PROCEDURE NOTE   IUD Removal  Patient identified, informed consent performed, consent signed.  Patient was in the dorsal lithotomy position, normal external genitalia was noted.  A speculum was placed in the patient's vagina, normal discharge was noted, no lesions. The cervix was visualized, no lesions, no abnormal discharge.  The strings of the IUD were grasped and pulled using  ring forceps. The IUD was removed in its entirety.   Patient tolerated the procedure well.    Patient will use NuvaRing for contraception.  Routine preventative health maintenance measures emphasized.      Data Reviewed Insertion note at delivery  Assessment Encounter for prescription for nuvaring - Plan: etonogestrel-ethinyl estradiol (NUVARING) 0.12-0.015 MG/24HR vaginal ring  Screen for sexually transmitted diseases - Plan: Cervicovaginal ancillary only( [place])  Encounter for removal of intrauterine contraceptive device (IUD)    Plan Abstain for one week after starting NuvaRing    Emeterio Reeve 06/22/2020, 2:15 PM

## 2020-06-23 LAB — CERVICOVAGINAL ANCILLARY ONLY
Chlamydia: NEGATIVE
Comment: NEGATIVE
Comment: NEGATIVE
Comment: NORMAL
Neisseria Gonorrhea: NEGATIVE
Trichomonas: NEGATIVE

## 2020-07-26 DIAGNOSIS — R2231 Localized swelling, mass and lump, right upper limb: Secondary | ICD-10-CM | POA: Diagnosis not present

## 2020-08-21 DIAGNOSIS — M25531 Pain in right wrist: Secondary | ICD-10-CM | POA: Diagnosis not present

## 2020-08-21 DIAGNOSIS — M67431 Ganglion, right wrist: Secondary | ICD-10-CM | POA: Diagnosis not present

## 2020-08-23 DIAGNOSIS — E0590 Thyrotoxicosis, unspecified without thyrotoxic crisis or storm: Secondary | ICD-10-CM | POA: Diagnosis not present

## 2020-08-23 DIAGNOSIS — R1310 Dysphagia, unspecified: Secondary | ICD-10-CM | POA: Diagnosis not present

## 2020-08-25 DIAGNOSIS — Z01812 Encounter for preprocedural laboratory examination: Secondary | ICD-10-CM | POA: Diagnosis not present

## 2020-08-25 DIAGNOSIS — Z20822 Contact with and (suspected) exposure to covid-19: Secondary | ICD-10-CM | POA: Diagnosis not present

## 2020-08-29 DIAGNOSIS — Z87891 Personal history of nicotine dependence: Secondary | ICD-10-CM | POA: Diagnosis not present

## 2020-08-29 DIAGNOSIS — J45909 Unspecified asthma, uncomplicated: Secondary | ICD-10-CM | POA: Diagnosis not present

## 2020-08-29 DIAGNOSIS — M67431 Ganglion, right wrist: Secondary | ICD-10-CM | POA: Diagnosis not present

## 2020-08-29 DIAGNOSIS — F419 Anxiety disorder, unspecified: Secondary | ICD-10-CM | POA: Diagnosis not present

## 2020-08-29 DIAGNOSIS — E0590 Thyrotoxicosis, unspecified without thyrotoxic crisis or storm: Secondary | ICD-10-CM | POA: Diagnosis not present

## 2020-08-30 ENCOUNTER — Inpatient Hospital Stay (HOSPITAL_COMMUNITY)
Admission: AD | Admit: 2020-08-30 | Discharge: 2020-08-30 | Disposition: A | Discharge status: Home / Self Care | Payer: Medicaid Other | Attending: Obstetrics & Gynecology | Admitting: Obstetrics & Gynecology

## 2020-08-30 ENCOUNTER — Encounter (HOSPITAL_COMMUNITY): Payer: Self-pay | Admitting: Obstetrics & Gynecology

## 2020-08-30 ENCOUNTER — Other Ambulatory Visit: Payer: Self-pay

## 2020-08-30 DIAGNOSIS — N911 Secondary amenorrhea: Secondary | ICD-10-CM | POA: Insufficient documentation

## 2020-08-30 DIAGNOSIS — Z3202 Encounter for pregnancy test, result negative: Secondary | ICD-10-CM | POA: Diagnosis not present

## 2020-08-30 DIAGNOSIS — G4730 Sleep apnea, unspecified: Secondary | ICD-10-CM | POA: Diagnosis not present

## 2020-08-30 DIAGNOSIS — K219 Gastro-esophageal reflux disease without esophagitis: Secondary | ICD-10-CM | POA: Diagnosis not present

## 2020-08-30 DIAGNOSIS — Z79899 Other long term (current) drug therapy: Secondary | ICD-10-CM | POA: Insufficient documentation

## 2020-08-30 DIAGNOSIS — Z881 Allergy status to other antibiotic agents status: Secondary | ICD-10-CM | POA: Insufficient documentation

## 2020-08-30 DIAGNOSIS — J45909 Unspecified asthma, uncomplicated: Secondary | ICD-10-CM | POA: Insufficient documentation

## 2020-08-30 DIAGNOSIS — Z789 Other specified health status: Secondary | ICD-10-CM

## 2020-08-30 DIAGNOSIS — R109 Unspecified abdominal pain: Secondary | ICD-10-CM | POA: Insufficient documentation

## 2020-08-30 DIAGNOSIS — Z975 Presence of (intrauterine) contraceptive device: Secondary | ICD-10-CM | POA: Diagnosis not present

## 2020-08-30 HISTORY — PX: WRIST SURGERY: SHX841

## 2020-08-30 LAB — WET PREP, GENITAL
Clue Cells Wet Prep HPF POC: NONE SEEN
Sperm: NONE SEEN
Trich, Wet Prep: NONE SEEN
Yeast Wet Prep HPF POC: NONE SEEN

## 2020-08-30 LAB — URINALYSIS, ROUTINE W REFLEX MICROSCOPIC
Bilirubin Urine: NEGATIVE
Glucose, UA: NEGATIVE mg/dL
Hgb urine dipstick: NEGATIVE
Ketones, ur: 5 mg/dL — AB
Leukocytes,Ua: NEGATIVE
Nitrite: NEGATIVE
Protein, ur: NEGATIVE mg/dL
Specific Gravity, Urine: 1.029 (ref 1.005–1.030)
pH: 6 (ref 5.0–8.0)

## 2020-08-30 LAB — CBC WITH DIFFERENTIAL/PLATELET
Abs Immature Granulocytes: 0.02 10*3/uL (ref 0.00–0.07)
Basophils Absolute: 0.1 10*3/uL (ref 0.0–0.1)
Basophils Relative: 1 %
Eosinophils Absolute: 0.2 10*3/uL (ref 0.0–0.5)
Eosinophils Relative: 2 %
HCT: 41.9 % (ref 36.0–46.0)
Hemoglobin: 14.1 g/dL (ref 12.0–15.0)
Immature Granulocytes: 0 %
Lymphocytes Relative: 33 %
Lymphs Abs: 2.7 10*3/uL (ref 0.7–4.0)
MCH: 30.6 pg (ref 26.0–34.0)
MCHC: 33.7 g/dL (ref 30.0–36.0)
MCV: 90.9 fL (ref 80.0–100.0)
Monocytes Absolute: 0.6 10*3/uL (ref 0.1–1.0)
Monocytes Relative: 7 %
Neutro Abs: 4.6 10*3/uL (ref 1.7–7.7)
Neutrophils Relative %: 57 %
Platelets: 181 10*3/uL (ref 150–400)
RBC: 4.61 MIL/uL (ref 3.87–5.11)
RDW: 12.6 % (ref 11.5–15.5)
WBC: 8.1 10*3/uL (ref 4.0–10.5)
nRBC: 0 % (ref 0.0–0.2)

## 2020-08-30 LAB — HCG, QUANTITATIVE, PREGNANCY: hCG, Beta Chain, Quant, S: <1 m[IU]/mL (ref <5)

## 2020-08-30 LAB — POCT PREGNANCY, URINE: Preg Test, Ur: NEGATIVE

## 2020-08-30 MED ORDER — MEDROXYPROGESTERONE ACETATE 10 MG PO TABS: 10.0000 mg | ORAL_TABLET | Freq: Every day | ORAL | 0 refills | Status: DC

## 2020-08-30 NOTE — MAU Note (Signed)
D/C instructions given by Chrys Racer Conners RN

## 2020-08-30 NOTE — Progress Notes (Signed)
Has cast R hand and wrist below elbow from surgery yest to remove cyst on her wrist

## 2020-08-30 NOTE — MAU Provider Note (Signed)
History     CSN: [id number]  Arrival date and time: 08/30/20 0240   First Provider Initiated Contact with Patient 08/30/20 2047       Chief Complaint  Patient presents with  . Possible Pregnancy   Angela Branch is a 24 y.o. G5P3 GYN patient who presents to MAU with complaints of possible pregnancy and abdominal cramping. Patient reports that abdominal cramping started this morning, describes as intermittent lower abdominal cramping. Rates pain 2/10. Patient reports that she has not had menstrual cycle since having IUD removed in July. Took 3 HPT this morning that were positive, patient reports faint line. Patient reports having surgery yesterday on her wrist and "just wants to make sure fetus is okay". Patient denies vaginal bleeding or urinary symptoms. Patient reports yellow discharge in her underwear over the past couple of days.    OB History    Gravida  5   Para  3   Term  3   Preterm  0   AB  1   Living  3     SAB  1   TAB  0   Ectopic  0   Multiple  0   Live Births  3           Past Medical History:  Diagnosis Date  . Adjustment disorder with mixed anxiety and depressed mood 04/29/2016  . Anemia   . Anxiety   . Asthma   . Asthma 06/12/2012  . 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 Eczema 10/04/2014  . Faintness 10/05/2015  . Family dynamics problem 03/25/2013  . Fetal tachycardia affecting management of mother 09/21/2018  . GBS (group B Streptococcus carrier), +RV culture, currently pregnant 11/14/2015  . GERD (gastroesophageal reflux disease) 09/21/2015  . H/O scabies 11/14/2015   Treated with permetherin   . 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 Loss of weight 03/25/2013  . Low back pain 09/20/2013  . Menorrhagia 09/28/2013  . Migraine headache 02/23/2014  . Sleep apnea 04/14/2014  . 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 Supervision of low-risk pregnancy 06/28/2019    Nursing Staff Provider  Office Location  [facility] Dating   early u/s Language  English Anatomy US   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  Flu Vaccine   Genetic Screen  NIPS: low risk female      TDaP vaccine    Hgb A1C or  GTT  Third trimester  Rhogam  n/a   LAB RESULTS  Feeding Plan Breast/Bottle Blood Type A/Positive/-- (07/16 1206)  Contraception undecided Antibody Negative (07/16  . Thyroid disease     Past Surgical History:  Procedure Laterality Date  . TOOTH EXTRACTION    . WRIST SURGERY  08/30/2020    Family History  Problem Relation Age of Onset  . Asthma Father   . Diabetes Maternal Grandmother   . Hyperlipidemia Paternal Grandfather   . Heart disease Paternal Grandfather   . Cancer - Colon Maternal Grandfather   . Mental illness Neg Hx   . Birth defects Neg Hx   . Kidney disease Neg Hx   . Hypertension Neg Hx     Social History   Tobacco Use  . Smoking status: Never Smoker  . Smokeless tobacco: Never Used  Vaping Use  . Vaping Use: Never used  Substance Use Topics  . Alcohol use: No    Alcohol/week: 0.0 standard drinks  . Drug  use: No    Allergies:  Allergies  Allergen Reactions  . Azithromycin Diarrhea    Diarrhea, nausea/vomiting, IV burns vein Diarrhea, nausea/vomiting, IV burns vein    Medications Prior to Admission  Medication Sig Dispense Refill Last Dose  . cephALEXin (KEFLEX) 500 MG capsule Take 500 mg by mouth 4 (four) times daily.   08/30/2020 at Unknown time  . HYDROcodone-acetaminophen (NORCO) 7.5-325 MG tablet Take 1 tablet by mouth every 6 (six) hours as needed for moderate pain.     Marland Kitchen ondansetron (ZOFRAN) 4 MG tablet Take 4 mg by mouth every 8 (eight) hours as needed for nausea or vomiting.     Marland Kitchen etonogestrel-ethinyl estradiol (NUVARING) 0.12-0.015 MG/24HR vaginal ring Insert vaginally and leave in place for 3 consecutive weeks, then remove for 1 week. 1 each 12 More than a month at Unknown time    Review of Systems  Constitutional: Negative.    Respiratory: Negative.   Cardiovascular: Negative.   Gastrointestinal: Positive for abdominal pain. Negative for constipation, diarrhea, nausea and vomiting.       Lower abdominal cramping  Genitourinary: Positive for vaginal discharge. Negative for difficulty urinating, dysuria, frequency, pelvic pain, urgency and vaginal bleeding.  Musculoskeletal: Negative.   Neurological: Negative.   Psychiatric/Behavioral: Negative.    Physical Exam   Blood pressure 109/64, pulse 60, temperature 97.7 F (36.5 C), resp. rate 16, height 5\' 3"  (1.6 m), weight 63 kg, last menstrual period 07/17/2020, unknown if currently breastfeeding.  Physical Exam HENT:     Head: Normocephalic.  Cardiovascular:     Rate and Rhythm: Normal rate and regular rhythm.  Pulmonary:     Effort: Pulmonary effort is normal. No respiratory distress.     Breath sounds: Normal breath sounds. No wheezing.  Abdominal:     General: There is no distension.     Palpations: Abdomen is soft. There is no mass.     Tenderness: There is no abdominal tenderness. There is no guarding.  Genitourinary:    Comments: Bimanual exam: Cervix 0/long/high, firm, anterior, neg CMT, uterus nontender, nonenlarged, adnexa without tenderness, enlargement, or mass  Skin:    General: Skin is warm and dry.  Neurological:     Mental Status: She is alert and oriented to person, place, and time.  Psychiatric:        Mood and Affect: Mood normal.        Behavior: Behavior normal.        Thought Content: Thought content normal.     MAU Course  Procedures  MDM  POCT urine pregnancy test in MAU tonight negative, patient is unsure how that can be correct as she reports 3 HPT that were positive and taken today. Patient reports faint line on HPT.   Discussed with patient that serum pregnancy test can be ordered to assess for possibility of pregnancy.   Orders Placed This Encounter  Procedures  . Wet prep, genital  . Urinalysis, Routine w  reflex microscopic Urine, Clean Catch  . hCG, quantitative, pregnancy  . CBC with Differential/Platelet  . Pregnancy, urine POC   Labs reviewed:  Results for orders placed or performed during the hospital encounter of 08/30/20 (from the past 24 hour(s))  Urinalysis, Routine w reflex microscopic Urine, Clean Catch     Status: Abnormal   Collection Time: 08/30/20  8:16 PM  Result Value Ref Range   Color, Urine YELLOW YELLOW   APPearance CLEAR CLEAR   Specific Gravity, Urine 1.029 1.005 - 1.030  pH 6.0 5.0 - 8.0   Glucose, UA NEGATIVE NEGATIVE mg/dL   Hgb urine dipstick NEGATIVE NEGATIVE   Bilirubin Urine NEGATIVE NEGATIVE   Ketones, ur 5 (A) NEGATIVE mg/dL   Protein, ur NEGATIVE NEGATIVE mg/dL   Nitrite NEGATIVE NEGATIVE   Leukocytes,Ua NEGATIVE NEGATIVE  Pregnancy, urine POC     Status: None   Collection Time: 08/30/20  8:18 PM  Result Value Ref Range   Preg Test, Ur NEGATIVE NEGATIVE  Wet prep, genital     Status: Abnormal   Collection Time: 08/30/20  8:56 PM   Specimen: PATH Cytology Cervicovaginal Ancillary Only  Result Value Ref Range   Yeast Wet Prep HPF POC NONE SEEN NONE SEEN   Trich, Wet Prep NONE SEEN NONE SEEN   Clue Cells Wet Prep HPF POC NONE SEEN NONE SEEN   WBC, Wet Prep HPF POC FEW (A) NONE SEEN   Sperm NONE SEEN   hCG, quantitative, pregnancy     Status: None   Collection Time: 08/30/20  9:31 PM  Result Value Ref Range   hCG, Beta Chain, Quant, S <1 <5 mIU/mL  CBC with Differential/Platelet     Status: None   Collection Time: 08/30/20  9:31 PM  Result Value Ref Range   WBC 8.1 4.0 - 10.5 K/uL   RBC 4.61 3.87 - 5.11 MIL/uL   Hemoglobin 14.1 12.0 - 15.0 g/dL   HCT 41.9 36 - 46 %   MCV 90.9 80.0 - 100.0 fL   MCH 30.6 26.0 - 34.0 pg   MCHC 33.7 30.0 - 36.0 g/dL   RDW 12.6 11.5 - 15.5 %   Platelets 181 150 - 400 K/uL   nRBC 0.0 0.0 - 0.2 %   Neutrophils Relative % 57 %   Neutro Abs 4.6 1.7 - 7.7 K/uL   Lymphocytes Relative 33 %   Lymphs Abs 2.7 0.7 -  4.0 K/uL   Monocytes Relative 7 %   Monocytes Absolute 0.6 0 - 1 K/uL   Eosinophils Relative 2 %   Eosinophils Absolute 0.2 0 - 0 K/uL   Basophils Relative 1 %   Basophils Absolute 0.1 0 - 0 K/uL   Immature Granulocytes 0 %   Abs Immature Granulocytes 0.02 0.00 - 0.07 K/uL   UPT and HCG negative in MAU today - discussed with patient.  Patient is concerned that she has not started cycle. Discussed with patient option of progesterone challenge and follow up in the office in 2 weeks.  Patient agrees to progesterone challenge, Rx sent to pharmacy of choice.   Discussed reasons to return to MAU. Follow up as scheduled in the office. Return to MAU as needed. Pt stable at time of discharge.   Assessment and Plan   1. Not currently pregnant   2. Abdominal cramping   3. Secondary amenorrhea    Discharge home Follow up as scheduled in the office Return to MAU as needed for reasons discussed and/or emergencies  Rx for Provera challenge    [place] [facility] [place] [facility]. Schedule an appointment as soon as possible for a visit.   Specialty: Obstetrics and Gynecology Why: Make appointment to be seen for follow up  Contact information: [place] [id number] [phone]             Allergies as of 08/30/2020      Reactions   Azithromycin Diarrhea   Diarrhea, nausea/vomiting, IV burns  vein Diarrhea, nausea/vomiting, IV burns vein      Medication List    STOP taking these medications   cephALEXin 500 MG capsule Commonly known as: KEFLEX     TAKE these medications   etonogestrel-ethinyl estradiol 0.12-0.015 MG/24HR vaginal ring Commonly known as: NUVARING Insert vaginally and leave in place for 3 consecutive weeks, then remove for 1 week.   HYDROcodone-acetaminophen 7.5-325 MG tablet Commonly known as: NORCO Take 1 tablet by mouth every 6 (six) hours as needed for moderate pain.    medroxyPROGESTERone 10 MG tablet Commonly known as: PROVERA Take 1 tablet (10 mg total) by mouth daily. Use for ten days   ondansetron 4 MG tablet Commonly known as: ZOFRAN Take 4 mg by mouth every 8 (eight) hours as needed for nausea or vomiting.       Lajean Manes CNM 08/30/2020, 11:43 PM

## 2020-08-30 NOTE — MAU Note (Addendum)
No period since July 26 so took upt today and positive. I had surgery on my hand yesterday and I want to be sure fetus is ok. Some mild cramping but no bleeding. IUD removed in July

## 2020-08-31 LAB — GC/CHLAMYDIA PROBE AMP (~~LOC~~) NOT AT ARMC
Chlamydia: NEGATIVE
Comment: NEGATIVE
Comment: NORMAL
Neisseria Gonorrhea: NEGATIVE

## 2020-09-04 ENCOUNTER — Telehealth: Payer: Self-pay | Admitting: Obstetrics & Gynecology

## 2020-09-04 NOTE — Telephone Encounter (Signed)
Attempted to contact patient to get her scheduled for a follow-up appointment secondary amenorrhea, progesterone challenge prescribed today in MAU. No answer, left voicemail for patient to give the office a call back to be scheduled.

## 2020-09-07 ENCOUNTER — Other Ambulatory Visit: Payer: Self-pay

## 2020-09-07 ENCOUNTER — Emergency Department (HOSPITAL_COMMUNITY): Payer: Medicaid Other

## 2020-09-07 ENCOUNTER — Encounter (HOSPITAL_COMMUNITY): Payer: Self-pay

## 2020-09-07 ENCOUNTER — Emergency Department (HOSPITAL_COMMUNITY)
Admission: EM | Admit: 2020-09-07 | Discharge: 2020-09-07 | Disposition: A | Discharge status: Home / Self Care | Payer: Medicaid Other | Attending: Emergency Medicine | Admitting: Emergency Medicine

## 2020-09-07 DIAGNOSIS — Y29XXXA Contact with blunt object, undetermined intent, initial encounter: Secondary | ICD-10-CM | POA: Insufficient documentation

## 2020-09-07 DIAGNOSIS — M25531 Pain in right wrist: Secondary | ICD-10-CM | POA: Insufficient documentation

## 2020-09-07 DIAGNOSIS — Y92018 Other place in single-family (private) house as the place of occurrence of the external cause: Secondary | ICD-10-CM | POA: Insufficient documentation

## 2020-09-07 DIAGNOSIS — J45909 Unspecified asthma, uncomplicated: Secondary | ICD-10-CM | POA: Diagnosis not present

## 2020-09-07 DIAGNOSIS — S6991XA Unspecified injury of right wrist, hand and finger(s), initial encounter: Secondary | ICD-10-CM | POA: Diagnosis not present

## 2020-09-07 DIAGNOSIS — Y9383 Activity, rough housing and horseplay: Secondary | ICD-10-CM | POA: Diagnosis not present

## 2020-09-07 DIAGNOSIS — S61511A Laceration without foreign body of right wrist, initial encounter: Secondary | ICD-10-CM | POA: Diagnosis not present

## 2020-09-07 NOTE — Discharge Instructions (Signed)
You can take ibuprofen as needed for management of your pain.  Please follow the instructions on the bottle.  Please follow-up with your surgeon regarding your right wrist.  You can return to the ER with new or worsening symptoms.  It was a pleasure to meet you.

## 2020-09-07 NOTE — ED Notes (Signed)
[facility] [employer]

## 2020-09-07 NOTE — ED Triage Notes (Addendum)
Patient states she removed her right wrist splint 6 days ago because her daughter spilt liquid on it. Patient had a ganglion cyst removal on 08/29/20. Patient still has sutures to the right wrist area. Patient states, "I don't want it to get infected." patient also reports that she hit her right hand/wrist on a storage shelf.

## 2020-09-07 NOTE — ED Provider Notes (Signed)
[facility] DEPT Provider Note   CSN: [id number] Arrival date & time: 09/07/20  0240     History Chief Complaint  Patient presents with  . Wrist Injury    Angela Branch is a 24 y.o. female.  HPI Patient is a 24 year old female with a medical history as noted below.  Patient states that on September 7 she had a ganglion cyst removed on the dorsum of her right wrist.  She was placed in a right wrist splint.  She states that one of her children spilled soup on the splint so she had to remove it.  Last night she was playing with her children and struck her right wrist on a shelf.  She reports pain in the region.  No increased warmth, erythema, fevers, chills, discharge.  She presents to the emergency department today for reevaluation as well as to have her wrist rewrapped.    Past Medical History:  Diagnosis Date  . Adjustment disorder with mixed anxiety and depressed mood 04/29/2016  . Anemia   . Anxiety   . Asthma   . Asthma 06/12/2012  . 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 Eczema 10/04/2014  . Faintness 10/05/2015  . Family dynamics problem 03/25/2013  . Fetal tachycardia affecting management of mother 09/21/2018  . GBS (group B Streptococcus carrier), +RV culture, currently pregnant 11/14/2015  . GERD (gastroesophageal reflux disease) 09/21/2015  . H/O scabies 11/14/2015   Treated with permetherin   . 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 Loss of weight 03/25/2013  . Low back pain 09/20/2013  . Menorrhagia 09/28/2013  . Migraine headache 02/23/2014  . Sleep apnea 04/14/2014  . Substance abuse ([place]) 03/24/2015   + cannabis   . Supervision of low-risk pregnancy 06/28/2019    Nursing Staff Provider Office Location  [facility] Dating   early u/s Language  English Anatomy US   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  Flu Vaccine   Genetic Screen  NIPS: low risk female      TDaP vaccine    Hgb A1C or   GTT  Third trimester  Rhogam  n/a   LAB RESULTS  Feeding Plan Breast/Bottle Blood Type A/Positive/-- (07/16 1206)  Contraception undecided Antibody Negative (07/16  . Thyroid disease     Patient Active Problem List   Diagnosis Date Noted  . 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 Thyroid disease 03/12/2018  . Adjustment disorder with mixed anxiety and depressed mood 04/29/2016  . GERD (gastroesophageal reflux disease) 09/21/2015  . Eczema 10/04/2014  . Asthma 06/12/2012    Past Surgical History:  Procedure Laterality Date  . TOOTH EXTRACTION    . WRIST SURGERY  08/30/2020     OB History    Gravida  5   Para  3   Term  3   Preterm  0   AB  1   Living  3     SAB  1   TAB  0   Ectopic  0   Multiple  0   Live Births  3           Family History  Problem Relation Age of Onset  . Asthma Father   . Diabetes Maternal Grandmother   . Hyperlipidemia Paternal Grandfather   . Heart disease Paternal Grandfather   . Cancer - Colon Maternal Grandfather   . Mental illness Neg  Hx   . Birth defects Neg Hx   . Kidney disease Neg Hx   . Hypertension Neg Hx     Social History   Tobacco Use  . Smoking status: Never Smoker  . Smokeless tobacco: Never Used  Vaping Use  . Vaping Use: Never used  Substance Use Topics  . Alcohol use: No    Alcohol/week: 0.0 standard drinks  . Drug use: No    Home Medications Prior to Admission medications   Medication Sig Start Date End Date Taking? Authorizing Provider  etonogestrel-ethinyl estradiol (NUVARING) 0.12-0.015 MG/24HR vaginal ring Insert vaginally and leave in place for 3 consecutive weeks, then remove for 1 week. 06/22/20   Woodroe Mode, MD  HYDROcodone-acetaminophen (NORCO) 7.5-325 MG tablet Take 1 tablet by mouth every 6 (six) hours as needed for moderate pain.    [provider]  medroxyPROGESTERone (PROVERA) 10 MG tablet Take 1 tablet (10 mg  total) by mouth daily. Use for ten days 08/30/20   Lajean Manes, CNM  ondansetron (ZOFRAN) 4 MG tablet Take 4 mg by mouth every 8 (eight) hours as needed for nausea or vomiting.    [provider]    Allergies    Azithromycin  Review of Systems   Review of Systems  Constitutional: Negative for chills and fever.  Gastrointestinal: Negative for nausea and vomiting.  Musculoskeletal: Positive for arthralgias and myalgias.  Skin: Positive for wound. Negative for color change.   Physical Exam Updated Vital Signs BP 100/72   Pulse 67   Temp 98 F (36.7 C) (Oral)   Resp 16   Ht $R'5\' 3"'eb$  (1.6 m)   Wt 63 kg   LMP 07/17/2020   SpO2 97%   Breastfeeding Unknown   BMI 24.62 kg/m   Physical Exam Vitals and nursing note reviewed.  Constitutional:      General: She is not in acute distress.    Appearance: Normal appearance. She is not ill-appearing, toxic-appearing or diaphoretic.  HENT:     Head: Normocephalic and atraumatic.     Right Ear: External ear normal.     Left Ear: External ear normal.     Nose: Nose normal.     Mouth/Throat:     Mouth: Mucous membranes are moist.     Pharynx: Oropharynx is clear. No oropharyngeal exudate or posterior oropharyngeal erythema.  Eyes:     Extraocular Movements: Extraocular movements intact.  Cardiovascular:     Rate and Rhythm: Normal rate and regular rhythm.     Pulses: Normal pulses.     Heart sounds: Normal heart sounds. No murmur heard.  No friction rub. No gallop.      Comments: 2+ radial pulses. Pulmonary:     Effort: Pulmonary effort is normal. No respiratory distress.     Breath sounds: Normal breath sounds. No stridor. No wheezing, rhonchi or rales.  Abdominal:     General: Abdomen is flat.     Tenderness: There is no abdominal tenderness.  Musculoskeletal:        General: Normal range of motion.     Cervical back: Normal range of motion and neck supple. No tenderness.  Skin:    General: Skin is warm and dry.      Comments: 4 cm linear laceration noted to the dorsum of the right wrist.  Wound is well approximated.  No overlying erythema or edema.  No increased warmth.  No discharge noted with manipulation of the site.  Minimal tenderness overlying and surrounding  the wound.  Neurological:     General: No focal deficit present.     Mental Status: She is alert and oriented to person, place, and time.     Comments: Distal sensation intact.  Patient is moving fingers of the right hand with ease.  Psychiatric:        Mood and Affect: Mood normal.        Behavior: Behavior normal.    ED Results / Procedures / Treatments   Labs (all labs ordered are listed, but only abnormal results are displayed) Labs Reviewed - No data to display  EKG None  Radiology DG Wrist Complete Right  Result Date: 09/07/2020 CLINICAL DATA:  Hit wrist against solid object EXAM: RIGHT WRIST - COMPLETE 3+ VIEW COMPARISON:  August 17, 2018 FINDINGS: Frontal, oblique, lateral, and ulnar deviation scaphoid images were obtained. No fracture or dislocation. Joint spaces appear normal. No erosive change. IMPRESSION: No fracture or dislocation.  No appreciable arthropathy. Electronically Signed   By: Lowella Grip III M.D.   On: 09/07/2020 09:05   Procedures Procedures   Medications Ordered in ED Medications - No data to display  ED Course  I have reviewed the triage vital signs and the nursing notes.  Pertinent labs & imaging results that were available during my care of the patient were reviewed by me and considered in my medical decision making (see chart for details).    MDM Rules/Calculators/A&P                          Pt is a 24 y.o. female that presents with a history, physical exam, and ED Clinical Course as noted above.   Patient presents to the emergency department for wound check as well as a new splint to the right wrist.  Patient was concern for infection in the region.  There is no erythema, edema,  increased warmth in the region.  Patient is afebrile.  Not tachycardic.  Physical exam is extremely reassuring.  She is neurovascularly intact in the right upper extremity.  X-rays negative.  I rewrapped the right wrist and patient was given a wrist splint.  Recommended that she follow-up with her surgeon.  She understands she can return to the ER with new or worsening symptoms.  Patient is hemodynamically stable and in NAD at the time of d/c. Evaluation does not show pathology that would require ongoing emergent intervention or inpatient treatment. I explained the diagnosis to the patient. Patient is comfortable with above plan and is stable for discharge at this time. All questions were answered prior to disposition. Strict return precautions for returning to the ED were discussed. Encouraged follow up with PCP.    An After Visit Summary was printed and given to the patient.  Patient discharged to home/self care.  Condition at discharge: Stable  Note: Portions of this report may have been transcribed using voice recognition software. Every effort was made to ensure accuracy; however, inadvertent computerized transcription errors may be present.   Final Clinical Impression(s) / ED Diagnoses Final diagnoses:  Right wrist pain    Rx / DC Orders ED Discharge Orders    None       Rayna Sexton, PA-C 09/07/20 1229    Fredia Sorrow, MD 09/29/20 [phone]

## 2020-09-07 NOTE — Progress Notes (Signed)
Orthopedic Tech Progress Note Patient Details:  Angela Branch [birth year] [id number]  Ortho Devices Type of Ortho Device: Wrist splint Ortho Device/Splint Location: right Ortho Device/Splint Interventions: Application   Post Interventions Patient Tolerated: Well Instructions Provided: Care of device   [place] Pink 09/07/2020, 1:04 PM

## 2020-09-11 ENCOUNTER — Telehealth: Payer: Self-pay | Admitting: *Deleted

## 2020-09-11 NOTE — Telephone Encounter (Signed)
Patient returned call and states she already has a PCP and does not need further assistance . Angela Branch PEC [phone]

## 2020-09-11 NOTE — Telephone Encounter (Signed)
Called patient to offer assistance with a ED follow up appointment. Left message and call back number on voicemail. Lolitha Tortora PEC [phone]

## 2020-09-11 NOTE — Telephone Encounter (Signed)
Transition Care Management Follow-up Telephone Call  Date of discharge and from where: 09/07/2020 [facility] ED  How have you been since you were released from the hospital? No  Any questions or concerns? No  Items Reviewed:  Did the pt receive and understand the discharge instructions provided? Yes   Medications obtained and verified? Yes   Any new allergies since your discharge? No   Dietary orders reviewed? Yes  Do you have support at home? Yes   Functional Questionnaire: (I = Independent and D = Dependent) ADLs: I  Bathing/Dressing- I  Meal Prep- I  Eating- I  Maintaining continence- I  Transferring/Ambulation- I  Managing Meds- I  Follow up appointments reviewed:   PCP Hospital f/u appt confirmed? No  - PT REFUSED FOLLOW UP [facility] f/u appt confirmed? No    Are transportation arrangements needed? No   If their condition worsens, is the pt aware to call PCP or go to the Emergency Dept.? Yes  Was the patient provided with contact information for the PCP's office or ED? Yes  Was to pt encouraged to call back with questions or concerns? Yes

## 2020-09-18 ENCOUNTER — Encounter: Payer: Self-pay | Admitting: Obstetrics & Gynecology

## 2020-09-18 ENCOUNTER — Other Ambulatory Visit: Payer: Self-pay

## 2020-09-18 ENCOUNTER — Ambulatory Visit (INDEPENDENT_AMBULATORY_CARE_PROVIDER_SITE_OTHER): Payer: Medicaid Other | Admitting: Obstetrics & Gynecology

## 2020-09-18 VITALS — BP 117/74 | HR 71 | Ht 63.0 in | Wt 137.8 lb

## 2020-09-18 DIAGNOSIS — K649 Unspecified hemorrhoids: Secondary | ICD-10-CM

## 2020-09-18 DIAGNOSIS — Z3202 Encounter for pregnancy test, result negative: Secondary | ICD-10-CM | POA: Diagnosis not present

## 2020-09-18 LAB — POCT PREGNANCY, URINE: Preg Test, Ur: NEGATIVE

## 2020-09-18 MED ORDER — HYDROCORTISONE (PERIANAL) 2.5 % EX CREA: 1.0000 "application " | TOPICAL_CREAM | Freq: Two times a day (BID) | CUTANEOUS | 0 refills | Status: DC

## 2020-09-18 MED ORDER — DOCUSATE SODIUM 100 MG PO CAPS: 100.0000 mg | ORAL_CAPSULE | Freq: Two times a day (BID) | ORAL | 2 refills | Status: DC | PRN

## 2020-09-18 NOTE — Patient Instructions (Signed)

## 2020-09-18 NOTE — Progress Notes (Signed)
Patient ID: Angela Branch, female   DOB: Aug 17, 1996, 23 y.o.   MRN: [id number]  Chief Complaint  Patient presents with  . Gynecologic Exam    HPI Angela Branch is a 24 y.o. female.  [id number] No LMP recorded. (Menstrual status: Irregular Periods). Her Mirena was removed 06/22/20 due to concern with amenorrhea and she used NuvaRing for only a few days. When she removed it she has a small amount of bleeding. Her concern today is hemorrhoid and some vulvar irritation due to her clothing rubbing. HPI  Past Medical History:  Diagnosis Date  . Adjustment disorder with mixed anxiety and depressed mood 04/29/2016  . Anemia   . Anxiety   . Asthma   . Asthma 06/12/2012  . 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 Eczema 10/04/2014  . Faintness 10/05/2015  . Family dynamics problem 03/25/2013  . Fetal tachycardia affecting management of mother 09/21/2018  . GBS (group B Streptococcus carrier), +RV culture, currently pregnant 11/14/2015  . GERD (gastroesophageal reflux disease) 09/21/2015  . H/O scabies 11/14/2015   Treated with permetherin   . 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 Loss of weight 03/25/2013  . Low back pain 09/20/2013  . Menorrhagia 09/28/2013  . Migraine headache 02/23/2014  . Sleep apnea 04/14/2014  . 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 Supervision of low-risk pregnancy 06/28/2019    Nursing Staff Provider Office Location  [facility] Dating   early u/s Language  English Anatomy US   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  Flu Vaccine   Genetic Screen  NIPS: low risk female      TDaP vaccine    Hgb A1C or  GTT  Third trimester  Rhogam  n/a   LAB RESULTS  Feeding Plan Breast/Bottle Blood Type A/Positive/-- (07/16 1206)  Contraception undecided Antibody Negative (07/16  . Thyroid disease     Past Surgical History:  Procedure Laterality Date  . TOOTH EXTRACTION    . WRIST SURGERY  08/30/2020    Family History   Problem Relation Age of Onset  . Asthma Father   . Diabetes Maternal Grandmother   . Hyperlipidemia Paternal Grandfather   . Heart disease Paternal Grandfather   . Cancer - Colon Maternal Grandfather   . Mental illness Neg Hx   . Birth defects Neg Hx   . Kidney disease Neg Hx   . Hypertension Neg Hx     Social History Social History   Tobacco Use  . Smoking status: Never Smoker  . Smokeless tobacco: Never Used  Vaping Use  . Vaping Use: Never used  Substance Use Topics  . Alcohol use: No    Alcohol/week: 0.0 standard drinks  . Drug use: No    Allergies  Allergen Reactions  . Azithromycin Diarrhea    Diarrhea, nausea/vomiting, IV burns vein Diarrhea, nausea/vomiting, IV burns vein    Current Outpatient Medications  Medication Sig Dispense Refill  . docusate sodium (COLACE) 100 MG capsule Take 1 capsule (100 mg total) by mouth 2 (two) times daily as needed. 30 capsule 2  . etonogestrel-ethinyl estradiol (NUVARING) 0.12-0.015 MG/24HR vaginal ring Insert vaginally and leave in place for 3 consecutive weeks, then remove for 1 week. 1 each 12  . HYDROcodone-acetaminophen (NORCO) 7.5-325 MG tablet Take 1 tablet by mouth every 6 (six) hours as needed for moderate pain. (Patient not taking: Reported on 09/18/2020)    . hydrocortisone (ANUSOL-HC) 2.5 %  rectal cream Place 1 application rectally 2 (two) times daily. 30 g 0  . medroxyPROGESTERone (PROVERA) 10 MG tablet Take 1 tablet (10 mg total) by mouth daily. Use for ten days (Patient not taking: Reported on 09/18/2020) 10 tablet 0  . ondansetron (ZOFRAN) 4 MG tablet Take 4 mg by mouth every 8 (eight) hours as needed for nausea or vomiting. (Patient not taking: Reported on 09/18/2020)     No current facility-administered medications for this visit.    Review of Systems Review of Systems  Constitutional: Negative.   Gastrointestinal: Positive for anal bleeding (hemorrhoid) and blood in stool.  Genitourinary: Negative for vaginal  bleeding and vaginal discharge.    Blood pressure 117/74, pulse 71, height $RemoveBe'5\' 3"'WlfUjSebA$  (1.6 m), weight 137 lb 12.8 oz (62.5 kg), unknown if currently breastfeeding.  Physical Exam Physical Exam Vitals and nursing note reviewed. Exam conducted with a chaperone present.  Constitutional:      Appearance: Normal appearance.  Pulmonary:     Effort: Pulmonary effort is normal.  Abdominal:     General: There is no distension.  Genitourinary:    General: Normal vulva.     Vagina: No vaginal discharge.     Comments: Small external hemorrhoid with no thrombosis or inflammation Neurological:     Mental Status: She is alert.     Data Reviewed   Assessment Sx hemorrhoid  Does not want a birth control method currently as she wants to see if her regular cycle will return  Plan Anusol and Colace prescribed She was given instructions and will return if sx do not improve or f/u with pcp    Emeterio Reeve 09/18/2020, 3:48 PM

## 2020-10-12 ENCOUNTER — Encounter: Payer: Self-pay | Admitting: Cardiology

## 2020-10-12 ENCOUNTER — Other Ambulatory Visit: Payer: Self-pay

## 2020-10-12 ENCOUNTER — Ambulatory Visit (INDEPENDENT_AMBULATORY_CARE_PROVIDER_SITE_OTHER): Payer: Medicaid Other | Admitting: Cardiology

## 2020-10-12 VITALS — BP 112/60 | HR 65 | Ht 63.0 in | Wt 143.6 lb

## 2020-10-12 DIAGNOSIS — Z148 Genetic carrier of other disease: Secondary | ICD-10-CM | POA: Diagnosis not present

## 2020-10-12 DIAGNOSIS — R55 Syncope and collapse: Secondary | ICD-10-CM | POA: Diagnosis not present

## 2020-10-12 DIAGNOSIS — R002 Palpitations: Secondary | ICD-10-CM | POA: Diagnosis not present

## 2020-10-12 NOTE — Patient Instructions (Signed)
Dr. Radford Pax recommends that you purchase an [facility] device.  Medication Instructions:  Your physician recommends that you continue on your current medications as directed. Please refer to the Current Medication list given to you today.  *If you need a refill on your cardiac medications before your next appointment, please call your pharmacy*   Testing/Procedures: Your physician has requested that you have a cardiac MRI. Cardiac MRI uses a computer to create images of your heart as its beating, producing both still and moving pictures of your heart and major blood vessels. For further information please visit [email or web address] Please follow the instruction sheet given to you today for more information.  Follow-Up: At [facility], you and your health needs are our priority.  As part of our continuing mission to provide you with exceptional heart care, we have created designated Provider Care Teams.  These Care Teams include your primary Cardiologist (physician) and Advanced Practice Providers (APPs -  Physician Assistants and Nurse Practitioners) who all work together to provide you with the care you need, when you need it.  Your next appointment:   1 year(s)  The format for your next appointment:   In Person  Provider:   You may see Fransico Him, MD or one of the following Advanced Practice Providers on your designated Care Team:    Melina Copa, PA-C  Ermalinda Barrios, PA-C

## 2020-10-12 NOTE — Addendum Note (Signed)
Addended by: Antonieta Iba on: 10/12/2020 03:41 PM   Modules accepted: Orders

## 2020-10-12 NOTE — Progress Notes (Signed)
Cardiology Office Note    Date:  10/12/2020   ID:  Angela Branch, DOB 07/16/1996, MRN [id number]  PCP:  System, Provider Not In  Cardiologist:  Fransico Him, MD   Chief Complaint  Patient presents with   Follow-up    syncope    History of Present Illness:  Angela Branch is a 24 y.o. 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  Her great uncle has fully expressed Duchenne's but no other family members. She is here today for followup and is doing well.  She randomly will have a sharp pain in her chest that is very brief. She tells me that she always feels SOB and says that it feels like she cannot take a deep breath in.  She denies any chest heaviness or pressure, PND, orthopnea, LE edema or syncope. She has had some occasional dizziness but if she drinks water and sits down is subsides.  She is compliant with her meds and is tolerating meds with no SE.    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   Needs genetic counseling   Eczema 10/04/2014   Faintness 10/05/2015   Family dynamics problem 03/25/2013   Fetal tachycardia affecting management of mother 09/21/2018   GBS (group B Streptococcus carrier), +RV culture, currently pregnant 11/14/2015   GERD (gastroesophageal reflux disease) 09/21/2015   H/O scabies 11/14/2015   Treated with permetherin    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   Loss of weight 03/25/2013   Low back pain 09/20/2013   Menorrhagia 09/28/2013   Migraine headache 02/23/2014   Sleep apnea 04/14/2014   Substance abuse ([place]) 03/24/2015   + cannabis    Supervision of low-risk pregnancy 06/28/2019    Nursing Staff Provider Office Location  [facility] Dating   early u/s Language  English Anatomy US   Bilateral CPC cyst and echogenic focus  was observed.  Flu Vaccine   Genetic Screen  NIPS: low risk female      TDaP vaccine    Hgb A1C or  GTT  Third trimester  Rhogam  n/a   LAB RESULTS  Feeding Plan Breast/Bottle Blood Type A/Positive/-- (07/16 1206)  Contraception undecided Antibody Negative (07/16   Thyroid disease     Past Surgical History:  Procedure Laterality Date   TOOTH EXTRACTION     WRIST SURGERY  08/30/2020    Current Medications: No outpatient medications have been marked as taking for the 10/12/20 encounter (Office Visit) with Sueanne Margarita, MD.    Allergies:   Azithromycin   Social History   Socioeconomic History   Marital status: Single    Spouse name: Not on file   Number of children: Not on file   Years of education: 9th   Highest education level: Not on file  Occupational History    Employer: UNEMPLOYED  Tobacco Use   Smoking status: Never Smoker   Smokeless tobacco: Never Used  Scientific laboratory technician Use: Never used  Substance and Sexual Activity   Alcohol use: No    Alcohol/week: 0.0 standard drinks   Drug use: No   Sexual activity: Yes    Birth control/protection: None  Other Topics Concern   Not on file  Social History Narrative  Not on file   Social Determinants of Health   Financial Resource Strain:    Difficulty of Paying Living Expenses: Not on file  Food Insecurity: No Food Insecurity   Worried About Running Out of Food in the Last Year: Never true   Ran Out of Food in the Last Year: Never true  Transportation Needs: No Transportation Needs   Lack of Transportation (Medical): No   Lack of Transportation (Non-Medical): No  Physical Activity:    Days of Exercise per Week: Not on file   Minutes of Exercise per Session: Not on file  Stress:    Feeling of Stress : Not on file  Social Connections:    Frequency of Communication with Friends and Family: Not on file   Frequency of Social Gatherings with Friends and Family: Not on file   Attends  Religious Services: Not on file   Active Member of Clubs or Organizations: Not on file   Attends Archivist Meetings: Not on file   Marital Status: Not on file     Family History:  The patient's family history includes Asthma in her father; Cancer - Colon in her maternal grandfather; Diabetes in her maternal grandmother; Heart disease in her paternal grandfather; Hyperlipidemia in her paternal grandfather.   ROS:   Please see the history of present illness.    ROS All other systems reviewed and are negative.  No flowsheet data found.     PHYSICAL EXAM:   VS:  BP 112/60    Pulse 65    Ht $R'5\' 3"'kG$  (1.6 m)    Wt 143 lb 9.6 oz (65.1 kg)    SpO2 97%    BMI 25.44 kg/m    GEN: Well nourished, well developed in no acute distress HEENT: Normal NECK: No JVD; No carotid bruits LYMPHATICS: No lymphadenopathy CARDIAC:RRR, no murmurs, rubs, gallops RESPIRATORY:  Clear to auscultation without rales, wheezing or rhonchi  ABDOMEN: Soft, non-tender, non-distended MUSCULOSKELETAL:  No edema; No deformity  SKIN: Warm and dry NEUROLOGIC:  Alert and oriented x 3 PSYCHIATRIC:  Normal affect    Wt Readings from Last 3 Encounters:  10/12/20 143 lb 9.6 oz (65.1 kg)  09/18/20 137 lb 12.8 oz (62.5 kg)  09/07/20 139 lb (63 kg)      Studies/Labs Reviewed:   EKG:  EKG is ordered today and showed NSR with no ST changes and normal intervals  Recent Labs: 11/03/2019: TSH 0.875 08/30/2020: Hemoglobin 14.1; Platelets 181   Lipid Panel No results found for: CHOL, TRIG, HDL, CHOLHDL, VLDL, LDLCALC, LDLDIRECT  Additional studies/ records that were reviewed today include:  Office notes from OB    ASSESSMENT:    1. 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 Syncope, unspecified syncope type   3. Palpitations      PLAN:  In order of problems listed above:  1. Carrier of Duchenne's muscular dystrophy -EF by echo a year ago was 60-65% -I will get a cardiac MRI to assess LVF  2.   Syncope -vasovagal in origin and both times occurred while pregnant and after going to the bathroom -she gives a hx of syncope while getting nipples pierced and said she passed out because of the pain -encouraged her to stay hydrated -echo with normal LVF -no arrhythmias on heart monitor but she only wore it for 2 days due to being allergic to the EKG leads  3.  Palpitations -she is very allergic to EKG leads so will not be able to  get another heart monitor -I have recommended the Kardia monitor and send me strips when she has palpitations  Medication Adjustments/Labs and Tests Ordered: Current medicines are reviewed at length with the patient today.  Concerns regarding medicines are outlined above.  Medication changes, Labs and Tests ordered today are listed in the Patient Instructions below.  There are no Patient Instructions on file for this visit.   Signed, Fransico Him, MD  10/12/2020 3:34 PM    [address], [place], [place]  [address] Phone: [phone]; Fax: [phone]

## 2020-10-16 NOTE — Addendum Note (Signed)
Addended by: Georgiann Cocker on: 10/16/2020 12:20 PM   Modules accepted: Orders

## 2020-10-24 ENCOUNTER — Telehealth: Payer: Self-pay | Admitting: Cardiology

## 2020-10-24 ENCOUNTER — Encounter: Payer: Self-pay | Admitting: Cardiology

## 2020-10-24 NOTE — Telephone Encounter (Signed)
Spoke with patient regarding appointment for Cardiac MRI scheduled Monday 12/04/20 at [facility].  Arrival time is 8:30 am at the 1st floor admissions office for a 9:00 am appointe,nt.  Will mail information to patient----she voiced her understanding.

## 2020-12-01 ENCOUNTER — Telehealth (HOSPITAL_COMMUNITY): Payer: Self-pay | Admitting: Emergency Medicine

## 2020-12-01 NOTE — Telephone Encounter (Signed)
Attempted to call patient regarding upcoming cardiac MR appointment. Left message on voicemail with name and callback number Amadea Keagy RN Navigator Cardiac Imaging  Heart and Vascular Services [phone] Office [phone] Cell  

## 2020-12-04 ENCOUNTER — Other Ambulatory Visit: Payer: Self-pay

## 2020-12-04 ENCOUNTER — Ambulatory Visit (HOSPITAL_COMMUNITY)
Admission: RE | Admit: 2020-12-04 | Discharge: 2020-12-04 | Disposition: A | Discharge status: Home / Self Care | Payer: Medicaid Other | Source: Ambulatory Visit | Attending: Cardiology | Admitting: Cardiology

## 2020-12-04 DIAGNOSIS — Z148 Genetic carrier of other disease: Secondary | ICD-10-CM

## 2020-12-04 DIAGNOSIS — R55 Syncope and collapse: Secondary | ICD-10-CM | POA: Diagnosis not present

## 2020-12-04 MED ORDER — GADOBUTROL 1 MMOL/ML IV SOLN
7.0000 mL | Freq: Once | INTRAVENOUS | Status: AC | PRN
  Administered 2020-12-04: 7 mL via INTRAVENOUS

## 2021-01-17 ENCOUNTER — Ambulatory Visit (INDEPENDENT_AMBULATORY_CARE_PROVIDER_SITE_OTHER): Payer: Medicaid Other | Admitting: General Practice

## 2021-01-17 ENCOUNTER — Other Ambulatory Visit: Payer: Self-pay

## 2021-01-17 ENCOUNTER — Other Ambulatory Visit (HOSPITAL_COMMUNITY)
Admission: RE | Admit: 2021-01-17 | Discharge: 2021-01-17 | Disposition: A | Discharge status: Home / Self Care | Payer: Medicaid Other | Source: Ambulatory Visit | Attending: Family Medicine | Admitting: Family Medicine

## 2021-01-17 VITALS — BP 119/70 | HR 67 | Wt 142.6 lb

## 2021-01-17 DIAGNOSIS — Z113 Encounter for screening for infections with a predominantly sexual mode of transmission: Secondary | ICD-10-CM | POA: Insufficient documentation

## 2021-01-17 DIAGNOSIS — Z3202 Encounter for pregnancy test, result negative: Secondary | ICD-10-CM | POA: Diagnosis not present

## 2021-01-17 LAB — POCT PREGNANCY, URINE: Preg Test, Ur: NEGATIVE
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
## 2021-01-17 NOTE — Progress Notes (Signed)
Chart reviewed - agree with CMA/RN documentation.  ° °

## 2021-01-18 ENCOUNTER — Other Ambulatory Visit: Payer: Self-pay | Admitting: Family Medicine

## 2021-01-18 LAB — CERVICOVAGINAL ANCILLARY ONLY
Bacterial Vaginitis (gardnerella): POSITIVE — AB
Candida Glabrata: NEGATIVE
Candida Vaginitis: POSITIVE — AB
Chlamydia: NEGATIVE
Comment: NEGATIVE
Comment: NEGATIVE
Comment: NEGATIVE
Comment: NEGATIVE
Comment: NEGATIVE
Comment: NORMAL
Neisseria Gonorrhea: NEGATIVE
Trichomonas: NEGATIVE

## 2021-01-18 LAB — HEPATITIS B SURFACE ANTIGEN: Hepatitis B Surface Ag: NEGATIVE

## 2021-01-18 LAB — RPR: RPR Ser Ql: NONREACTIVE

## 2021-01-18 LAB — HIV ANTIBODY (ROUTINE TESTING W REFLEX): HIV Screen 4th Generation wRfx: NONREACTIVE

## 2021-01-18 LAB — HEPATITIS C ANTIBODY: Hep C Virus Ab: <0.1 s/co ratio (ref 0.0–0.9)

## 2021-01-18 MED ORDER — FLUCONAZOLE 150 MG PO TABS: 150.0000 mg | ORAL_TABLET | Freq: Once | ORAL | 3 refills | Status: AC

## 2021-01-18 MED ORDER — METRONIDAZOLE 500 MG PO TABS: 500.0000 mg | ORAL_TABLET | Freq: Two times a day (BID) | ORAL | 0 refills | Status: DC

## 2021-03-07 DIAGNOSIS — Z9889 Other specified postprocedural states: Secondary | ICD-10-CM | POA: Diagnosis not present

## 2021-03-07 DIAGNOSIS — M25531 Pain in right wrist: Secondary | ICD-10-CM | POA: Diagnosis not present

## 2021-03-08 DIAGNOSIS — B9689 Other specified bacterial agents as the cause of diseases classified elsewhere: Secondary | ICD-10-CM

## 2021-03-08 MED ORDER — METRONIDAZOLE 0.75 % VA GEL: 1.0000 | Freq: Every day | VAGINAL | 0 refills | Status: DC

## 2021-03-09 ENCOUNTER — Emergency Department (HOSPITAL_COMMUNITY)
Admission: AD | Admit: 2021-03-09 | Discharge: 2021-03-09 | Disposition: A | Discharge status: Home / Self Care | Payer: Medicaid Other | Attending: Obstetrics and Gynecology | Admitting: Obstetrics and Gynecology

## 2021-03-09 ENCOUNTER — Emergency Department (HOSPITAL_COMMUNITY): Payer: Medicaid Other

## 2021-03-09 ENCOUNTER — Telehealth: Payer: Self-pay | Admitting: Surgery

## 2021-03-09 ENCOUNTER — Other Ambulatory Visit: Payer: Self-pay

## 2021-03-09 DIAGNOSIS — S61259A Open bite of unspecified finger without damage to nail, initial encounter: Secondary | ICD-10-CM

## 2021-03-09 DIAGNOSIS — S0003XA Contusion of scalp, initial encounter: Secondary | ICD-10-CM | POA: Insufficient documentation

## 2021-03-09 DIAGNOSIS — E079 Disorder of thyroid, unspecified: Secondary | ICD-10-CM | POA: Insufficient documentation

## 2021-03-09 DIAGNOSIS — S61256A Open bite of right little finger without damage to nail, initial encounter: Secondary | ICD-10-CM | POA: Diagnosis not present

## 2021-03-09 DIAGNOSIS — J45909 Unspecified asthma, uncomplicated: Secondary | ICD-10-CM | POA: Insufficient documentation

## 2021-03-09 DIAGNOSIS — S6991XA Unspecified injury of right wrist, hand and finger(s), initial encounter: Secondary | ICD-10-CM | POA: Diagnosis not present

## 2021-03-09 DIAGNOSIS — R110 Nausea: Secondary | ICD-10-CM | POA: Diagnosis not present

## 2021-03-09 DIAGNOSIS — R2231 Localized swelling, mass and lump, right upper limb: Secondary | ICD-10-CM | POA: Diagnosis not present

## 2021-03-09 DIAGNOSIS — Z23 Encounter for immunization: Secondary | ICD-10-CM | POA: Diagnosis not present

## 2021-03-09 DIAGNOSIS — R000 Tachycardia, unspecified: Secondary | ICD-10-CM | POA: Diagnosis not present

## 2021-03-09 DIAGNOSIS — S0083XA Contusion of other part of head, initial encounter: Secondary | ICD-10-CM | POA: Diagnosis not present

## 2021-03-09 DIAGNOSIS — S3991XA Unspecified injury of abdomen, initial encounter: Secondary | ICD-10-CM | POA: Diagnosis not present

## 2021-03-09 DIAGNOSIS — Y92480 Sidewalk as the place of occurrence of the external cause: Secondary | ICD-10-CM | POA: Insufficient documentation

## 2021-03-09 DIAGNOSIS — W503XXA Accidental bite by another person, initial encounter: Secondary | ICD-10-CM

## 2021-03-09 LAB — POCT PREGNANCY, URINE: Preg Test, Ur: NEGATIVE

## 2021-03-09 LAB — CBC WITH DIFFERENTIAL/PLATELET
Abs Immature Granulocytes: 0.02 10*3/uL (ref 0.00–0.07)
Basophils Absolute: 0 10*3/uL (ref 0.0–0.1)
Basophils Relative: 0 %
Eosinophils Absolute: 0.2 10*3/uL (ref 0.0–0.5)
Eosinophils Relative: 2 %
HCT: 40.9 % (ref 36.0–46.0)
Hemoglobin: 14.3 g/dL (ref 12.0–15.0)
Immature Granulocytes: 0 %
Lymphocytes Relative: 29 %
Lymphs Abs: 3.4 10*3/uL (ref 0.7–4.0)
MCH: 30.8 pg (ref 26.0–34.0)
MCHC: 35 g/dL (ref 30.0–36.0)
MCV: 88 fL (ref 80.0–100.0)
Monocytes Absolute: 1.1 10*3/uL — ABNORMAL HIGH (ref 0.1–1.0)
Monocytes Relative: 10 %
Neutro Abs: 6.9 10*3/uL (ref 1.7–7.7)
Neutrophils Relative %: 59 %
Platelets: 203 10*3/uL (ref 150–400)
RBC: 4.65 MIL/uL (ref 3.87–5.11)
RDW: 12.6 % (ref 11.5–15.5)
WBC: 11.6 10*3/uL — ABNORMAL HIGH (ref 4.0–10.5)
nRBC: 0 % (ref 0.0–0.2)

## 2021-03-09 LAB — RAPID URINE DRUG SCREEN, HOSP PERFORMED
Amphetamines: NOT DETECTED
Barbiturates: NOT DETECTED
Benzodiazepines: NOT DETECTED
Cocaine: NOT DETECTED
Opiates: NOT DETECTED
Tetrahydrocannabinol: POSITIVE — AB

## 2021-03-09 LAB — COMPREHENSIVE METABOLIC PANEL
ALT: 31 U/L (ref 0–44)
AST: 80 U/L — ABNORMAL HIGH (ref 15–41)
Albumin: 4.2 g/dL (ref 3.5–5.0)
Alkaline Phosphatase: 42 U/L (ref 38–126)
Anion gap: 9 (ref 5–15)
BUN: 9 mg/dL (ref 6–20)
CO2: 23 mmol/L (ref 22–32)
Calcium: 9.1 mg/dL (ref 8.9–10.3)
Chloride: 105 mmol/L (ref 98–111)
Creatinine, Ser: 0.88 mg/dL (ref 0.44–1.00)
GFR, Estimated: >60 mL/min (ref >60)
Glucose, Bld: 97 mg/dL (ref 70–99)
Potassium: 4 mmol/L (ref 3.5–5.1)
Sodium: 137 mmol/L (ref 135–145)
Total Bilirubin: 2.6 mg/dL — ABNORMAL HIGH (ref 0.3–1.2)
Total Protein: 6.2 g/dL — ABNORMAL LOW (ref 6.5–8.1)

## 2021-03-09 LAB — HCG, QUANTITATIVE, PREGNANCY: hCG, Beta Chain, Quant, S: <1 m[IU]/mL (ref <5)

## 2021-03-09 LAB — PREGNANCY, URINE: Preg Test, Ur: NEGATIVE

## 2021-03-09 MED ORDER — TRAMADOL HCL 50 MG PO TABS
50.0000 mg | ORAL_TABLET | Freq: Four times a day (QID) | ORAL | 0 refills | Status: DC | PRN
Start: 2021-03-09 — End: 2021-05-23

## 2021-03-09 MED ORDER — AMOXICILLIN-POT CLAVULANATE 875-125 MG PO TABS
1.0000 | ORAL_TABLET | Freq: Once | ORAL | Status: AC
  Administered 2021-03-09: 1 via ORAL
  Filled 2021-03-09: qty 1

## 2021-03-09 MED ORDER — SODIUM CHLORIDE 0.9 % IV BOLUS
500.0000 mL | Freq: Once | INTRAVENOUS | Status: AC
  Administered 2021-03-09: 500 mL via INTRAVENOUS

## 2021-03-09 MED ORDER — IOHEXOL 300 MG/ML  SOLN
100.0000 mL | Freq: Once | INTRAMUSCULAR | Status: AC | PRN
  Administered 2021-03-09: 100 mL via INTRAVENOUS

## 2021-03-09 MED ORDER — AMOXICILLIN-POT CLAVULANATE 875-125 MG PO TABS: 1.0000 | ORAL_TABLET | Freq: Two times a day (BID) | ORAL | 0 refills | Status: AC

## 2021-03-09 MED ORDER — TETANUS-DIPHTH-ACELL PERTUSSIS 5-2.5-18.5 LF-MCG/0.5 IM SUSY
0.5000 mL | PREFILLED_SYRINGE | Freq: Once | INTRAMUSCULAR | Status: AC
  Administered 2021-03-09: 0.5 mL via INTRAMUSCULAR
  Filled 2021-03-09: qty 0.5

## 2021-03-09 MED ORDER — LORAZEPAM 2 MG/ML IJ SOLN
1.0000 mg | Freq: Once | INTRAMUSCULAR | Status: AC
  Administered 2021-03-09: 1 mg via INTRAVENOUS
  Filled 2021-03-09: qty 1

## 2021-03-09 MED ORDER — HYDROMORPHONE HCL 1 MG/ML IJ SOLN
0.5000 mg | Freq: Once | INTRAMUSCULAR | Status: AC
  Administered 2021-03-09: 0.5 mg via INTRAVENOUS
  Filled 2021-03-09: qty 1
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
## 2021-03-09 NOTE — Discharge Instructions (Signed)
As discussed, today's evaluation has been generally reassuring. There is no evidence for injuries to your organs, or brain.  However, you do have substantial skin injuries, that require monitoring.  In particular, monitor the bite wound on your finger.  He will be using oral antibiotics for the next 10 days.  In addition to this, please keep the wound clean, dry, dressed.  Return here for concerning changes in your condition.
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
## 2021-03-09 NOTE — Telephone Encounter (Signed)
ED RNCM received a secure message via VM concerning prescription assistance. Attempted to contact patient at number in chart, however the number listed is a non-working number.

## 2021-03-09 NOTE — ED Notes (Signed)
Pt requested and given a work note

## 2021-03-09 NOTE — ED Notes (Signed)
Pt requested and given a finger splint for 5th digit on right hand.

## 2021-03-12 ENCOUNTER — Telehealth: Payer: Self-pay

## 2021-03-12 NOTE — Telephone Encounter (Signed)
Transition Care Management Unsuccessful Follow-up Telephone Call  Date of discharge and from where:  03/09/2021 from [facility]  Attempts:  1st Attempt  Reason for unsuccessful TCM follow-up call:  Unable to leave message

## 2021-03-13 NOTE — Telephone Encounter (Signed)
Transition Care Management Unsuccessful Follow-up Telephone Call  Date of discharge and from where:  03/09/2021 [facility]  Attempts:  2nd Attempt  Reason for unsuccessful TCM follow-up call:  Unable to leave message

## 2021-03-14 NOTE — Telephone Encounter (Signed)
Transition Care Management Unsuccessful Follow-up Telephone Call  Date of discharge and from where:  03/09/2021 from [facility]  Attempts:  3rd Attempt  Reason for unsuccessful TCM follow-up call:  Unable to reach patient

## 2021-04-02 ENCOUNTER — Encounter: Payer: Self-pay | Admitting: Lactation Services

## 2021-04-02 ENCOUNTER — Other Ambulatory Visit: Payer: Self-pay

## 2021-04-02 ENCOUNTER — Ambulatory Visit (INDEPENDENT_AMBULATORY_CARE_PROVIDER_SITE_OTHER): Payer: Medicaid Other | Admitting: Lactation Services

## 2021-04-02 ENCOUNTER — Other Ambulatory Visit (HOSPITAL_COMMUNITY)
Admission: RE | Admit: 2021-04-02 | Discharge: 2021-04-02 | Disposition: A | Discharge status: Home / Self Care | Payer: Medicaid Other | Source: Ambulatory Visit | Attending: Family Medicine | Admitting: Family Medicine

## 2021-04-02 VITALS — Ht 63.0 in | Wt 136.7 lb

## 2021-04-02 DIAGNOSIS — Z113 Encounter for screening for infections with a predominantly sexual mode of transmission: Secondary | ICD-10-CM | POA: Diagnosis not present

## 2021-04-02 DIAGNOSIS — Z3202 Encounter for pregnancy test, result negative: Secondary | ICD-10-CM | POA: Diagnosis not present

## 2021-04-02 NOTE — Progress Notes (Signed)
Patient in office for STD testing. She has no known exposures. She denies vaginal discharge or symptoms. She has had unprotected sex. She is requesting vaginal swab and blood testing.   Vaginal swab obtained by patient. Patient requested Pregnancy test. UPT negative.    Patient informed that results will return in about 2 days and treatment will be administered as needed. Patient voiced understanding.

## 2021-04-02 NOTE — Progress Notes (Signed)
Patient was assessed and managed by nursing staff during this encounter. I have reviewed the chart and agree with the documentation and plan. I have also made any necessary editorial changes.  Verita Schneiders, MD 04/02/2021 4:01 PM

## 2021-04-03 LAB — HEPATITIS C ANTIBODY: Hep C Virus Ab: <0.1 s/co ratio (ref 0.0–0.9)

## 2021-04-03 LAB — RPR: RPR Ser Ql: NONREACTIVE

## 2021-04-03 LAB — HIV ANTIBODY (ROUTINE TESTING W REFLEX): HIV Screen 4th Generation wRfx: NONREACTIVE

## 2021-04-03 LAB — HEPATITIS B SURFACE ANTIGEN: Hepatitis B Surface Ag: NEGATIVE

## 2021-04-03 LAB — POCT PREGNANCY, URINE: Preg Test, Ur: NEGATIVE

## 2021-04-04 LAB — CERVICOVAGINAL ANCILLARY ONLY
Bacterial Vaginitis (gardnerella): POSITIVE — AB
Candida Glabrata: NEGATIVE
Candida Vaginitis: NEGATIVE
Chlamydia: NEGATIVE
Comment: NEGATIVE
Comment: NEGATIVE
Comment: NEGATIVE
Comment: NEGATIVE
Comment: NEGATIVE
Comment: NORMAL
Neisseria Gonorrhea: NEGATIVE
Trichomonas: POSITIVE — AB

## 2021-04-05 ENCOUNTER — Other Ambulatory Visit: Payer: Self-pay | Admitting: Obstetrics & Gynecology

## 2021-04-05 DIAGNOSIS — N760 Acute vaginitis: Secondary | ICD-10-CM

## 2021-04-05 DIAGNOSIS — A5901 Trichomonal vulvovaginitis: Secondary | ICD-10-CM

## 2021-04-05 MED ORDER — TINIDAZOLE 500 MG PO TABS: 2.0000 g | ORAL_TABLET | Freq: Every day | ORAL | 2 refills | Status: AC

## 2021-05-23 ENCOUNTER — Other Ambulatory Visit: Payer: Self-pay

## 2021-05-23 ENCOUNTER — Inpatient Hospital Stay (HOSPITAL_COMMUNITY): Payer: Medicaid Other

## 2021-05-23 ENCOUNTER — Inpatient Hospital Stay (HOSPITAL_COMMUNITY)
Admission: AD | Admit: 2021-05-23 | Discharge: 2021-05-23 | Disposition: A | Discharge status: Home / Self Care | Payer: Medicaid Other | Attending: Obstetrics and Gynecology | Admitting: Obstetrics and Gynecology

## 2021-05-23 ENCOUNTER — Encounter (HOSPITAL_COMMUNITY): Payer: Self-pay | Admitting: Obstetrics and Gynecology

## 2021-05-23 DIAGNOSIS — Z881 Allergy status to other antibiotic agents status: Secondary | ICD-10-CM | POA: Diagnosis not present

## 2021-05-23 DIAGNOSIS — O26891 Other specified pregnancy related conditions, first trimester: Secondary | ICD-10-CM | POA: Insufficient documentation

## 2021-05-23 DIAGNOSIS — O3680X0 Pregnancy with inconclusive fetal viability, not applicable or unspecified: Secondary | ICD-10-CM | POA: Insufficient documentation

## 2021-05-23 DIAGNOSIS — Z3A00 Weeks of gestation of pregnancy not specified: Secondary | ICD-10-CM | POA: Insufficient documentation

## 2021-05-23 DIAGNOSIS — O26851 Spotting complicating pregnancy, first trimester: Secondary | ICD-10-CM | POA: Diagnosis not present

## 2021-05-23 DIAGNOSIS — O26859 Spotting complicating pregnancy, unspecified trimester: Secondary | ICD-10-CM

## 2021-05-23 DIAGNOSIS — R109 Unspecified abdominal pain: Secondary | ICD-10-CM

## 2021-05-23 LAB — COMPREHENSIVE METABOLIC PANEL
ALT: 19 U/L (ref 0–44)
AST: 21 U/L (ref 15–41)
Albumin: 3.8 g/dL (ref 3.5–5.0)
Alkaline Phosphatase: 35 U/L — ABNORMAL LOW (ref 38–126)
Anion gap: 7 (ref 5–15)
BUN: 11 mg/dL (ref 6–20)
CO2: 25 mmol/L (ref 22–32)
Calcium: 8.8 mg/dL — ABNORMAL LOW (ref 8.9–10.3)
Chloride: 103 mmol/L (ref 98–111)
Creatinine, Ser: 0.85 mg/dL (ref 0.44–1.00)
GFR, Estimated: >60 mL/min (ref >60)
Glucose, Bld: 95 mg/dL (ref 70–99)
Potassium: 3.9 mmol/L (ref 3.5–5.1)
Sodium: 135 mmol/L (ref 135–145)
Total Bilirubin: 0.7 mg/dL (ref 0.3–1.2)
Total Protein: 6.4 g/dL — ABNORMAL LOW (ref 6.5–8.1)

## 2021-05-23 LAB — URINALYSIS, ROUTINE W REFLEX MICROSCOPIC
Bilirubin Urine: NEGATIVE
Glucose, UA: NEGATIVE mg/dL
Hgb urine dipstick: NEGATIVE
Ketones, ur: NEGATIVE mg/dL
Leukocytes,Ua: NEGATIVE
Nitrite: NEGATIVE
Protein, ur: NEGATIVE mg/dL
Specific Gravity, Urine: 1.018 (ref 1.005–1.030)
pH: 6 (ref 5.0–8.0)

## 2021-05-23 LAB — CBC
HCT: 41.3 % (ref 36.0–46.0)
Hemoglobin: 13.9 g/dL (ref 12.0–15.0)
MCH: 30.9 pg (ref 26.0–34.0)
MCHC: 33.7 g/dL (ref 30.0–36.0)
MCV: 91.8 fL (ref 80.0–100.0)
Platelets: 215 10*3/uL (ref 150–400)
RBC: 4.5 MIL/uL (ref 3.87–5.11)
RDW: 13 % (ref 11.5–15.5)
WBC: 8.5 10*3/uL (ref 4.0–10.5)
nRBC: 0 % (ref 0.0–0.2)

## 2021-05-23 LAB — WET PREP, GENITAL
Clue Cells Wet Prep HPF POC: NONE SEEN
Sperm: NONE SEEN
Trich, Wet Prep: NONE SEEN
Yeast Wet Prep HPF POC: NONE SEEN

## 2021-05-23 LAB — POCT PREGNANCY, URINE: Preg Test, Ur: POSITIVE — AB

## 2021-05-23 LAB — HCG, QUANTITATIVE, PREGNANCY: hCG, Beta Chain, Quant, S: 504 m[IU]/mL — ABNORMAL HIGH (ref <5)

## 2021-05-23 MED ORDER — PREPLUS 27-1 MG PO TABS: 1.0000 | ORAL_TABLET | Freq: Every day | ORAL | 13 refills | Status: DC
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
## 2021-05-23 NOTE — Discharge Instructions (Signed)
Abdominal Pain During Pregnancy Belly (abdominal) pain is common during pregnancy. There are many possible causes. Some causes are more serious than others. Sometimes the cause is not known. Always tell your doctor if you have belly pain. Follow these instructions at home:  Do not have sex or put anything in your vagina until your pain goes away completely.  Get plenty of rest until your pain gets better.  Drink enough fluid to keep your pee (urine) pale yellow.  Take over-the-counter and prescription medicines only as told by your doctor.  Keep all follow-up visits.   Contact a doctor if:  You keep having pain after resting.  Your pain gets worse after resting.  You have lower belly pain that: ? Comes and goes at regular times. ? Spreads to your back. ? Feels like menstrual cramps.  You have pain or burning when you pee (urinate). Get help right away if:  You have a fever or chills.  You feel like it is hard to breathe.  You have bleeding from your vagina.  You are leaking fluid or tissue from your vagina.  You vomit for more than 24 hours.  You have watery poop (diarrhea) for more than 24 hours.  Your baby is moving less than usual.  You feel very weak or faint.  You have very bad pain in your upper belly. Summary  Belly pain is common during pregnancy. There are many possible causes.  If you have belly pain during pregnancy, tell your doctor right away.  Keep all follow-up visits. This information is not intended to replace advice given to you by your health care provider. Make sure you discuss any questions you have with your health care provider. Document Revised: 08/22/2020 Document Reviewed: 08/22/2020 Elsevier Patient Education  2021 Elsevier Inc.   Human Chorionic Gonadotropin Test Why am I having this test? A human chorionic gonadotropin (hCG) test is done to determine whether you are pregnant. It can also be used:  To diagnose an abnormal  pregnancy.  To determine whether you have had a miscarriage or are at risk of one. What is being tested? This test checks the level of the human chorionic gonadotropin (hCG) hormone in the blood. This hormone is produced during pregnancy by the cells that form the placenta. The placenta is the organ that grows inside your uterus to nourish a developing baby. When you are pregnant, hCG can be detected in your blood or urine 7 to 8 days before your missed period. The amount of hCG continues to increase for the first 8-10 weeks of pregnancy. The presence of hCG in your blood can be measured with different types of tests. You may have:  A urine test. ? A urine test only shows whether there is hCG in your urine. It does not measure how much.  A qualitative blood test. ? This blood test only shows whether there is hCG in your blood. It does not measure how much.  A quantitative blood test. ? This type of blood test measures the amount of hCG in your blood. ? You may have this test to:  Diagnose an abnormal pregnancy.  Check whether you have had a miscarriage.  Determine whether you are at risk of a miscarriage.  Determine if treatment of an ectopic pregnancy is successful. What kind of sample is taken? Two kinds of samples may be collected to test for the hCG hormone.  Blood. It is usually collected by inserting a needle into a blood vessel.  Urine. It is usually collected by urinating into a germ-free (sterile) specimen cup.      How do I prepare for this test? No preparation is needed for a blood test.  Some preparation is needed for a urine test:  For best results, collect the sample the first time you urinate in the morning. That is when the concentration of hCG is highest.  Do not drink too much fluid. Drink as you normally would, or as directed by your health care provider. Tell a health care provider about:  All medicines you are taking, including vitamins, herbs, eye drops,  creams, and over-the-counter medicines.  Any blood in your urine. This may interfere with the result. How are the results reported? Depending on the type of test that you have, your test results may be reported as values. Your health care provider will compare your results to normal ranges that were established after testing a large group of people (reference ranges). Reference ranges may vary among labs and hospitals. For this test, common reference ranges that show absence of pregnancy are:  Quantitative hCG blood levels: less than 5 IU/L. Other results will be reported as either positive or negative. For this test, normal results (meaning the absence of pregnancy) are:  Negative for hCG in the urine test.  Negative for hCG in the qualitative blood test. What do the results mean? Urine and qualitative blood test  A negative result could mean: ? That you are not pregnant. ? That the test was done too early in your pregnancy to detect hCG in your blood or urine. If you still have other signs of pregnancy, the test will be repeated.  A positive result means: ? That you are most likely pregnant. Your health care provider may confirm your pregnancy with an ultrasound of your uterus, if needed. Quantitative blood test Results of the quantitative hCG blood test will be reported as values. These values will be interpreted by your health care provider along with your medical history and symptoms you are experiencing. Results outside of expected ranges could mean that:  You are pregnant with twins.  You have abnormal growths in your uterus.  You have an ectopic pregnancy.  You may be experiencing a miscarriage. Talk with your health care provider about what your results mean. Questions to ask your health care provider Ask your health care provider, or the department that is doing the test:  When will my results be ready?  How will I get my results?  What are my treatment  options?  What other tests do I need?  What are my next steps? Summary  A human chorionic gonadotropin (hCG) test is done to determine whether you are pregnant.  When you are pregnant, hCG can be detected in your blood or urine 7 to 8 days before your missed period. HCG levels continue to go up for the first 8-10 weeks of pregnancy.  Your hCG level can be measured with different types of tests. You may have a urine test, a qualitative blood test, or a quantitative blood test.  Talk with your health care provider about what your test results mean. This information is not intended to replace advice given to you by your health care provider. Make sure you discuss any questions you have with your health care provider. Document Revised: 09/11/2020 Document Reviewed: 09/11/2020 Elsevier Patient Education  [place] Medications in Pregnancy   Acne: Benzoyl Peroxide Salicylic Acid  Backache/Headache: Tylenol: 2 regular strength  every 4 hours OR              2 Extra strength every 6 hours  Colds/Coughs/Allergies: Benadryl (alcohol free) 25 mg every 6 hours as needed Breath right strips Claritin Cepacol throat lozenges Chloraseptic throat spray Cold-Eeze- up to three times per day Cough drops, alcohol free Flonase (by prescription only) Guaifenesin Mucinex Robitussin DM (plain only, alcohol free) Saline nasal spray/drops Sudafed (pseudoephedrine) & Actifed ** use only after [redacted] weeks gestation and if you do not have high blood pressure Tylenol Vicks Vaporub Zinc lozenges Zyrtec   Constipation: Colace Ducolax suppositories Fleet enema Glycerin suppositories Metamucil Milk of magnesia Miralax Senokot Smooth move tea  Diarrhea: Kaopectate Imodium A-D  *NO pepto Bismol  Hemorrhoids: Anusol Anusol HC Preparation H Tucks  Indigestion: Tums Maalox Mylanta Zantac  Pepcid  Insomnia: Benadryl (alcohol free) $RemoveBefore'25mg'AcvVipYwYDwXs$  every 6 hours as needed Tylenol  PM Unisom, no Gelcaps  Leg Cramps: Tums MagGel  Nausea/Vomiting:  Bonine Dramamine Emetrol Ginger extract Sea bands Meclizine  Nausea medication to take during pregnancy:  Unisom (doxylamine succinate 25 mg tablets) Take one tablet daily at bedtime. If symptoms are not adequately controlled, the dose can be increased to a maximum recommended dose of two tablets daily (1/2 tablet in the morning, 1/2 tablet mid-afternoon and one at bedtime). Vitamin B6 $RemoveBe'100mg'xgDKdRXIn$  tablets. Take one tablet twice a day (up to 200 mg per day).  Skin Rashes: Aveeno products Benadryl cream or $Remove'25mg'TLLWFCA$  every 6 hours as needed Calamine Lotion 1% cortisone cream  Yeast infection: Gyne-lotrimin 7 Monistat 7   **If taking multiple medications, please check labels to avoid duplicating the same active ingredients **take medication as directed on the label ** Do not exceed 4000 mg of tylenol in 24 hours **Do not take medications that contain aspirin or ibuprofen

## 2021-05-23 NOTE — MAU Provider Note (Signed)
History     CSN: [id number]  Arrival date and time: 05/23/21 1255   Event Date/Time   First Provider Initiated Contact with Patient 05/23/21 1349      Chief Complaint  Patient presents with  . Cramping   HPI Angela Branch is a 25 y.o. [phone] in early pregnancy who presents to MAU with chief complaint of suprapubic cramping. This is a new problem, onset last week. Pain score 4/10. Pain does not radiate. She denies aggravating or alleviating factors.   Patient also c/o dark brown vaginal discharge, onset last week and resolved yesterday without intervention. Patient states her discharge was scant to small "I just thought it was my period coming on". She denies bright red frank bleeding, heavy bleeding, acute symptoms including weakness, dizziness, syncope.  Most recent sexual intercourse was this morning. Neither pain nor bleeding triggered by intercourse.   OB History    Gravida  5   Para  3   Term  3   Preterm  0   AB  1   Living  3     SAB  1   IAB  0   Ectopic  0   Multiple  0   Live Births  3           Past Medical History:  Diagnosis Date  . Adjustment disorder with mixed anxiety and depressed mood 04/29/2016  . Anemia   . Anxiety   . Asthma   . Asthma 06/12/2012  . 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 Eczema 10/04/2014  . Faintness 10/05/2015  . Family dynamics problem 03/25/2013  . Fetal tachycardia affecting management of mother 09/21/2018  . GBS (group B Streptococcus carrier), +RV culture, currently pregnant 11/14/2015  . GERD (gastroesophageal reflux disease) 09/21/2015  . H/O scabies 11/14/2015   Treated with permetherin   . 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 Loss of weight 03/25/2013  . Low back pain 09/20/2013  . Menorrhagia 09/28/2013  . Migraine headache 02/23/2014  . Sleep apnea 04/14/2014  . Substance abuse ([place]) 03/24/2015   + cannabis   . Supervision of low-risk pregnancy 06/28/2019     Nursing Staff Provider Office Location  [facility] Dating   early u/s Language  English Anatomy US   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  Flu Vaccine   Genetic Screen  NIPS: low risk female      TDaP vaccine    Hgb A1C or  GTT  Third trimester  Rhogam  n/a   LAB RESULTS  Feeding Plan Breast/Bottle Blood Type A/Positive/-- (07/16 1206)  Contraception undecided Antibody Negative (07/16  . Thyroid disease     Past Surgical History:  Procedure Laterality Date  . TOOTH EXTRACTION    . WRIST SURGERY  08/30/2020    Family History  Problem Relation Age of Onset  . Asthma Father   . Diabetes Maternal Grandmother   . Hyperlipidemia Paternal Grandfather   . Heart disease Paternal Grandfather   . Cancer - Colon Maternal Grandfather   . Mental illness Neg Hx   . Birth defects Neg Hx   . Kidney disease Neg Hx   . Hypertension Neg Hx     Social History   Tobacco Use  . Smoking status: Never Smoker  . Smokeless tobacco: Never Used  Vaping Use  . Vaping Use: Never used  Substance Use Topics  . Alcohol use: No    Alcohol/week: 0.0 standard  drinks  . Drug use: No    Allergies:  Allergies  Allergen Reactions  . Azithromycin Diarrhea    Diarrhea, nausea/vomiting, IV burns vein Diarrhea, nausea/vomiting, IV burns vein    Medications Prior to Admission  Medication Sig Dispense Refill Last Dose  . traMADol (ULTRAM) 50 MG tablet Take 1 tablet (50 mg total) by mouth every 6 (six) hours as needed. 15 tablet 0     Review of Systems  Gastrointestinal: Positive for abdominal pain.  Genitourinary: Negative for vaginal bleeding.  All other systems reviewed and are negative.  Physical Exam   Blood pressure 108/60, pulse 68, temperature 97.8 F (36.6 C), temperature source Oral, resp. rate 19, height $RemoveBe'5\' 3"'hFEjsDYoO$  (1.6 m), weight 62.1 kg, SpO2 99 %, currently breastfeeding.  Physical Exam Vitals and nursing note reviewed. Exam conducted with a chaperone present.  Constitutional:       Appearance: Normal appearance. She is not ill-appearing.  Cardiovascular:     Rate and Rhythm: Normal rate.     Pulses: Normal pulses.     Heart sounds: Normal heart sounds.  Pulmonary:     Effort: Pulmonary effort is normal.     Breath sounds: Normal breath sounds.  Abdominal:     General: Abdomen is flat. There is no distension.     Tenderness: There is no right CVA tenderness or left CVA tenderness.  Genitourinary:    Comments: Pelvic deferred due to resolution of bleeding Skin:    Capillary Refill: Capillary refill takes less than 2 seconds.  Neurological:     Mental Status: She is alert and oriented to person, place, and time.  Psychiatric:        Mood and Affect: Mood normal.        Behavior: Behavior normal.        Thought Content: Thought content normal.        Judgment: Judgment normal.     MAU Course  Procedures  Orders Placed This Encounter  Procedures  . Wet prep, genital  . US OB LESS THAN 14 WEEKS WITH OB TRANSVAGINAL  . Urinalysis, Routine w reflex microscopic Urine, Clean Catch  . CBC  . Comprehensive metabolic panel  . hCG, quantitative, pregnancy  . Diet NPO time specified  . Nursing communication  . Pregnancy, urine POC  . Discharge patient   Patient Vitals for the past 24 hrs:  BP Temp Temp src Pulse Resp SpO2 Height Weight  05/23/21 1600 110/67 98.4 F (36.9 C) Oral 64 20 99 % -- --  05/23/21 1346 108/60 -- -- 68 19 -- -- --  05/23/21 1338 105/61 97.8 F (36.6 C) Oral 79 18 99 % -- --  05/23/21 1327 -- -- -- -- -- -- $Rem'5\' 3"'BUoe$  (1.6 m) 62.1 kg   Results for orders placed or performed during the hospital encounter of 05/23/21 (from the past 24 hour(s))  Pregnancy, urine POC     Status: Abnormal   Collection Time: 05/23/21  1:25 PM  Result Value Ref Range   Preg Test, Ur POSITIVE (A) NEGATIVE  Urinalysis, Routine w reflex microscopic     Status: None   Collection Time: 05/23/21  1:38 PM  Result Value Ref Range   Color, Urine YELLOW YELLOW    APPearance CLEAR CLEAR   Specific Gravity, Urine 1.018 1.005 - 1.030   pH 6.0 5.0 - 8.0   Glucose, UA NEGATIVE NEGATIVE mg/dL   Hgb urine dipstick NEGATIVE NEGATIVE   Bilirubin Urine NEGATIVE NEGATIVE   Ketones,  ur NEGATIVE NEGATIVE mg/dL   Protein, ur NEGATIVE NEGATIVE mg/dL   Nitrite NEGATIVE NEGATIVE   Leukocytes,Ua NEGATIVE NEGATIVE  CBC     Status: None   Collection Time: 05/23/21  1:59 PM  Result Value Ref Range   WBC 8.5 4.0 - 10.5 K/uL   RBC 4.50 3.87 - 5.11 MIL/uL   Hemoglobin 13.9 12.0 - 15.0 g/dL   HCT 41.3 36.0 - 46.0 %   MCV 91.8 80.0 - 100.0 fL   MCH 30.9 26.0 - 34.0 pg   MCHC 33.7 30.0 - 36.0 g/dL   RDW 13.0 11.5 - 15.5 %   Platelets 215 150 - 400 K/uL   nRBC 0.0 0.0 - 0.2 %  Comprehensive metabolic panel     Status: Abnormal   Collection Time: 05/23/21  1:59 PM  Result Value Ref Range   Sodium 135 135 - 145 mmol/L   Potassium 3.9 3.5 - 5.1 mmol/L   Chloride 103 98 - 111 mmol/L   CO2 25 22 - 32 mmol/L   Glucose, Bld 95 70 - 99 mg/dL   BUN 11 6 - 20 mg/dL   Creatinine, Ser 0.85 0.44 - 1.00 mg/dL   Calcium 8.8 (L) 8.9 - 10.3 mg/dL   Total Protein 6.4 (L) 6.5 - 8.1 g/dL   Albumin 3.8 3.5 - 5.0 g/dL   AST 21 15 - 41 U/L   ALT 19 0 - 44 U/L   Alkaline Phosphatase 35 (L) 38 - 126 U/L   Total Bilirubin 0.7 0.3 - 1.2 mg/dL   GFR, Estimated >60 >60 mL/min   Anion gap 7 5 - 15  hCG, quantitative, pregnancy     Status: Abnormal   Collection Time: 05/23/21  1:59 PM  Result Value Ref Range   hCG, Beta Chain, Quant, S 504 (H) <5 mIU/mL  Wet prep, genital     Status: Abnormal   Collection Time: 05/23/21  2:29 PM   Specimen: PATH Cytology Cervicovaginal Ancillary Only  Result Value Ref Range   Yeast Wet Prep HPF POC NONE SEEN NONE SEEN   Trich, Wet Prep NONE SEEN NONE SEEN   Clue Cells Wet Prep HPF POC NONE SEEN NONE SEEN   WBC, Wet Prep HPF POC FEW (A) NONE SEEN   Sperm NONE SEEN    US OB LESS THAN 14 WEEKS WITH OB TRANSVAGINAL  Result Date:  05/23/2021 CLINICAL DATA:  Spotting and cramping in first trimester of pregnancy, uncertain LMP above was at end of April EXAM: OBSTETRIC <14 WK [name] AND TRANSVAGINAL OB US TECHNIQUE: Both transabdominal and transvaginal ultrasound examinations were performed for complete evaluation of the gestation as well as the maternal uterus, adnexal regions, and pelvic cul-de-sac. Transvaginal technique was performed to assess early pregnancy. COMPARISON:  None FINDINGS: Intrauterine gestational sac: Absent Yolk sac:  N/A Embryo:  N/A Cardiac Activity: N/A Heart Rate: N/A  bpm MSD:   mm    w     d CRL:    mm    w    d                  [name] EDC: Subchorionic hemorrhage:  N/A Maternal uterus/adnexae: Maternal uterus unremarkable without evidence of mass or gestational sac. Endometrial complex 14 mm thick, homogeneous, without fluid or mass. RIGHT ovary measures 2.2 x 3.5 x 2.6 cm and contains a corpus luteum. LEFT ovary normal size and morphology, 1.6 x 2.8 x 1.8 cm. Trace free pelvic fluid. No adnexal masses. IMPRESSION:  No intrauterine gestation identified. Findings are consistent with pregnancy of unknown location. Differential diagnosis includes early intrauterine pregnancy too early to visualize, spontaneous abortion, and ectopic pregnancy. Serial quantitative beta HCG and or follow-up ultrasound recommended to definitively exclude ectopic pregnancy. Electronically Signed   By: Lavonia Dana M.D.   On: 05/23/2021 15:19   Meds ordered this encounter  Medications  . Prenatal Vit-Fe Fumarate-FA (PREPLUS) 27-1 MG TABS    Sig: Take 1 tablet by mouth daily.    Dispense:  30 tablet    Refill:  13    Order Specific Question:   Supervising Provider    Answer:   Sloan Leiter [[id number]]    Assessment and Plan  --25 y.o. [phone] with pregnancy of unknown location --Quant hCG 500 --Hx vaginal spotting this pregnancy, blood type A POS --Discharge home in stable condition with ectopic precautions  F/U: --Needs stat Quant  Friday 06/03 after 2pm. Unable to accomplish in office. Patient to return to [place], CNM 05/23/2021, 5:08 PM

## 2021-05-23 NOTE — Progress Notes (Signed)
Pt given instructions on how to self swab for GC/Chlamydia & wet prep cultures.  Cultures obtained & sent to lab.

## 2021-05-24 LAB — GC/CHLAMYDIA PROBE AMP (~~LOC~~) NOT AT ARMC
Chlamydia: NEGATIVE
Comment: NEGATIVE
Comment: NORMAL
Neisseria Gonorrhea: NEGATIVE

## 2021-05-27 ENCOUNTER — Encounter (HOSPITAL_COMMUNITY): Payer: Self-pay | Admitting: Obstetrics and Gynecology

## 2021-05-27 ENCOUNTER — Other Ambulatory Visit: Payer: Self-pay | Admitting: Advanced Practice Midwife

## 2021-05-27 ENCOUNTER — Other Ambulatory Visit: Payer: Self-pay

## 2021-05-27 ENCOUNTER — Inpatient Hospital Stay (HOSPITAL_COMMUNITY)
Admission: AD | Admit: 2021-05-27 | Discharge: 2021-05-27 | Disposition: A | Discharge status: Home / Self Care | Payer: Medicaid Other | Attending: Obstetrics and Gynecology | Admitting: Obstetrics and Gynecology

## 2021-05-27 DIAGNOSIS — O209 Hemorrhage in early pregnancy, unspecified: Secondary | ICD-10-CM

## 2021-05-27 DIAGNOSIS — Z3A00 Weeks of gestation of pregnancy not specified: Secondary | ICD-10-CM | POA: Diagnosis not present

## 2021-05-27 DIAGNOSIS — O3680X0 Pregnancy with inconclusive fetal viability, not applicable or unspecified: Secondary | ICD-10-CM | POA: Insufficient documentation

## 2021-05-27 LAB — HCG, QUANTITATIVE, PREGNANCY: hCG, Beta Chain, Quant, S: 2168 m[IU]/mL — ABNORMAL HIGH (ref <5)

## 2021-05-27 NOTE — MAU Provider Note (Signed)
History    Event Date/Time   First Provider Initiated Contact with Patient 05/27/21 1845      Chief Complaint:  Follow-up   Angela Branch is  25 y.o. [id number] No LMP recorded. Patient is pregnant.. Patient is here for follow up of quantitative HCG and ongoing surveillance of pregnancy status.   She is Unknown weeks gestation.    Since her last visit, the patient is without new complaint.     ROS Abdominal Pain: Denies Vaginal bleeding: none now.   Passage of clots or tissue: Denies Dizziness: Denies  A POS  Her previous Quantitative HCG values are: Results for Angela Branch, Angela Branch (MRN [id number]) as of 05/27/2021 20:02  Ref. Range 05/23/2021 13:59  HCG, Beta Chain, Quant, S Latest Ref Range: <5 mIU/mL 504 (H)    Physical Exam   Patient Vitals for the past 24 hrs:  BP Temp Temp src Pulse Resp SpO2  05/27/21 1810 (!) 100/56 98.3 F (36.8 C) Oral 82 16 99 %   Constitutional: Well-nourished female in no apparent distress. No pallor Neuro: Alert and oriented 4 Cardiovascular: Normal rate Respiratory: Normal effort and rate Abdomen: Deferred Gynecological Exam: examination not indicated  Labs: Results for orders placed or performed during the hospital encounter of 05/27/21 (from the past 24 hour(s))  hCG, quantitative, pregnancy   Collection Time: 05/27/21  6:39 PM  Result Value Ref Range   hCG, Beta Chain, Quant, S 2,168 (H) <5 mIU/mL    Ultrasound Studies:   US OB LESS THAN 14 WEEKS WITH OB TRANSVAGINAL  Result Date: 05/23/2021 CLINICAL DATA:  Spotting and cramping in first trimester of pregnancy, uncertain LMP above was at end of April EXAM: OBSTETRIC <14 WK [name] AND TRANSVAGINAL OB US TECHNIQUE: Both transabdominal and transvaginal ultrasound examinations were performed for complete evaluation of the gestation as well as the maternal uterus, adnexal regions, and pelvic cul-de-sac. Transvaginal technique was performed to assess early pregnancy. COMPARISON:  None FINDINGS:  Intrauterine gestational sac: Absent Yolk sac:  N/A Embryo:  N/A Cardiac Activity: N/A Heart Rate: N/A  bpm MSD:   mm    w     d CRL:    mm    w    d                  [name] EDC: Subchorionic hemorrhage:  N/A Maternal uterus/adnexae: Maternal uterus unremarkable without evidence of mass or gestational sac. Endometrial complex 14 mm thick, homogeneous, without fluid or mass. RIGHT ovary measures 2.2 x 3.5 x 2.6 cm and contains a corpus luteum. LEFT ovary normal size and morphology, 1.6 x 2.8 x 1.8 cm. Trace free pelvic fluid. No adnexal masses. IMPRESSION: No intrauterine gestation identified. Findings are consistent with pregnancy of unknown location. Differential diagnosis includes early intrauterine pregnancy too early to visualize, spontaneous abortion, and ectopic pregnancy. Serial quantitative beta HCG and or follow-up ultrasound recommended to definitively exclude ectopic pregnancy. Electronically Signed   By: Ulyses Southward M.D.   On: 05/23/2021 15:19    MAU course/MDM: Quantitative hCG ordered  Pain and bleeding in early pregnancy with normal rise in Quant and hemodynamically stable.  Assessment: Unknown weeks gestation w/ Normal rise in Quant  Plan: Discharge home in stable condition. Message sent to [facility] to schedule [name] in 1 week Ectopic and SAB precautions  Allergies as of 05/27/2021      Reactions   Azithromycin Diarrhea   Diarrhea, nausea/vomiting, IV burns vein Diarrhea, nausea/vomiting, IV burns vein  Medication List    TAKE these medications   PrePLUS 27-1 MG Tabs Take 1 tablet by mouth daily.       Follow-up [facility] Follow up in 1 week(s).   Specialty: Obstetrics and Gynecology Why: Repeat Ultrasound Contact information: [place] [id number] [phone]       [facility] Follow up.   Specialty: Obstetrics and Gynecology Why: as needed in  pregnancy emergencies Contact information: [address] [id number] [place] [address] [phone]               Tamala Julian [place], [place] 05/27/2021, 8:19 PM  2/3

## 2021-05-27 NOTE — Discharge Instructions (Signed)
Vaginal Bleeding During Pregnancy, First Trimester A small amount of bleeding from the vagina is common during early pregnancy. This kind of bleeding is also called spotting. Sometimes the bleeding is normal and does not cause problems. At other times, though, bleeding may be a sign of something serious. Normal bleeding in pregnancy can happen:  When the fertilized egg attaches itself to your womb.  When blood vessels change because of the pregnancy.  When you have pelvic exams.  When you have sex. Abnormal bleeding can happen:  When you have an infection.  When you have growths in your womb. The growths are called polyps.  If you are having a miscarriage or at risk of having one.  If you have other problems in your pregnancy. Tell your doctor right away about any bleeding from your vagina. Follow these instructions at home: Watch your bleeding  Watch your condition for any changes. Let your doctor know if you are worried about something.  Try to know what causes your bleeding. Ask yourself these questions: ? Does the bleeding start on its own? ? Does the bleeding start after something is done, such as sex or a pelvic exam?  Use a diary to write the things you see about your bleeding. Write in your diary: ? If the bleeding flows freely without stopping, or if it starts and stops, and then starts again. ? If the bleeding is heavy or light. ? How many pads you use in a day and how much blood is in them.  Tell your doctor if you pass tissue. He or she may want to see it.   Activity  Follow your doctor's instructions about how active you can be. Ask what activities are safe for you.  Do not have sex or orgasms until your doctor says that this is safe.  If needed, make plans for someone to help with your normal activities. General instructions  Take over-the-counter and prescription medicines only as told by your doctor.  Do not take aspirin because it can cause  bleeding.  Do not use tampons.  Do not douche.  Keep all follow-up visits. Contact a doctor if:  You have vaginal bleeding at any time while you are pregnant.  You have cramps.  You have a fever or chills. Get help right away if:  You have very bad cramps in your back or belly (abdomen).  You pass large clots or a lot of tissue from your vagina.  Your bleeding gets worse.  You feel light-headed.  You feel weak.  You pass out (faint).  You have chills.  You are leaking fluid from your vagina.  You have a gush of fluid from your vagina. Summary  Sometimes vaginal bleeding during pregnancy is normal and does not cause problems. At other times, bleeding may be a sign of something serious.  Tell your doctor right away about any bleeding from your vagina.  Follow your doctor's instructions about how active you can be. You may need someone to help you with your normal activities.  Keep all follow-up visits. This information is not intended to replace advice given to you by your health care provider. Make sure you discuss any questions you have with your health care provider. Document Revised: 08/31/2020 Document Reviewed: 08/31/2020 Elsevier Patient Education  2021 [employer].

## 2021-05-27 NOTE — Progress Notes (Signed)
Please schedule pt for F/U US for Pregnancy of unknown anatomic location in 1 week.

## 2021-05-27 NOTE — MAU Note (Signed)
Pt reports to mau for follow up lab work. Denies vag bleeding or pain.

## 2021-05-30 ENCOUNTER — Ambulatory Visit
Admission: RE | Admit: 2021-05-30 | Discharge: 2021-05-30 | Disposition: A | Discharge status: Home / Self Care | Payer: Medicaid Other | Source: Ambulatory Visit | Attending: Advanced Practice Midwife | Admitting: Advanced Practice Midwife

## 2021-05-30 ENCOUNTER — Ambulatory Visit (INDEPENDENT_AMBULATORY_CARE_PROVIDER_SITE_OTHER): Payer: Medicaid Other

## 2021-05-30 ENCOUNTER — Other Ambulatory Visit: Payer: Self-pay

## 2021-05-30 VITALS — BP 120/69 | HR 69 | Wt 136.2 lb

## 2021-05-30 DIAGNOSIS — O3680X0 Pregnancy with inconclusive fetal viability, not applicable or unspecified: Secondary | ICD-10-CM

## 2021-05-30 DIAGNOSIS — Z3A01 Less than 8 weeks gestation of pregnancy: Secondary | ICD-10-CM | POA: Diagnosis not present

## 2021-05-30 DIAGNOSIS — O209 Hemorrhage in early pregnancy, unspecified: Secondary | ICD-10-CM | POA: Diagnosis not present

## 2021-05-30 DIAGNOSIS — Z712 Person consulting for explanation of examination or test findings: Secondary | ICD-10-CM

## 2021-05-30 NOTE — Progress Notes (Signed)
Patient here for results following ultrasound to establish location of pregnancy and fetal viability. Results reviewed with Kennon Rounds, MD who finds gestational sac and yolk sac within uterus. States this is expected progression of pregnancy in 1 week. Recommends pt return for [name] in 10 days. Results and provider recommendation given to pt. [name] scheduled for 06/11/21 at 3 PM.  Apolonio Schneiders RN 05/30/21

## 2021-05-31 NOTE — Progress Notes (Signed)
Patient seen and assessed by nursing staff.  Agree with documentation and plan.  

## 2021-06-10 ENCOUNTER — Other Ambulatory Visit: Payer: Self-pay

## 2021-06-10 ENCOUNTER — Encounter (HOSPITAL_COMMUNITY): Payer: Self-pay | Admitting: Family Medicine

## 2021-06-10 ENCOUNTER — Inpatient Hospital Stay (HOSPITAL_COMMUNITY)
Admission: AD | Admit: 2021-06-10 | Discharge: 2021-06-10 | Disposition: A | Discharge status: Home / Self Care | Payer: Medicaid Other | Attending: Family Medicine | Admitting: Family Medicine

## 2021-06-10 ENCOUNTER — Inpatient Hospital Stay (HOSPITAL_COMMUNITY): Payer: Medicaid Other

## 2021-06-10 DIAGNOSIS — O3680X0 Pregnancy with inconclusive fetal viability, not applicable or unspecified: Secondary | ICD-10-CM | POA: Diagnosis not present

## 2021-06-10 DIAGNOSIS — R1084 Generalized abdominal pain: Secondary | ICD-10-CM | POA: Diagnosis not present

## 2021-06-10 DIAGNOSIS — O210 Mild hyperemesis gravidarum: Secondary | ICD-10-CM | POA: Insufficient documentation

## 2021-06-10 DIAGNOSIS — O26891 Other specified pregnancy related conditions, first trimester: Secondary | ICD-10-CM | POA: Insufficient documentation

## 2021-06-10 DIAGNOSIS — R109 Unspecified abdominal pain: Secondary | ICD-10-CM | POA: Diagnosis not present

## 2021-06-10 DIAGNOSIS — Z3A01 Less than 8 weeks gestation of pregnancy: Secondary | ICD-10-CM | POA: Insufficient documentation

## 2021-06-10 LAB — COMPREHENSIVE METABOLIC PANEL
ALT: 30 U/L (ref 0–44)
AST: 32 U/L (ref 15–41)
Albumin: 4.5 g/dL (ref 3.5–5.0)
Alkaline Phosphatase: 42 U/L (ref 38–126)
Anion gap: 8 (ref 5–15)
BUN: 10 mg/dL (ref 6–20)
CO2: 26 mmol/L (ref 22–32)
Calcium: 9.3 mg/dL (ref 8.9–10.3)
Chloride: 103 mmol/L (ref 98–111)
Creatinine, Ser: 0.73 mg/dL (ref 0.44–1.00)
GFR, Estimated: >60 mL/min (ref >60)
Glucose, Bld: 97 mg/dL (ref 70–99)
Potassium: 3.9 mmol/L (ref 3.5–5.1)
Sodium: 137 mmol/L (ref 135–145)
Total Bilirubin: 1.5 mg/dL — ABNORMAL HIGH (ref 0.3–1.2)
Total Protein: 7.5 g/dL (ref 6.5–8.1)

## 2021-06-10 LAB — URINALYSIS, MICROSCOPIC (REFLEX)

## 2021-06-10 LAB — URINALYSIS, ROUTINE W REFLEX MICROSCOPIC
Bilirubin Urine: NEGATIVE
Glucose, UA: NEGATIVE mg/dL
Hgb urine dipstick: NEGATIVE
Ketones, ur: >80 mg/dL — AB
Leukocytes,Ua: NEGATIVE
Nitrite: NEGATIVE
Protein, ur: 100 mg/dL — AB
Specific Gravity, Urine: 1.025 (ref 1.005–1.030)
pH: 7 (ref 5.0–8.0)

## 2021-06-10 LAB — MAGNESIUM: Magnesium: 2.1 mg/dL (ref 1.7–2.4)

## 2021-06-10 MED ORDER — LIDOCAINE VISCOUS HCL 2 % MT SOLN
15.0000 mL | Freq: Once | OROMUCOSAL | Status: AC
  Administered 2021-06-10: 15 mL via ORAL
  Filled 2021-06-10: qty 15

## 2021-06-10 MED ORDER — LACTATED RINGERS IV BOLUS
1000.0000 mL | Freq: Once | INTRAVENOUS | Status: AC
  Administered 2021-06-10: 1000 mL via INTRAVENOUS

## 2021-06-10 MED ORDER — DOCUSATE SODIUM 100 MG PO CAPS: 300.0000 mg | ORAL_CAPSULE | Freq: Two times a day (BID) | ORAL | 0 refills | Status: DC

## 2021-06-10 MED ORDER — ALUM & MAG HYDROXIDE-SIMETH 200-200-20 MG/5ML PO SUSP
30.0000 mL | Freq: Once | ORAL | Status: AC
  Administered 2021-06-10: 30 mL via ORAL
  Filled 2021-06-10: qty 30

## 2021-06-10 MED ORDER — ONDANSETRON 4 MG PO TBDP: 4.0000 mg | ORAL_TABLET | Freq: Three times a day (TID) | ORAL | 1 refills | Status: DC | PRN

## 2021-06-10 MED ORDER — ONDANSETRON 4 MG PO TBDP
8.0000 mg | ORAL_TABLET | Freq: Once | ORAL | Status: AC
  Administered 2021-06-10: 8 mg via ORAL
  Filled 2021-06-10: qty 2

## 2021-06-10 MED ORDER — POLYETHYLENE GLYCOL 3350 17 G PO PACK: 17.0000 g | PACK | Freq: Every day | ORAL | 0 refills | Status: DC

## 2021-06-10 NOTE — Discharge Instructions (Signed)
-  [name] was canceled, make appt for New OB visit -take zofran every 8 hours but also take constipation prevention measures -medicine will help with vomiting but she may still have some vomiting and nausea throughout the day; return to MAU if can't keep liquids down

## 2021-06-10 NOTE — MAU Provider Note (Signed)
[name] is a 25 y.o. [phone] At [redacted]w[redacted]d here with complaints of nausea and vomiting and cramping that started today. The nausea and vomiting started last week, "progressing rapidly". She reports that she can't eat anything. She has been throwing up since 10 am.   She denies vaginal bleeding, dysuria, constipation, SOB, fever. She has no medicine for morning sickness. She reports that Zofran has worked in the past but that it also made her constipated.    History     CSN: [id number]  Arrival date and time: 06/10/21 1928   Event Date/Time   First Provider Initiated Contact with Patient 06/10/21 2008      Chief Complaint  Patient presents with   Abdominal Pain   Emesis   Nausea   Abdominal Pain This is a new problem. The current episode started today. The problem has been unchanged. The pain is located in the generalized abdominal region. The pain is at a severity of 5/10. The quality of the pain is sharp and burning ("the back of my throat is also burning"). Associated symptoms include vomiting. Pertinent negatives include no constipation, diarrhea, dysuria or fever.  Emesis  This is a new problem. The current episode started in the past 7 days. The problem occurs more than 10 times per day. The problem has been gradually worsening. The emesis has an appearance of bile and stomach contents. There has been no fever. Associated symptoms include abdominal pain. Pertinent negatives include no diarrhea or fever.   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   Needs genetic counseling   Eczema 10/04/2014   Faintness 10/05/2015    Family dynamics problem 03/25/2013   Fetal tachycardia affecting management of mother 09/21/2018   GBS (group B Streptococcus carrier), +RV culture, currently pregnant 11/14/2015   GERD (gastroesophageal reflux disease) 09/21/2015   H/O scabies 11/14/2015   Treated with permetherin    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  Flu Vaccine   Genetic Screen  NIPS: low risk female      TDaP vaccine    Hgb A1C or  GTT  Third trimester  Rhogam  n/a   LAB RESULTS  Feeding Plan Breast/Bottle Blood Type A/Positive/-- (07/16 1206)  Contraception undecided Antibody Negative (07/16   Thyroid disease     Past Surgical History:  Procedure Laterality Date   TOOTH EXTRACTION     WRIST SURGERY  08/30/2020    Family History  Problem Relation Age of Onset   Asthma Father    Diabetes Maternal Grandmother    Hyperlipidemia Paternal Grandfather    Heart disease Paternal Grandfather    Cancer - Colon Maternal Grandfather    Mental illness Neg Hx    Birth defects Neg Hx    Kidney disease Neg Hx    Hypertension Neg Hx     Social History   Tobacco Use   Smoking status: Never   Smokeless tobacco: Never  Vaping Use   Vaping Use: Never used  Substance Use Topics   Alcohol use: No    Alcohol/week: 0.0 standard drinks   Drug use: No    Allergies:  Allergies  Allergen Reactions   Azithromycin Diarrhea    Diarrhea, nausea/vomiting, IV burns vein Diarrhea, nausea/vomiting, IV burns vein    Medications Prior to Admission  Medication Sig Dispense Refill Last Dose   Prenatal Vit-Fe Fumarate-FA (PREPLUS) 27-1 MG TABS Take 1  tablet by mouth daily. (Patient not taking: Reported on 05/30/2021) 30 tablet 13     Review of Systems  Constitutional:  Negative for fever.  Gastrointestinal:  Positive for abdominal pain and vomiting. Negative for constipation and diarrhea.  Genitourinary:  Negative for dysuria.  Physical Exam   Blood pressure 115/61, pulse 80, temperature 98.6 F (37 C), resp. rate 17, height $RemoveBe'5\' 3"'EQdooXeaG$  (1.6 m), weight 57.2 kg, SpO2 100 %, currently breastfeeding.  Physical Exam Constitutional:      Appearance: She is well-developed.  Abdominal:     General: Abdomen is flat. There is no distension or abdominal bruit.     Palpations: Abdomen is soft.     Tenderness: There is no abdominal tenderness. There is no guarding. Negative signs include Murphy's sign and McBurney's sign.  Neurological:     Mental Status: She is alert.    MAU Course  Procedures  MDM -UA shows 80 ketones -will give IV fluids, ODT zofran and GI cocktail  -CMP, Mag>normal  -US shows SIUP at 6 w 4 days with cardiac activity  -2245:Patient feels better, no vomiting while in MAU  Assessment and Plan   1. Morning sickness   2. Abdominal pain    -patient not to keep [name] appt tomorrow; message sent to [name] to cancel -patient given Zofran and anti-constipation meds -reviewed warning signs and when to return to MAU -All questions answered  [place]arr [place] 06/10/2021, 8:13 PM

## 2021-06-10 NOTE — MAU Note (Signed)
Pt reports vomiting all day today, unable to keep anything down. Vomiting every day but today is much worse. Generalized abd cramping.

## 2021-06-11 ENCOUNTER — Inpatient Hospital Stay: Admission: RE | Admit: 2021-06-11 | Payer: Medicaid Other | Source: Ambulatory Visit

## 2021-06-28 ENCOUNTER — Other Ambulatory Visit: Payer: Self-pay

## 2021-06-28 DIAGNOSIS — O210 Mild hyperemesis gravidarum: Secondary | ICD-10-CM

## 2021-06-28 MED ORDER — PROMETHAZINE HCL 25 MG PO TABS: 25.0000 mg | ORAL_TABLET | Freq: Four times a day (QID) | ORAL | 0 refills | Status: DC | PRN

## 2021-07-25 ENCOUNTER — Other Ambulatory Visit (HOSPITAL_COMMUNITY)
Admission: RE | Admit: 2021-07-25 | Discharge: 2021-07-25 | Disposition: A | Discharge status: Home / Self Care | Payer: Medicaid Other | Source: Ambulatory Visit | Attending: Obstetrics and Gynecology | Admitting: Obstetrics and Gynecology

## 2021-07-25 ENCOUNTER — Other Ambulatory Visit: Payer: Self-pay

## 2021-07-25 ENCOUNTER — Encounter: Payer: Self-pay | Admitting: Obstetrics & Gynecology

## 2021-07-25 ENCOUNTER — Ambulatory Visit (INDEPENDENT_AMBULATORY_CARE_PROVIDER_SITE_OTHER): Payer: Medicaid Other | Admitting: Obstetrics & Gynecology

## 2021-07-25 VITALS — BP 110/68 | HR 81 | Wt 131.6 lb

## 2021-07-25 DIAGNOSIS — Z3480 Encounter for supervision of other normal pregnancy, unspecified trimester: Secondary | ICD-10-CM | POA: Diagnosis not present

## 2021-07-25 DIAGNOSIS — O26891 Other specified pregnancy related conditions, first trimester: Secondary | ICD-10-CM

## 2021-07-25 DIAGNOSIS — Z3A13 13 weeks gestation of pregnancy: Secondary | ICD-10-CM

## 2021-07-25 DIAGNOSIS — Z136 Encounter for screening for cardiovascular disorders: Secondary | ICD-10-CM

## 2021-07-25 DIAGNOSIS — N898 Other specified noninflammatory disorders of vagina: Secondary | ICD-10-CM

## 2021-07-25 DIAGNOSIS — Z31430 Encounter of female for testing for genetic disease carrier status for procreative management: Secondary | ICD-10-CM | POA: Diagnosis not present

## 2021-07-25 HISTORY — DX: Encounter for supervision of other normal pregnancy, unspecified trimester: Z34.80

## 2021-07-25 MED ORDER — BLOOD PRESSURE MONITORING DEVI: 1.0000 | 0 refills | Status: DC

## 2021-07-25 NOTE — Addendum Note (Signed)
Addended by: Bethanne Ginger on: 07/25/2021 04:41 PM   Modules accepted: Orders
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
## 2021-07-25 NOTE — Patient Instructions (Signed)

## 2021-07-26 LAB — CERVICOVAGINAL ANCILLARY ONLY
Bacterial Vaginitis (gardnerella): NEGATIVE
Candida Glabrata: NEGATIVE
Candida Vaginitis: NEGATIVE
Chlamydia: NEGATIVE
Comment: NEGATIVE
Comment: NEGATIVE
Comment: NEGATIVE
Comment: NEGATIVE
Comment: NEGATIVE
Comment: NORMAL
Neisseria Gonorrhea: NEGATIVE
Trichomonas: NEGATIVE

## 2021-07-26 LAB — CBC/D/PLT+RPR+RH+ABO+RUBIGG...
Antibody Screen: NEGATIVE
Basophils Absolute: 0 10*3/uL (ref 0.0–0.2)
Basos: 0 %
EOS (ABSOLUTE): 0.2 10*3/uL (ref 0.0–0.4)
Eos: 2 %
HCV Ab: 0.1 s/co ratio (ref 0.0–0.9)
HIV Screen 4th Generation wRfx: NONREACTIVE
Hematocrit: 39.2 % (ref 34.0–46.6)
Hemoglobin: 13.3 g/dL (ref 11.1–15.9)
Hepatitis B Surface Ag: NEGATIVE
Immature Grans (Abs): 0.1 10*3/uL (ref 0.0–0.1)
Immature Granulocytes: 1 %
Lymphocytes Absolute: 2.1 10*3/uL (ref 0.7–3.1)
Lymphs: 21 %
MCH: 30.6 pg (ref 26.6–33.0)
MCHC: 33.9 g/dL (ref 31.5–35.7)
MCV: 90 fL (ref 79–97)
Monocytes Absolute: 0.6 10*3/uL (ref 0.1–0.9)
Monocytes: 6 %
Neutrophils Absolute: 7.3 10*3/uL — ABNORMAL HIGH (ref 1.4–7.0)
Neutrophils: 70 %
Platelets: 240 10*3/uL (ref 150–450)
RBC: 4.35 x10E6/uL (ref 3.77–5.28)
RDW: 12.5 % (ref 11.7–15.4)
RPR Ser Ql: NONREACTIVE
Rh Factor: POSITIVE
Rubella Antibodies, IGG: 1.25 index (ref >0.99)
WBC: 10.3 10*3/uL (ref 3.4–10.8)

## 2021-07-26 LAB — HEMOGLOBIN A1C
Est. average glucose Bld gHb Est-mCnc: 97 mg/dL
Hgb A1c MFr Bld: 5 % (ref 4.8–5.6)

## 2021-07-26 LAB — HCV INTERPRETATION

## 2021-07-27 LAB — CYTOLOGY - PAP: Diagnosis: NEGATIVE

## 2021-07-27 LAB — URINE CULTURE, OB REFLEX

## 2021-07-27 LAB — CULTURE, OB URINE

## 2021-08-08 ENCOUNTER — Encounter: Payer: Self-pay | Admitting: General Practice

## 2021-08-14 ENCOUNTER — Telehealth: Payer: Self-pay | Admitting: Obstetrics and Gynecology

## 2021-08-14 NOTE — Telephone Encounter (Signed)
PC to Ms. 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  The patient is currently [redacted] weeks gestation with a female fetus per NIPS screening.  She was inquiring about the possibility to non invasive testing options for DMD.  I explained that while the technology is available for some chromosome conditions and a few single gene disorders, there is currently no testing available for DMD.  Therefore, amniocentesis is her only option at this point in pregnancy.  The patient declines this testing and also declined to have a formal genetic counseling visit, as she is already familiar with this condition and its inheritance.  I provided the website for Early Check, which she participated in following her last delivery.  This study can provide free testing for DMD in the newborn period.  I can be reached at [phone] if needed.  Wilburt Finlay, MS, CGC

## 2021-08-17 ENCOUNTER — Other Ambulatory Visit: Payer: Self-pay

## 2021-08-17 ENCOUNTER — Ambulatory Visit (INDEPENDENT_AMBULATORY_CARE_PROVIDER_SITE_OTHER): Payer: Medicaid Other | Admitting: Family Medicine

## 2021-08-17 VITALS — BP 102/62 | HR 79 | Wt 135.1 lb

## 2021-08-17 DIAGNOSIS — Z3480 Encounter for supervision of other normal pregnancy, unspecified trimester: Secondary | ICD-10-CM

## 2021-08-17 DIAGNOSIS — Z8639 Personal history of other endocrine, nutritional and metabolic disease: Secondary | ICD-10-CM

## 2021-08-17 DIAGNOSIS — Z3A16 16 weeks gestation of pregnancy: Secondary | ICD-10-CM

## 2021-08-17 NOTE — Progress Notes (Signed)
   Subjective:  Angela Branch is a 25 y.o. [phone] at [redacted]w[redacted]d being seen today for ongoing prenatal care.  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  Patient reports no complaints.  Contractions: Not present. Vag. Bleeding: None.  Movement: Present. Denies leaking of fluid.   The following portions of the patient's history were reviewed and updated as appropriate: allergies, current medications, past family history, past medical history, past social history, past surgical history and problem list. Problem list updated.  Objective:   Vitals:   08/17/21 1122  BP: 102/62  Pulse: 79  Weight: 135 lb 1.6 oz (61.3 kg)    Fetal Status: Fetal Heart Rate (bpm): 152 Fundal Height: 16 cm Movement: Present     General:  Alert, oriented and cooperative. Patient is in no acute distress.  Skin: Skin is warm and dry. No rash noted.   Cardiovascular: Normal heart rate noted  Respiratory: Normal respiratory effort, no problems with respiration noted  Abdomen: Soft, gravid, appropriate for gestational age. Pain/Pressure: Absent     Pelvic: Vag. Bleeding: None     Cervical exam deferred        Extremities: Normal range of motion.  Edema: None  Mental Status: Normal mood and affect. Normal behavior. Normal judgment and thought content.    Assessment and Plan:  Pregnancy: [id number] at [redacted]w[redacted]d  1. Supervision of other normal pregnancy, antepartum Patient progressing in pregnancy without concerns today. Has anatomy US scheduled on 09/03/2021. No recent HSV outbreaks. Discussed prophylaxis starting at 36 wks and patient expressed understanding. Previously had expressed interest in BTL- now reports does not want tubal. Discussed potential other forms of contraception and that tubal is  permanent. Also discussed if she does change her mind would need paperwork signed atleast 30 days prior to procedure. - follow up in 4 weeks  2. History of thyroid disease Hx of low TSH at last pregnancy in 2019, but normal T4 and T3. Patient reports no thyroid disease when not pregnant. - TSH and thyroid panel today  Preterm labor symptoms and general obstetric precautions including but not limited to vaginal bleeding, contractions, leaking of fluid and fetal movement were reviewed in detail with the patient. Please refer to After Visit Summary for other counseling recommendations.  Return in about 4 weeks (around 09/14/2021), or [facility], any provider.   Renard Matter, MD, MPH OB Fellow, Faculty Practice

## 2021-08-18 LAB — THYROID PANEL WITH TSH
Free Thyroxine Index: 1.5 (ref 1.2–4.9)
T3 Uptake Ratio: 13 % — ABNORMAL LOW (ref 24–39)
T4, Total: 11.8 ug/dL (ref 4.5–12.0)
TSH: 0.728 u[IU]/mL (ref 0.450–4.500)

## 2021-09-03 ENCOUNTER — Other Ambulatory Visit: Payer: Self-pay | Admitting: Obstetrics & Gynecology

## 2021-09-03 ENCOUNTER — Ambulatory Visit: Payer: Medicaid Other | Attending: Obstetrics & Gynecology

## 2021-09-03 ENCOUNTER — Other Ambulatory Visit: Payer: Self-pay

## 2021-09-03 DIAGNOSIS — Z3480 Encounter for supervision of other normal pregnancy, unspecified trimester: Secondary | ICD-10-CM

## 2021-09-04 ENCOUNTER — Other Ambulatory Visit: Payer: Self-pay | Admitting: *Deleted

## 2021-09-04 DIAGNOSIS — O99282 Endocrine, nutritional and metabolic diseases complicating pregnancy, second trimester: Secondary | ICD-10-CM

## 2021-09-04 DIAGNOSIS — E0590 Thyrotoxicosis, unspecified without thyrotoxic crisis or storm: Secondary | ICD-10-CM

## 2021-09-04 DIAGNOSIS — Z362 Encounter for other antenatal screening follow-up: Secondary | ICD-10-CM

## 2021-09-06 ENCOUNTER — Inpatient Hospital Stay (HOSPITAL_COMMUNITY)
Admission: AD | Admit: 2021-09-06 | Discharge: 2021-09-06 | Disposition: A | Discharge status: Home / Self Care | Payer: Medicaid Other | Attending: Obstetrics and Gynecology | Admitting: Obstetrics and Gynecology

## 2021-09-06 ENCOUNTER — Encounter (HOSPITAL_COMMUNITY): Payer: Self-pay | Admitting: Obstetrics and Gynecology

## 2021-09-06 ENCOUNTER — Other Ambulatory Visit: Payer: Self-pay

## 2021-09-06 ENCOUNTER — Inpatient Hospital Stay (HOSPITAL_BASED_OUTPATIENT_CLINIC_OR_DEPARTMENT_OTHER): Payer: Medicaid Other

## 2021-09-06 DIAGNOSIS — O99512 Diseases of the respiratory system complicating pregnancy, second trimester: Secondary | ICD-10-CM | POA: Insufficient documentation

## 2021-09-06 DIAGNOSIS — E0590 Thyrotoxicosis, unspecified without thyrotoxic crisis or storm: Secondary | ICD-10-CM | POA: Diagnosis not present

## 2021-09-06 DIAGNOSIS — N898 Other specified noninflammatory disorders of vagina: Secondary | ICD-10-CM | POA: Diagnosis not present

## 2021-09-06 DIAGNOSIS — O99012 Anemia complicating pregnancy, second trimester: Secondary | ICD-10-CM | POA: Diagnosis not present

## 2021-09-06 DIAGNOSIS — Z0371 Encounter for suspected problem with amniotic cavity and membrane ruled out: Secondary | ICD-10-CM

## 2021-09-06 DIAGNOSIS — O26899 Other specified pregnancy related conditions, unspecified trimester: Secondary | ICD-10-CM

## 2021-09-06 DIAGNOSIS — Z3A19 19 weeks gestation of pregnancy: Secondary | ICD-10-CM | POA: Insufficient documentation

## 2021-09-06 DIAGNOSIS — Z560 Unemployment, unspecified: Secondary | ICD-10-CM | POA: Insufficient documentation

## 2021-09-06 DIAGNOSIS — O99891 Other specified diseases and conditions complicating pregnancy: Secondary | ICD-10-CM

## 2021-09-06 DIAGNOSIS — O99282 Endocrine, nutritional and metabolic diseases complicating pregnancy, second trimester: Secondary | ICD-10-CM | POA: Diagnosis not present

## 2021-09-06 DIAGNOSIS — Z363 Encounter for antenatal screening for malformations: Secondary | ICD-10-CM | POA: Diagnosis not present

## 2021-09-06 DIAGNOSIS — J45909 Unspecified asthma, uncomplicated: Secondary | ICD-10-CM | POA: Diagnosis not present

## 2021-09-06 DIAGNOSIS — O209 Hemorrhage in early pregnancy, unspecified: Secondary | ICD-10-CM | POA: Insufficient documentation

## 2021-09-06 DIAGNOSIS — Z148 Genetic carrier of other disease: Secondary | ICD-10-CM | POA: Insufficient documentation

## 2021-09-06 DIAGNOSIS — R109 Unspecified abdominal pain: Secondary | ICD-10-CM

## 2021-09-06 DIAGNOSIS — Z881 Allergy status to other antibiotic agents status: Secondary | ICD-10-CM | POA: Insufficient documentation

## 2021-09-06 DIAGNOSIS — Z3480 Encounter for supervision of other normal pregnancy, unspecified trimester: Secondary | ICD-10-CM

## 2021-09-06 DIAGNOSIS — O26892 Other specified pregnancy related conditions, second trimester: Secondary | ICD-10-CM | POA: Diagnosis not present

## 2021-09-06 DIAGNOSIS — R55 Syncope and collapse: Secondary | ICD-10-CM | POA: Diagnosis not present

## 2021-09-06 LAB — COMPREHENSIVE METABOLIC PANEL
ALT: 11 U/L (ref 0–44)
AST: 18 U/L (ref 15–41)
Albumin: 3 g/dL — ABNORMAL LOW (ref 3.5–5.0)
Alkaline Phosphatase: 28 U/L — ABNORMAL LOW (ref 38–126)
Anion gap: 5 (ref 5–15)
BUN: 6 mg/dL (ref 6–20)
CO2: 24 mmol/L (ref 22–32)
Calcium: 8.9 mg/dL (ref 8.9–10.3)
Chloride: 106 mmol/L (ref 98–111)
Creatinine, Ser: 0.67 mg/dL (ref 0.44–1.00)
GFR, Estimated: >60 mL/min (ref >60)
Glucose, Bld: 70 mg/dL (ref 70–99)
Potassium: 3.1 mmol/L — ABNORMAL LOW (ref 3.5–5.1)
Sodium: 135 mmol/L (ref 135–145)
Total Bilirubin: 1.3 mg/dL — ABNORMAL HIGH (ref 0.3–1.2)
Total Protein: 5.6 g/dL — ABNORMAL LOW (ref 6.5–8.1)

## 2021-09-06 LAB — WET PREP, GENITAL
Clue Cells Wet Prep HPF POC: NONE SEEN
Sperm: NONE SEEN
Trich, Wet Prep: NONE SEEN
Yeast Wet Prep HPF POC: NONE SEEN

## 2021-09-06 LAB — URINALYSIS, ROUTINE W REFLEX MICROSCOPIC
Bilirubin Urine: NEGATIVE
Glucose, UA: NEGATIVE mg/dL
Hgb urine dipstick: NEGATIVE
Ketones, ur: 5 mg/dL — AB
Leukocytes,Ua: NEGATIVE
Nitrite: NEGATIVE
Protein, ur: NEGATIVE mg/dL
Specific Gravity, Urine: 1.02 (ref 1.005–1.030)
pH: 5 (ref 5.0–8.0)

## 2021-09-06 LAB — CBC
HCT: 32.8 % — ABNORMAL LOW (ref 36.0–46.0)
Hemoglobin: 10.8 g/dL — ABNORMAL LOW (ref 12.0–15.0)
MCH: 30.6 pg (ref 26.0–34.0)
MCHC: 32.9 g/dL (ref 30.0–36.0)
MCV: 92.9 fL (ref 80.0–100.0)
Platelets: 187 10*3/uL (ref 150–400)
RBC: 3.53 MIL/uL — ABNORMAL LOW (ref 3.87–5.11)
RDW: 13.4 % (ref 11.5–15.5)
WBC: 9.5 10*3/uL (ref 4.0–10.5)
nRBC: 0 % (ref 0.0–0.2)

## 2021-09-06 LAB — AMNISURE RUPTURE OF MEMBRANE (ROM) NOT AT ARMC: Amnisure ROM: NEGATIVE

## 2021-09-06 LAB — TSH: TSH: 0.626 u[IU]/mL (ref 0.350–4.500)

## 2021-09-06 MED ORDER — ACETAMINOPHEN 500 MG PO TABS
1000.0000 mg | ORAL_TABLET | Freq: Once | ORAL | Status: AC
  Administered 2021-09-06: 1000 mg via ORAL
  Filled 2021-09-06 (×2): qty 2

## 2021-09-06 MED ORDER — LACTATED RINGERS IV BOLUS
1000.0000 mL | Freq: Once | INTRAVENOUS | Status: AC
  Administered 2021-09-06: 1000 mL via INTRAVENOUS

## 2021-09-06 MED ORDER — ONDANSETRON HCL 4 MG/2ML IJ SOLN
4.0000 mg | Freq: Once | INTRAMUSCULAR | Status: AC
  Administered 2021-09-06: 4 mg via INTRAVENOUS
  Filled 2021-09-06: qty 2

## 2021-09-06 NOTE — MAU Note (Addendum)
Presents stating she had a syncopal episode approx 2 hours ago and hit abdomen.  Also states noticed pink spotting after fainting episode and reports underwear moist.  Unsure if LOF. States having lower abdominal cramping too. Denies recent intercourse.

## 2021-09-06 NOTE — MAU Provider Note (Addendum)
History    [id number]  Arrival date and time: 09/06/21 1201    Chief Complaint  Patient presents with   Syncope   HPI Angela Branch is a 25 y.o. at [redacted]w[redacted]d by 6 wk U/S with PMHx notable for prior syncopal episodes, palpitations, and thyroid disease, who presents for syncope.   #Syncope -Today, pt was cleaning in the kitchen when she began to have hot flashes and a pounding heart with an urge to vomit for 2-3 min.  -She felt lightheaded, dizzy, and thought the room was spinning around.  -She began to notice her vision become blurry and blacked out.  - Between 5-10 min she stated she was passed out laying on the floor on her stomach. - reports she had similar episodes with prior pregnancy  - she had been given a continuous cardiac monitor in the past but was allergic to the glue so only had monitoring for about a day which showed sinus tachycardia - denies chest pain  - denies numbness or tingling - On multiple occasions she has noticed if she stands up quickly from a seated position or if she mildly exerts herself she feels an urge to pass out - has drank about 1.5 bottles of water today   #vaginal spotting #increased discharge/fluid - when she stood up after her syncopal episode noticed vaginal spotting with a pink fluid discoloration - also saw some fluid - since then she has been wearing a pad and felt it was soaked t. Endorses SOB but states this has occurred throughout her pregnancy. She has no hx of seizures. Denies chest pain.   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   Needs genetic counseling   Eczema 10/04/2014   Faintness 10/05/2015   Family dynamics problem 03/25/2013   Fetal tachycardia affecting management of mother 09/21/2018   GBS (group B Streptococcus carrier), +RV culture, currently pregnant 11/14/2015   GERD (gastroesophageal reflux disease) 09/21/2015   H/O scabies 11/14/2015   Treated with permetherin    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  Flu Vaccine   Genetic Screen  NIPS: low risk female      TDaP vaccine    Hgb A1C or  GTT  Third trimester  Rhogam  n/a   LAB RESULTS  Feeding Plan Breast/Bottle Blood  Type A/Positive/-- (07/16 1206)  Contraception undecided Antibody Negative (07/16   Thyroid disease     Past Surgical History:  Procedure Laterality Date   TOOTH EXTRACTION     WRIST SURGERY  08/30/2020    Family History  Problem Relation Age of Onset   Asthma Father    Diabetes Maternal Grandmother    Hyperlipidemia Paternal Grandfather    Heart disease Paternal Grandfather    Cancer - Colon Maternal Grandfather    Mental illness Neg Hx    Birth defects Neg Hx    Kidney disease Neg Hx    Hypertension Neg Hx     Social History   Socioeconomic History   Marital status: Single    Spouse name: Not on file   Number of children: Not on file   Years of education: 9th   Highest education level: Not on file  Occupational History    Employer: UNEMPLOYED  Tobacco Use   Smoking status: Never   Smokeless tobacco: Never  Vaping Use    Vaping Use: Never used  Substance and Sexual Activity   Alcohol use: No    Alcohol/week: 0.0 standard drinks   Drug use: No   Sexual activity: Yes    Birth control/protection: None  Other Topics Concern   Not on file  Social History Narrative   Not on file   Social Determinants of Health   Financial Resource Strain: Not on file  Food Insecurity: No Food Insecurity   Worried About Running Out of Food in the Last Year: Never true   Ran Out of Food in the Last Year: Never true  Transportation Needs: No Transportation Needs   Lack of Transportation (Medical): No   Lack of Transportation (Non-Medical): No  Physical Activity: Not on file  Stress: Not on file  Social Connections: Not on file  Intimate Partner Violence: Not on file    Allergies  Allergen Reactions   Azithromycin Diarrhea    Diarrhea, nausea/vomiting, IV burns vein Diarrhea, nausea/vomiting, IV burns vein    No current facility-administered medications on file prior to encounter.   Current Outpatient Medications on File Prior to Encounter  Medication Sig Dispense Refill   Blood Pressure Monitoring DEVI 1 each by Does not apply route once a week. (Patient not taking: Reported on 08/17/2021) 1 each 0   Review of Systems  Constitutional:  Negative for fever.  HENT:  Positive for tinnitus.   Eyes:  Positive for blurred vision.  Respiratory:  Positive for shortness of breath.   Cardiovascular:  Positive for palpitations. Negative for chest pain.  Gastrointestinal:  Negative for abdominal pain, nausea and vomiting.  Neurological:  Positive for dizziness and headaches. Negative for focal weakness and seizures.   Physical Exam   BP (!) 96/53 (BP Location: Right Arm)   Pulse 100   Temp 98 F (36.7 C) (Oral)   Resp 19   Ht 5\' 3"  (1.6 m)   Wt 62.8 kg   SpO2 100%   BMI 24.52 kg/m   Patient Vitals for the past 24 hrs:  BP Temp Temp src Pulse Resp SpO2 Height Weight  09/06/21 1355 -- -- -- -- -- 100 % -- --   09/06/21 1217 (!) 96/53 98 F (36.7 C) Oral 100 19 99 % -- --  09/06/21 1210 -- -- -- -- -- -- 5\' 3"  (1.6 m) 62.8 kg    Physical Exam Constitutional:      General: She is not in acute distress.  Appearance: Normal appearance.  HENT:     Head: Normocephalic and atraumatic.  Eyes:     Extraocular Movements: Extraocular movements intact.     Conjunctiva/sclera: Conjunctivae normal.     Pupils: Pupils are equal, round, and reactive to light.  Cardiovascular:     Rate and Rhythm: tachycardic on exam    Pulses: Normal pulses.     Heart sounds: Normal heart sounds.  Pulmonary:     Effort: Pulmonary effort is normal.  Abdominal:     Palpations: Abdomen is soft.     Tenderness: There is no abdominal tenderness.  Skin:    General: Skin is warm and dry.  Neurological:     General: No focal deficit present.     Mental Status: She is alert.   Speculum Exam 2-3 cc of cloudy white fluid in posterior vaginal vault. No bleeding   FHT by Doppler Baseline 145  Labs CBC H/H: 10.8/32.8 (down from 13.3/39.2) LFTs: AST 18, ALT 11 Fern: negative Amnisure: negative    Imaging Indications  Hyperthyroid(No Meds)                          O99.280 E05.90  [redacted] weeks gestation of pregnancy                Z3A.19  Genetic carrier (Duchenne muscular             Z14.8  dystrophy)  Asthma                                         O99.89 j45.909  Encounter for antenatal screening for          Z36.3  malformations  Low Risk NIPS(Negative Horizon) ---------------------------------------------------------------------- Fetal Evaluation  Num Of Fetuses:         1  Fetal Heart Rate(bpm):  157  Cardiac Activity:       Observed  Presentation:           Cephalic  Placenta:               Anterior  P. Cord Insertion:      Visualized  Amniotic Fluid  AFI FV:      Within normal limits                              Largest Pocket(cm)                               4.9 ---------------------------------------------------------------------- Biometry  LV:        6.7  mm ---------------------------------------------------------------------- OB History  Gravidity:    5         Term:   3        Prem:   0        SAB:   1  TOP:          0       Ectopic:  0        Living: 3 ---------------------------------------------------------------------- Gestational Age  Best:          19w 1d     Det. ByLoman Chroman         EDD:   01/30/22                                      (  06/10/21) ---------------------------------------------------------------------- Anatomy  Cranium:               Appears normal         Stomach:                Appears normal, left                                                                        sided  Ventricles:            Appears normal         Kidneys:                Appear normal  Thoracic:              Appears normal         Bladder:                Appears normal  Heart:                 Appears normal         Spine:                  Appears normal                         (4CH, axis, and                         situs)  Diaphragm:             Appears normal ---------------------------------------------------------------------- Cervix Uterus Adnexa  Cervix  Length:           4.21  cm. ---------------------------------------------------------------------- Comments  This patient presented to the MAU due to syncope.  A limited ultrasound performed today shows that the fetus is  in the vertex presentation.  There was normal amniotic fluid noted. ----------------------------------------------------------------------  EKG Rate 90, rhythm normal sinus rhythm. No evidence of ischemia. Axis normal.  MAU Course  Procedures Lab Orders         Wet prep, genital         Urinalysis, Routine w reflex microscopic Urine, Clean Catch         TSH         Comprehensive metabolic panel         CBC         Amnisure rupture of membrane  (rom)not at [facility]     Meds ordered this encounter  Medications   lactated ringers bolus 1,000 mL    Imaging Orders  No imaging studies ordered today   MDM moderate  Assessment and Plan  Angela Branch is a 25 y.o. at [redacted]w[redacted]d by 6 wk U/S with PMHx notable for prior syncopal episodes, palpitations, and thyroid disease, who presents for syncope.   Plan: #Syncope #Weakness Patient with hx of palpitations presents after syncopal episode today where she lost consciousness and fell on her abdomen. She has known history of similar episodes in prior pregnancies and has a cardiologist. EKG today is NSR. Patient likely volume down given only intake ~2-3 bottles of water a day. DDX includes dehydration vs. Cardiac cause vs. Neurological cause (less  likely given no neurological symptoms) Initial. BP suggestive of diastolic hypotension. Also on ddx anemia, and hypohyroidism. Given, pt's history of palpitations consider PVC or PACs on the differential. Will obtain CBC, TSH, and EKG to rule out. -Start 1 L IV LR  -Order EKG -Order UA, CMP, CBC, TSH  -Obtain orthostatic vitals   Reassessment Normal orthostatic vitals. BP and HR improved with IV fluids. CBC with mild anemia and will start PO iron. TSH normal.  Patient has cardiologist follow up in October. Provider to message cardiology group to request moving up appointment.  #Vaginal discharge  #Concern for leaking of fluid #Abdominal cramping Reports increased fluid starting this morning. On speculum exam thin fluid in vault, negative Fern, negative amnisure. Ultrasound with MVP 4.9. Given extensive workup without evidence of amnisure vaginal discharge may be suggestive of an infection such as BV or yeast. Cervical exam with 0.5 external os but internal os closed/thick/high. Cervical length 4cm. At this time no sign of contractions as abdominal cramping is constant.Angela Branch flexeril for symptom relief but patient deferred.  -Wet prep genital -  GC/CT sent - provided return precautions  - message sent to [facility] for follow up appointment in next 2-4 days   Monia Pouch, Medical Student 09/06/21 2:40 PM  Patient seen with medical student listed above who assisted with this documentation at the beginning of the workup. Patient's care was supervised by me and I conducted all the exams including pelvic exams.  Renard Matter, MD, MPH OB Fellow, Faculty Practice

## 2021-09-06 NOTE — MAU Note (Signed)
Pt declined tylenol prior to u/s because she didn't like to take pills but declined any other meds. Denies nausea at this time. Once she returned from u/s pt refused tylenol for her headache again and states she feels nauseated. Provider notified and pt given zofran iv.

## 2021-09-06 NOTE — Discharge Instructions (Addendum)
You came to the hospital because you passed out and fell on your belly. You also felt fluid coming out of your vagina and had some lower abdominal pain.  We checked your vital signs and your EKG. Your heart rate was initially high but got better when we gave you fluids. Your EKG showed your heart rate was high but otherwise did not have any abnormalities at the time of your EKG.   We did a speculum exam which showed some fluid in your vagina so we checked swabs to see if your water was broken which were all negative. We also did an ultrasound to look at your cervix length which was normal and also to look at your fluid levels and they were normal. At this time we found no evidence that your water is broken.   Please seek medical care if your have vaginal bleeding, more fluid, cramping that gets worse.

## 2021-09-07 LAB — GC/CHLAMYDIA PROBE AMP (~~LOC~~) NOT AT ARMC
Chlamydia: NEGATIVE
Comment: NEGATIVE
Comment: NORMAL
Neisseria Gonorrhea: NEGATIVE

## 2021-09-08 LAB — CULTURE, OB URINE

## 2021-09-20 ENCOUNTER — Ambulatory Visit (INDEPENDENT_AMBULATORY_CARE_PROVIDER_SITE_OTHER): Payer: Medicaid Other | Admitting: Student

## 2021-09-20 ENCOUNTER — Other Ambulatory Visit: Payer: Self-pay

## 2021-09-20 VITALS — BP 113/66 | HR 87 | Wt 141.2 lb

## 2021-09-20 DIAGNOSIS — O99891 Other specified diseases and conditions complicating pregnancy: Secondary | ICD-10-CM

## 2021-09-20 DIAGNOSIS — Z3A21 21 weeks gestation of pregnancy: Secondary | ICD-10-CM
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
## 2021-10-01 ENCOUNTER — Other Ambulatory Visit: Payer: Self-pay | Admitting: *Deleted

## 2021-10-01 ENCOUNTER — Other Ambulatory Visit: Payer: Self-pay | Admitting: Maternal & Fetal Medicine

## 2021-10-01 ENCOUNTER — Other Ambulatory Visit: Payer: Self-pay

## 2021-10-01 ENCOUNTER — Encounter: Payer: Self-pay | Admitting: *Deleted

## 2021-10-01 ENCOUNTER — Ambulatory Visit: Payer: Medicaid Other

## 2021-10-01 ENCOUNTER — Ambulatory Visit: Payer: Medicaid Other | Attending: Maternal & Fetal Medicine

## 2021-10-01 ENCOUNTER — Ambulatory Visit: Payer: Medicaid Other | Admitting: *Deleted

## 2021-10-01 VITALS — BP 97/57 | HR 73

## 2021-10-01 DIAGNOSIS — Z148 Genetic carrier of other disease: Secondary | ICD-10-CM | POA: Insufficient documentation

## 2021-10-01 DIAGNOSIS — Z362 Encounter for other antenatal screening follow-up: Secondary | ICD-10-CM | POA: Diagnosis not present

## 2021-10-01 DIAGNOSIS — E0590 Thyrotoxicosis, unspecified without thyrotoxic crisis or storm: Secondary | ICD-10-CM

## 2021-10-01 DIAGNOSIS — Z3A23 23 weeks gestation of pregnancy: Secondary | ICD-10-CM | POA: Diagnosis not present

## 2021-10-01 DIAGNOSIS — O99512 Diseases of the respiratory system complicating pregnancy, second trimester: Secondary | ICD-10-CM | POA: Diagnosis not present

## 2021-10-01 DIAGNOSIS — G7101 Duchenne or Becker muscular dystrophy: Secondary | ICD-10-CM | POA: Diagnosis not present

## 2021-10-01 DIAGNOSIS — O99352 Diseases of the nervous system complicating pregnancy, second trimester: Secondary | ICD-10-CM | POA: Diagnosis not present

## 2021-10-01 DIAGNOSIS — O99282 Endocrine, nutritional and metabolic diseases complicating pregnancy, second trimester: Secondary | ICD-10-CM | POA: Insufficient documentation

## 2021-10-01 DIAGNOSIS — Z3A22 22 weeks gestation of pregnancy: Secondary | ICD-10-CM | POA: Diagnosis not present

## 2021-10-01 DIAGNOSIS — J45909 Unspecified asthma, uncomplicated: Secondary | ICD-10-CM | POA: Diagnosis not present

## 2021-10-01 DIAGNOSIS — Z3480 Encounter for supervision of other normal pregnancy, unspecified trimester: Secondary | ICD-10-CM

## 2021-10-04 ENCOUNTER — Telehealth: Payer: Self-pay | Admitting: Obstetrics and Gynecology

## 2021-10-04 LAB — SPECIMEN STATUS REPORT

## 2021-10-04 LAB — GENESEQ(R)PLUS, PRENATAL

## 2021-10-04 NOTE — Telephone Encounter (Signed)
I spoke with the patient after a conversation with [facility] about testing for this amniocentesis.  The following tests will be completed for this sample:  AFAFP - the amniotic fluid AFP results are available and are within normal limits.  Chromosomal Microarray - this testing is pending on direct amniotic fluid cells.  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  Results may take 2-3 weeks.  Maternal cell contamination will be run along with this sample.  Karyotype - chromosome analysis will be performed on cultured cells when they are available.  DMD gene specific testing - will also be performed to confirm the results of the microarray once cultured cells are available.    Each of these results will be called to the patient when they become available. We can be reached at [phone].  Wilburt Finlay, MS, CGC

## 2021-10-05 ENCOUNTER — Other Ambulatory Visit: Payer: Self-pay

## 2021-10-05 ENCOUNTER — Inpatient Hospital Stay (HOSPITAL_COMMUNITY)
Admission: AD | Admit: 2021-10-05 | Discharge: 2021-10-06 | Disposition: A | Discharge status: Home / Self Care | Payer: Medicaid Other | Attending: Obstetrics & Gynecology | Admitting: Obstetrics & Gynecology

## 2021-10-05 ENCOUNTER — Encounter (HOSPITAL_COMMUNITY): Payer: Self-pay | Admitting: Obstetrics & Gynecology

## 2021-10-05 DIAGNOSIS — O99891 Other specified diseases and conditions complicating pregnancy: Secondary | ICD-10-CM | POA: Diagnosis not present

## 2021-10-05 DIAGNOSIS — B9689 Other specified bacterial agents as the cause of diseases classified elsewhere: Secondary | ICD-10-CM

## 2021-10-05 DIAGNOSIS — Z0371 Encounter for suspected problem with amniotic cavity and membrane ruled out: Secondary | ICD-10-CM | POA: Diagnosis not present

## 2021-10-05 DIAGNOSIS — Z3480 Encounter for supervision of other normal pregnancy, unspecified trimester: Secondary | ICD-10-CM

## 2021-10-05 DIAGNOSIS — O4702 False labor before 37 completed weeks of gestation, second trimester: Secondary | ICD-10-CM

## 2021-10-05 DIAGNOSIS — N760 Acute vaginitis: Secondary | ICD-10-CM

## 2021-10-05 DIAGNOSIS — Z3A24 24 weeks gestation of pregnancy: Secondary | ICD-10-CM

## 2021-10-05 DIAGNOSIS — Z3689 Encounter for other specified antenatal screening: Secondary | ICD-10-CM

## 2021-10-05 DIAGNOSIS — O368120 Decreased fetal movements, second trimester, not applicable or unspecified: Secondary | ICD-10-CM | POA: Insufficient documentation

## 2021-10-05 LAB — URINALYSIS, ROUTINE W REFLEX MICROSCOPIC
Bilirubin Urine: NEGATIVE
Glucose, UA: NEGATIVE mg/dL
Hgb urine dipstick: NEGATIVE
Ketones, ur: 5 mg/dL — AB
Nitrite: NEGATIVE
Protein, ur: 30 mg/dL — AB
Specific Gravity, Urine: 1.029 (ref 1.005–1.030)
pH: 6 (ref 5.0–8.0)

## 2021-10-05 MED ORDER — CYCLOBENZAPRINE HCL 5 MG PO TABS
5.0000 mg | ORAL_TABLET | Freq: Once | ORAL | Status: AC
  Administered 2021-10-05: 5 mg via ORAL
  Filled 2021-10-05: qty 1

## 2021-10-05 NOTE — MAU Provider Note (Signed)
History     CSN: [id number]  Arrival date and time: 10/05/21 2220   Event Date/Time   First Provider Initiated Contact with Patient 10/05/21 2343      Chief Complaint  Patient presents with   Decreased Fetal Movement   Abdominal Pain   Angela Branch is a 25 y.o. [id number] at [redacted]w[redacted]d who receives care at [facility].  She presents today for Decreased Fetal Movement and Abdominal Pain.  She reports she had an amniocentesis on Monday and reports she was cramping and feeling some discomfort the first couple of days.  However today she is "just not feeling well."  She states she started having contractions around 0600 that have continued despite Tylenol, warm bath, and rest.  She states the contractions are every 5 to 10 minutes but have been consistent throughout the day.  She also reports she is having some questionable leaking and has changed her underwear twice today.  She denies recent sexual activity or issues with urination constipation or diarrhea but reports some pressure in the pelvic area.  She states since Monday she has experienced decreased fetal movement despite various interventions.  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   Needs genetic counseling   Eczema 10/04/2014   Faintness 10/05/2015   Family dynamics problem 03/25/2013   Fetal tachycardia affecting management of mother 09/21/2018   GBS (group B Streptococcus carrier), +RV culture, currently pregnant 11/14/2015   GERD (gastroesophageal reflux disease) 09/21/2015   H/O scabies 11/14/2015   Treated with  permetherin    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   Loss of weight 03/25/2013   Low back pain 09/20/2013   Menorrhagia 09/28/2013   Migraine headache 02/23/2014   Sleep apnea 04/14/2014   Substance abuse ([place]) 03/24/2015   + cannabis    Supervision of low-risk pregnancy 06/28/2019    Nursing Staff Provider Office Location  [facility] Dating   early u/s Language  English Anatomy US   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  Flu Vaccine   Genetic Screen  NIPS: low risk female      TDaP vaccine    Hgb A1C or  GTT  Third trimester  Rhogam  n/a   LAB RESULTS  Feeding Plan Breast/Bottle Blood Type A/Positive/-- (07/16 1206)  Contraception undecided Antibody Negative (07/16   Thyroid disease     Past Surgical History:  Procedure Laterality Date   TOOTH EXTRACTION     WRIST SURGERY  08/30/2020    Family History  Problem Relation Age of Onset   Asthma Father    Diabetes Maternal Grandmother    Hyperlipidemia Paternal Grandfather    Heart disease Paternal Grandfather    Cancer - Colon Maternal Grandfather    Mental illness Neg Hx    Birth defects  Neg Hx    Kidney disease Neg Hx    Hypertension Neg Hx     Social History   Tobacco Use   Smoking status: Never   Smokeless tobacco: Never  Vaping Use   Vaping Use: Never used  Substance Use Topics   Alcohol use: No    Alcohol/week: 0.0 standard drinks   Drug use: No    Allergies:  Allergies  Allergen Reactions   Azithromycin Diarrhea    Diarrhea, nausea/vomiting, IV burns vein Diarrhea, nausea/vomiting, IV burns vein    Medications Prior to Admission  Medication Sig Dispense Refill Last Dose   Blood Pressure Monitoring DEVI 1 each by Does not apply route once a week. (Patient not taking: No sig reported) 1 each 0     Review of Systems  Constitutional:  Negative for chills and fever.  Gastrointestinal:  Positive for abdominal pain. Negative for nausea and vomiting.  Genitourinary:  Positive for vaginal discharge.  Negative for vaginal bleeding.  Physical Exam   Blood pressure (!) 95/53, pulse 96, temperature 98.3 F (36.8 C), temperature source Oral, resp. rate 18, height $RemoveBe'5\' 3"'ayJTOidKU$  (1.6 m), weight 62.5 kg, currently breastfeeding.  Physical Exam Vitals reviewed. Exam conducted with a chaperone present.  Constitutional:      Appearance: She is well-developed.     Comments: Tearful  HENT:     Head: Normocephalic and atraumatic.  Pulmonary:     Effort: Pulmonary effort is normal. No respiratory distress.     Breath sounds: Normal breath sounds.  Abdominal:     Palpations: Abdomen is soft.     Tenderness: There is generalized abdominal tenderness.  Genitourinary:    Vagina: Vaginal discharge (Moderate amt gray thin) present.     Cervix: Discharge (Yellowish mucoid) present.     Comments: Sterile Speculum Exam: -Normal External Genitalia: Non tender, small amt grayish discharge noted at introitus.  -Vaginal Vault: Pink mucosa with good rugae. Moderate amt gray discharge -wet prep collected -Cervix:Pink, no lesions, cysts, or polyps.  Appears closed. No active bleeding from os-GC/CT collected. Negative Valsalva. -Bimanual Exam:  Closed Skin:    General: Skin is warm and dry.  Neurological:     Mental Status: She is alert and oriented to person, place, and time.  Psychiatric:        Mood and Affect: Mood normal.        Behavior: Behavior normal.   Fetal Assessment EFM: 150 bpm, Mod Var, -Decels, -Accels TOCO: Q1-26min MAU Course  Procedures Results for orders placed or performed during the hospital encounter of 10/05/21 (from the past 24 hour(s))  Urinalysis, Routine w reflex microscopic Urine, Clean Catch     Status: Abnormal   Collection Time: 10/05/21 10:51 PM  Result Value Ref Range   Color, Urine AMBER (A) YELLOW   APPearance HAZY (A) CLEAR   Specific Gravity, Urine 1.029 1.005 - 1.030   pH 6.0 5.0 - 8.0   Glucose, UA NEGATIVE NEGATIVE mg/dL   Hgb urine dipstick NEGATIVE NEGATIVE    Bilirubin Urine NEGATIVE NEGATIVE   Ketones, ur 5 (A) NEGATIVE mg/dL   Protein, ur 30 (A) NEGATIVE mg/dL   Nitrite NEGATIVE NEGATIVE   Leukocytes,Ua TRACE (A) NEGATIVE   RBC / HPF 0-5 0 - 5 RBC/hpf   WBC, UA 6-10 0 - 5 WBC/hpf   Bacteria, UA RARE (A) NONE SEEN   Squamous Epithelial / LPF 11-20 0 - 5   Mucus PRESENT   Wet prep, genital  Status: Abnormal   Collection Time: 10/05/21 11:46 PM   Specimen: PATH Cytology Cervicovaginal Ancillary Only  Result Value Ref Range   Yeast Wet Prep HPF POC NONE SEEN NONE SEEN   Trich, Wet Prep NONE SEEN NONE SEEN   Clue Cells Wet Prep HPF POC PRESENT (A) NONE SEEN   WBC, Wet Prep HPF POC MANY (A) NONE SEEN   Sperm NONE SEEN     MDM Pelvic Exam: Angela Branch, GC/CT, Wet Prep UA Muscle Relaxant Start IV Fluid Bolus IV Pain Medication Prescription Assessment and Plan  25 year old, [id number]  SIUP at 24 weeks Cat I FT Contractions Vaginal Leakage  -Reviewed POC with patient. -Exam performed and findings discussed.  -Informed that findings are not suggestive of ROM, but BV is highly likely. -Wet prep and GC/CT as well as fern collected. -Fern negative. -Will give flexeril $RemoveBefor'5mg'qwyPRzaBlpKF$  for pain. -Will await other results.    Angela Branch 10/05/2021, 11:43 PM   Reassessment (12:34 AM)  -Results return as above. -Provider to bedside to discuss. -Patient reports pain remains and no relief with flexeril dosing. -Informed that urine shows some bacteria and will send for culture. -Ctx palpated and discussed giving IV fluids. Patient agreeable. -Will also give IV Morphine $RemoveBef'2mg'JkzaoYeHVX$  now. -Discussed BV diagnosis and treatment with metrogel.  Patient agreeable. -Will discontinue EFM. Variables noted, but patient informed that this is not uncommon at this [place] -Will monitor and reassess.   Reassessment (1:53 AM)  -Patient reports pain improving and now 5/10. -Nurse instructed to give precautions and discharge. -Encouraged to call primary office or return  to MAU if symptoms worsen or with the onset of new symptoms.  Angela Conners MSN, CNM Advanced Practice Provider, [facility]

## 2021-10-05 NOTE — MAU Note (Signed)
PT SAYS SHE HAD AN U/S AND AMNIO ON Monday- TO CHECK POSITION AND SHE'S A CARRIER OF MS  SINCE THEN SHE THINKS SHE'S BEEN LEAKING FLUID- LESS MOVEMENT, AND FEELS UC'S.  SHE DID NOT CALL OFFICE ABOUT PROBLEMS -

## 2021-10-06 LAB — GENE SPECIFIC SEQUENCING

## 2021-10-06 LAB — WET PREP, GENITAL
Sperm: NONE SEEN
Trich, Wet Prep: NONE SEEN
Yeast Wet Prep HPF POC: NONE SEEN

## 2021-10-06 MED ORDER — LACTATED RINGERS IV BOLUS
1000.0000 mL | Freq: Once | INTRAVENOUS | Status: AC
  Administered 2021-10-06: 1000 mL via INTRAVENOUS

## 2021-10-06 MED ORDER — METRONIDAZOLE 0.75 % VA GEL: 1.0000 | Freq: Every day | VAGINAL | 0 refills | Status: DC

## 2021-10-06 MED ORDER — MORPHINE SULFATE (PF) 4 MG/ML IV SOLN
2.0000 mg | Freq: Once | INTRAVENOUS | Status: AC
  Administered 2021-10-06: 2 mg via INTRAVENOUS
  Filled 2021-10-06: qty 1

## 2021-10-07 LAB — CULTURE, OB URINE: Culture: <10000 — AB

## 2021-10-08 ENCOUNTER — Encounter: Payer: Self-pay | Admitting: *Deleted

## 2021-10-08 LAB — MCC TRACKING

## 2021-10-08 LAB — GC/CHLAMYDIA PROBE AMP (~~LOC~~) NOT AT ARMC
Chlamydia: NEGATIVE
Comment: NEGATIVE
Comment: NORMAL
Neisseria Gonorrhea: NEGATIVE

## 2021-10-11 ENCOUNTER — Telehealth: Payer: Self-pay | Admitting: Student

## 2021-10-11 ENCOUNTER — Other Ambulatory Visit: Payer: Self-pay | Admitting: Student

## 2021-10-11 MED ORDER — HYDROXYZINE PAMOATE 25 MG PO CAPS: 25.0000 mg | ORAL_CAPSULE | Freq: Every evening | ORAL | 0 refills | Status: DC

## 2021-10-11 NOTE — Telephone Encounter (Signed)
TC to patient to discuss her concerns about sleep; patient states that she was up 4 times last night and that lack of sleep is beginning to affect her mental health. Patient is willing to try Vistaril. Discussed that she should start with 1/2 tablet tonight to see how it affects her; do not drive after taking medicine. She requested RX for Ambien but I suggested that we try vistaril first. Patient agreed with this plan.   Maye Hides

## 2021-10-15 ENCOUNTER — Telehealth: Payer: Self-pay | Admitting: Obstetrics and Gynecology

## 2021-10-15 LAB — AFP, AMNIOTIC FLUID
AFP, Amniotic Fluid (mcg/ml): 1.9 ug/mL
Gestational Age(Wks): 22
MOM, Amniotic Fluid: 0.41

## 2021-10-15 NOTE — Telephone Encounter (Signed)
I spoke with Angela Branch to review the results of testing for muscular dystrophy on her recent amniocentesis.  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  The patient expressed that she plans to continue the pregnancy.  She also inquired about getting follow-up appointments through the [place] clinic.  She reported that [name] had been referred there on 2 prior occasions but they never received a phone call to schedule an appointment.  I have reached out via email to a genetic counselor at [facility] to find out how we may help getting this family seen in that comprehensive clinic.  [name] also had questions for future pregnancies about the option of preimplantation genetic testing which we reviewed.  I am happy to speak with her further about this option and provide contact information to an appropriate clinic at anytime in the future.  She also had additional questions about recurrence risks, which we discussed.  I will follow-up with [name] when I hear more back from [facility].  I may be reached at [phone].  Wilburt Finlay, MS, CGC

## 2021-10-16 LAB — DIRECT PRENATAL SNP CMA

## 2021-10-16 LAB — CHROMOSOME, AMNIOTIC FLUID
Cells Analyzed: 15
Cells Counted: 15
Cells Karyotyped: 2
Colonies: 15
GTG Band Resolution Achieved: 450

## 2021-10-16 LAB — SPECIMEN STATUS REPORT

## 2021-10-16 LAB — MATERNAL CELL CONTAMINATION

## 2021-10-18 ENCOUNTER — Telehealth: Payer: Self-pay | Admitting: Obstetrics and Gynecology

## 2021-10-18 NOTE — Telephone Encounter (Signed)
I spoke with Ms. Schnee in follow-up to our conversation earlier this week regarding her son's referral to the [facility] clinic.  I emailed Cherlynn Perches with the [facility] clinic and asked that she reach out to Ms. Hitchcock.  There was note in her son Rayetta Humphrey (dob 12/01/2015) chart that [place] had previously tried to contact Ms. Paisley to schedule an appointment but was not able to reach her.  When I spoke with her today I also made sure that she had a contact number for the [facility] clinic, which is [phone].  In my email I also informed Ms. Teachey of the results of the current pregnancy.  I will also include this note in Jalen's chart, which I made the patient aware of. If I may be of further assistance, please let me know.  Wilburt Finlay, MS, CGC

## 2021-10-19 ENCOUNTER — Other Ambulatory Visit: Payer: Self-pay

## 2021-10-24 ENCOUNTER — Ambulatory Visit (INDEPENDENT_AMBULATORY_CARE_PROVIDER_SITE_OTHER): Payer: Medicaid Other | Admitting: Cardiology

## 2021-10-24 ENCOUNTER — Other Ambulatory Visit: Payer: Self-pay

## 2021-10-24 ENCOUNTER — Encounter: Payer: Self-pay | Admitting: Cardiology

## 2021-10-24 VITALS — BP 110/64 | HR 86 | Ht 63.0 in | Wt 147.2 lb

## 2021-10-24 DIAGNOSIS — Z148 Genetic carrier of other disease: Secondary | ICD-10-CM

## 2021-10-24 DIAGNOSIS — R002 Palpitations: Secondary | ICD-10-CM | POA: Diagnosis not present

## 2021-10-24 DIAGNOSIS — R55 Syncope and collapse: Secondary | ICD-10-CM

## 2021-10-24 NOTE — Addendum Note (Signed)
Addended by: Antonieta Iba on: 10/24/2021 01:38 PM   Modules accepted: Orders

## 2021-10-24 NOTE — Progress Notes (Signed)
Cardiology Office Note    Date:  10/24/2021   ID:  Angela Branch, DOB Jan 25, 1996, MRN [id number]  PCP:  Pcp, No  Cardiologist:  Fransico Him, MD   Chief Complaint  Patient presents with   Follow-up    Duchenne's muscular dystrophy, palpitations     History of Present Illness:  Angela Branch is a 25 y.o. 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  Her great uncle has fully expressed Duchenne's but no other family members.   She is here today for followup and is doing well.  She is [redacted] weeks pregnant.  She continues to have random sharp CP in her chest lasting a few seconds and has not change.  It resolves if she repositions herself.   She has intermittent SOB that she has had since she started coming here that has gotten worse with pregnancy.  She continues to have episodes of palpitation that are sporadic but once daily it will flip flop for a minute and then stops. She denies any PND, orthopnea, LE edema, dizziness .  She did have an episode of syncope about a month ago and it was felt that she was dehydrated. She is compliant with her meds and is tolerating meds with no SE.     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  Flu Vaccine   Genetic Screen  NIPS: low risk female      TDaP vaccine    Hgb A1C or  GTT  Third trimester  Rhogam  n/a   LAB RESULTS  Feeding Plan Breast/Bottle Blood Type A/Positive/-- (07/16 1206)  Contraception undecided Antibody Negative (07/16   Thyroid disease     Past Surgical History:  Procedure Laterality Date   TOOTH EXTRACTION     WRIST SURGERY  08/30/2020    Current Medications: No outpatient medications have been marked as taking for the 10/24/21 encounter (Office Visit) with Angela Margarita, MD.    Allergies:   Azithromycin   Social History   Socioeconomic History   Marital status: Single    Spouse name: Not on file   Number of children: Not on file   Years of education: 9th   Highest education level: Not on file  Occupational History    Employer: UNEMPLOYED  Tobacco Use   Smoking status: Never   Smokeless tobacco: Never  Vaping Use   Vaping Use: Never used  Substance and Sexual Activity  Alcohol use: No    Alcohol/week: 0.0 standard drinks   Drug use: No   Sexual activity: Yes    Birth control/protection: None  Other Topics Concern   Not on file  Social History Narrative   Not on file   Social Determinants of Health   Financial Resource Strain: Not on file  Food Insecurity: No Food Insecurity   Worried About Running Out of Food in the Last Year: Never true   Ran Out of Food in the Last Year: Never true  Transportation Needs: No Transportation Needs   Lack of Transportation (Medical): No   Lack of Transportation (Non-Medical): No  Physical Activity: Not on file  Stress: Not on file  Social Connections: Not on file     Family History:  The patient's family history includes Asthma in her father; Cancer - Colon in her maternal  grandfather; Diabetes in her maternal grandmother; Heart disease in her paternal grandfather; Hyperlipidemia in her paternal grandfather.   ROS:   Please see the history of present illness.    ROS All other systems reviewed and are negative.  No flowsheet data found.     PHYSICAL EXAM:   VS:  BP 110/64   Pulse 86   Ht $R'5\' 3"'NZ$  (1.6 m)   Wt 147 lb 3.2 oz (66.8 kg)   SpO2 99%   BMI 26.08 kg/m    GEN: Well nourished, well developed in no acute distress HEENT: Normal NECK: No JVD; No carotid bruits LYMPHATICS: No lymphadenopathy CARDIAC:RRR, no murmurs, rubs, gallops RESPIRATORY:  Clear to auscultation without rales, wheezing or rhonchi  ABDOMEN: Soft, non-tender, non-distended MUSCULOSKELETAL:  No edema; No deformity  SKIN: Warm and dry NEUROLOGIC:  Alert and oriented x 3 PSYCHIATRIC:  Normal affect    Wt Readings from Last 3 Encounters:  10/24/21 147 lb 3.2 oz (66.8 kg)  10/05/21 137 lb 11.2 oz (62.5 kg)  09/20/21 141 lb 3.2 oz (64 kg)      Studies/Labs Reviewed:   EKG:  EKG is not ordered today   Recent Labs: 06/10/2021: Magnesium 2.1 09/06/2021: ALT 11; BUN 6; Creatinine, Ser 0.67; Hemoglobin 10.8; Platelets 187; Potassium 3.1; Sodium 135; TSH 0.626   Lipid Panel No results found for: CHOL, TRIG, HDL, CHOLHDL, VLDL, LDLCALC, LDLDIRECT  Additional studies/ records that were reviewed today include:  Office notes from OB    ASSESSMENT:    1. 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 Syncope, unspecified syncope type   3. Palpitations      PLAN:  In order of problems listed above:  Carrier of Duchenne's muscular dystrophy -EF by echo 09/2019 was 60-65% -Cardiac MRI 12/04/2020 showed mildly dilated LV with normal LV systolic function EF 80% with no late gadolinium enhancement of the LV, normal RV size and function, normal biatrial size, mild MR and trivial TR and trivial pericardial effusion -Repeat limited 2D echo  to assess LV function due to increased  DOE  2.  Syncope -vasovagal in origin and both times occurred while pregnant and after going to the bathroom -she gives a hx of syncope while getting nipples pierced and said she passed out because of the pain -she had another episode of syncope a few weeks ago but was attributed to dehydration in setting of pregnancy -encouraged her to stay hydrated -no arrhythmias on heart monitor in the past but she only wore it for 2 days due to being allergic to the EKG leads  3.  Palpitations -she is very allergic  to patch that she wore for the [employer]  -she is now having palpitations again -I will get a 30 day event monitor  4.  SOB -likely related to pregnancy -given her Duchennes dystrophy will get a 2D echo to make sure LVF is preserved -Hbg 10.8 and TSH 0.628 in Aug 2022 -I will check a CBC and TSH  Medication Adjustments/Labs and Tests Ordered: Current medicines are reviewed at length with the patient today.  Concerns regarding medicines are outlined above.  Medication changes, Labs and Tests ordered today are listed in the Patient Instructions below.  There are no Patient Instructions on file for this visit.   Signed, Fransico Him, MD  10/24/2021 1:27 PM    [facility] [address], [place], [place]  [address] Phone: [phone]; Fax: [phone]

## 2021-10-24 NOTE — Patient Instructions (Addendum)
Medication Instructions:  Your physician recommends that you continue on your current medications as directed. Please refer to the Current Medication list given to you today.  *If you need a refill on your cardiac medications before your next appointment, please call your pharmacy*  Labs: TODAY: CBC and TSH  Testing/Procedures: Your physician has requested that you have an echocardiogram. Echocardiography is a painless test that uses sound waves to create images of your heart. It provides your doctor with information about the size and shape of your heart and how well your heart's chambers and valves are working. This procedure takes approximately one hour. There are no restrictions for this procedure.  Your physician has recommended that you wear an event monitor. Event monitors are medical devices that record the heart's electrical activity. Doctors most often [name] these monitors to diagnose arrhythmias. Arrhythmias are problems with the speed or rhythm of the heartbeat. The monitor is a small, portable device. You can wear one while you do your normal daily activities. This is usually used to diagnose what is causing palpitations/syncope (passing out).  Follow-Up: At [facility], you and your health needs are our priority.  As part of our continuing mission to provide you with exceptional heart care, we have created designated Provider Care Teams.  These Care Teams include your primary Cardiologist (physician) and Advanced Practice Providers (APPs -  Physician Assistants and Nurse Practitioners) who all work together to provide you with the care you need, when you need it.   Your next appointment:   1 year(s)  The format for your next appointment:   In Person  Provider:   You may see Fransico Him, MD or one of the following Advanced Practice Providers on your designated Care Team:   Melina Copa, PA-C Ermalinda Barrios, PA-C  [employer] Cardiac Event Monitor Instructions Your physician has  requested you wear your cardiac event monitor for _30_ days. [employer] may call or text to confirm a shipping address. The monitor will be sent to a land address via UPS. [employer] will not ship a monitor to a PO BOX. It typically takes 3-5 days to receive your monitor after it has been enrolled. [employer] will assist with USPS tracking if your package is delayed. The telephone number for [employer] is [phone]. Once you have received your monitor, please review the enclosed instructions. Instruction tutorials can also be viewed under help and settings on the enclosed cell phone. Your monitor has already been registered assigning a specific monitor serial # to you.  Applying the monitor Remove cell phone from case and turn it on. The cell phone works as Dealer and needs to be within [employer] of you at all times. The cell phone will need to be charged on a daily basis. We recommend you plug the cell phone into the enclosed charger at your bedside table every night.  Monitor batteries: You will receive two monitor batteries labelled #1 and #2. These are your recorders. Plug battery #2 onto the second connection on the enclosed charger. Keep one battery on the charger at all times. This will keep the monitor battery deactivated. It will also keep it fully charged for when you need to switch your monitor batteries. A small light will be blinking on the battery emblem when it is charging. The light on the battery emblem will remain on when the battery is fully charged.  Open package of a Monitor strip. Insert battery #1 into black hood on strip and gently squeeze monitor battery  onto connection as indicated in instruction booklet. Set aside while preparing skin.  Choose location for your strip, vertical or horizontal, as indicated in the instruction booklet. Shave to remove all hair from location. There cannot be any lotions, oils, powders, or colognes on skin where monitor is to  be applied. Wipe skin clean with enclosed Saline wipe. Dry skin completely.  Peel paper labeled #1 off the back of the Monitor strip exposing the adhesive. Place the monitor on the chest in the vertical or horizontal position shown in the instruction booklet. One arrow on the monitor strip must be pointing upward. Carefully remove paper labeled #2, attaching remainder of strip to your skin. Try not to create any folds or wrinkles in the strip as you apply it.  Firmly press and release the circle in the center of the monitor battery. You will hear a small beep. This is turning the monitor battery on. The heart emblem on the monitor battery will light up every 5 seconds if the monitor battery in turned on and connected to the patient securely. Do not push and hold the circle down as this turns the monitor battery off. The cell phone will locate the monitor battery. A screen will appear on the cell phone checking the connection of your monitor strip. This may read poor connection initially but change to good connection within the next minute. Once your monitor accepts the connection you will hear a series of 3 beeps followed by a climbing crescendo of beeps. A screen will appear on the cell phone showing the two monitor strip placement options. Touch the picture that demonstrates where you applied the monitor strip.  Your monitor strip and battery are waterproof. You are able to shower, bathe, or swim with the monitor on. They just ask you do not submerge deeper than 3 feet underwater. We recommend removing the monitor if you are swimming in a lake, river, or ocean.  Your monitor battery will need to be switched to a fully charged monitor battery approximately once a week. The cell phone will alert you of an action which needs to be made.  On the cell phone, tap for details to reveal connection status, monitor battery status, and cell phone battery status. The green dots indicates your monitor  is in good status. A red dot indicates there is something that needs your attention.  To record a symptom, click the circle on the monitor battery. In 30-60 seconds a list of symptoms will appear on the cell phone. Select your symptom and tap save. Your monitor will record a sustained or significant arrhythmia regardless of you clicking the button. Some patients do not feel the heart rhythm irregularities. [employer] will notify us of any serious or critical events.  Refer to instruction booklet for instructions on switching batteries, changing strips, the Do not disturb or Pause features, or any additional questions.  Call [employer] at [phone], to confirm your monitor is transmitting and record your baseline. They will answer any questions you may have regarding the monitor instructions at that time.  Returning the monitor to [place] all equipment back into blue box. Peel off strip of paper to expose adhesive and close box securely. There is a prepaid UPS shipping label on this box. Drop in a UPS drop box, or at a UPS facility like [employer]. You may also contact [employer] to arrange UPS to pick up monitor package at your home.

## 2021-10-29 ENCOUNTER — Other Ambulatory Visit: Payer: Self-pay | Admitting: *Deleted

## 2021-10-29 DIAGNOSIS — Z3480 Encounter for supervision of other normal pregnancy, unspecified trimester: Secondary | ICD-10-CM

## 2021-11-02 ENCOUNTER — Other Ambulatory Visit: Payer: Self-pay | Admitting: Student

## 2021-11-02 DIAGNOSIS — E079 Disorder of thyroid, unspecified: Secondary | ICD-10-CM

## 2021-11-04 ENCOUNTER — Ambulatory Visit (INDEPENDENT_AMBULATORY_CARE_PROVIDER_SITE_OTHER): Payer: Medicaid Other

## 2021-11-04 DIAGNOSIS — R002 Palpitations: Secondary | ICD-10-CM

## 2021-11-04 DIAGNOSIS — R55 Syncope and collapse: Secondary | ICD-10-CM | POA: Diagnosis not present

## 2021-11-04 DIAGNOSIS — Z148 Genetic carrier of other disease: Secondary | ICD-10-CM | POA: Diagnosis not present

## 2021-11-05 ENCOUNTER — Ambulatory Visit (INDEPENDENT_AMBULATORY_CARE_PROVIDER_SITE_OTHER): Payer: Medicaid Other | Admitting: Student

## 2021-11-05 ENCOUNTER — Other Ambulatory Visit: Payer: Self-pay

## 2021-11-05 ENCOUNTER — Other Ambulatory Visit: Payer: Medicaid Other

## 2021-11-05 VITALS — BP 111/69 | HR 85 | Wt 148.3 lb

## 2021-11-05 DIAGNOSIS — Z3A27 27 weeks gestation of pregnancy: Secondary | ICD-10-CM

## 2021-11-05 DIAGNOSIS — E079 Disorder of thyroid, unspecified: Secondary | ICD-10-CM | POA: Diagnosis not present

## 2021-11-05 DIAGNOSIS — Z3480 Encounter for supervision of other normal pregnancy, unspecified trimester: Secondary | ICD-10-CM | POA: Diagnosis not present

## 2021-11-05 NOTE — Progress Notes (Signed)
Called pt to inform her about next blood draw for 2 gtt. Patient did not answer phone, VM was left.   Zella Richer, [place]   11/05/21  10:24am
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
## 2021-11-06 LAB — RPR: RPR Ser Ql: NONREACTIVE

## 2021-11-06 LAB — CBC
Hematocrit: 33.1 % — ABNORMAL LOW (ref 34.0–46.6)
Hemoglobin: 11.5 g/dL (ref 11.1–15.9)
MCH: 30.7 pg (ref 26.6–33.0)
MCHC: 34.7 g/dL (ref 31.5–35.7)
MCV: 89 fL (ref 79–97)
Platelets: 164 10*3/uL (ref 150–450)
RBC: 3.74 x10E6/uL — ABNORMAL LOW (ref 3.77–5.28)
RDW: 12.2 % (ref 11.7–15.4)
WBC: 9.5 10*3/uL (ref 3.4–10.8)

## 2021-11-06 LAB — HIV ANTIBODY (ROUTINE TESTING W REFLEX): HIV Screen 4th Generation wRfx: NONREACTIVE

## 2021-11-06 LAB — GLUCOSE TOLERANCE, 2 HOURS W/ 1HR
Glucose, 1 hour: 131 mg/dL (ref 70–179)
Glucose, 2 hour: 102 mg/dL (ref 70–152)
Glucose, Fasting: 79 mg/dL (ref 70–91)

## 2021-11-06 LAB — TSH+FREE T4
Free T4: 1.05 ng/dL (ref 0.82–1.77)
TSH: 1.04 u[IU]/mL (ref 0.450–4.500)

## 2021-11-13 ENCOUNTER — Telehealth: Payer: Self-pay | Admitting: Cardiology

## 2021-11-13 ENCOUNTER — Other Ambulatory Visit (HOSPITAL_COMMUNITY): Payer: Medicaid Other

## 2021-11-13 NOTE — Telephone Encounter (Signed)
Pt received her [employer] Monitor 1 week ago, she is unaware of how long she is supposed to wear the monitor. Please advise pt further [phone]

## 2021-11-13 NOTE — Telephone Encounter (Signed)
Spoke with the patient and advised her that Dr. Radford Pax would like for her to wear the heart monitor for 30 days. She states that it did not come with enough strips. I have advised her to reach out to [employer] and they will send her more. Patient verbalized understanding.

## 2021-11-14 ENCOUNTER — Other Ambulatory Visit (HOSPITAL_COMMUNITY): Payer: Medicaid Other

## 2021-11-14 ENCOUNTER — Encounter (HOSPITAL_COMMUNITY): Payer: Self-pay

## 2021-11-14 NOTE — BH Specialist Note (Deleted)
Integrated Behavioral Health via Telemedicine Visit  11/14/2021 OLISA QUESNEL [id number]  Number of [place] visits: 1 Session Start time: 3:45***  Session End time: 4:45*** Total time: {IBH Total Time:21014050}  Referring Provider: *** Patient/Family location: Home*** [facility] Provider location: [facility] [place] [facility]  All persons participating in visit: Patient *** and [facility] ***  Types of Service: {CHL AMB TYPE OF SERVICE:[phone]}  I connected with [name] and/or Nur A Fildes's {family members:20773} via  Telephone or Geologist, engineering  (Video is Tree surgeon) and verified that I am speaking with the correct person using two identifiers. Discussed confidentiality: {YES/NO:21197}  I discussed the limitations of telemedicine and the availability of in person appointments.  Discussed there is a possibility of technology failure and discussed alternative modes of communication if that failure occurs.  I discussed that engaging in this telemedicine visit, they consent to the provision of behavioral healthcare and the services will be billed under their insurance.  Patient and/or legal guardian expressed understanding and consented to Telemedicine visit: {YES/NO:21197}  Presenting Concerns: Patient and/or family reports the following symptoms/concerns: *** Duration of problem: ***; Severity of problem: {Mild/Moderate/Severe:20260}  Patient and/or Family's Strengths/Protective Factors: {CHL AMB BH PROTECTIVE FACTORS:[phone]}  Goals Addressed: Patient will:  Reduce symptoms of: {IBH Symptoms:21014056}   Increase knowledge and/or ability of: {IBH Patient Tools:21014057}   Demonstrate ability to: {IBH Goals:21014053}  Progress towards Goals: {CHL AMB BH PROGRESS TOWARDS GOALS:[phone]}  Interventions: Interventions utilized:  {IBH  Interventions:21014054} Standardized Assessments completed: {IBH Screening Tools:21014051}  Patient and/or Family Response: ***  Assessment: Patient currently experiencing ***.   Patient may benefit from ***.  Plan: Follow up with behavioral health clinician on : *** Behavioral recommendations: *** Referral(s): {IBH Referrals:21014055}  I discussed the assessment and treatment plan with the patient and/or parent/guardian. They were provided an opportunity to ask questions and all were answered. They agreed with the plan and demonstrated an understanding of the instructions.   They were advised to call back or seek an in-person evaluation if the symptoms worsen or if the condition fails to improve as anticipated.  Caroleen Hamman Jonay Hitchcock, LCSW

## 2021-11-17 ENCOUNTER — Encounter (HOSPITAL_COMMUNITY): Payer: Self-pay | Admitting: Obstetrics & Gynecology

## 2021-11-17 ENCOUNTER — Inpatient Hospital Stay (HOSPITAL_COMMUNITY)
Admission: AD | Admit: 2021-11-17 | Discharge: 2021-11-18 | Disposition: A | Discharge status: Home / Self Care | Payer: Medicaid Other | Attending: Obstetrics & Gynecology | Admitting: Obstetrics & Gynecology

## 2021-11-17 ENCOUNTER — Other Ambulatory Visit: Payer: Self-pay

## 2021-11-17 DIAGNOSIS — O4700 False labor before 37 completed weeks of gestation, unspecified trimester: Secondary | ICD-10-CM

## 2021-11-17 DIAGNOSIS — O98313 Other infections with a predominantly sexual mode of transmission complicating pregnancy, third trimester: Secondary | ICD-10-CM | POA: Insufficient documentation

## 2021-11-17 DIAGNOSIS — A6000 Herpesviral infection of urogenital system, unspecified: Secondary | ICD-10-CM | POA: Insufficient documentation

## 2021-11-17 DIAGNOSIS — Z3A29 29 weeks gestation of pregnancy: Secondary | ICD-10-CM | POA: Insufficient documentation

## 2021-11-17 DIAGNOSIS — R109 Unspecified abdominal pain: Secondary | ICD-10-CM | POA: Diagnosis present

## 2021-11-17 DIAGNOSIS — O4703 False labor before 37 completed weeks of gestation, third trimester: Secondary | ICD-10-CM | POA: Diagnosis not present

## 2021-11-17 LAB — URINALYSIS, ROUTINE W REFLEX MICROSCOPIC
Bacteria, UA: NONE SEEN
Bilirubin Urine: NEGATIVE
Glucose, UA: NEGATIVE mg/dL
Hgb urine dipstick: NEGATIVE
Ketones, ur: 5 mg/dL — AB
Nitrite: NEGATIVE
Protein, ur: NEGATIVE mg/dL
Specific Gravity, Urine: 1.029 (ref 1.005–1.030)
pH: 6 (ref 5.0–8.0)

## 2021-11-17 NOTE — MAU Provider Note (Signed)
History    [id number]  Arrival date and time: 11/17/21 2200   Chief Complaint  Patient presents with   Abdominal Pain   Vaginal Discharge   HPI HENNESSEY CANTRELL is a 25 y.o. at [redacted]w[redacted]d who presents to MAU with vaginal discharge, vaginal pain, and abdominal pain. Patient states that her symptoms started yesterday when she noticed clear/yellow discharge that was leaking out. She also noticed a hint of pink tinged discharge. She then started having lower abdominal pain and pressure like contractions. She feels that the vaginal area is inflamed and swollen. It hurts so much that she is afraid to urinate since it burns so badly when her urine hits the skin. She denies any new sexual partners or recent intercourse. She denies nausea/vomiting. No fevers. She has not noticed blood in her urine.   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   Needs genetic counseling   Eczema 10/04/2014   Faintness 10/05/2015   Family dynamics problem 03/25/2013   Fetal tachycardia affecting management of mother 09/21/2018   GBS (group B Streptococcus carrier), +RV culture, currently pregnant 11/14/2015   GERD (gastroesophageal reflux disease) 09/21/2015   H/O scabies 11/14/2015   Treated with permetherin    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   Loss of weight 03/25/2013   Low back pain 09/20/2013   Menorrhagia 09/28/2013   Migraine headache 02/23/2014   Sleep apnea 04/14/2014   Substance abuse (HCC) 03/24/2015   + cannabis    Supervision of low-risk pregnancy  06/28/2019    Nursing Staff Provider Office Location  [facility] Dating   early u/s Language  English Anatomy US   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  Flu Vaccine   Genetic Screen  NIPS: low risk female      TDaP vaccine    Hgb A1C or  GTT  Third trimester  Rhogam  n/a   LAB RESULTS  Feeding Plan Breast/Bottle Blood Type A/Positive/-- (07/16 1206)  Contraception undecided Antibody Negative (07/16   Thyroid disease     Past Surgical History:  Procedure Laterality Date   TOOTH EXTRACTION     WRIST SURGERY  08/30/2020    Family History  Problem Relation Age of Onset   Asthma Father    Diabetes Maternal Grandmother    Hyperlipidemia Paternal Grandfather    Heart disease Paternal Grandfather    Cancer - Colon Maternal Grandfather    Mental illness Neg Hx    Birth defects Neg Hx    Kidney disease Neg Hx    Hypertension Neg Hx     Social History   Socioeconomic History   Marital status: Single    Spouse name: Not on file   Number of children: Not on file   Years of education: 9th  Highest education level: Not on file  Occupational History    Employer: UNEMPLOYED  Tobacco Use   Smoking status: Never   Smokeless tobacco: Never  Vaping Use   Vaping Use: Never used  Substance and Sexual Activity   Alcohol use: No    Alcohol/week: 0.0 standard drinks   Drug use: No   Sexual activity: Not Currently    Birth control/protection: None  Other Topics Concern   Not on file  Social History Narrative   Not on file   Social Determinants of Health   Financial Resource Strain: Not on file  Food Insecurity: No Food Insecurity   Worried About Running Out of Food in the Last Year: Never true   Ran Out of Food in the Last Year: Never true  Transportation Needs: No Transportation Needs   Lack of Transportation (Medical): No   Lack of Transportation (Non-Medical): No  Physical Activity: Not on file  Stress: Not on file  Social Connections: Not on file  Intimate Partner  Violence: Not on file    Allergies  Allergen Reactions   Azithromycin Diarrhea    Diarrhea, nausea/vomiting, IV burns vein Diarrhea, nausea/vomiting, IV burns vein    No current facility-administered medications on file prior to encounter.   Current Outpatient Medications on File Prior to Encounter  Medication Sig Dispense Refill   Blood Pressure Monitoring DEVI 1 each by Does not apply route once a week. (Patient not taking: No sig reported) 1 each 0   hydrOXYzine (VISTARIL) 25 MG capsule Take 1 capsule (25 mg total) by mouth at bedtime. DO not drive after taking this medicine 30 capsule 0   metroNIDAZOLE (METROGEL VAGINAL) 0.75 % vaginal gel Place 1 Applicatorful vaginally at bedtime. Insert one applicator, at bedtime, for 5 nights. (Patient not taking: Reported on 10/24/2021) 70 g 0     ROS Pertinent positives and negative per HPI, all others reviewed and negative.  Physical Exam   BP 115/63 (BP Location: Right Arm)   Pulse 81   Temp 98 F (36.7 C) (Oral)   Resp 18   SpO2 100%   Physical Exam Exam conducted with a chaperone present.  Constitutional:      General: She is not in acute distress.    Appearance: She is not toxic-appearing.  HENT:     Head: Normocephalic and atraumatic.  Eyes:     General: No scleral icterus.    Extraocular Movements: Extraocular movements intact.  Cardiovascular:     Rate and Rhythm: Normal rate.  Pulmonary:     Effort: Pulmonary effort is normal.  Abdominal:     Palpations: Abdomen is soft.     Tenderness: There is no abdominal tenderness.     Comments: Gravid  Genitourinary:    Comments: Ulcerated lesions noted bilaterally on superior aspect of labia minora, yellow/milky discharge present in vaginal vault, unable to fully visualized cervix. SVE performed and cervix 0.5/thick/ballotable. Musculoskeletal:        General: Normal range of motion.     Right lower leg: No edema.     Left lower leg: No edema.  Skin:    General: Skin  is warm and dry.  Neurological:     General: No focal deficit present.     Mental Status: She is alert and oriented to person, place, and time.  Psychiatric:        Mood and Affect: Mood normal.        Behavior: Behavior normal.   Cervical Exam Dilation:  Fingertip Effacement (%): Thick  FHT Baseline 135 bpm, moderate variability, 15 x 15 accels, no decels Toco: Contractions every 4 minutes Cat: 1; reactive  Labs Results for orders placed or performed during the hospital encounter of 11/17/21 (from the past 24 hour(s))  Urinalysis, Routine w reflex microscopic Urine, In & Out Cath     Status: Abnormal   Collection Time: 11/17/21 11:23 PM  Result Value Ref Range   Color, Urine YELLOW YELLOW   APPearance CLEAR CLEAR   Specific Gravity, Urine 1.029 1.005 - 1.030   pH 6.0 5.0 - 8.0   Glucose, UA NEGATIVE NEGATIVE mg/dL   Hgb urine dipstick NEGATIVE NEGATIVE   Bilirubin Urine NEGATIVE NEGATIVE   Ketones, ur 5 (A) NEGATIVE mg/dL   Protein, ur NEGATIVE NEGATIVE mg/dL   Nitrite NEGATIVE NEGATIVE   Leukocytes,Ua TRACE (A) NEGATIVE   RBC / HPF 0-5 0 - 5 RBC/hpf   WBC, UA 6-10 0 - 5 WBC/hpf   Bacteria, UA NONE SEEN NONE SEEN   Squamous Epithelial / LPF 0-5 0 - 5   Mucus PRESENT   Wet prep, genital     Status: Abnormal   Collection Time: 11/17/21 11:53 PM   Specimen: Cervix  Result Value Ref Range   Yeast Wet Prep HPF POC NONE SEEN NONE SEEN   Trich, Wet Prep NONE SEEN NONE SEEN   Clue Cells Wet Prep HPF POC NONE SEEN NONE SEEN   WBC, Wet Prep HPF POC >=10 (A) <10   Sperm NONE SEEN     Imaging No results found.  MAU Course  Procedures Lab Orders         Wet prep, genital         Urinalysis, Routine w reflex microscopic Urine, In & Out Cath     Meds ordered this encounter  Medications   lidocaine (XYLOCAINE) 2 % jelly 1 application   cyclobenzaprine (FLEXERIL) tablet 5 mg   terbutaline (BRETHINE) injection 0.25 mg   oxyCODONE-acetaminophen (PERCOCET/ROXICET) 5-325 MG  per tablet 1 tablet   ondansetron (ZOFRAN-ODT) disintegrating tablet 4 mg   NIFEdipine (PROCARDIA) 20 MG capsule    Sig: Take 1 capsule (20 mg total) by mouth every 6 (six) hours as needed (contractions).    Dispense:  20 capsule    Refill:  0   valACYclovir (VALTREX) 500 MG tablet    Sig: Take 1 tablet (500 mg total) by mouth 2 (two) times daily for 3 days.    Dispense:  6 tablet    Refill:  0   lidocaine (XYLOCAINE) 2 % jelly    Sig: Apply 1 application topically as needed (genital pain/burning).    Dispense:  30 mL    Refill:  1   Imaging Orders  No imaging studies ordered today   MDM Patient presents with 1 day of vaginal discharge, vaginal pain, and contractions Upon arrival patient is HDS, no acute distress Exam notable for ulcerated lesions on labia minora Hx of HSV; likely recurrent infection Vaginal discharge also present UA noninfectious appearing  Wet prep negative GC/CT pending  Cervix 0.5/thick/ballotable Lidocaine jelly applied with improvement in vaginal pain/burning Able to void without difficulty in MAU afterwards Patient continued to have contraction pain; tocometer with contractions every 4 minutes  Likely exacerbated by acute HSV infection  Given Flexeril without significant improvement  SVE recheck after 3 hours the same  Given Terbutaline and Percocet with improvement in symptoms Cat 1/reactive NST Stable for discharge  Assessment and Plan  1. Recurrent genital HSV (herpes simplex virus) infection - Prescribed Valtrex for acute recurrent infection in addition to lidocaine jelly for symptomatic relief  - Counseled on time course of symptoms and advised to return if symptoms worsen  2. Preterm contractions - Improved with treatments above - Likely triggered by acute infection - UA noninfectious appearing, wet prep negative - Will follow up GC/CT results  - Prescribed Procardia 20 mg PRN for contractions upon discharge - Strict return precautions  reviewed should contraction pain worsen, become more frequent, or if associated with bleeding, LOF, or other obstetric complaint - Patient voiced understanding and is agreeable to plan  Genia Del, MD

## 2021-11-17 NOTE — MAU Note (Signed)
Pt reports to MAU c/o constant discharge/leaking of fluid that started yesterday. Pt states discharge is clear with a yellow tent. Pt reports using at least 10 panty liners since yesterday. Pt reports noticing spotting today as well. Pt also reports constant lower abdominal cramping and pelvic pressure. Pt rates her pain 7/10. Pt states she feels burning while she urinates. Pt states she is unable to urinate currently. Pt reports taking extra strength tylenol $RemoveBeforeDE'500mg'[id number]$  at 2140.

## 2021-11-18 LAB — WET PREP, GENITAL
Clue Cells Wet Prep HPF POC: NONE SEEN
Sperm: NONE SEEN
Trich, Wet Prep: NONE SEEN
WBC, Wet Prep HPF POC: >=10 — AB (ref <10)
Yeast Wet Prep HPF POC: NONE SEEN

## 2021-11-18 MED ORDER — TERBUTALINE SULFATE 1 MG/ML IJ SOLN
0.2500 mg | Freq: Once | INTRAMUSCULAR | Status: AC
  Administered 2021-11-18: 04:00:00 0.25 mg via SUBCUTANEOUS
  Filled 2021-11-18: qty 1

## 2021-11-18 MED ORDER — LIDOCAINE HCL URETHRAL/MUCOSAL 2 % EX GEL
1.0000 "application " | Freq: Once | CUTANEOUS | Status: AC
  Administered 2021-11-18: 1 via TOPICAL
  Filled 2021-11-18: qty 6

## 2021-11-18 MED ORDER — NIFEDIPINE 20 MG PO CAPS
20.0000 mg | ORAL_CAPSULE | Freq: Four times a day (QID) | ORAL | 0 refills | Status: DC | PRN
Start: 2021-11-18 — End: 2021-12-19

## 2021-11-18 MED ORDER — VALACYCLOVIR HCL 500 MG PO TABS: 500.0000 mg | ORAL_TABLET | Freq: Two times a day (BID) | ORAL | 0 refills | Status: AC

## 2021-11-18 MED ORDER — OXYCODONE-ACETAMINOPHEN 5-325 MG PO TABS
1.0000 | ORAL_TABLET | Freq: Once | ORAL | Status: AC
  Administered 2021-11-18: 04:00:00 1 via ORAL
  Filled 2021-11-18: qty 1

## 2021-11-18 MED ORDER — ONDANSETRON 4 MG PO TBDP
4.0000 mg | ORAL_TABLET | Freq: Once | ORAL | Status: AC
Start: 2021-11-18 — End: 2021-11-18
  Administered 2021-11-18: 04:00:00 4 mg via ORAL
  Filled 2021-11-18: qty 1

## 2021-11-18 MED ORDER — LIDOCAINE HCL URETHRAL/MUCOSAL 2 % EX GEL: 1.0000 "application " | CUTANEOUS | 1 refills | Status: DC | PRN

## 2021-11-18 MED ORDER — CYCLOBENZAPRINE HCL 5 MG PO TABS
5.0000 mg | ORAL_TABLET | Freq: Once | ORAL | Status: AC
  Administered 2021-11-18: 02:00:00 5 mg via ORAL
  Filled 2021-11-18: qty 1

## 2021-11-19 ENCOUNTER — Encounter (HOSPITAL_COMMUNITY): Payer: Self-pay | Admitting: Cardiology

## 2021-11-19 LAB — GC/CHLAMYDIA PROBE AMP (~~LOC~~) NOT AT ARMC
Chlamydia: NEGATIVE
Comment: NEGATIVE
Comment: NORMAL
Neisseria Gonorrhea: NEGATIVE

## 2021-11-27 ENCOUNTER — Encounter: Payer: Self-pay | Admitting: Advanced Practice Midwife

## 2021-11-27 ENCOUNTER — Other Ambulatory Visit: Payer: Self-pay

## 2021-11-27 ENCOUNTER — Ambulatory Visit (INDEPENDENT_AMBULATORY_CARE_PROVIDER_SITE_OTHER): Payer: Medicaid Other | Admitting: Advanced Practice Midwife

## 2021-11-27 ENCOUNTER — Encounter: Payer: Medicaid Other | Admitting: Advanced Practice Midwife

## 2021-11-27 VITALS — BP 110/66 | HR 90 | Wt 147.0 lb

## 2021-11-27 DIAGNOSIS — Z3A30 30 weeks gestation of pregnancy: Secondary | ICD-10-CM

## 2021-11-27 DIAGNOSIS — Z3480 Encounter for supervision of other normal pregnancy, unspecified trimester: Secondary | ICD-10-CM
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
## 2021-11-30 NOTE — BH Specialist Note (Signed)
Integrated Behavioral Health via Telemedicine Visit  11/30/2021 [name] [id number]  Number of [place] visits: 1 Session Start time: 4:33  Session End time: 5:29 Total time:  39  Referring Provider: Marcille Buffy, CNM Patient/Family location: Home [facility] Provider location: Center for [place] [facility]  All persons participating in visit: [name] and [place]   Types of Service: Individual psychotherapy and Video visit  I connected with Angela Branch and/or Angela Branch's  n/a  via  Telephone or Geologist, engineering  (Video is Tree surgeon) and verified that I am speaking with the correct person using two identifiers. Discussed confidentiality: Yes   I discussed the limitations of telemedicine and the availability of in person appointments.  Discussed there is a possibility of technology failure and discussed alternative modes of communication if that failure occurs.  I discussed that engaging in this telemedicine visit, they consent to the provision of behavioral healthcare and the services will be billed under their insurance.  Patient and/or legal guardian expressed understanding and consented to Telemedicine visit: Yes   Presenting Concerns: Patient and/or family reports the following symptoms/concerns: Feelings of depression and anxiety; life stress; open to implementing self-coping strategy Duration of problem: Increase current pregnancy; ongoing; Severity of problem: moderate  Patient and/or Family's Strengths/Protective Factors: Concrete supports in place (healthy food, safe environments, etc.) and Sense of purpose  Goals Addressed: Patient will:  Reduce symptoms of: anxiety, depression, and stress   Increase knowledge and/or ability of: coping skills and healthy habits   Demonstrate ability to: Increase healthy adjustment to current life circumstances  and Increase adequate support systems for patient/family  Progress towards Goals: Ongoing  Interventions: Interventions utilized:  Mindfulness or Psychologist, educational, Psychoeducation and/or Health Education, and Link to [employer] Standardized Assessments completed:  Not given today  Patient and/or Family Response: Pt agrees with treatment plan  Assessment: Patient currently experiencing Other specified counseling and Psychosocial stress.   Patient may benefit from psychoeducation and brief therapeutic interventions regarding coping with symptoms of depression, anxiety, life stress .  Plan: Follow up with behavioral health clinician on : Three weeks Behavioral recommendations:  -Continue taking prenatal vitamin daily -Consider CALM relaxation breathing exercise twice daily (morning; at bedtime) -Consider additional information on After Visit Summary, as discussed  Referral(s): [place] (In Clinic) and Community Resources:  etcetera  I discussed the assessment and treatment plan with the patient and/or parent/guardian. They were provided an opportunity to ask questions and all were answered. They agreed with the plan and demonstrated an understanding of the instructions.   They were advised to call back or seek an in-person evaluation if the symptoms worsen or if the condition fails to improve as anticipated.  Garlan Fair, LCSW  Depression screen [facility] 2/9 11/27/2021 09/20/2021 04/02/2021 09/18/2020 06/22/2020  Decreased Interest 0 0 0 0 0  Down, Depressed, Hopeless 0 0 0 0 0  PHQ - 2 Score 0 0 0 0 0  Altered sleeping 0 0 0 0 0  Tired, decreased energy 0 0 0 0 0  Change in appetite 0 0 0 0 0  Feeling bad or failure about yourself  0 0 0 0 0  Trouble concentrating 0 0 0 0 0  Moving slowly or fidgety/restless 0 0 0 0 0  Suicidal thoughts 0 0 0 0 0  PHQ-9 Score 0 0 0 0 0  Difficult doing work/chores - -  Not difficult at all - -  Some recent  data might be hidden   GAD 7 : Generalized Anxiety Score 11/27/2021 09/20/2021 04/02/2021 09/18/2020  Nervous, Anxious, on Edge 0 0 0 0  Control/stop worrying 0 0 0 0  Worry too much - different things 0 0 0 0  Trouble relaxing 0 0 0 0  Restless 0 0 0 0  Easily annoyed or irritable 0 0 0 0  Afraid - awful might happen 0 0 0 0  Total GAD 7 Score 0 0 0 0

## 2021-12-03 ENCOUNTER — Other Ambulatory Visit: Payer: Self-pay

## 2021-12-03 ENCOUNTER — Ambulatory Visit: Payer: Medicaid Other | Attending: Obstetrics and Gynecology

## 2021-12-03 ENCOUNTER — Ambulatory Visit: Payer: Medicaid Other

## 2021-12-03 ENCOUNTER — Encounter: Payer: Self-pay | Admitting: *Deleted

## 2021-12-03 ENCOUNTER — Ambulatory Visit: Payer: Medicaid Other | Admitting: *Deleted

## 2021-12-03 ENCOUNTER — Other Ambulatory Visit: Payer: Self-pay | Admitting: *Deleted

## 2021-12-03 VITALS — BP 98/58 | HR 81

## 2021-12-03 DIAGNOSIS — Z3A31 31 weeks gestation of pregnancy: Secondary | ICD-10-CM | POA: Diagnosis not present

## 2021-12-03 DIAGNOSIS — Z3480 Encounter for supervision of other normal pregnancy, unspecified trimester: Secondary | ICD-10-CM

## 2021-12-03 DIAGNOSIS — Z362 Encounter for other antenatal screening follow-up: Secondary | ICD-10-CM | POA: Insufficient documentation

## 2021-12-03 DIAGNOSIS — G7101 Duchenne or Becker muscular dystrophy: Secondary | ICD-10-CM

## 2021-12-04 ENCOUNTER — Encounter: Payer: Medicaid Other | Admitting: Student

## 2021-12-04 ENCOUNTER — Ambulatory Visit (INDEPENDENT_AMBULATORY_CARE_PROVIDER_SITE_OTHER): Payer: Medicaid Other | Admitting: Clinical

## 2021-12-04 DIAGNOSIS — Z7189 Other specified counseling: Secondary | ICD-10-CM | POA: Diagnosis not present

## 2021-12-04 DIAGNOSIS — Z658 Other specified problems related to psychosocial circumstances: Secondary | ICD-10-CM

## 2021-12-04 NOTE — Patient Instructions (Addendum)
Center for [facility] at [facility] [place], [place] [address] [phone] (main office) [phone] ([place] office)  New mom online support groups:  www.conehealthybaby.com www.postpartum.net  Therapist Aid at www.therapistaid.com  LIEAP ([place]) ReportZoo.com.cy  [employer] ([place]) http://cameron-bishop.biz/  /Emotional Wellbeing Apps and Websites Here are a few free apps meant to help you to help yourself.  To find, try searching on the internet to see if the app is offered on Apple/Android devices. If your first choice doesn't come up on your device, the good news is that there are many choices! Play around with different apps to see which ones are helpful to you.    Calm This is an app meant to help increase calm feelings. Includes info, strategies, and tools for tracking your feelings.      Calm Harm  This app is meant to help with self-harm. Provides many 5-minute or 15-min coping strategies for doing instead of hurting yourself.       Deering is a problem-solving tool to help deal with emotions and cope with stress you encounter wherever you are.      MindShift This app can help people cope with anxiety. Rather than trying to avoid anxiety, you can make an important shift and face it.      MY3  MY3 features a support system, safety plan and resources with the goal of offering a tool to use in a time of need.       My Life My Voice  This mood journal offers a simple solution for tracking your thoughts, feelings and moods. Animated emoticons can help identify your mood.       Relax Melodies Designed to help with sleep, on this app you can mix sounds and  meditations for relaxation.      Smiling Mind Smiling Mind is meditation made easy: it's a simple tool that helps put a smile on your mind.        Stop, Breathe & Think  A friendly, simple guide for people through meditations for mindfulness and compassion.  Stop, Breathe and Think Kids Enter your current feelings and choose a mission to help you cope. Offers videos for certain moods instead of just sound recordings.       Team Orange The goal of this tool is to help teens change how they think, act, and react. This app helps you focus on your own good feelings and experiences.      The [place] Box The [place] Box (VHB) contains simple tools to help patients with coping, relaxation, distraction, and positive thinking.

## 2021-12-10 ENCOUNTER — Other Ambulatory Visit: Payer: Medicaid Other

## 2021-12-12 ENCOUNTER — Encounter: Payer: Medicaid Other | Admitting: Nurse Practitioner

## 2021-12-12 ENCOUNTER — Telehealth (INDEPENDENT_AMBULATORY_CARE_PROVIDER_SITE_OTHER): Payer: Medicaid Other | Admitting: Family Medicine

## 2021-12-12 DIAGNOSIS — Z224 Carrier of infections with a predominantly sexual mode of transmission: Secondary | ICD-10-CM

## 2021-12-12 DIAGNOSIS — E0590 Thyrotoxicosis, unspecified without thyrotoxic crisis or storm: Secondary | ICD-10-CM

## 2021-12-12 DIAGNOSIS — O99830 Other infection carrier state complicating pregnancy: Secondary | ICD-10-CM

## 2021-12-12 DIAGNOSIS — Z3480 Encounter for supervision of other normal pregnancy, unspecified trimester: Secondary | ICD-10-CM

## 2021-12-12 DIAGNOSIS — O352XX0 Maternal care for (suspected) hereditary disease in fetus, not applicable or unspecified: Secondary | ICD-10-CM

## 2021-12-12 DIAGNOSIS — Z148 Genetic carrier of other disease: Secondary | ICD-10-CM

## 2021-12-12 DIAGNOSIS — Z3A33 33 weeks gestation of pregnancy: Secondary | ICD-10-CM

## 2021-12-12 DIAGNOSIS — O99283 Endocrine, nutritional and metabolic diseases complicating pregnancy, third trimester: Secondary | ICD-10-CM

## 2021-12-12 NOTE — Progress Notes (Signed)
I connected with  Angela Branch on 12/12/21 at  9:35 AM EST by Mychart Virtual video visit and verified that I am speaking with the correct person using two identifiers.   I discussed the limitations, risks, security and privacy concerns of performing an evaluation and management service by telephone and the availability of in person appointments. I also discussed with the patient that there may be a patient responsible charge related to this service. The patient expressed understanding and agreed to proceed.  Mena Goes, CMA 12/12/2021  9:48 AM
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
## 2021-12-13 ENCOUNTER — Telehealth: Payer: Self-pay

## 2021-12-13 DIAGNOSIS — R002 Palpitations: Secondary | ICD-10-CM

## 2021-12-13 DIAGNOSIS — R55 Syncope and collapse: Secondary | ICD-10-CM

## 2021-12-13 NOTE — Telephone Encounter (Signed)
-----   Message from Sueanne Margarita, MD sent at 12/12/2021  7:01 PM EST ----- Heart monitor showed extra heart beats from the bottom of the heart which were rare and benign.  Her K+ was low last fall.  TSH was normal recently.  Please get  BMET and Mag level.  Try to stay hydrated and notify us if she has further syncope.

## 2021-12-13 NOTE — Telephone Encounter (Signed)
The patient has been notified of the result and verbalized understanding.  All questions (if any) were answered. Antonieta Iba, RN 12/13/2021 2:53 PM  Patient will call back to schedule lab work.

## 2021-12-13 NOTE — BH Specialist Note (Deleted)
Integrated Behavioral Health via Telemedicine Visit  12/13/2021 ELINE GENG [id number]  Number of [place] visits: *** Session Start time: 10:15***  Session End time: 10:45*** Total time: {IBH Total CMKL:[id number]}  Referring Provider: *** Patient/Family location: Home*** [facility] Provider location: Center for [place] at [facility]  All persons participating in visit: Patient *** and [place] ***  Types of Service: {CHL AMB TYPE OF [phone]}  I connected with [name] and/or Anwita A Arvelo's {family members:20773} via  Telephone or Geologist, engineering  (Video is Tree surgeon) and verified that I am speaking with the correct person using two identifiers. Discussed confidentiality: {YES/NO:21197}  I discussed the limitations of telemedicine and the availability of in person appointments.  Discussed there is a possibility of technology failure and discussed alternative modes of communication if that failure occurs.  I discussed that engaging in this telemedicine visit, they consent to the provision of behavioral healthcare and the services will be billed under their insurance.  Patient and/or legal guardian expressed understanding and consented to Telemedicine visit: {YES/NO:21197}  Presenting Concerns: Patient and/or family reports the following symptoms/concerns: *** Duration of problem: ***; Severity of problem: {Mild/Moderate/Severe:20260}  Patient and/or Family's Strengths/Protective Factors: {CHL AMB BH PROTECTIVE FACTORS:[phone]}  Goals Addressed: Patient will:  Reduce symptoms of: {IBH Symptoms:21014056}   Increase knowledge and/or ability of: {IBH Patient Tools:21014057}   Demonstrate ability to: {IBH Goals:21014053}  Progress towards Goals: {CHL AMB BH PROGRESS TOWARDS [phone]}  Interventions: Interventions utilized:  {IBH  Interventions:21014054} Standardized Assessments completed: {IBH Screening Tools:21014051}  Patient and/or Family Response: ***  Assessment: Patient currently experiencing ***.   Patient may benefit from ***.  Plan: Follow up with behavioral health clinician on : *** Behavioral recommendations: *** Referral(s): {IBH Referrals:21014055}  I discussed the assessment and treatment plan with the patient and/or parent/guardian. They were provided an opportunity to ask questions and all were answered. They agreed with the plan and demonstrated an understanding of the instructions.   They were advised to call back or seek an in-person evaluation if the symptoms worsen or if the condition fails to improve as anticipated.  Caroleen Hamman Warrick Llera, LCSW

## 2021-12-14 ENCOUNTER — Encounter: Payer: Self-pay | Admitting: Family Medicine

## 2021-12-19 ENCOUNTER — Ambulatory Visit: Payer: Medicaid Other | Attending: Obstetrics

## 2021-12-19 ENCOUNTER — Ambulatory Visit: Payer: Medicaid Other | Admitting: *Deleted

## 2021-12-19 ENCOUNTER — Other Ambulatory Visit: Payer: Self-pay

## 2021-12-19 VITALS — BP 108/61 | HR 84

## 2021-12-19 DIAGNOSIS — O99283 Endocrine, nutritional and metabolic diseases complicating pregnancy, third trimester: Secondary | ICD-10-CM | POA: Insufficient documentation

## 2021-12-19 DIAGNOSIS — G7101 Duchenne or Becker muscular dystrophy: Secondary | ICD-10-CM | POA: Diagnosis present

## 2021-12-19 DIAGNOSIS — Z3480 Encounter for supervision of other normal pregnancy, unspecified trimester: Secondary | ICD-10-CM

## 2021-12-19 DIAGNOSIS — Z3A34 34 weeks gestation of pregnancy: Secondary | ICD-10-CM | POA: Insufficient documentation

## 2021-12-19 DIAGNOSIS — J45909 Unspecified asthma, uncomplicated: Secondary | ICD-10-CM | POA: Insufficient documentation

## 2021-12-19 DIAGNOSIS — Z148 Genetic carrier of other disease: Secondary | ICD-10-CM | POA: Insufficient documentation

## 2021-12-19 DIAGNOSIS — E0590 Thyrotoxicosis, unspecified without thyrotoxic crisis or storm: Secondary | ICD-10-CM | POA: Diagnosis not present

## 2021-12-19 DIAGNOSIS — Z362 Encounter for other antenatal screening follow-up: Secondary | ICD-10-CM | POA: Diagnosis not present

## 2021-12-19 DIAGNOSIS — O99891 Other specified diseases and conditions complicating pregnancy: Secondary | ICD-10-CM | POA: Insufficient documentation

## 2021-12-23 NOTE — L&D Delivery Note (Signed)
LABOR COURSE Patient was admitted in early labor. She made spontaneous change to 5cm and was subsquently augmented with Pitocin. She was AROM'd at 10/100/0.   Delivery Note Called to room and patient was complete and pushing. Head delivered LOT. Loose nuchal, delivered through. Shoulder and body delivered in usual fashion. At 1554 a viable female was delivered via Vaginal, Spontaneous (Presentation: LOT; LOA).  Infant with spontaneous cry, placed on mother's abdomen, dried and stimulated. Cord clamped x 2 after two-minute delay, and cut by FOB. Cord blood drawn. Placenta delivered spontaneously with gentle cord traction. Appears intact. Fundus firm with massage and Pitocin. Labia, perineum, vagina, and cervix inspected.   APGAR:9,9 ; weight: 3555 g .   Cord: 3VC with the following complications:N/A.    Anesthesia:   Episiotomy: None Lacerations: None Est. Blood Loss (mL): 50  Mom to postpartum.  Baby to Couplet care / Skin to Skin.  Mallie Snooks, CNM 01/17/22 4:06 PM

## 2021-12-26 ENCOUNTER — Encounter: Payer: Medicaid Other | Admitting: Family Medicine

## 2021-12-26 ENCOUNTER — Other Ambulatory Visit: Payer: Medicaid Other

## 2021-12-27 ENCOUNTER — Encounter: Payer: Medicaid Other | Admitting: Clinical

## 2022-01-01 ENCOUNTER — Ambulatory Visit: Payer: Medicaid Other | Admitting: *Deleted

## 2022-01-01 ENCOUNTER — Telehealth: Payer: Self-pay | Admitting: Family Medicine

## 2022-01-01 ENCOUNTER — Ambulatory Visit (INDEPENDENT_AMBULATORY_CARE_PROVIDER_SITE_OTHER): Payer: Medicaid Other | Admitting: Family Medicine

## 2022-01-01 ENCOUNTER — Other Ambulatory Visit (HOSPITAL_COMMUNITY)
Admission: RE | Admit: 2022-01-01 | Discharge: 2022-01-01 | Disposition: A | Discharge status: Home / Self Care | Payer: Medicaid Other | Source: Ambulatory Visit | Attending: Family Medicine | Admitting: Family Medicine

## 2022-01-01 ENCOUNTER — Other Ambulatory Visit: Payer: Self-pay

## 2022-01-01 ENCOUNTER — Other Ambulatory Visit: Payer: Self-pay | Admitting: *Deleted

## 2022-01-01 ENCOUNTER — Encounter: Payer: Self-pay | Admitting: *Deleted

## 2022-01-01 ENCOUNTER — Ambulatory Visit: Payer: Medicaid Other | Attending: Obstetrics

## 2022-01-01 VITALS — BP 104/59 | HR 75

## 2022-01-01 VITALS — BP 108/69 | HR 82 | Wt 151.0 lb

## 2022-01-01 DIAGNOSIS — G7101 Duchenne or Becker muscular dystrophy: Secondary | ICD-10-CM

## 2022-01-01 DIAGNOSIS — Z3A35 35 weeks gestation of pregnancy: Secondary | ICD-10-CM | POA: Diagnosis not present

## 2022-01-01 DIAGNOSIS — J45909 Unspecified asthma, uncomplicated: Secondary | ICD-10-CM | POA: Insufficient documentation

## 2022-01-01 DIAGNOSIS — O99283 Endocrine, nutritional and metabolic diseases complicating pregnancy, third trimester: Secondary | ICD-10-CM | POA: Diagnosis not present

## 2022-01-01 DIAGNOSIS — Z362 Encounter for other antenatal screening follow-up: Secondary | ICD-10-CM

## 2022-01-01 DIAGNOSIS — Z3480 Encounter for supervision of other normal pregnancy, unspecified trimester: Secondary | ICD-10-CM

## 2022-01-01 DIAGNOSIS — O351XX0 Maternal care for (suspected) chromosomal abnormality in fetus, not applicable or unspecified: Secondary | ICD-10-CM | POA: Insufficient documentation

## 2022-01-01 DIAGNOSIS — Z148 Genetic carrier of other disease: Secondary | ICD-10-CM | POA: Diagnosis not present

## 2022-01-01 DIAGNOSIS — O99891 Other specified diseases and conditions complicating pregnancy: Secondary | ICD-10-CM

## 2022-01-01 DIAGNOSIS — O0993 Supervision of high risk pregnancy, unspecified, third trimester: Secondary | ICD-10-CM

## 2022-01-01 DIAGNOSIS — E0590 Thyrotoxicosis, unspecified without thyrotoxic crisis or storm: Secondary | ICD-10-CM | POA: Insufficient documentation

## 2022-01-01 DIAGNOSIS — O3510X0 Maternal care for (suspected) chromosomal abnormality in fetus, unspecified, not applicable or unspecified: Secondary | ICD-10-CM | POA: Insufficient documentation

## 2022-01-01 MED ORDER — VALACYCLOVIR HCL 500 MG PO TABS: 500.0000 mg | ORAL_TABLET | Freq: Two times a day (BID) | ORAL | 0 refills | Status: DC
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
## 2022-01-01 NOTE — Telephone Encounter (Signed)
Patient came to check out for her appointment, when trying to schedule her next appointment patient stated she will call to get it scheduled, I informed her the availability for a one week appointment is hard to wait to do and she stated she will still just call. No appointments were made at her check out.

## 2022-01-02 LAB — GC/CHLAMYDIA PROBE AMP (~~LOC~~) NOT AT ARMC
Chlamydia: NEGATIVE
Comment: NEGATIVE
Comment: NORMAL
Neisseria Gonorrhea: NEGATIVE

## 2022-01-03 ENCOUNTER — Other Ambulatory Visit: Payer: Self-pay

## 2022-01-05 LAB — CULTURE, BETA STREP (GROUP B ONLY): Strep Gp B Culture: NEGATIVE

## 2022-01-08 ENCOUNTER — Telehealth: Payer: Self-pay | Admitting: Family Medicine

## 2022-01-08 ENCOUNTER — Inpatient Hospital Stay (HOSPITAL_COMMUNITY)
Admission: AD | Admit: 2022-01-08 | Discharge: 2022-01-08 | Disposition: A | Discharge status: Home / Self Care | Payer: Medicaid Other | Attending: Obstetrics & Gynecology | Admitting: Obstetrics & Gynecology

## 2022-01-08 ENCOUNTER — Encounter (HOSPITAL_COMMUNITY): Payer: Self-pay | Admitting: Obstetrics & Gynecology

## 2022-01-08 DIAGNOSIS — O4703 False labor before 37 completed weeks of gestation, third trimester: Secondary | ICD-10-CM | POA: Diagnosis not present

## 2022-01-08 DIAGNOSIS — Z3A36 36 weeks gestation of pregnancy: Secondary | ICD-10-CM | POA: Insufficient documentation

## 2022-01-08 DIAGNOSIS — Z3480 Encounter for supervision of other normal pregnancy, unspecified trimester: Secondary | ICD-10-CM

## 2022-01-08 DIAGNOSIS — Z3689 Encounter for other specified antenatal screening: Secondary | ICD-10-CM

## 2022-01-08 DIAGNOSIS — O471 False labor at or after 37 completed weeks of gestation: Secondary | ICD-10-CM | POA: Diagnosis present

## 2022-01-08 DIAGNOSIS — O479 False labor, unspecified: Secondary | ICD-10-CM

## 2022-01-08 NOTE — MAU Note (Signed)
.  Angela Branch is a 26 y.o. at [redacted]w[redacted]d here in MAU reporting: ctx for the last 12 hours, they are every 5 minutes apart now. Denies VB or LOF. Endorses good fetal movement.   Pain score: 7

## 2022-01-08 NOTE — Telephone Encounter (Signed)
Patient called to cancel her appointment for 1/19, after asking the patient if she wanted to go ahead and reschedule she said she did not want to.

## 2022-01-08 NOTE — MAU Note (Signed)
.  Angela Branch is a 26 y.o. at [redacted]w[redacted]d here in MAU reporting UCs that started 01/07/22 in the evening.  This morning UCS noted to be 5 minutes apart and have lasted for greater than 1 hour.  + FM noted.  Baby noted to have muscular Dystrophy with NST's occurring weekly.  No leaking of blood or fluid.  History of HSV with no outbreak for 2 years. Patient has not been taking Valtrex.   Vitals:   01/08/22 1000  BP: 112/64  Pulse: 84  Resp: 18  Temp: 98.4 F (36.9 C)     FHT: 140

## 2022-01-17 ENCOUNTER — Inpatient Hospital Stay (HOSPITAL_COMMUNITY): Payer: Medicaid Other | Admitting: Anesthesiology

## 2022-01-17 ENCOUNTER — Other Ambulatory Visit: Payer: Self-pay

## 2022-01-17 ENCOUNTER — Encounter (HOSPITAL_COMMUNITY): Payer: Self-pay | Admitting: Obstetrics & Gynecology

## 2022-01-17 ENCOUNTER — Inpatient Hospital Stay (HOSPITAL_COMMUNITY)
Admission: AD | Admit: 2022-01-17 | Discharge: 2022-01-18 | DRG: 806 | Disposition: A | Discharge status: Home / Self Care | Payer: Medicaid Other | Attending: Obstetrics and Gynecology | Admitting: Obstetrics and Gynecology

## 2022-01-17 DIAGNOSIS — Z20822 Contact with and (suspected) exposure to covid-19: Secondary | ICD-10-CM | POA: Diagnosis present

## 2022-01-17 DIAGNOSIS — O9902 Anemia complicating childbirth: Secondary | ICD-10-CM | POA: Diagnosis not present

## 2022-01-17 DIAGNOSIS — E0590 Thyrotoxicosis, unspecified without thyrotoxic crisis or storm: Secondary | ICD-10-CM | POA: Diagnosis present

## 2022-01-17 DIAGNOSIS — O99354 Diseases of the nervous system complicating childbirth: Secondary | ICD-10-CM | POA: Diagnosis present

## 2022-01-17 DIAGNOSIS — O9832 Other infections with a predominantly sexual mode of transmission complicating childbirth: Secondary | ICD-10-CM | POA: Diagnosis not present

## 2022-01-17 DIAGNOSIS — G7101 Duchenne or Becker muscular dystrophy: Secondary | ICD-10-CM | POA: Diagnosis present

## 2022-01-17 DIAGNOSIS — D649 Anemia, unspecified: Secondary | ICD-10-CM | POA: Diagnosis not present

## 2022-01-17 DIAGNOSIS — A6000 Herpesviral infection of urogenital system, unspecified: Secondary | ICD-10-CM | POA: Diagnosis present

## 2022-01-17 DIAGNOSIS — O99284 Endocrine, nutritional and metabolic diseases complicating childbirth: Secondary | ICD-10-CM | POA: Diagnosis not present

## 2022-01-17 DIAGNOSIS — Z3A38 38 weeks gestation of pregnancy: Secondary | ICD-10-CM | POA: Diagnosis not present

## 2022-01-17 DIAGNOSIS — Z148 Genetic carrier of other disease: Secondary | ICD-10-CM

## 2022-01-17 DIAGNOSIS — Z3480 Encounter for supervision of other normal pregnancy, unspecified trimester: Secondary | ICD-10-CM

## 2022-01-17 DIAGNOSIS — B009 Herpesviral infection, unspecified: Secondary | ICD-10-CM | POA: Diagnosis present

## 2022-01-17 DIAGNOSIS — O26893 Other specified pregnancy related conditions, third trimester: Secondary | ICD-10-CM | POA: Diagnosis not present

## 2022-01-17 LAB — CBC
HCT: 37.6 % (ref 36.0–46.0)
Hemoglobin: 12.6 g/dL (ref 12.0–15.0)
MCH: 29.4 pg (ref 26.0–34.0)
MCHC: 33.5 g/dL (ref 30.0–36.0)
MCV: 87.9 fL (ref 80.0–100.0)
Platelets: 176 10*3/uL (ref 150–400)
RBC: 4.28 MIL/uL (ref 3.87–5.11)
RDW: 13.9 % (ref 11.5–15.5)
WBC: 10.6 10*3/uL — ABNORMAL HIGH (ref 4.0–10.5)
nRBC: 0 % (ref 0.0–0.2)

## 2022-01-17 LAB — TYPE AND SCREEN
ABO/RH(D): A POS
Antibody Screen: NEGATIVE

## 2022-01-17 LAB — POCT FERN TEST

## 2022-01-17 LAB — RPR: RPR Ser Ql: NONREACTIVE

## 2022-01-17 LAB — RESP PANEL BY RT-PCR (FLU A&B, COVID) ARPGX2
Influenza A by PCR: NEGATIVE
Influenza B by PCR: NEGATIVE
SARS Coronavirus 2 by RT PCR: NEGATIVE

## 2022-01-17 MED ORDER — IBUPROFEN 600 MG PO TABS
600.0000 mg | ORAL_TABLET | Freq: Four times a day (QID) | ORAL | Status: DC
  Administered 2022-01-18 (×4): 600 mg via ORAL
  Filled 2022-01-17 (×4): qty 1

## 2022-01-17 MED ORDER — MAGNESIUM HYDROXIDE 400 MG/5ML PO SUSP: 30.0000 mL | ORAL | Status: DC | PRN

## 2022-01-17 MED ORDER — LACTATED RINGERS IV SOLN: 500.0000 mL | Freq: Once | INTRAVENOUS | Status: DC

## 2022-01-17 MED ORDER — OXYTOCIN-SODIUM CHLORIDE 30-0.9 UT/500ML-% IV SOLN: 2.5000 [IU]/h | INTRAVENOUS | Status: DC

## 2022-01-17 MED ORDER — ONDANSETRON HCL 4 MG/2ML IJ SOLN
4.0000 mg | Freq: Four times a day (QID) | INTRAMUSCULAR | Status: DC | PRN
  Administered 2022-01-17: 4 mg via INTRAVENOUS
  Filled 2022-01-17: qty 2

## 2022-01-17 MED ORDER — OXYTOCIN BOLUS FROM INFUSION
333.0000 mL | Freq: Once | INTRAVENOUS | Status: AC
  Administered 2022-01-17: 333 mL via INTRAVENOUS

## 2022-01-17 MED ORDER — COCONUT OIL OIL: 1.0000 "application " | TOPICAL_OIL | Status: DC | PRN

## 2022-01-17 MED ORDER — ACETAMINOPHEN 325 MG PO TABS: 650.0000 mg | ORAL_TABLET | ORAL | Status: DC | PRN

## 2022-01-17 MED ORDER — BENZOCAINE-MENTHOL 20-0.5 % EX AERO: 1.0000 "application " | INHALATION_SPRAY | CUTANEOUS | Status: DC | PRN

## 2022-01-17 MED ORDER — ONDANSETRON HCL 4 MG PO TABS: 4.0000 mg | ORAL_TABLET | ORAL | Status: DC | PRN

## 2022-01-17 MED ORDER — TETANUS-DIPHTH-ACELL PERTUSSIS 5-2.5-18.5 LF-MCG/0.5 IM SUSY: 0.5000 mL | PREFILLED_SYRINGE | Freq: Once | INTRAMUSCULAR | Status: DC

## 2022-01-17 MED ORDER — MEASLES, MUMPS & RUBELLA VAC IJ SOLR: 0.5000 mL | Freq: Once | INTRAMUSCULAR | Status: DC

## 2022-01-17 MED ORDER — PHENYLEPHRINE 40 MCG/ML (10ML) SYRINGE FOR IV PUSH (FOR BLOOD PRESSURE SUPPORT): 80.0000 ug | PREFILLED_SYRINGE | INTRAVENOUS | Status: DC | PRN

## 2022-01-17 MED ORDER — IBUPROFEN 600 MG PO TABS: 600.0000 mg | ORAL_TABLET | Freq: Once | ORAL | Status: DC

## 2022-01-17 MED ORDER — FENTANYL-BUPIVACAINE-NACL 0.5-0.125-0.9 MG/250ML-% EP SOLN
12.0000 mL/h | EPIDURAL | Status: DC | PRN
  Administered 2022-01-17: 12 mL/h via EPIDURAL
  Filled 2022-01-17: qty 250

## 2022-01-17 MED ORDER — SIMETHICONE 80 MG PO CHEW
80.0000 mg | CHEWABLE_TABLET | ORAL | Status: DC | PRN
  Filled 2022-01-17: qty 1

## 2022-01-17 MED ORDER — EPHEDRINE 5 MG/ML INJ: 10.0000 mg | INTRAVENOUS | Status: DC | PRN

## 2022-01-17 MED ORDER — WITCH HAZEL-GLYCERIN EX PADS: 1.0000 "application " | MEDICATED_PAD | CUTANEOUS | Status: DC | PRN

## 2022-01-17 MED ORDER — OXYCODONE-ACETAMINOPHEN 5-325 MG PO TABS: 2.0000 | ORAL_TABLET | ORAL | Status: DC | PRN

## 2022-01-17 MED ORDER — LIDOCAINE HCL (PF) 1 % IJ SOLN: 30.0000 mL | INTRAMUSCULAR | Status: DC | PRN

## 2022-01-17 MED ORDER — SOD CITRATE-CITRIC ACID 500-334 MG/5ML PO SOLN: 30.0000 mL | ORAL | Status: DC | PRN

## 2022-01-17 MED ORDER — LACTATED RINGERS IV SOLN: 500.0000 mL | INTRAVENOUS | Status: DC | PRN

## 2022-01-17 MED ORDER — FENTANYL CITRATE (PF) 100 MCG/2ML IJ SOLN
50.0000 ug | INTRAMUSCULAR | Status: DC | PRN
  Administered 2022-01-17: 50 ug via INTRAVENOUS
  Administered 2022-01-17: 100 ug via INTRAVENOUS
  Filled 2022-01-17 (×3): qty 2

## 2022-01-17 MED ORDER — OXYTOCIN-SODIUM CHLORIDE 30-0.9 UT/500ML-% IV SOLN
1.0000 m[IU]/min | INTRAVENOUS | Status: DC
  Administered 2022-01-17: 2 m[IU]/min via INTRAVENOUS
  Filled 2022-01-17: qty 500

## 2022-01-17 MED ORDER — OXYCODONE-ACETAMINOPHEN 5-325 MG PO TABS: 1.0000 | ORAL_TABLET | ORAL | Status: DC | PRN

## 2022-01-17 MED ORDER — LIDOCAINE HCL (PF) 1 % IJ SOLN
INTRAMUSCULAR | Status: DC | PRN
  Administered 2022-01-17: 11 mL via EPIDURAL

## 2022-01-17 MED ORDER — DIBUCAINE (PERIANAL) 1 % EX OINT: 1.0000 "application " | TOPICAL_OINTMENT | CUTANEOUS | Status: DC | PRN

## 2022-01-17 MED ORDER — LACTATED RINGERS IV SOLN: INTRAVENOUS | Status: DC

## 2022-01-17 MED ORDER — ACETAMINOPHEN 325 MG PO TABS
650.0000 mg | ORAL_TABLET | ORAL | Status: DC | PRN
  Administered 2022-01-17: 650 mg via ORAL
  Filled 2022-01-17: qty 2

## 2022-01-17 MED ORDER — ONDANSETRON HCL 4 MG/2ML IJ SOLN: 4.0000 mg | INTRAMUSCULAR | Status: DC | PRN

## 2022-01-17 MED ORDER — FENTANYL CITRATE (PF) 100 MCG/2ML IJ SOLN
100.0000 ug | INTRAMUSCULAR | Status: DC | PRN
  Administered 2022-01-17 (×2): 100 ug via INTRAVENOUS
  Filled 2022-01-17: qty 2

## 2022-01-17 MED ORDER — PRENATAL MULTIVITAMIN CH
1.0000 | ORAL_TABLET | Freq: Every day | ORAL | Status: DC
  Administered 2022-01-18: 1 via ORAL
  Filled 2022-01-17: qty 1

## 2022-01-17 MED ORDER — TERBUTALINE SULFATE 1 MG/ML IJ SOLN: 0.2500 mg | Freq: Once | INTRAMUSCULAR | Status: DC | PRN

## 2022-01-17 MED ORDER — DIPHENHYDRAMINE HCL 25 MG PO CAPS: 25.0000 mg | ORAL_CAPSULE | Freq: Four times a day (QID) | ORAL | Status: DC | PRN

## 2022-01-17 MED ORDER — DIPHENHYDRAMINE HCL 50 MG/ML IJ SOLN: 12.5000 mg | INTRAMUSCULAR | Status: DC | PRN

## 2022-01-17 MED ORDER — VALACYCLOVIR HCL 500 MG PO TABS
500.0000 mg | ORAL_TABLET | Freq: Two times a day (BID) | ORAL | Status: DC
  Administered 2022-01-17: 500 mg via ORAL
  Filled 2022-01-17: qty 1

## 2022-01-17 NOTE — Progress Notes (Signed)
Angela Branch is a 26 y.o. [id number] at [redacted]w[redacted]d   Subjective: Endorses significant discomfort with contractions. Desires epidural before AROM  Objective: BP 105/63    Pulse 70    Temp 97.9 F (36.6 C) (Oral)    Resp 16    Ht $R'5\' 3"'uh$  (1.6 m)    Wt 70.9 kg    SpO2 97%    BMI 27.67 kg/m  No intake/output data recorded. No intake/output data recorded.  FHT:  FHR: 125 bpm, variability: moderate,  accelerations:  Present,  decelerations:  Absent UC:   regular, every 2-3 minutes SVE:   Dilation: 7 Effacement (%): 80 Station: -2 Exam by:: k fields, rn  Labs: Lab Results  Component Value Date   WBC 10.6 (H) 01/17/2022   HGB 12.6 01/17/2022   HCT 37.6 01/17/2022   MCV 87.9 01/17/2022   PLT 176 01/17/2022    Assessment / Plan: --Now 7cm, Pitocin at 6 milliunits --Desires epidural --Will AROM once comfortable  Darlina Rumpf, CNM 01/17/2022, 2:23 PM

## 2022-01-17 NOTE — Discharge Summary (Signed)
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 yo [id number] admitted in Latent Labor on 01/17/2022. Patient had an uncomplicated labor course as follows:  Membrane Rupture Time/Date: 3:39 PM ,01/17/2022   Delivery Method:Vaginal, Spontaneous  Episiotomy: None  Lacerations:  None  Patient had an uncomplicated postpartum course.  She is ambulating, tolerating a regular diet, passing flatus, and urinating well. Patient is discharged home in stable condition on 01/18/22.  Newborn Data: Birth date:01/17/2022  Birth time:3:54 PM  Gender:Female  Living status:Living  Apgars:9 ,9  [id number]   Magnesium Sulfate received: No BMZ received: No Rhophylac: N/A MMR: N/A T-DaP: Given 02/2021, declined in current pregnancy Flu: No Transfusion: No  Physical exam  Vitals:   01/17/22 1827 01/17/22 2240 01/18/22 0223 01/18/22 0621  BP: 109/64 106/61 (!) 100/56 (!) 93/56  Pulse: (!) 59 69 (!) 59 (!) 58  Resp: _0 Temp: 98.1 F (36.7 C) 97.9 F (36.6 C) 98 F (36.7 C) 98.1 F (36.7 C)  TempSrc: Oral Oral Oral Oral  SpO2:  98% 99% 100%  Weight:      Height:       General: alert, cooperative, and no  distress Lochia: appropriate Uterine Fundus: firm and below umbilicus  DVT Evaluation: no LE edema or calf tenderness to palpation  Labs: Lab Results  Component Value Date   WBC 10.6 (H) 01/17/2022   HGB 12.6 01/17/2022   HCT 37.6 01/17/2022   MCV 87.9 01/17/2022   PLT 176 01/17/2022   CMP Latest Ref Rng & Units 09/06/2021  Glucose 70 - 99 mg/dL 70  BUN 6 - 20 mg/dL 6  Creatinine 0.44 - 1.00 mg/dL 0.67  Sodium 135 - 145 mmol/L 135  Potassium 3.5 - 5.1 mmol/L 3.1(L)  Chloride 98 - 111 mmol/L 106  CO2 22 - 32 mmol/L 24  Calcium 8.9 - 10.3 mg/dL 8.9  Total Protein 6.5 - 8.1 g/dL 5.6(L)  Total Bilirubin 0.3 - 1.2 mg/dL 1.3(H)  Alkaline Phos 38 - 126 U/L 28(L)  AST 15 - 41 U/L 18  ALT 0 - 44 U/L 11   Edinburgh Score: Edinburgh Postnatal Depression Scale Screening Tool 01/17/2022  I have been able to laugh and see the funny side of things. 0  I have looked forward with enjoyment to things. 0  I have blamed myself unnecessarily when things went wrong. 0  I have been anxious or worried for no good reason. 0  I have felt scared or panicky for no good reason.  0  Things have been getting on top of me. 0  I have been so unhappy that I have had difficulty sleeping. 0  I have felt sad or miserable. 0  I have been so unhappy that I have been crying. 0  The thought of harming myself has occurred to me. 0  Edinburgh Postnatal Depression Scale Total 0     After visit meds:  Allergies as of 01/18/2022       Reactions   Azithromycin Diarrhea   Diarrhea, nausea/vomiting, IV burns vein Diarrhea, nausea/vomiting, IV burns vein        Medication List     STOP taking these medications    valACYclovir 500 MG tablet Commonly known as: Valtrex       TAKE these medications    acetaminophen 500 MG tablet Commonly known as: TYLENOL Take 2 tablets (1,000 mg total) by mouth every 8 (eight) hours as needed (pain).   ibuprofen 600 MG tablet Commonly known as: ADVIL Take 1 tablet  (600 mg total) by mouth every 6 (six) hours as needed (pain).   norethindrone 0.35 MG tablet Commonly known as: MICRONOR Take 1 tablet (0.35 mg total) by mouth daily. For birth control.   oxyCODONE 5 MG immediate release tablet Commonly known as: Oxy IR/ROXICODONE Take 1 tablet (5 mg total) by mouth every 6 (six) hours as needed for breakthrough pain or severe pain.         Discharge home in stable condition Infant Feeding: Breast Infant Disposition: home with mother Discharge instruction: per After Visit Summary and Postpartum booklet. Activity: Advance as tolerated. Pelvic rest for 6 weeks.  Diet: routine diet  Follow up Visit: Message sent by Maryelizabeth Kaufmann, CNM on 01/17/2022  Please schedule this patient for a In person postpartum visit in  4-6 weeks  with the following provider: Any provider. 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

## 2022-01-17 NOTE — Progress Notes (Signed)
Labor Progress Note Angela Branch is a 26 y.o. [id number] at [redacted]w[redacted]d who presented for SOL.   S: Doing well. Continues to feel her contractions. No concerns at this time.  O:  BP 112/67 (BP Location: Left Arm)    Pulse 68    Temp 97.8 F (36.6 C) (Oral)    Resp 18    Ht $R'5\' 3"'pv$  (1.6 m)    Wt 70.9 kg    SpO2 97%    BMI 27.67 kg/m   EFM: Baseline 125 bpm, moderate variability, + accels, no decels   CVE: Dilation: 4 Effacement (%): 80 Cervical Position: Middle Station: -3 Presentation: Vertex Exam by:: Dr. Gwenlyn Perking   A&P: 26 y.o. [id number] [redacted]w[redacted]d   #Labor: Progressing well. Bulging bag noted on exam. Offered AROM, patient would like to wait until next check. Will reassess in 3-4 hours.  #Pain: PRN #FWB: Cat 1  #GBS negative  Genia Del, MD 6:25 AM

## 2022-01-17 NOTE — Progress Notes (Signed)
ANNALEAH ARATA is a 26 y.o. [phone] at [redacted]w[redacted]d   Subjective: Crying and vocalizing with contractions, requesting IV pain medication after cervical exam  Objective: BP 107/63    Pulse 62    Temp 98.4 F (36.9 C) (Oral)    Resp 16    Ht $R'5\' 3"'Mm$  (1.6 m)    Wt 70.9 kg    SpO2 97%    BMI 27.67 kg/m  No intake/output data recorded. No intake/output data recorded.  FHT:  FHR: 125 bpm, variability: moderate,  accelerations:  Present,  decelerations:  Absent UC:   regular, every 3-5 minutes SVE:   Dilation: 5 Effacement (%): 80 Station: -3 Exam by:: sam Leiana Rund, cnm  Labs: Lab Results  Component Value Date   WBC 10.6 (H) 01/17/2022   HGB 12.6 01/17/2022   HCT 37.6 01/17/2022   MCV 87.9 01/17/2022   PLT 176 01/17/2022    Assessment / Plan: --26 y.o. [id number] at [redacted]w[redacted]d  --Cat I tracing --GBS neg --Previously 3.5 cm, now 5cm/80% with BBOW per my exam --Poor fetal engagement on maternal left, prefer to initiate Pitocin. Pt amenable --Desires IV Fentanyl. Preemptive discussion regarding patient perception of effectiveness with successive doses. Will order q 1 hour x 4 then consider epidural PRN --Update given to Dr. Ilda Basset --Anticipate vaginal delivery   Darlina Rumpf, [place] 01/17/2022, 12:28 PM

## 2022-01-17 NOTE — Progress Notes (Signed)
Angela Branch is a 26 y.o. [id number] at [redacted]w[redacted]d   Subjective: Patient aware of contractions. Pain score 6/10.  Objective: BP 107/63    Pulse 62    Temp 98.4 F (36.9 C) (Oral)    Resp 16    Ht $R'5\' 3"'dp$  (1.6 m)    Wt 70.9 kg    SpO2 97%    BMI 27.67 kg/m  No intake/output data recorded. No intake/output data recorded.  FHT:  FHR: 120 bpm, variability: moderate,  accelerations:  Present,  decelerations: Absent UC:   irregular, every 5-7 minutes SVE:   Dilation: 3.5 Effacement (%): 50 Station: Ballotable Exam by:: sam Annabelle Rexroad, cnm  Labs: Lab Results  Component Value Date   WBC 10.6 (H) 01/17/2022   HGB 12.6 01/17/2022   HCT 37.6 01/17/2022   MCV 87.9 01/17/2022   PLT 176 01/17/2022    Assessment / Plan: --26 y.o. [id number] at [redacted]w[redacted]d  --Cat I tracing --GBS negative --3.5-4/thick/ballotable per my exam --Bulging bag and plan for AROM per previous shift Provider --Fetus not engaged, no BBOW per my exam --Patient not a candidate for AROM at this time --Discussed with patient AROM not safe at this time, Pitocin would constitute IOL and we typically do not induce at current GA due to concern for increased c/s rate --Discussed with Dr. Ilda Basset. Patient to be rechecked at noon, will discharge home at that time if no cervical change --Patient understandably frustrated but agreeable with plan. Will perform membrane sweep, patient to assume position changes out of bed with birthing ball, may walk in hall as desired --Will reassess at 12:30pm  Darlina Rumpf, CNM 01/17/2022, 9:50 AM

## 2022-01-17 NOTE — Discharge Instructions (Signed)
Vaginal Delivery Care After Refer to this sheet in the next few weeks. These discharge instructions provide you with information on caring for yourself after delivery. Your caregiver may also give you specific instructions. Your treatment has been planned according to the most current medical practices available, but problems sometimes occur. Call your caregiver if you have any problems or questions after you go home. HOME CARE INSTRUCTIONS Take over-the-counter or prescription medicines only as directed by your caregiver or pharmacist. Do not drink alcohol, especially if you are breastfeeding or taking medicine to relieve pain. Do not chew or smoke tobacco. Do not use illegal drugs. Continue to use good perineal care. Good perineal care includes: Wiping your perineum from front to back. Keeping your perineum clean. Do not use tampons or douche until your caregiver says it is okay. Shower, wash your hair, and take tub baths as directed by your caregiver. Wear a well-fitting bra that provides breast support. Eat healthy foods. Drink enough fluids to keep your urine clear or pale yellow. Eat high-fiber foods such as whole grain cereals and breads, brown rice, beans, and fresh fruits and vegetables every day. These foods may help prevent or relieve constipation. Follow your cargiver's recommendations regarding resumption of activities such as climbing stairs, driving, lifting, exercising, or traveling. Talk to your caregiver about resuming sexual activities. Resumption of sexual activities is dependent upon your risk of infection, your rate of healing, and your comfort and desire to resume sexual activity. Try to have someone help you with your household activities and your newborn for at least a few days after you leave the hospital. Rest as much as possible. Try to rest or take a nap when your newborn is sleeping. Increase your activities gradually. Keep all of your scheduled postpartum  appointments. It is very important to keep your scheduled follow-up appointments. At these appointments, your caregiver will be checking to make sure that you are healing physically and emotionally. SEEK MEDICAL CARE IF:  You are passing large clots from your vagina. Save any clots to show your caregiver. You have a foul smelling discharge from your vagina. You have trouble urinating. You are urinating frequently. You have pain when you urinate. You have a change in your bowel movements. You have increasing redness, pain, or swelling near your vaginal incision (episiotomy) or vaginal tear. You have pus draining from your episiotomy or vaginal tear. Your episiotomy or vaginal tear is separating. You have painful, hard, or reddened breasts. You have a severe headache. You have blurred vision or see spots. You feel sad or depressed. You have thoughts of hurting yourself or your newborn. You have questions about your care, the care of your newborn, or medicines. You are dizzy or lightheaded. You have a rash. You have nausea or vomiting. You were breastfeeding and have not had a menstrual period within 12 weeks after you stopped breastfeeding. You are not breastfeeding and have not had a menstrual period by the 12th week after delivery. You have a fever. SEEK IMMEDIATE MEDICAL CARE IF:  You have persistent pain. You have chest pain. You have shortness of breath. You faint. You have leg pain. You have stomach pain. Your vaginal bleeding saturates two or more sanitary pads in 1 hour. MAKE SURE YOU:  Understand these instructions. Will watch your condition. Will get help right away if you are not doing well or get worse. Document Released: 12/06/2000 Document Revised: 09/02/2012 Document Reviewed: 08/05/2012 [facility] Patient Information 2014 [employer].
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
## 2022-01-17 NOTE — MAU Note (Signed)
Pt reports to MAU c/o ctx that have been going on all day since about 0840. They are 4-5 minutes apart and last a minute long. Rates pain 6/10. Denies any VB. States unsure if LOF and reports some watery discharge. +FM.   Last SVE was here in MAU last week and was 1cm

## 2022-01-17 NOTE — Anesthesia Preprocedure Evaluation (Signed)
Anesthesia Evaluation  Patient identified by MRN, date of birth, ID band Patient awake    Reviewed: Allergy & Precautions, H&P , NPO status , Patient's Chart, lab work & pertinent test results  Airway Mallampati: II  TM Distance: >3 FB Neck ROM: full    Dental no notable dental hx.    Pulmonary asthma , sleep apnea ,    Pulmonary exam normal breath sounds clear to auscultation       Cardiovascular negative cardio ROS   Rhythm:regular Rate:Normal     Neuro/Psych  Headaches, PSYCHIATRIC DISORDERS Anxiety Adjustment disorder, anxiety, depression   GI/Hepatic GERD  Medicated and Controlled,(+)     substance abuse  marijuana use,   Endo/Other  negative endocrine ROS  Renal/GU negative Renal ROS     Musculoskeletal negative musculoskeletal ROS (+)   Abdominal   Peds  Hematology  (+) anemia , plt 169   Anesthesia Other Findings   Reproductive/Obstetrics (+) Pregnancy                             Anesthesia Physical  Anesthesia Plan  ASA: II  Anesthesia Plan: Epidural   Post-op Pain Management:    Induction:   PONV Risk Score and Plan: 2  Airway Management Planned: Natural Airway  Additional Equipment: None  Intra-op Plan:   Post-operative Plan:   Informed Consent: I have reviewed the patients History and Physical, chart, labs and discussed the procedure including the risks, benefits and alternatives for the proposed anesthesia with the patient or authorized representative who has indicated his/her understanding and acceptance.       Plan Discussed with:   Anesthesia Plan Comments:         Anesthesia Quick Evaluation

## 2022-01-17 NOTE — Anesthesia Procedure Notes (Signed)
Epidural Patient location during procedure: OB Start time: 01/17/2022 2:34 PM End time: 01/17/2022 2:49 PM  Staffing Anesthesiologist: Lynda Rainwater, MD Performed: anesthesiologist   Preanesthetic Checklist Completed: patient identified, IV checked, site marked, risks and benefits discussed, surgical consent, monitors and equipment checked, pre-op evaluation and timeout performed  Epidural Patient position: sitting Prep: ChloraPrep Patient monitoring: heart rate, cardiac monitor, continuous pulse ox and blood pressure Approach: midline Location: L2-L3 Injection technique: LOR saline  Needle:  Needle type: Tuohy  Needle gauge: 17 G Needle length: 9 cm Needle insertion depth: 4 cm Catheter type: closed end flexible Catheter size: 20 Guage Catheter at skin depth: 8 cm Test dose: negative  Assessment Events: blood not aspirated, injection not painful, no injection resistance, no paresthesia and negative IV test  Additional Notes Reason for block:procedure for pain

## 2022-01-18 MED ORDER — ACETAMINOPHEN 500 MG PO TABS: 1000.0000 mg | ORAL_TABLET | Freq: Three times a day (TID) | ORAL | 0 refills | Status: DC | PRN

## 2022-01-18 MED ORDER — NORETHINDRONE 0.35 MG PO TABS: 1.0000 | ORAL_TABLET | Freq: Every day | ORAL | 11 refills | Status: DC

## 2022-01-18 MED ORDER — IBUPROFEN 600 MG PO TABS: 600.0000 mg | ORAL_TABLET | Freq: Four times a day (QID) | ORAL | 0 refills | Status: DC | PRN

## 2022-01-18 MED ORDER — OXYCODONE HCL 5 MG PO TABS
5.0000 mg | ORAL_TABLET | ORAL | Status: DC | PRN
  Administered 2022-01-18 (×3): 5 mg via ORAL
  Filled 2022-01-18 (×3): qty 1

## 2022-01-18 MED ORDER — OXYCODONE HCL 5 MG PO TABS: 5.0000 mg | ORAL_TABLET | Freq: Four times a day (QID) | ORAL | 0 refills | Status: DC | PRN

## 2022-01-18 MED ORDER — NORETHINDRONE 0.35 MG PO TABS
1.0000 | ORAL_TABLET | Freq: Every day | ORAL | 11 refills | Status: DC
Start: 2022-01-18 — End: 2022-01-18

## 2022-01-18 NOTE — Anesthesia Postprocedure Evaluation (Signed)
Anesthesia Post Note  Patient: Angela Branch  Procedure(s) Performed: AN AD HOC LABOR EPIDURAL     Patient location during evaluation: Mother Baby Anesthesia Type: Epidural Level of consciousness: awake and alert and oriented Pain management: satisfactory to patient Vital Signs Assessment: post-procedure vital signs reviewed and stable Respiratory status: respiratory function stable Cardiovascular status: stable Postop Assessment: no headache, no backache, epidural receding, patient able to bend at knees, no signs of nausea or vomiting, adequate PO intake and able to ambulate Anesthetic complications: no   No notable events documented.  Last Vitals:  Vitals:   01/18/22 0223 01/18/22 0621  BP: (!) 100/56 (!) 93/56  Pulse: (!) 59 (!) 58  Resp: 16 15  Temp: 36.7 C 36.7 C  SpO2: 99% 100%    Last Pain:  Vitals:   01/18/22 0935  TempSrc:   PainSc: 0-No pain   Pain Goal:                   Carnetta Losada

## 2022-01-18 NOTE — Social Work (Signed)
CSW received consult for hx of Anxiety/ depression,infant dx Muscular Dystrophy (amnio). CSW met with MOB to offer support and complete assessment.    CSW met with MOB at bedside and introduced CSW role. CSW observed MOB up in the room and FOB present at bedside. MOB was receptive to [place] visit with FOB present. MOB identified FOB as her support. CSW inquired how MOB has felt since giving birth. MOB reported feeling okay and that the L&D went fine. CSW inquired how MOB felt emotionally during the pregnancy. MOB reported emotionally she felt fine. CSW inquired about MOB history of anxiety and depression. MOB reported she was diagnosed with depression and anxiety at age 89 and has not had any concerns with depression or anxiety. MOB reported no medication or therapy treatment (it is noted that MOB sees [facility] counselor). CSW inquired about MOB coping strategies. 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 MOB shared she is looking forward to going home to be with her family. MOB reported also copes by taking time to sit outside and uses showering as self-care. CSW encouraged MOB to continue implementing her coping strategies. MOB was knowledgeable of PPD and stated she has not experienced PPD. MOB was receptive to the resources provided. CSW provided education regarding the baby blues period vs. perinatal mood disorders, discussed treatment and gave resources for mental health follow up if concerns arise. CSW recommended MOB complete a self-evaluation during the postpartum time period using the New Mom Checklist from Postpartum Progress and encouraged MOB to contact a medical professional if symptoms are noted at any time. MOB reported she feels comfortable reaching out to her doctor if she has concerns. MOB denied SI/HI.   MOB reported she has essential items for the infant including a bassinet where the infant will sleep. CSW provided review of Sudden Infant  Death Syndrome (SIDS) precautions. MOB was very knowledgeable of SIDS and reported she uses the [facility] monitor. MOB has chosen [facility] for the infant's follow up care. CSW offered additional community resources. MOB declined community resources as this time. CSW assessed MOB for additional needs. MOB reported no further need.     CSW identifies no further need for intervention and no barriers to discharge at this time.   Kathrin Greathouse, MSW, LCSW Women's and [place] Worker  [phone] 01/18/2022  2:05 PM

## 2022-01-21 ENCOUNTER — Telehealth: Payer: Self-pay

## 2022-01-21 NOTE — Telephone Encounter (Signed)
Transition Care Management Unsuccessful Follow-up Telephone Call  Date of discharge and from where:  01/18/2022 from [facility]  Attempts:  1st Attempt  Reason for unsuccessful TCM follow-up call:  Unable to leave message

## 2022-01-22 NOTE — Telephone Encounter (Signed)
Transition Care Management Unsuccessful Follow-up Telephone Call  Date of discharge and from where:  01/18/2022 from [facility]  Attempts:  2nd Attempt  Reason for unsuccessful TCM follow-up call:  Left voice message

## 2022-01-23 NOTE — Telephone Encounter (Signed)
Transition Care Management Follow-up Telephone Call Date of discharge and from where: 01/18/2022 from [facility] How have you been since you were released from the hospital? Pt stated that she is feeling okay. She is upset with the way the discharge was completed. Pt stated that staff would take the time to give the father a chance to sign the birth certificate.  Any questions or concerns? No  Items Reviewed: Did the pt receive and understand the discharge instructions provided? Yes  Medications obtained and verified? Yes  Other? No  Any new allergies since your discharge? No  Dietary orders reviewed? No Do you have support at home? Yes   Functional Questionnaire: (I = Independent and D = Dependent) ADLs: I  Bathing/Dressing- I  Meal Prep- I  Eating- I  Maintaining continence- I  Transferring/Ambulation- I  Managing Meds- I   Follow up appointments reviewed:  PCP Hospital f/u appt confirmed? No   Specialist Hospital f/u appt confirmed? No   Are transportation arrangements needed? No  If their condition worsens, is the pt aware to call PCP or go to the Emergency Dept.? Yes Was the patient provided with contact information for the PCP's office or ED? Yes Was to pt encouraged to call back with questions or concerns? Yes

## 2022-01-29 ENCOUNTER — Telehealth (HOSPITAL_COMMUNITY): Payer: Self-pay | Admitting: *Deleted

## 2022-01-29 NOTE — Telephone Encounter (Signed)
Patient voiced no questions or concerns at this time. Patient declined opportunity to complete the EPDS. Stated, "I already did that in the hospital and I'm fine."  Patient voiced no questions or concerns regarding infant at this time. Patient reports infant sleeps in a bassinet on his back. RN reviewed ABCs of safe sleep. Patient verbalized understanding. Patient informed about hospital's virtual postpartum classes and support groups - declined email information at this time. Erline Levine, RN, 01/29/22, [phone]

## 2022-02-07 ENCOUNTER — Telehealth: Payer: Self-pay | Admitting: Pediatrics

## 2022-02-07 MED ORDER — CEPHALEXIN 500 MG PO CAPS: 500.0000 mg | ORAL_CAPSULE | Freq: Two times a day (BID) | ORAL | 0 refills | Status: AC

## 2022-02-07 NOTE — Telephone Encounter (Signed)
Mom called for antibiotics for mastitis --called in keflex BID

## 2022-02-26 ENCOUNTER — Other Ambulatory Visit: Payer: Self-pay

## 2022-02-26 ENCOUNTER — Encounter: Payer: Self-pay | Admitting: Advanced Practice Midwife

## 2022-02-26 ENCOUNTER — Ambulatory Visit (INDEPENDENT_AMBULATORY_CARE_PROVIDER_SITE_OTHER): Payer: Medicaid Other | Admitting: Advanced Practice Midwife

## 2022-02-26 DIAGNOSIS — Z3009 Encounter for other general counseling and advice on contraception: Secondary | ICD-10-CM

## 2022-02-26 LAB — POCT PREGNANCY, URINE: Preg Test, Ur: NEGATIVE

## 2022-02-26 NOTE — Progress Notes (Signed)
? ? ?  Post Partum Visit Note ? ?Angela Branch is a 26 y.o. G3T5176 female who presents for a postpartum visit. She is  5.5  weeks postpartum following a normal spontaneous vaginal delivery.  I have fully reviewed the prenatal and intrapartum course. The delivery was at 38.1 gestational weeks.  Anesthesia: none. Postpartum course has been complicated by mastitis. Patient received Keflex from her Pediatrician and denies complaints or concerns today. Baby is doing well. Baby is feeding by breast. Bleeding no bleeding. Bowel function is normal. Bladder function is normal. Patient is sexually active. Contraception method is  Associate Professor . Postpartum depression screening: negative. ? ? ?The pregnancy intention screening data noted above was reviewed. Potential methods of contraception were discussed. The patient elected to proceed with No data recorded. ? ? ? ?Health Maintenance Due  ?Topic Date Due  ? COVID-19 Vaccine (1) Never done  ? INFLUENZA VACCINE  07/23/2021  ? ? ?The following portions of the patient's history were reviewed and updated as appropriate: allergies, current medications, past family history, past medical history, past social history, past surgical history, and problem list. ? ?Review of Systems ?A comprehensive review of systems was negative. ? ?Objective:  ?  ? ?General:  alert, cooperative, appears stated age, and no distress  ? Breasts:  not indicated declined by patient  ?Lungs: clear to auscultation bilaterally  ?Heart:  regular rate and rhythm, S1, S2 normal, no murmur, click, rub or gallop  ?     ?Assessment:  ? ?Normal postpartum exam.  ?Reviewed all forms of birth control options available hormonal contraceptive medication including pill, patch, ring, injection,contraceptive implant; hormonal and nonhormonal IUDs. Abstinence and permanent sterilization options not reviewed per patient goals. Risks and benefits reviewed.  Questions were answered.  Information was given to patient to review.  Considering Nuvaring vs Slynd at end of visit ?Negative UPT in office s/p unprotected sex x 2 ? ?Plan:  ? ?Essential components of care per ACOG recommendations: ? ?1.  Mood and well being: Patient with negative depression screening today. Reviewed local resources for support.  ?- Patient tobacco use? No.   ?- hx of drug use? No.   ? ?2. Infant care and feeding:  ?-Patient currently breastmilk feeding? Yes. Reviewed importance of draining breast regularly to support lactation.  ?-Social determinants of health (SDOH) reviewed in EPIC. No concerns ? ?3. Sexuality, contraception and birth spacing ?- Patient does not want a pregnancy in the next year.   ? ?4. Sleep and fatigue ?-Encouraged family/partner/community support of 4 hrs of uninterrupted sleep to help with mood and fatigue ? ?5. Physical Recovery  ?- Discussed patients delivery and complications. She describes her labor as good. ?- Patient had a Vaginal, no problems at delivery. Patient had an intact perineum. Perineal healing reviewed. Patient expressed understanding ?- Patient has urinary incontinence? No. ?- Patient is safe to resume physical and sexual activity ? ?6.  Health Maintenance ?- HM due items addressed No - not due ?- Last pap smear  ?Diagnosis  ?Date Value Ref Range Status  ?07/25/2021   Final  ? - Negative for intraepithelial lesion or malignancy (NILM)  ? Pap smear not done at today's visit.  ?-Breast Cancer screening indicated? No.  ? ?7. Chronic Disease/Pregnancy Condition follow up: None ? ?- PCP follow up ? ?Patient to contact office for contraception choice as needed ? ?Mallie Snooks, MSA, MSN, CNM ?Certified Nurse Midwife, [place] [facility] [place] ? ?

## 2022-03-17 IMAGING — US US MFM FETAL BPP W/O NON-STRESS
1 series · 14 of 28 positions shown · non-contrast
Comparison: none

[Series 1: us mfm fetal bpp w/o non-stress · 52 acquisitions, 14 frames shown]
[im 2/52]
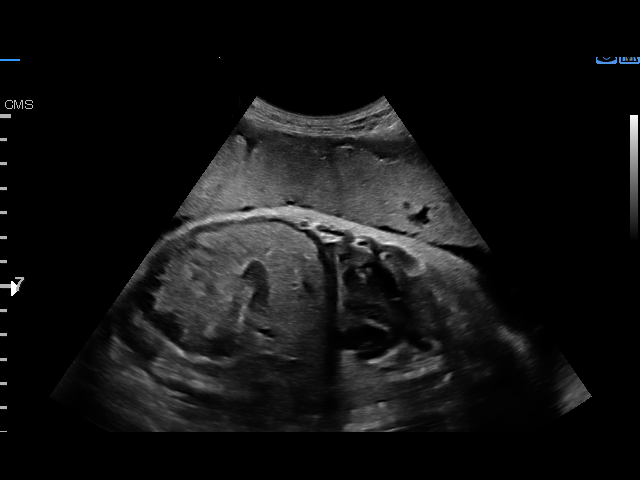
[im 6/52]
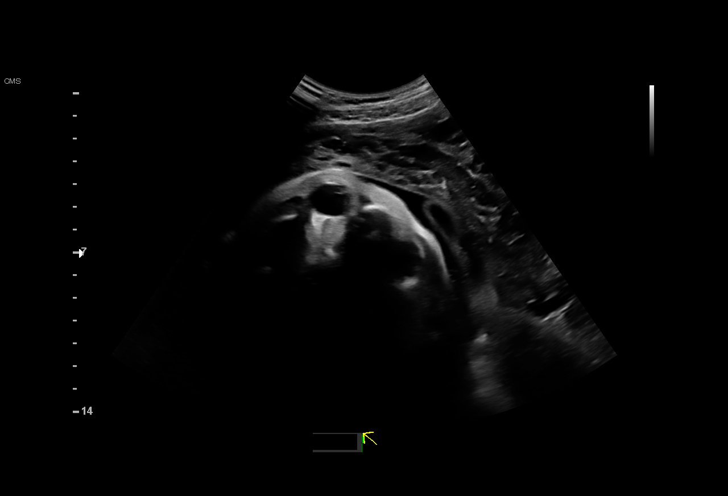
[im 10/52]
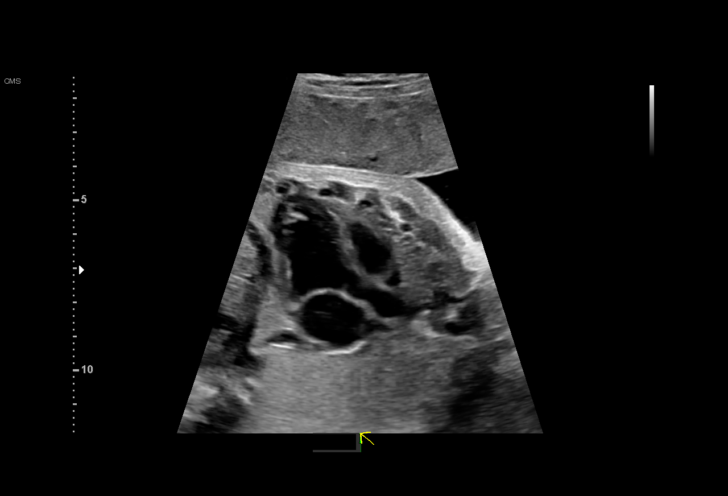
[im 14/52]
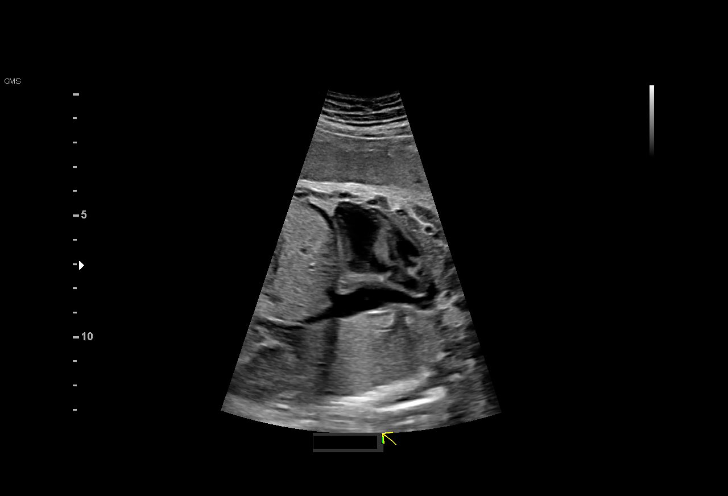
[im 18/52]
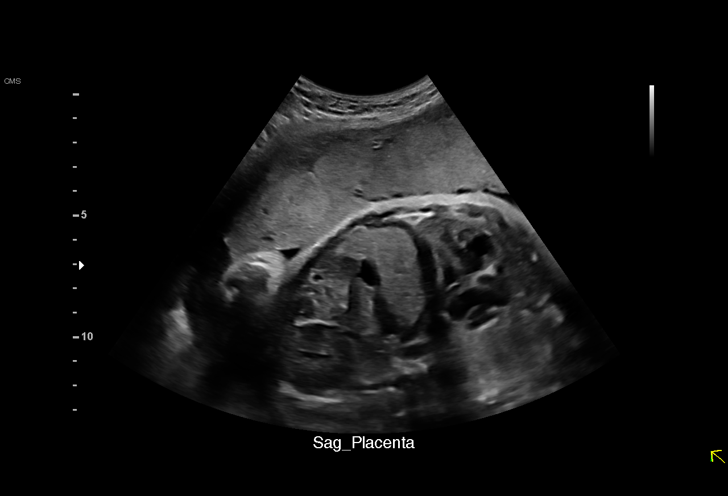
[im 21/52]
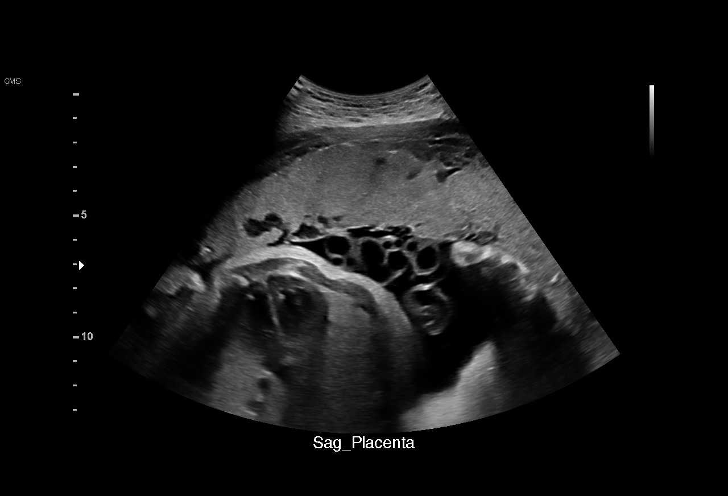
[im 25/52]
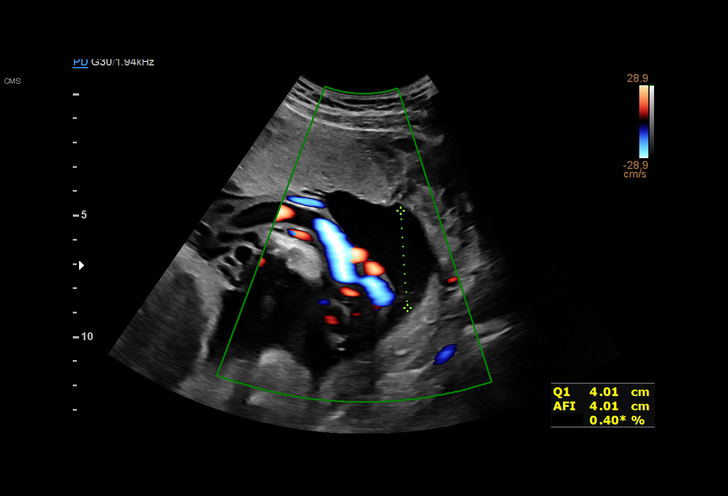
[im 29/52]
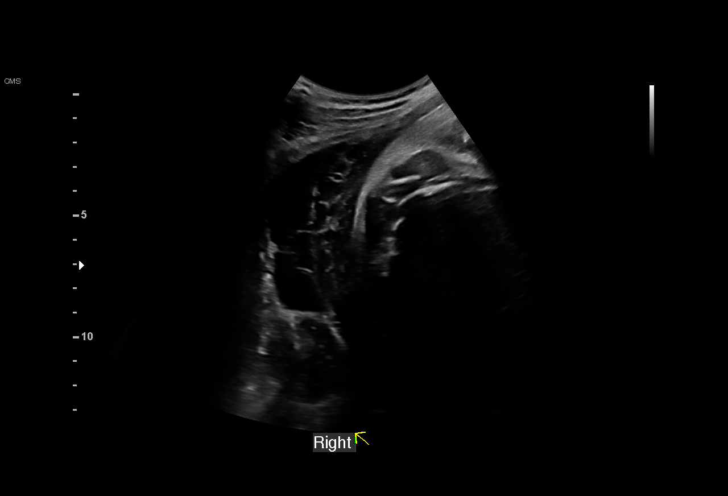
[im 33/52]
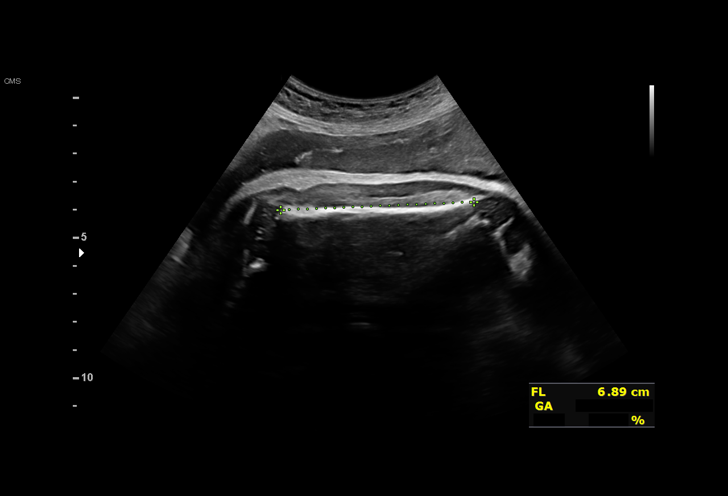
[im 36/52]
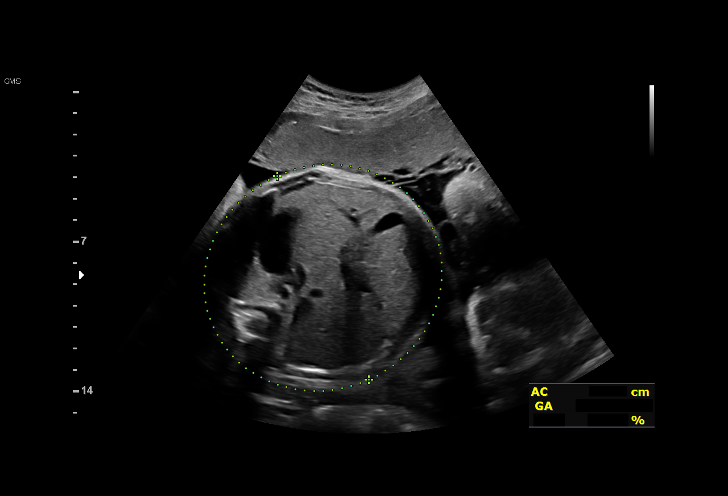
[im 40/52]
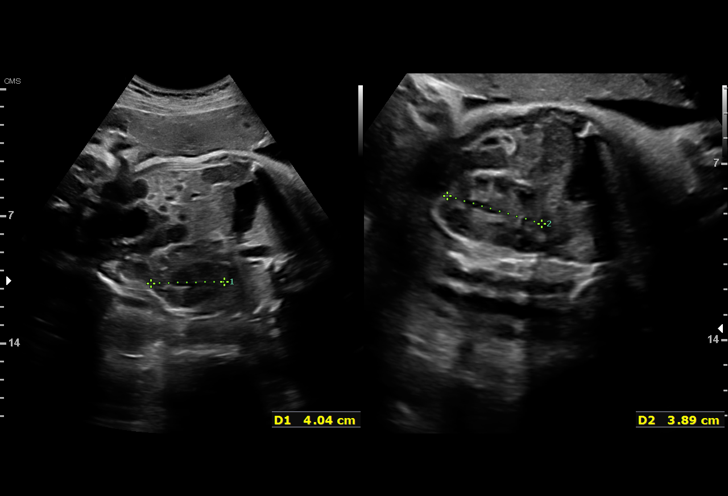
[im 44/52]
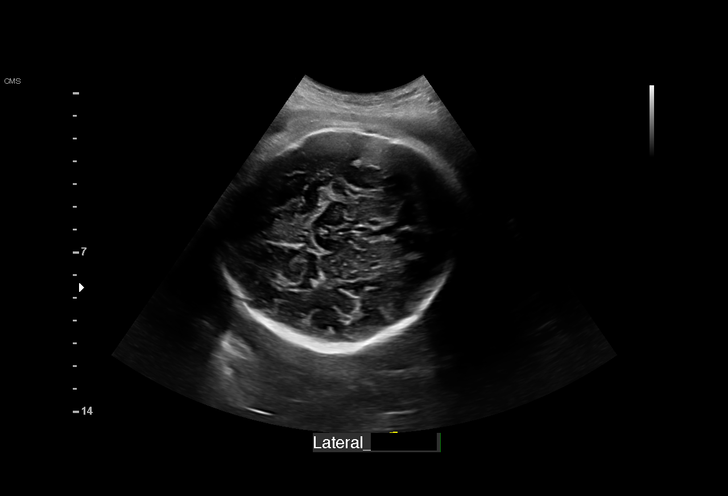
[im 48/52]
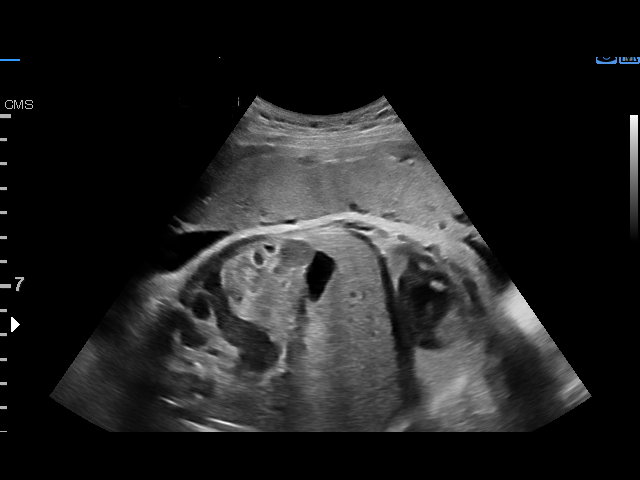
[im 52/52]
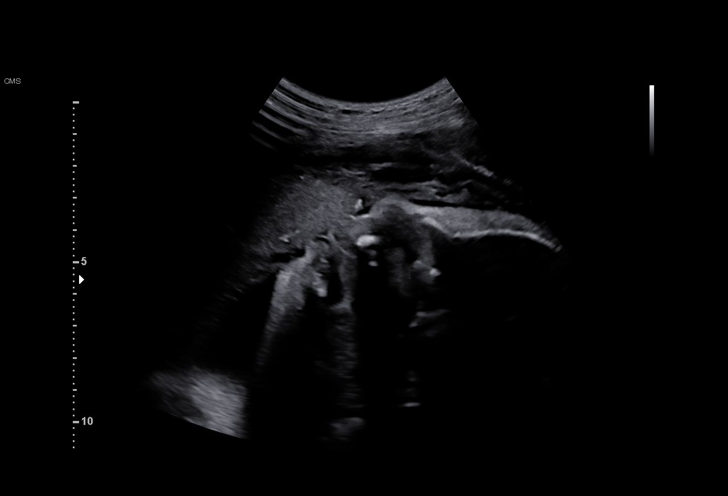

[14 of 28 positions shown; findings below may reference images not displayed]

Indications

 35 weeks gestation of pregnancy
 Hyperthyroid (No Meds)
 Genetic carrier ([place] muscular
 dystrophy)
 Asthma                                         7FF.RF [id number]
 Low Risk NIPS(Negative Horizon)
 Encounter for other antenatal screening
 follow-up
 Genetic abnormality (DMD by amnio)
Fetal Evaluation

 Num Of Fetuses:         1
 Fetal Heart Rate(bpm):  126
 Cardiac Activity:       Observed
 Presentation:           Cephalic
 Placenta:               Anterior
 P. Cord Insertion:      Previously Visualized

 Amniotic Fluid
 AFI FV:      Within normal limits

 AFI Sum(cm)     %Tile       Largest Pocket(cm)
 13.2            45          4

 RUQ(cm)       RLQ(cm)       LUQ(cm)        LLQ(cm)
 4
Biophysical Evaluation

 Amniotic F.V:   Within normal limits       F. Tone:        Observed
 F. Movement:    Observed                   Score:          [DATE]
 F. Breathing:   Observed
Biometry

 BPD:      94.2  mm     G. Age:  38w 3d         98  %    CI:        79.77   %    70 - 86
                                                         FL/HC:      20.4   %    20.1 -
 HC:      333.3  mm     G. Age:  38w 0d         73  %    HC/AC:      0.99        0.93 -
 AC:       337   mm     G. Age:  37w 4d         94  %    FL/BPD:     72.3   %    71 - 87
 FL:       68.1  mm     G. Age:  35w 0d         23  %    FL/AC:      20.2   %    20 - 24
 LV:        2.8  mm

 Est. FW:    4244  gm    6 lb 14 oz      83  %
OB History

 Gravidity:    5         Term:   3        Prem:   0        SAB:   1
 TOP:          0       Ectopic:  0        Living: 3
Gestational Age

 U/S Today:     37w 2d                                        EDD:   01/20/22
 Best:          35w 6d     Det. By:  Early Ultrasound         EDD:   01/30/22
                                     (06/10/21)
Anatomy

 Cranium:               Appears normal         LVOT:                   Appears normal
 Cavum:                 Appears normal         Aortic Arch:            Previously seen
 Ventricles:            Appears normal         Ductal Arch:            Appears normal
 Choroid Plexus:        Appears normal         Diaphragm:              Appears normal
 Cerebellum:            Previously seen        Stomach:                Appears normal, left
                                                                       sided
 Posterior Fossa:       Previously seen        Abdomen:                Previously seen
 Nuchal Fold:           Previously seen        Abdominal Wall:         Previously seen
 Face:                  Appears normal         Cord Vessels:           Previously seen
                        (orbits and profile)
 Lips:                  Appears normal         Kidneys:                Appear normal
 Palate:                Previously seen        Bladder:                Appears normal
 Thoracic:              Previously seen        Spine:                  Previously seen
 Heart:                 Appears normal         Upper Extremities:      Previously seen
                        (4CH, axis, and
                        situs)
 RVOT:                  Appears normal         Lower Extremities:      Previously seen

 Other:  Male gender previously seen. Nasal bone, lenses, 3VV, 3VTV and VC
         visualized. Heels prev visualized.
Cervix Uterus Adnexa

 Cervix
 Not visualized (advanced GA >59wks)
 Uterus
 No abnormality visualized.

 Right Ovary
 Not visualized.

 Left Ovary
 Not visualized.

 Cul De Sac
 No free fluid seen.

 Adnexa
 No abnormality visualized.
Impression

 Amniocentesis confirmed fetal DMD.
 Amniotic fluid is normal and good fetal activity is seen .Fetal
 growth is appropriate for gestational age .Antenatal testing is
 reassuring. BPP [DATE].
Recommendations

 -Continue weekly BPP till delivery.
                 Valek, Andreea Manuela

## 2022-11-11 ENCOUNTER — Ambulatory Visit: Payer: Medicaid Other | Attending: Cardiology | Admitting: Cardiology

## 2022-11-11 ENCOUNTER — Encounter: Payer: Self-pay | Admitting: Cardiology

## 2022-11-11 VITALS — BP 112/68 | HR 69 | Ht 63.0 in | Wt 160.4 lb

## 2022-11-11 DIAGNOSIS — R55 Syncope and collapse: Secondary | ICD-10-CM | POA: Diagnosis not present

## 2022-11-11 DIAGNOSIS — R0602 Shortness of breath: Secondary | ICD-10-CM | POA: Diagnosis not present

## 2022-11-11 DIAGNOSIS — Z148 Genetic carrier of other disease: Secondary | ICD-10-CM | POA: Diagnosis not present

## 2022-11-11 DIAGNOSIS — R002 Palpitations: Secondary | ICD-10-CM | POA: Diagnosis not present

## 2022-11-11 NOTE — Progress Notes (Unsigned)
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  Her great uncle has fully expressed Duchenne's but no other family members. EF by echo 09/2019 was 60-65%.  Cardiac MRI 12/04/2020 showed mildly dilated LV with normal LV systolic function EF 78% with no late gadolinium enhancement of the LV, normal RV size and function, normal biatrial size, mild MR and trivial TR and trivial pericardial effusion  When I last saw her she was [redacted] weeks pregnant and was complaining of of random sharp CP in her chest lasting a few seconds and would resolve when she would reposition herself.   She was also complaining of shortness of breath and palpitations.  Heart monitor was worn which showed normal sinus rhythm with rare PVCs and no correlation with patient's symptoms and arrhythmias.  TSH was normal at the time.  2D echo was ordered but does not appear to have been completed./It was felt that her shortness of breath was likely related to pregnancy  She is here today for followup and is doing well.  She occasionally will have some SOB related to her asthma but that is only sporadic. She occasionally will have a sharp quick pain in her chest that resolves with drinking water.  She denies any exertional chest pain or pressure, PND, orthopnea, LE edema or syncope. She occasionally has a flip flop of her heart beat for a few seconds.  She is compliant with her meds and is tolerating meds with no SE.    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   Needs genetic counseling   Eczema 10/04/2014   Faintness 10/05/2015   Family dynamics problem 03/25/2013   Fetal tachycardia affecting management of mother 09/21/2018   GBS (group B Streptococcus carrier), +RV culture, currently pregnant 11/14/2015   GERD (gastroesophageal reflux disease) 09/21/2015   H/O scabies 11/14/2015   Treated with permetherin    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   Loss of weight 03/25/2013   Low back pain 09/20/2013   Menorrhagia 09/28/2013   Migraine headache 02/23/2014   Sleep apnea 04/14/2014   Substance abuse ([place]) 03/24/2015   + cannabis    Supervision of low-risk pregnancy 06/28/2019    Nursing Staff Provider Office Location  [facility] Dating   early u/s Language  English Anatomy US   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  Flu Vaccine   Genetic Screen  NIPS: low risk female      TDaP vaccine    Hgb A1C or  GTT  Third trimester  Rhogam  n/a   LAB RESULTS  Feeding Plan Breast/Bottle Blood Type A/Positive/-- (07/16 1206)  Contraception undecided Antibody Negative (07/16   Thyroid disease     Past Surgical History:  Procedure Laterality Date   TOOTH EXTRACTION     WRIST SURGERY  08/30/2020    Current Medications: No outpatient  medications have been marked as taking for the 11/11/22 encounter (Office Visit) with Sueanne Margarita, MD.    Allergies:   Azithromycin   Social History   Socioeconomic History   Marital status: Single    Spouse name: Not on file   Number of children: Not on file   Years of education: 9th   Highest education level: Not on file  Occupational History    Employer: UNEMPLOYED  Tobacco Use   Smoking status: Never   Smokeless tobacco: Never  Vaping Use   Vaping Use: Never used  Substance and Sexual Activity   Alcohol use: No    Alcohol/week: 0.0 standard drinks of alcohol   Drug use: No   Sexual activity: Not Currently    Birth control/protection:  None  Other Topics Concern   Not on file  Social History Narrative   Not on file   Social Determinants of Health   Financial Resource Strain: Not on file  Food Insecurity: No Food Insecurity (11/27/2021)   Hunger Vital Sign    Worried About Running Out of Food in the Last Year: Never true    Ran Out of Food in the Last Year: Never true  Transportation Needs: No Transportation Needs (11/27/2021)   PRAPARE - Hydrologist (Medical): No    Lack of Transportation (Non-Medical): No  Physical Activity: Not on file  Stress: Not on file  Social Connections: Not on file     Family History:  The patient's family history includes Asthma in her father; Cancer - Colon in her maternal grandfather; Diabetes in her maternal grandmother; Heart disease in her paternal grandfather; Hyperlipidemia in her paternal grandfather.   ROS:   Please see the history of present illness.    ROS All other systems reviewed and are negative.      No data to display             PHYSICAL EXAM:   VS:  BP 112/68   Pulse 69   Ht $R'5\' 3"'oy$  (1.6 m)   Wt 160 lb 6.4 oz (72.8 kg)   SpO2 98%   BMI 28.41 kg/m    GEN: Well nourished, well developed in no acute distress HEENT: Normal NECK: No JVD; No carotid bruits LYMPHATICS: No lymphadenopathy CARDIAC:RRR, no murmurs, rubs, gallops RESPIRATORY:  Clear to auscultation without rales, wheezing or rhonchi  ABDOMEN: Soft, non-tender, non-distended MUSCULOSKELETAL:  No edema; No deformity  SKIN: Warm and dry NEUROLOGIC:  Alert and oriented x 3 PSYCHIATRIC:  Normal affect   Wt Readings from Last 3 Encounters:  11/11/22 160 lb 6.4 oz (72.8 kg)  02/26/22 149 lb (67.6 kg)  01/17/22 156 lb 3.2 oz (70.9 kg)      Studies/Labs Reviewed:   EKG:  EKG is ordered today and demonstrates NSR with no ST changes  Recent Labs: 01/17/2022: Hemoglobin 12.6; Platelets 176   Lipid Panel No results found for: "CHOL", "TRIG", "HDL", "CHOLHDL",  "VLDL", "LDLCALC", "LDLDIRECT"  Additional studies/ records that were reviewed today include:  Office notes from OB    ASSESSMENT:    1. 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 Syncope, unspecified syncope type   3. Palpitations   4. SOB (shortness of breath)      PLAN:  In order of problems listed above:  Carrier of Duchenne's muscular dystrophy -EF by echo 09/2019 was 60-65% -Cardiac MRI 12/04/2020 showed mildly dilated LV with normal LV systolic function EF 78% with no late gadolinium  enhancement of the LV, normal RV size and function, normal biatrial size, mild MR and trivial TR and trivial pericardial effusion -She was to have a repeat 2D echo done at the time of her last office visit when she was complaining of shortness of breath in the setting of [redacted] weeks pregnant but this was never completed -I will repeat 2D echo to make sure LV function remains stable  2.  Syncope -vasovagal in origin and both times occurred while pregnant and after going to the bathroom -she gives a hx of syncope while getting nipples pierced and said she passed out because of the pain -she had another episode of syncope  but was attributed to dehydration in setting of pregnancy -encouraged her to stay hydrated -no arrhythmias on heart monitor  -No further episodes of syncope  3.  Palpitations -Heart monitor demonstrated rare PVCs but no correlation of arrhythmias with symptoms -she occasionally has a flip flop in her heart beat but nothing sustained  4.  SOB -likely related to pregnancy and has resolved post pregnancy -given her Duchennes dystrophy will get a 2D echo to make sure LVF is preserved  Medication Adjustments/Labs and Tests Ordered: Current medicines are reviewed at length with the patient today.  Concerns regarding medicines are outlined above.  Medication changes, Labs and Tests ordered today are listed in the Patient Instructions below.  There are no Patient Instructions on  file for this visit.   Signed, Fransico Him, MD  11/11/2022 4:00 PM    [address], [place], [place]  [address] Phone: [phone]; Fax: [phone]

## 2022-11-11 NOTE — Patient Instructions (Signed)
Medication Instructions:  Your physician recommends that you continue on your current medications as directed. Please refer to the Current Medication list given to you today.  *If you need a refill on your cardiac medications before your next appointment, please call your pharmacy*   Testing/Procedures: ECHO Your physician has requested that you have an echocardiogram. Echocardiography is a painless test that uses sound waves to create images of your heart. It provides your doctor with information about the size and shape of your heart and how well your heart's chambers and valves are working. This procedure takes approximately one hour. There are no restrictions for this procedure. Please do NOT wear cologne, perfume, aftershave, or lotions (deodorant is allowed). Please arrive 15 minutes prior to your appointment time.    Follow-Up: At [facility], you and your health needs are our priority.  As part of our continuing mission to provide you with exceptional heart care, we have created designated Provider Care Teams.  These Care Teams include your primary Cardiologist (physician) and Advanced Practice Providers (APPs -  Physician Assistants and Nurse Practitioners) who all work together to provide you with the care you need, when you need it.  Your next appointment:   1 year(s)  The format for your next appointment:   In Person  Provider:   Fransico Him, MD     Important Information About Sugar

## 2022-12-03 ENCOUNTER — Ambulatory Visit (HOSPITAL_COMMUNITY): Payer: Medicaid Other | Attending: Cardiology

## 2022-12-03 DIAGNOSIS — R55 Syncope and collapse: Secondary | ICD-10-CM

## 2022-12-03 DIAGNOSIS — R0602 Shortness of breath: Secondary | ICD-10-CM | POA: Diagnosis not present

## 2022-12-03 DIAGNOSIS — Z148 Genetic carrier of other disease: Secondary | ICD-10-CM | POA: Insufficient documentation

## 2022-12-04 LAB — ECHOCARDIOGRAM COMPLETE
Area-P 1/2: 3.95 cm2
S' Lateral: 3.2 cm

## 2023-01-07 ENCOUNTER — Other Ambulatory Visit (HOSPITAL_COMMUNITY)
Admission: RE | Admit: 2023-01-07 | Discharge: 2023-01-07 | Disposition: A | Discharge status: Home / Self Care | Payer: Medicaid Other | Source: Ambulatory Visit | Attending: Family Medicine | Admitting: Family Medicine

## 2023-01-07 ENCOUNTER — Ambulatory Visit (INDEPENDENT_AMBULATORY_CARE_PROVIDER_SITE_OTHER): Payer: Medicaid Other | Admitting: *Deleted

## 2023-01-07 VITALS — BP 132/68 | HR 70 | Ht 63.0 in | Wt 155.8 lb

## 2023-01-07 DIAGNOSIS — Z202 Contact with and (suspected) exposure to infections with a predominantly sexual mode of transmission: Secondary | ICD-10-CM | POA: Diagnosis not present

## 2023-01-07 DIAGNOSIS — Z3202 Encounter for pregnancy test, result negative: Secondary | ICD-10-CM

## 2023-01-07 DIAGNOSIS — N898 Other specified noninflammatory disorders of vagina: Secondary | ICD-10-CM | POA: Insufficient documentation

## 2023-01-07 LAB — POCT PREGNANCY, URINE: Preg Test, Ur: NEGATIVE

## 2023-01-07 NOTE — Progress Notes (Signed)
Here for self swab , thinks she may have BV Or STD. C/o some vaginal discharge and cramping which is what she has with BV, but also concerned she may have exposed to an STD. She wants to do complete STD testing including bloodwork. Explained if positive , she will be contacted. Also advised we recommend annual exam every 1-2 years and pap every 3 years , so recommend annual within next year. She also asked to do pregnancy test , which was negative.  Staci Acosta

## 2023-01-08 ENCOUNTER — Other Ambulatory Visit: Payer: Self-pay | Admitting: Obstetrics and Gynecology

## 2023-01-08 DIAGNOSIS — B3731 Acute candidiasis of vulva and vagina: Secondary | ICD-10-CM

## 2023-01-08 LAB — CERVICOVAGINAL ANCILLARY ONLY
Bacterial Vaginitis (gardnerella): NEGATIVE
Candida Glabrata: NEGATIVE
Candida Vaginitis: POSITIVE — AB
Chlamydia: NEGATIVE
Comment: NEGATIVE
Comment: NEGATIVE
Comment: NEGATIVE
Comment: NEGATIVE
Comment: NEGATIVE
Comment: NORMAL
Neisseria Gonorrhea: NEGATIVE
Trichomonas: NEGATIVE

## 2023-01-08 LAB — RPR: RPR Ser Ql: NONREACTIVE

## 2023-01-08 LAB — HIV ANTIBODY (ROUTINE TESTING W REFLEX): HIV Screen 4th Generation wRfx: NONREACTIVE

## 2023-01-08 LAB — HEPATITIS B SURFACE ANTIGEN: Hepatitis B Surface Ag: NEGATIVE

## 2023-01-08 MED ORDER — FLUCONAZOLE 150 MG PO TABS: 150.0000 mg | ORAL_TABLET | Freq: Once | ORAL | 0 refills | Status: AC

## 2023-02-03 NOTE — BH Specialist Note (Signed)
Integrated Behavioral Health via Telemedicine Visit  02/13/2023 [name] [id number]  Number of [place] Clinician visits: 1- Initial Visit  Session Start time: 0867   Session End time: 6195  Total time in minutes: 58   Referring Provider: Darliss Cheney, MD Patient/Family location: Home [facility] Provider location: Center for [place] at [facility]  All persons participating in visit: [name] and [place]   Types of Service: Individual psychotherapy and Video visit  I connected with Angela Branch and/or Angela Branch's  n/a  via  Telephone or Geologist, engineering  (Video is Tree surgeon) and verified that I am speaking with the correct person using two identifiers. Discussed confidentiality: Yes   I discussed the limitations of telemedicine and the availability of in person appointments.  Discussed there is a possibility of technology failure and discussed alternative modes of communication if that failure occurs.  I discussed that engaging in this telemedicine visit, they consent to the provision of behavioral healthcare and the services will be billed under their insurance.  Patient and/or legal guardian expressed understanding and consented to Telemedicine visit: Yes   Presenting Concerns: Patient and/or family reports the following symptoms/concerns: Ongoing anger and irritability, attributed to childhood trauma to present life stress and putting her own needs aside for others. Pt's goals are to find balance in life, have greater understanding of the underlying issues leading to anger, and manage household responsibilities without feeling so overwhelmed.  Duration of problem: Ongoing; Severity of problem: moderate  Patient and/or Family's Strengths/Protective Factors: Sense of purpose  Goals Addressed: Patient will:  Reduce symptoms of: anxiety, depression,  and stress   Increase knowledge and/or ability of: stress reduction   Demonstrate ability to: Increase healthy adjustment to current life circumstances  Progress towards Goals: Ongoing  Interventions: Interventions utilized:  Solution-Focused Strategies Standardized Assessments completed: Not Needed  Patient and/or Family Response: Patient agrees with treatment plan.   Assessment: Patient currently experiencing Mood disorder, unspecified; Psychosocial stress.   Patient may benefit from psychoeducation and brief therapeutic interventions regarding coping with symptoms of anxiety, depression, life stress .  Plan: Follow up with behavioral health clinician on : Three weeks Behavioral recommendations:  -Plan to use new crock pot (and paper plates) at least once/week in the next three weeks to help free up some time/energy -Consider plan to file taxes and have self-care time set aside at least once in the next three weeks, as discussed -Consider taking ACES questionnaire (information on After Visit Summary)  -Consider putting in phone (text to self or in file) possible reasons for anger, as soon as they come to mind; will discuss further at next visit Referral(s): [place] (In Clinic)  I discussed the assessment and treatment plan with the patient and/or parent/guardian. They were provided an opportunity to ask questions and all were answered. They agreed with the plan and demonstrated an understanding of the instructions.   They were advised to call back or seek an in-person evaluation if the symptoms worsen or if the condition fails to improve as anticipated.  Angela Branch Angela Donado, LCSW

## 2023-02-13 ENCOUNTER — Ambulatory Visit (INDEPENDENT_AMBULATORY_CARE_PROVIDER_SITE_OTHER): Payer: Medicaid Other | Admitting: Clinical

## 2023-02-13 ENCOUNTER — Other Ambulatory Visit (HOSPITAL_COMMUNITY)
Admission: RE | Admit: 2023-02-13 | Discharge: 2023-02-13 | Disposition: A | Discharge status: Home / Self Care | Payer: Medicaid Other | Source: Ambulatory Visit | Attending: Family Medicine | Admitting: Family Medicine

## 2023-02-13 ENCOUNTER — Ambulatory Visit (INDEPENDENT_AMBULATORY_CARE_PROVIDER_SITE_OTHER): Payer: Medicaid Other

## 2023-02-13 VITALS — BP 111/73 | HR 83 | Ht 63.0 in | Wt 153.7 lb

## 2023-02-13 DIAGNOSIS — F39 Unspecified mood [affective] disorder: Secondary | ICD-10-CM

## 2023-02-13 DIAGNOSIS — N898 Other specified noninflammatory disorders of vagina: Secondary | ICD-10-CM

## 2023-02-13 DIAGNOSIS — Z658 Other specified problems related to psychosocial circumstances: Secondary | ICD-10-CM

## 2023-02-13 NOTE — Patient Instructions (Addendum)
Center for [facility] at [facility] [place], [place] [address] [phone] (main office) [phone] ([place] office)  Adverse Childhood Experience (ACES)  [email or web address]

## 2023-02-13 NOTE — Progress Notes (Signed)
Pt here today with c/o of dull pain in her abdomen and vaginal discharge.  Pt states that she knows her body and that when she feels this way its usually BV.  Pt states that she tested before and was yeast but even after taking the yeast treatment she continues to have the same feeling.  Pt explained how to obtain the self swab and that we will call with abnormal results on Monday if needed.  Pt verbalized understanding with no further questions.  Pt states that if she needs treatment for BV she would like the Metrogel and not the tablets.    Angela Branch  02/13/23

## 2023-02-14 LAB — CERVICOVAGINAL ANCILLARY ONLY
Bacterial Vaginitis (gardnerella): POSITIVE — AB
Candida Glabrata: NEGATIVE
Candida Vaginitis: NEGATIVE
Chlamydia: NEGATIVE
Comment: NEGATIVE
Comment: NEGATIVE
Comment: NEGATIVE
Comment: NEGATIVE
Comment: NEGATIVE
Comment: NORMAL
Neisseria Gonorrhea: NEGATIVE
Trichomonas: NEGATIVE

## 2023-02-17 ENCOUNTER — Encounter: Payer: Self-pay | Admitting: Obstetrics and Gynecology

## 2023-02-17 ENCOUNTER — Telehealth: Payer: Self-pay | Admitting: Family Medicine

## 2023-02-17 ENCOUNTER — Other Ambulatory Visit: Payer: Self-pay | Admitting: Obstetrics and Gynecology

## 2023-02-17 DIAGNOSIS — B9689 Other specified bacterial agents as the cause of diseases classified elsewhere: Secondary | ICD-10-CM

## 2023-02-17 MED ORDER — METRONIDAZOLE 0.75 % VA GEL: 1.0000 | Freq: Every day | VAGINAL | 0 refills | Status: DC

## 2023-02-17 MED ORDER — METRONIDAZOLE 500 MG PO TABS: 500.0000 mg | ORAL_TABLET | Freq: Two times a day (BID) | ORAL | 0 refills | Status: DC

## 2023-02-17 NOTE — Telephone Encounter (Signed)
Patient's concerns addressed via mychart

## 2023-02-17 NOTE — Telephone Encounter (Signed)
Patient called in having some questions about her prescription that was sent in.

## 2023-02-20 NOTE — BH Specialist Note (Unsigned)
Integrated Behavioral Health via Telemedicine Visit  03/06/2023 Angela Branch [id number]  Number of [place] Clinician visits: 2- Second Visit  Session Start time: 5277   Session End time: 8242  Total time in minutes: 70   Referring Provider: Darliss Cheney, MD Patient/Family location: Home [facility] Provider location: Center for [place] at [facility]  All persons participating in visit: [name] and [place]   Types of Service: Individual psychotherapy and Video visit  I connected with Angela Branch and/or Angela Branch's  n/a  via  Telephone or Geologist, engineering  (Video is Tree surgeon) and verified that I am speaking with the correct person using two identifiers. Discussed confidentiality: Yes   I discussed the limitations of telemedicine and the availability of in person appointments.  Discussed there is a possibility of technology failure and discussed alternative modes of communication if that failure occurs.  I discussed that engaging in this telemedicine visit, they consent to the provision of behavioral healthcare and the services will be billed under their insurance.  Patient and/or legal guardian expressed understanding and consented to Telemedicine visit: Yes   Presenting Concerns: Patient and/or family reports the following symptoms/concerns: Processing ongoing trust and boundary issues with mother and FOB and being "surrounded by narcissists"; was able to file taxes and take ACES questionnaire for greater awareness of trauma impact. Pt intends to break generational trauma; requests additional resources. Duration of problem: Ongoing; Severity of problem: moderate  Patient and/or Family's Strengths/Protective Factors: Sense of purpose  Goals Addressed: Patient will:  Reduce symptoms of: anxiety, depression, and stress   Increase knowledge and/or  ability of:  healthy boundaries    Demonstrate ability to: Increase healthy adjustment to current life circumstances and set healthy boundaries  Progress towards Goals: Ongoing  Interventions: Interventions utilized:  Armed forces logistics/support/administrative officer and Supportive Reflection Standardized Assessments completed: Not Needed  Patient and/or Family Response: Patient agrees with treatment plan.   Assessment: Patient currently experiencing Mood disorder, unspecified; Psychosocial stress.   Patient may benefit from continued therapeutic interventions.  Plan: Follow up with behavioral health clinician on : Two weeks Behavioral recommendations:  -Consider setting healthy boundaries with mother and FOB as discussed:   -Know that you deserve to feel safe and treated with respect in all close relationships. Start by treating yourself with respect.  -Know that forgiving others does not mean forgetting past negative words and behaviors and allowing future hurtful words and behaviors.   -Continue journey towards breaking generational trauma. Read through After Visit Summary resources; use as needed.  Referral(s): [place] (In Clinic) and [employer] Resources:  parenting  I discussed the assessment and treatment plan with the patient and/or parent/guardian. They were provided an opportunity to ask questions and all were answered. They agreed with the plan and demonstrated an understanding of the instructions.   They were advised to call back or seek an in-person evaluation if the symptoms worsen or if the condition fails to improve as anticipated.  [place], LCSW     02/27/2023    1:20 PM 11/27/2021    5:04 PM 09/20/2021    4:11 PM 04/02/2021    3:40 PM 09/18/2020    3:21 PM  Depression screen PHQ 2/9  Decreased Interest 0 0 0 0 0  Down, Depressed, Hopeless 0 0 0 0 0  PHQ - 2 Score 0 0 0 0 0  Altered sleeping 0 0 0 0  0  Tired, decreased energy 0 0 0 0 0  Change in  appetite 0 0 0 0 0  Feeling bad or failure about yourself  0 0 0 0 0  Trouble concentrating 0 0 0 0 0  Moving slowly or fidgety/restless 0 0 0 0 0  Suicidal thoughts 0 0 0 0 0  PHQ-9 Score 0 0 0 0 0  Difficult doing work/chores    Not difficult at all       02/27/2023    1:20 PM 11/27/2021    5:05 PM 09/20/2021    4:11 PM 04/02/2021    3:40 PM  GAD 7 : Generalized Anxiety Score  Nervous, Anxious, on Edge 0 0 0 0  Control/stop worrying 0 0 0 0  Worry too much - different things 0 0 0 0  Trouble relaxing 0 0 0 0  Restless 0 0 0 0  Easily annoyed or irritable 0 0 0 0  Afraid - awful might happen 0 0 0 0  Total GAD 7 Score 0 0 0 0

## 2023-02-27 ENCOUNTER — Encounter: Payer: Self-pay | Admitting: Obstetrics and Gynecology

## 2023-02-27 ENCOUNTER — Other Ambulatory Visit: Payer: Self-pay

## 2023-02-27 ENCOUNTER — Ambulatory Visit: Payer: Medicaid Other | Admitting: Obstetrics and Gynecology

## 2023-02-27 VITALS — BP 104/65 | HR 87 | Ht 63.0 in | Wt 156.0 lb

## 2023-02-27 DIAGNOSIS — Z30015 Encounter for initial prescription of vaginal ring hormonal contraceptive: Secondary | ICD-10-CM | POA: Diagnosis not present

## 2023-02-27 LAB — POCT PREGNANCY, URINE: Preg Test, Ur: NEGATIVE

## 2023-02-27 MED ORDER — ANNOVERA 0.013-0.15 MG/24HR VA RING: 1.0000 | VAGINAL_RING | Freq: Once | VAGINAL | 1 refills | Status: AC

## 2023-02-27 NOTE — Progress Notes (Signed)
GYNECOLOGY VISIT  Patient name: Angela Branch MRN [id number]  Date of birth: 02-09-1996 Chief Complaint:   Contraception   History:  Angela Branch is a 27 y.o. [id number] being seen today for contraception counseling. Had not been on birth control due to "extra things" on body  Prior birth control: nexplano, depo, nuva ring pills, IUD (non-hormonal) Baseline menses: just started menses, previously monthly menses, not terribly painful Next pregnancy: 5 years  Wants to see menses monthly    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   Needs genetic counseling   Eczema 10/04/2014   Faintness 10/05/2015   Family dynamics problem 03/25/2013   Fetal tachycardia affecting management of mother 09/21/2018   GBS (group B Streptococcus carrier), +RV culture, currently pregnant 11/14/2015   GERD (gastroesophageal reflux disease) 09/21/2015   H/O scabies 11/14/2015   Treated with permetherin    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   Loss of weight 03/25/2013   Low back pain 09/20/2013   Menorrhagia 09/28/2013   Migraine headache 02/23/2014   Sleep apnea 04/14/2014   Substance abuse ([place]) 03/24/2015   + cannabis    Supervision of low-risk pregnancy 06/28/2019    Nursing Staff Provider Office Location  [facility] Dating   early u/s Language  English Anatomy US   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  Flu Vaccine   Genetic Screen  NIPS: low risk female      TDaP vaccine    Hgb A1C or  GTT  Third trimester  Rhogam  n/a   LAB RESULTS  Feeding Plan Breast/Bottle Blood Type A/Positive/-- (07/16 1206)  Contraception undecided Antibody Negative (07/16   Thyroid disease     Past Surgical History:  Procedure Laterality Date   TOOTH EXTRACTION     WRIST SURGERY  08/30/2020    The following portions of the patient's  history were reviewed and updated as appropriate: allergies, current medications, past family history, past medical history, past social history, past surgical history and problem list.   Health Maintenance:   Last pap     Component Value Date/Time   DIAGPAP  07/25/2021 1504    - Negative for intraepithelial lesion or malignancy (NILM)   DIAGPAP  11/03/2018 0000    NEGATIVE FOR INTRAEPITHELIAL LESIONS OR MALIGNANCY.   ADEQPAP  07/25/2021 1504    Satisfactory for evaluation; transformation zone component PRESENT.   ADEQPAP  11/03/2018 0000    Satisfactory for evaluation  endocervical/transformation zone component PRESENT.     Last mammogram:n/a   Review of Systems:  Pertinent items are noted in HPI. Comprehensive review of systems was otherwise negative.   Objective:  Physical Exam BP 104/65   Pulse 87   Ht 5\' 3"  (1.6 m)   Wt 156 lb (70.8 kg)   LMP 02/23/2023   BMI 27.63 kg/m    Physical Exam Vitals and nursing note reviewed.  Constitutional:      Appearance: Normal appearance.  HENT:     Head: Normocephalic and atraumatic.  Pulmonary:     Effort: Pulmonary effort is normal.  Skin:    General: Skin is warm and dry.  Neurological:     General: No focal deficit present.     Mental Status: She is alert.  Psychiatric:  Mood and Affect: Mood normal.        Behavior: Behavior normal.        Thought Content: Thought content normal.        Judgment: Judgment normal.       Assessment & Plan:   1. Encounter for initial prescription of vaginal ring hormonal contraceptive Reviewed all forms of birth control options available including abstinence; over the counter/barrier methods; hormonal contraceptive medication including pill, patch, ring, injection,contraceptive implant; hormonal and nonhormonal IUDs; permanent sterilization options including vasectomy and the various tubal sterilization modalities. Risks and benefits reviewed.  Questions were answered.   Information was given to patient to review.   Pt opted for annovera ring. If not well tolerated, will switch to nuva ring or other form of contraception  - Segesterone-Ethinyl Estradiol (ANNOVERA) 0.15-0.013 MG/24HR RING; Place 1 Ring vaginally once for 1 dose. Can remove for 1 week each month for cycle  Dispense: 1 each; Refill: 1    Routine preventative health maintenance measures emphasized.  Darliss Cheney, MD [facility] [place]

## 2023-03-06 ENCOUNTER — Ambulatory Visit (INDEPENDENT_AMBULATORY_CARE_PROVIDER_SITE_OTHER): Payer: Medicaid Other | Admitting: Clinical

## 2023-03-06 DIAGNOSIS — Z658 Other specified problems related to psychosocial circumstances: Secondary | ICD-10-CM

## 2023-03-06 DIAGNOSIS — F39 Unspecified mood [affective] disorder: Secondary | ICD-10-CM

## 2023-03-06 NOTE — Patient Instructions (Addendum)
Center for [facility] Healthcare at [facility] for Women [place], [place] [address] [phone] (main office) [phone] [facility] office)  New Parent Support Groups www.postpartum.net www.conehealthybaby.com                   Parenting Resources  World Association for [place] www.waimh.org  Zero to Three www.zerotothree.[employer] for [employer] www.bootcampfornewdads.org   Circle of Security EugeneAttractions.com.ee   Happiest Baby on the Block www.happiestbaby.com  The [employer].thefussybabysite.[place]:  [address] 2nd floor, [place], [place] [address] [phone])  Main line [phone]  [place] location **Parking is available in the [address] parking deck.  [place]:  [address] [place], [place] [address] [phone]) Main line [phone]  [place] location  **Located on the backside of the [place]. Limited parking is available off of [address]  Patric Dykes hours: Monday -Friday 8:30am-4:30pm  MobileShades.de    If immediate emergency, call 9-1-1, or Family Service of the [place] 24 hour crisis hotline at: [phone]

## 2023-03-10 NOTE — BH Specialist Note (Signed)
Integrated Behavioral Health via Telemedicine Visit  03/24/2023 JEWELIANA BRUNGARD [id number]  Number of [place] Clinician visits: 3- Third Visit  Session Start time: [id number]   Session End time: [id number]  Total time in minutes: 53   Referring Provider: Darliss Cheney, MD Patient/Family location: [facility] Provider location: Center for [place] at [facility] for Women  All persons participating in visit: [name] and [place]   Types of Service: Individual psychotherapy and Video visit  I connected with Lyrik A Cuffee and/or Magali A Pilson's  n/a  via  Telephone or Geologist, engineering  (Video is Tree surgeon) and verified that I am speaking with the correct person using two identifiers. Discussed confidentiality: Yes   I discussed the limitations of telemedicine and the availability of in person appointments.  Discussed there is a possibility of technology failure and discussed alternative modes of communication if that failure occurs.  I discussed that engaging in this telemedicine visit, they consent to the provision of behavioral healthcare and the services will be billed under their insurance.  Patient and/or legal guardian expressed understanding and consented to Telemedicine visit: Yes   Presenting Concerns: Patient and/or family reports the following symptoms/concerns: Ongoing trust and boundary issues with mother; processing feelings regarding having upcoming discussion with mother about finding her own housing.  Duration of problem: Ongoing; Severity of problem: moderate  Patient and/or Family's Strengths/Protective Factors: Sense of purpose  Goals Addressed: Patient will:  Reduce symptoms of: anxiety, depression, and stress    Demonstrate ability to: Increase healthy adjustment to current life circumstances and Increase motivation to adhere to plan of care  Progress towards  Goals: Ongoing  Interventions: Interventions utilized:  Communication Skills Standardized Assessments completed: Not Needed  Patient and/or Family Response: Patient agrees with treatment plan.  Assessment: Patient currently experiencing Mood disorder, unspecified; Psychosocial stress.   Patient may benefit from continued therapeutic interventions.  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 They were provided an opportunity to ask questions and all were answered. They agreed with the plan and demonstrated an understanding of the instructions.   They were advised to call back or seek an in-person evaluation if the symptoms worsen or if the condition fails to improve as anticipated.  Caroleen Hamman Jordain Radin, LCSW     02/27/2023    1:20 PM 11/27/2021    5:04 PM 09/20/2021    4:11 PM 04/02/2021    3:40 PM 09/18/2020    3:21 PM  Depression screen PHQ 2/9  Decreased Interest 0 0 0 0 0  Down, Depressed, Hopeless 0 0 0 0 0  PHQ - 2 Score 0 0 0 0 0  Altered sleeping 0 0 0 0 0  Tired, decreased energy 0 0 0 0 0  Change in appetite 0 0 0 0 0  Feeling bad or failure about yourself  0 0 0 0 0  Trouble concentrating 0 0 0 0 0  Moving slowly or fidgety/restless 0 0 0 0 0  Suicidal thoughts 0 0 0 0 0  PHQ-9 Score 0 0 0 0 0  Difficult doing work/chores    Not difficult at all       02/27/2023    1:20 PM 11/27/2021    5:05 PM 09/20/2021    4:11 PM 04/02/2021    3:40 PM  GAD 7 :  Generalized Anxiety Score  Nervous, Anxious, on Edge 0 0 0 0  Control/stop worrying 0 0 0 0  Worry too much - different things 0 0 0 0  Trouble relaxing 0 0 0 0  Restless 0 0 0 0  Easily annoyed or irritable 0 0 0 0  Afraid - awful might happen 0 0 0 0  Total GAD 7 Score 0 0 0 0

## 2023-03-24 ENCOUNTER — Ambulatory Visit (INDEPENDENT_AMBULATORY_CARE_PROVIDER_SITE_OTHER): Payer: Medicaid Other | Admitting: Clinical

## 2023-03-24 DIAGNOSIS — F39 Unspecified mood [affective] disorder: Secondary | ICD-10-CM

## 2023-03-24 DIAGNOSIS — Z658 Other specified problems related to psychosocial circumstances: Secondary | ICD-10-CM

## 2023-03-24 NOTE — Patient Instructions (Signed)
[facility] at [facility] ?[address] ?[place], [place] [address] ?[phone] (main office) ?[phone] (Trevan Messman's office) ? ?Housing Resources ?                   ?[facility] [facility] (serves [place], [place], [place], [place], [place], [place], [place], [place], [place], [place], [place], [place], and [place] counties) ?[address], [place], [place] [address] ?([phone]) [phone] ?www.ptrc.org  ?**Rental assistance, Home Rehabilitation,Weatherization Assistance Program, Heating Appliance Repair and Replacement Program, Housing Voucher Program ? ?[employer] [facility] and Community Studies: ?Emergency Rental Assistance Resources to residents of [place], [place], and [place] ?Make sure you have your documents ready, including:  ?(Household income verification: 2 months pay stubs, unemployment/social security award letter, statement of no income for all household members over 18) ?Photo ID for all household members over 18 ?Utility Bill/Rent Ledger/Lease: must show past due amount for utilities/rent, or the rental agreement if rent is current ?2. Start your application online or by paper (in English or Spanish) at:  ?   [email or web address]  ?3. Once you have completed the online application, you will get an email confirmation message from the county. Expect to hear back by phone or email at least 6-10 weeks from submitting your application.  ?4. While you wait:  ?Call [phone] to check in on your application ?Let your landlord know that you've applied. Your landlord will be asked to submit documents (W-9) during this application process. Payments will be made directly to the landlord/property management company and utility company ?Rent or utility assistance for [place], [place], and [place] ?Apply at [email or web address] ?Questions? Call or email Renee at  [phone] or [email or web address]  ? ?Eviction Mediation Program: The HOPE Program ?Https://www.rebuild.[place].gov/hope-program ?HOPE Progam serves low-income renters in [place] counties, defined as less than or equal to 80% of the area median income for the county where the renter lives. In the following 12 counties, you should apply to your local rent and utility assistance program INSTEAD OF the HOPE Program: [place], [place], [place], [place], [place], [place], [place], [place], [place], [place], [place], [place]  ?If you live outside of [place], contact HOPE call center at [phone] to talk to a Program Representative Monday-Friday, 8am-5pm ?Note that Native American tribes also received federal funding for rent and utility assistance programs. Recognized members of the following tribes will be served by programs managed by tribal governments, including: Eastern Band of [place] Indians, [place] Tribe, [place] Indian Tribe, [place] of [place] and [place] Tribe ?Eviction Mediation Coordinator, Renee, [phone] [email or web address] ?Housing Resources Navigator, Stefanie, ([phone]) [phone] [email or web address]  ? ?Housing Resources [place] ? ?[facility] [place] ?[address], [place], [place] [address] ?(336) 275-8501 ?www.gha-Blackgum.org  ? ?[place] [employer] [facility] ?[address], [place], [place] [address] ?(336) 691-9521 ?www.gsohc.org ?**Programs include: Foreclosure Prevention and Housing Counseling, Healthy Homes/Tenant Advocacy, Homeless Prevention and Housing Assistance ? ?Government Services-[place] ?[address], [place], [place] [address] ?(336) 641-3383 ?www.[place].gov ?**housing applications/recertification; tax payment relief/exemption under specific qualifications ? ?[facility] ?[address] [place] [address], [place], [place] [address] ?www.online[place].com/~maryshouse ?**transitional housing for  women in recovery who have minor children or are pregnant ? ?[facility] [place] ?[address], [place], [place] [address] ?www.ywcagsonc.org  ?**emergency shelter and support services for families facing homelessness ? ?[facility] ?[address], ,  [address] ?(336) 375-1332 ?www.youthfocus.org ?**transitional housing to pregnant women; emergency housing for youth who have run away, are experiencing a family   crisis, are victims of abuse or neglect, or are homeless ? ?[facility] ?[address], [place], [place] [address] ?(336) 332-0824 ?ircgso.org ?**Drop-in center for people experiencing homelessness; overnight warming center when temperature is 25 degrees or below ? ?[employer] ?[address], [place], [place] [address] ?(336) 588-6983 ?https://reentrystaffingagency.org/ ?**help with affordable housing to people experiencing homelessness or unemployment due to incarceration ? ?[facility] ?[address], [place], [place] [address] ?(336) 271-5988 ?www.greensborourbanministry.org  ?**emergency and transitional housing, rent/mortgage assistance, utility assistance ? ?[facility] ?[address], [place], [place] [address] ?(w36) 273-5572 ?www.salvationarmyof[place].org ?**emergency and transitional housing ? ?[facility] [facility] [place] ?[address], [place], [place] [address] ?(336) 275-4663 ?Www.habitatgreens[place].org  ? ?[facility] [employer] [facility] ?[address], [place], [place] [address] ?(336) 676-6986 ?https://chshousing.org ?**Home Ownership/Affordable Housing Program and Home Repair Program ? ?[employer] [facility] ?[address], [place], [place] [address] ?(336) 553-0946 ?www.housingconsultantsgroup.org ?**home buyer education courses, foreclosure prevention ? ?[place] DHHS-Environmental Health ?[address], [place], [place] [address] ?(336)  [phone] ?http://eh.[place]ountync.gov ?**Environmental Exposure Assessment (investigation of homes where either children or pregnant women with a confirmed elevated blood lead level reside) ? ?[facility] ?[address], [place], [place] [address] ?(336) 487-0550 ?www.ncdhhs.gov/divisions/dvrs ?**Home Expense Assistance/Repairs Program; offers home accessibility updates, such as ramps or bars in the bathroom ? ?[facility] ?[address], [place], [place] [address] ?(336) [phone] ?https://www.self-help.org/locations/-branch ?**Offers credit-building and banking services to people unable to use traditional banking ? ? ? ? ? ? ?

## 2023-03-27 NOTE — BH Specialist Note (Signed)
Integrated Behavioral Health via Telemedicine Visit  04/08/2023 [name] [id number]  Number of Integrated Behavioral Health Clinician visits: 4- Fourth Visit  Session Start time: 1533   Session End time: 1548  Total time in minutes: 15   Referring Provider: Lorriane Shire, MD Patient/Family location: [place], [place] Angela Branch Provider location: [facility] [place] Healthcare at [facility] for Women  All persons participating in visit: [name] and Angela Branch Angela Branch   Types of Service: Individual psychotherapy and Telephone visit  I connected with Angela Branch's  n/a  via  Telephone or Engineer, civil (consulting)  (Video is Surveyor, mining) and verified that I am speaking with the correct person using two identifiers. Discussed confidentiality: Yes   I discussed the limitations of telemedicine and the availability of in person appointments.  Discussed there is a possibility of technology failure and discussed alternative modes of communication if that failure occurs.  I discussed that engaging in this telemedicine visit, they consent to the provision of behavioral healthcare and the services will be billed under their insurance.  Patient and/or legal guardian expressed understanding and consented to Telemedicine visit: Yes   Presenting Concerns: Patient and/or family reports the following symptoms/concerns: Being "triggered by mom's behaviors" and stress of continued job search. Pt did register for summer classes, spending time outdoors with kids daily, actively job-seeking; has had difficulty conversation with mom about time frame to move out.  Duration of problem: Ongoing; Severity of problem: moderate  Patient and/or Family's Strengths/Protective Factors: Sense of purpose and Physical Health (exercise, healthy diet, medication compliance, etc.)  Goals Addressed: Patient will:  Reduce symptoms of:  anxiety, depression, and stress   Increase knowledge and/or ability of: stress reduction   Demonstrate ability to: Increase motivation to adhere to plan of care  Progress towards Goals: Ongoing  Interventions: Interventions utilized:  Motivational Interviewing and Supportive Reflection Standardized Assessments completed: Not Needed  Patient and/or Family Response: Patient agrees with treatment plan.   Assessment: Patient currently experiencing Mood disorder, unspecified; Psychosocial stress.   Patient may benefit from continued therapeutic interventions.  Plan: Follow up with behavioral health clinician on : Three weeks Behavioral recommendations:  -Continue active job-seeking, followed by outdoor time with children daily -Continue maintaining firm boundaries with mom regarding time frame to move out into independent housing Referral(s): Integrated [employer] (In Clinic)  I discussed the assessment and treatment plan with the patient and/or parent/guardian. They were provided an opportunity to ask questions and all were answered. They agreed with the plan and demonstrated an understanding of the instructions.   They were advised to call back or seek an in-person evaluation if the symptoms worsen or if the condition fails to improve as anticipated.  Valetta Close Pacer Dorn, LCSW

## 2023-04-08 ENCOUNTER — Ambulatory Visit (INDEPENDENT_AMBULATORY_CARE_PROVIDER_SITE_OTHER): Payer: Medicaid Other | Admitting: Clinical

## 2023-04-08 DIAGNOSIS — F39 Unspecified mood [affective] disorder: Secondary | ICD-10-CM | POA: Diagnosis not present

## 2023-04-08 DIAGNOSIS — Z658 Other specified problems related to psychosocial circumstances: Secondary | ICD-10-CM

## 2023-04-08 NOTE — Patient Instructions (Signed)
Center for [facility] Healthcare at [place] [facility] [address] [place],  [address] [phone] (main office) [phone] (Alden Bensinger's office)   

## 2023-04-15 NOTE — BH Specialist Note (Signed)
Integrated Behavioral Health via Telemedicine Visit  04/29/2023 Angela Branch [id number]  Number of Integrated Behavioral Health Clinician visits: 4- Fourth Visit  Session Start time: 1533   Session End time: 1548  Total time in minutes: 15   Referring Provider: Lorriane Shire, [facility] Patient/Family location: [place] Provider location: Center for [facility] Healthcare at [facility] for [facility]  All persons participating in visit: [name] and [facility] Angela Branch   Types of Service: Individual psychotherapy and Telephone visit  I connected with Angela Branch and/or Angela Branch's  n/a  via  Telephone or Engineer, civil (consulting)  (Video is Surveyor, mining) and verified that I am speaking with the correct person using two identifiers. Discussed confidentiality: Yes   I discussed the limitations of telemedicine and the availability of in person appointments.  Discussed there is a possibility of technology failure and discussed alternative modes of communication if that failure occurs.  I discussed that engaging in this telemedicine visit, they consent to the provision of behavioral healthcare and the services will be billed under their insurance.  Patient and/or legal guardian expressed understanding and consented to Telemedicine visit: Yes   Presenting Concerns: Patient and/or family reports the following symptoms/concerns: Processing recent boundaries set with mother regarding expectations with housing, as well as processing conflict with child's father who continues to disrespect boundaries, and feeling increased depression, attributed to lack of family support/celebration for graduation with associate's degree this week, as well as starting master's-level program this summer.  Duration of problem: Ongoing; Severity of problem: moderate  Patient and/or Family's Strengths/Protective Factors: Social connections, Sense of purpose,  and Physical Health (exercise, healthy diet, medication compliance, etc.)  Goals Addressed: Patient will:  Reduce symptoms of: anxiety, depression, and stress   Increase knowledge and/or ability of: stress reduction   Demonstrate ability to: Increase healthy adjustment to current life circumstances  Progress towards Goals: Ongoing  Interventions: Interventions utilized:  Manufacturing systems engineer and Supportive Reflection Standardized Assessments completed: Not Needed  Patient and/or Family Response: Patient agrees with treatment plan.   Assessment: Patient currently experiencing Mood disorder, unspecified; Psychosocial stress.   Patient may benefit from continued therapeutic interventions .  Plan: Follow up with behavioral health clinician on : Two weeks Behavioral recommendations:  -Continue maintaining firm boundaries with mom and child's father; allow time and space prior to graduation on Thursday for de-stress time; allow time to celebrate your own success, no matter who does or does not show up to celebrate with you.  -Continue plan to start classes in June; continue job search -Consider allowing one full day prior to beginning back to classes, for relaxation/no obligations Referral(s): Integrated [employer] (In Clinic)  I discussed the assessment and treatment plan with the patient and/or parent/guardian. They were provided an opportunity to ask questions and all were answered. They agreed with the plan and demonstrated an understanding of the instructions.   They were advised to call back or seek an in-person evaluation if the symptoms worsen or if the condition fails to improve as anticipated.  Valetta Close Angela Messinger, LCSW     02/27/2023    1:20 PM 11/27/2021    5:04 PM 09/20/2021    4:11 PM 04/02/2021    3:40 PM 09/18/2020    3:21 PM  Depression screen PHQ 2/9  Decreased Interest 0 0 0 0 0  Down, Depressed, Hopeless 0 0 0 0 0  PHQ - 2 Score 0 0 0 0 0  Altered  sleeping 0 0 0 0 0  Tired, decreased energy 0 0 0 0 0  Change in appetite 0 0 0 0 0  Feeling bad or failure about yourself  0 0 0 0 0  Trouble concentrating 0 0 0 0 0  Moving slowly or fidgety/restless 0 0 0 0 0  Suicidal thoughts 0 0 0 0 0  PHQ-9 Score 0 0 0 0 0  Difficult doing work/chores    Not difficult at all       02/27/2023    1:20 PM 11/27/2021    5:05 PM 09/20/2021    4:11 PM 04/02/2021    3:40 PM  GAD 7 : Generalized Anxiety Score  Nervous, Anxious, on Edge 0 0 0 0  Control/stop worrying 0 0 0 0  Worry too much - different things 0 0 0 0  Trouble relaxing 0 0 0 0  Restless 0 0 0 0  Easily annoyed or irritable 0 0 0 0  Afraid - awful might happen 0 0 0 0  Total GAD 7 Score 0 0 0 0

## 2023-04-29 ENCOUNTER — Ambulatory Visit (INDEPENDENT_AMBULATORY_CARE_PROVIDER_SITE_OTHER): Payer: Medicaid Other | Admitting: Clinical

## 2023-04-29 DIAGNOSIS — F39 Unspecified mood [affective] disorder: Secondary | ICD-10-CM

## 2023-04-29 DIAGNOSIS — Z658 Other specified problems related to psychosocial circumstances: Secondary | ICD-10-CM

## 2023-04-30 NOTE — BH Specialist Note (Signed)
Integrated Behavioral Health via Telemedicine Visit  05/13/2023 Angela Branch [id number]  Number of Integrated Behavioral Health Clinician visits: 6-Sixth Visit  Session Start time: 1519   Session End time: 1548  Total time in minutes: 29   Referring Provider: Lorriane Shire, MD Patient/Family location: Home [facility] Provider location: Center for [facility]  All persons participating in visit: [name] and Angela Branch   Types of Service: Individual psychotherapy and Video visit  I connected with Angela Branch and/or Angela Branch's  n/a  via  Telephone or Engineer, civil (consulting)  (Video is Surveyor, mining) and verified that I am speaking with the correct person using two identifiers. Discussed confidentiality: Yes   I discussed the limitations of telemedicine and the availability of in person appointments.  Discussed there is a possibility of technology failure and discussed alternative modes of communication if that failure occurs.  I discussed that engaging in this telemedicine visit, they consent to the provision of behavioral healthcare and the services will be billed under their insurance.  Patient and/or legal guardian expressed understanding and consented to Telemedicine visit: Yes   Presenting Concerns: Patient and/or family reports the following symptoms/concerns: Anger and frustration with current life stress; maintaining a positive outlook with future-orientated approach to cope, with goal of gaining full independence from negative surroundings.  Duration of problem: Ongoing; Severity of problem: moderate  Patient and/or Family's Strengths/Protective Factors: Social connections, Sense of purpose, and Physical Health (exercise, healthy diet, medication compliance, etc.)  Goals Addressed: Patient will:  Reduce symptoms of: anxiety, depression, mood instability, and stress    Increase knowledge and/or ability of: stress reduction   Demonstrate ability to: Increase healthy adjustment to current life circumstances and Increase motivation to adhere to plan of care  Progress towards Goals: Ongoing  Interventions: Interventions utilized:  Solution-Focused Strategies and Supportive Reflection Standardized Assessments completed: Not Needed  Patient and/or Family Response: Patient agrees with treatment plan.   Assessment: Patient currently experiencing Mood disorder; Psychosocial stress.   Patient may benefit from continued therapeutic interventions.  Plan: Follow up with behavioral health clinician on : Five weeks Behavioral recommendations:  -Continue daily self-coping strategies (healthy boundaries, problem-solving, positive and future-oriented outlook; time outdoors with children on good-weather days) -Continue plan to begin June classes, while continuing job search Referral(s): Integrated [employer] (In Clinic)  I discussed the assessment and treatment plan with the patient and/or parent/guardian. They were provided an opportunity to ask questions and all were answered. They agreed with the plan and demonstrated an understanding of the instructions.   They were advised to call back or seek an in-person evaluation if the symptoms worsen or if the condition fails to improve as anticipated.  Valetta Close Nicolette Gieske, LCSW     02/27/2023    1:20 PM 11/27/2021    5:04 PM 09/20/2021    4:11 PM 04/02/2021    3:40 PM 09/18/2020    3:21 PM  Depression screen PHQ 2/9  Decreased Interest 0 0 0 0 0  Down, Depressed, Hopeless 0 0 0 0 0  PHQ - 2 Score 0 0 0 0 0  Altered sleeping 0 0 0 0 0  Tired, decreased energy 0 0 0 0 0  Change in appetite 0 0 0 0 0  Feeling bad or failure about yourself  0 0 0 0 0  Trouble concentrating 0 0 0 0 0  Moving slowly or fidgety/restless 0 0 0  0 0  Suicidal thoughts 0 0 0 0 0  PHQ-9 Score 0 0 0 0 0  Difficult doing  work/chores    Not difficult at all       02/27/2023    1:20 PM 11/27/2021    5:05 PM 09/20/2021    4:11 PM 04/02/2021    3:40 PM  GAD 7 : Generalized Anxiety Score  Nervous, Anxious, on Edge 0 0 0 0  Control/stop worrying 0 0 0 0  Worry too much - different things 0 0 0 0  Trouble relaxing 0 0 0 0  Restless 0 0 0 0  Easily annoyed or irritable 0 0 0 0  Afraid - awful might happen 0 0 0 0  Total GAD 7 Score 0 0 0 0

## 2023-05-13 ENCOUNTER — Ambulatory Visit (INDEPENDENT_AMBULATORY_CARE_PROVIDER_SITE_OTHER): Payer: Medicaid Other | Admitting: Clinical

## 2023-05-13 DIAGNOSIS — F39 Unspecified mood [affective] disorder: Secondary | ICD-10-CM

## 2023-05-13 DIAGNOSIS — Z658 Other specified problems related to psychosocial circumstances: Secondary | ICD-10-CM

## 2023-05-28 ENCOUNTER — Other Ambulatory Visit: Payer: Self-pay

## 2023-05-28 ENCOUNTER — Ambulatory Visit: Payer: Medicaid Other

## 2023-05-28 ENCOUNTER — Encounter: Payer: Self-pay | Admitting: *Deleted

## 2023-05-29 ENCOUNTER — Ambulatory Visit (INDEPENDENT_AMBULATORY_CARE_PROVIDER_SITE_OTHER): Payer: Medicaid Other | Admitting: General Practice

## 2023-05-29 ENCOUNTER — Other Ambulatory Visit (HOSPITAL_COMMUNITY)
Admission: RE | Admit: 2023-05-29 | Discharge: 2023-05-29 | Disposition: A | Discharge status: Home / Self Care | Payer: Medicaid Other | Source: Ambulatory Visit | Attending: Family Medicine | Admitting: Family Medicine

## 2023-05-29 ENCOUNTER — Ambulatory Visit: Payer: Medicaid Other

## 2023-05-29 VITALS — BP 122/81 | HR 74 | Ht 61.0 in | Wt 127.0 lb

## 2023-05-29 DIAGNOSIS — Z113 Encounter for screening for infections with a predominantly sexual mode of transmission: Secondary | ICD-10-CM | POA: Insufficient documentation

## 2023-05-29 DIAGNOSIS — N898 Other specified noninflammatory disorders of vagina: Secondary | ICD-10-CM | POA: Diagnosis not present

## 2023-05-29 NOTE — Progress Notes (Signed)
Patient presents to office today reporting increased vaginal discharge with odor and cramping. She reports history of BV which is what this feels like to her. She is also requesting STD screening. Patient instructed in self swab & specimen collected. Advised results will be back and available via mychart in 24-48 hours. Patient requests metrogel rather than flagyl if results show BV.   Chase Caller RN BSN 05/29/23

## 2023-05-30 ENCOUNTER — Other Ambulatory Visit: Payer: Self-pay | Admitting: Obstetrics and Gynecology

## 2023-05-30 DIAGNOSIS — B9689 Other specified bacterial agents as the cause of diseases classified elsewhere: Secondary | ICD-10-CM

## 2023-05-30 LAB — CERVICOVAGINAL ANCILLARY ONLY
Bacterial Vaginitis (gardnerella): POSITIVE — AB
Candida Glabrata: NEGATIVE
Candida Vaginitis: NEGATIVE
Chlamydia: NEGATIVE
Comment: NEGATIVE
Comment: NEGATIVE
Comment: NEGATIVE
Comment: NEGATIVE
Comment: NEGATIVE
Comment: NORMAL
Neisseria Gonorrhea: NEGATIVE
Trichomonas: NEGATIVE

## 2023-05-30 MED ORDER — METRONIDAZOLE 0.75 % VA GEL: 1.0000 | Freq: Every day | VAGINAL | 1 refills | Status: DC

## 2023-06-03 ENCOUNTER — Other Ambulatory Visit: Payer: Self-pay

## 2023-06-03 ENCOUNTER — Encounter: Payer: Self-pay | Admitting: Obstetrics and Gynecology

## 2023-06-03 ENCOUNTER — Ambulatory Visit (INDEPENDENT_AMBULATORY_CARE_PROVIDER_SITE_OTHER): Payer: Medicaid Other | Admitting: Obstetrics and Gynecology

## 2023-06-03 VITALS — BP 114/69 | HR 62 | Wt 156.5 lb

## 2023-06-03 DIAGNOSIS — Z30430 Encounter for insertion of intrauterine contraceptive device: Secondary | ICD-10-CM | POA: Diagnosis not present

## 2023-06-03 DIAGNOSIS — B379 Candidiasis, unspecified: Secondary | ICD-10-CM

## 2023-06-03 LAB — POCT PREGNANCY, URINE: Preg Test, Ur: NEGATIVE

## 2023-06-03 MED ORDER — PARAGARD INTRAUTERINE COPPER IU IUD
1.0000 | INTRAUTERINE_SYSTEM | Freq: Once | INTRAUTERINE | Status: AC
  Administered 2023-06-03: 1 via INTRAUTERINE

## 2023-06-03 MED ORDER — FLUCONAZOLE 150 MG PO TABS: 150.0000 mg | ORAL_TABLET | Freq: Once | ORAL | 3 refills | Status: AC

## 2023-06-03 NOTE — Progress Notes (Signed)
IUD Insertion Procedure Note Patient identified, informed consent performed, consent signed.   Discussed risks of irregular bleeding, cramping, infection, malpositioning or misplacement of the IUD outside the uterus which may require further procedure such as laparoscopy. Also discussed >99% contraception efficacy, increased risk of ectopic pregnancy with failure of method.  Time out was performed.  Urine pregnancy test negative.  Speculum placed in the vagina.  Cervix visualized.  Cleaned with Betadine x 2.  Anterior cervix grasped with a single tooth tenaculum.  Paracervical block was not administered.  Paragard IUD placed per manufacturer's recommendations.  Strings wrapped around device and not able to be trimmed. Tenaculum was removed, good hemostasis noted.  Patient tolerated procedure well.   Patient was given post-procedure instructions.  She was advised to have backup contraception for one week.  Patient was also asked to check IUD strings periodically and follow up prn for IUD check.   Lorriane Shire, MD, FACOG Minimally Invasive Gynecologic Surgery  Obstetrics and Gynecology, [facility] for [facility], [facility] 06/03/2023

## 2023-06-03 NOTE — Patient Instructions (Signed)
Copper Intrauterine Device (IUD) What is this medication? COPPER IUD (KOP er EYE YOU DEE) prevents pregnancy. It works by using copper to prevent sperm from reaching the egg (fertilization) without hormones. It is a reversible, long-term contraceptive. This medicine may be used for other purposes; ask your health care provider or pharmacist if you have questions. COMMON BRAND NAME(S): ParaGard T380A What should I tell my care team before I take this medication? They need to know if you have any of these conditions: Abnormal Pap smear Cancer, including leukemia, uterine cancer, cervical cancer Condition of the uterus that changes its shape, such as large fibroid tumors Endometriosis Genital or pelvic infection now or in the past 3 months Have more than one sexual partner or your partner has more than one partner History of an ectopic or tubal pregnancy Immune system problems Intrauterine system already in your uterus Pelvic inflammatory disease (PID) Sexually transmitted infection (STI) Substance abuse disorder Unexplained vaginal bleeding An unusual or allergic reaction to copper, barium sulfate, polyethylene, other medications, foods, dyes, or preservatives Pregnant or trying to get pregnant Breast-feeding How should I use this medication? This device is placed inside the uterus by your care team. A patient package insert for the product will be given each time it is inserted. Be sure to read this information carefully each time. The sheet may change often. Talk to your care team about use of this medication in children. Special care may be needed. Overdosage: If you think you have taken too much of this medicine contact a poison control center or emergency room at once. NOTE: This medicine is only for you. Do not share this medicine with others. What if I miss a dose? This does not apply. The device will need to be replaced every 10 years if you wish to continue using this type of  contraception. What may interact with this medication? Interactions are not expected. This list may not describe all possible interactions. Give your health care provider a list of all the medicines, herbs, non-prescription drugs, or dietary supplements you use. Also tell them if you smoke, drink alcohol, or use illegal drugs. Some items may interact with your medicine. What should I watch for while using this medication? Visit your care team for regular check-ups. Talk to your care team if you wish to become pregnant or think you might be pregnant. The IUD will need to be removed. The IUD does not protect you or your partner against HIV or any other sexually transmitted infections. Tell your care team if you or your partner becomes HIV positive or gets a sexually transmitted infection. You can check the placement of the IUD yourself by reaching up to the top of your vagina with clean fingers to feel the threads. Do not pull on the threads. It is a good habit to check placement after each menstrual period. Call your care team right away if you feel more of the IUD than just the threads or if you cannot feel the threads at all. The IUD may come out by itself. You may become pregnant if the device comes out. If you notice that the IUD has come out use a backup contraceptive method, such as condoms, and call your care team. Using tampons will not change the position of the IUD. It is okay to use tampons during your menstrual period. This IUD can be safely scanned with magnetic resonance imaging (MRI) only under specific conditions. Before you have an MRI, tell your care team that  you have an IUD in place, and which type of IUD you have in place. What side effects may I notice from receiving this medication? Side effects that you should report to your care team as soon as possible: Allergic reactions--skin rash, itching, hives, swelling of the face, lips, tongue, or throat Heavy vaginal bleeding Low red  blood cell level--unusual weakness or fatigue, dizziness, headache, trouble breathing Pelvic inflammatory disease (PID)--fever, abdominal pain, pelvic pain, pain or trouble passing urine, spotting, bleeding during or after sex Seizures Slow heartbeat--dizziness, feeling faint or lightheaded, confusion, trouble breathing, unusual weakness or fatigue Stomach pain, unusual weakness or fatigue, nausea, vomiting, diarrhea, or fever that lasts longer than expected Unusual vaginal discharge, itching, or odor Vaginal pain, irritation, or sores Side effects that usually do not require medical attention (report these to your care team if they continue or are bothersome): Back pain Irregular menstrual cycles or spotting Menstrual cramps Vaginal discharge This list may not describe all possible side effects. Call your doctor for medical advice about side effects. You may report side effects to FDA at 1-800-FDA-1088. Where should I keep my medication? This does not apply. NOTE: This sheet is a summary. It may not cover all possible information. If you have questions about this medicine, talk to your doctor, pharmacist, or health care provider.  2024 Elsevier/Gold Standard (2022-03-01 00:00:00)

## 2023-06-05 LAB — HEPATITIS C ANTIBODY: Hep C Virus Ab: NONREACTIVE

## 2023-06-05 LAB — HEPATITIS B SURFACE ANTIGEN: Hepatitis B Surface Ag: NEGATIVE

## 2023-06-05 LAB — HIV ANTIBODY (ROUTINE TESTING W REFLEX): HIV Screen 4th Generation wRfx: NONREACTIVE

## 2023-06-05 LAB — RPR: RPR Ser Ql: NONREACTIVE

## 2023-06-17 NOTE — BH Specialist Note (Signed)
Pt did not arrive to video visit and did not answer the phone; Left HIPPA-compliant message to call back Alpheus Stiff from [facility] at  [phone] (Renell Allum's office).  ?; left MyChart message for patient.  ? ?

## 2023-06-23 ENCOUNTER — Ambulatory Visit: Payer: Medicaid Other | Admitting: Clinical

## 2023-06-23 DIAGNOSIS — Z91199 Patient's noncompliance with other medical treatment and regimen due to unspecified reason: Secondary | ICD-10-CM

## 2023-06-25 NOTE — BH Specialist Note (Signed)
Pt did not arrive to video visit and did not answer the phone; Baxter Hire; left MyChart message for patient.

## 2023-07-03 ENCOUNTER — Other Ambulatory Visit (HOSPITAL_COMMUNITY)
Admission: RE | Admit: 2023-07-03 | Discharge: 2023-07-03 | Disposition: A | Discharge status: Home / Self Care | Payer: Medicaid Other | Source: Ambulatory Visit | Attending: Obstetrics and Gynecology | Admitting: Obstetrics and Gynecology

## 2023-07-03 ENCOUNTER — Ambulatory Visit (INDEPENDENT_AMBULATORY_CARE_PROVIDER_SITE_OTHER): Payer: Medicaid Other | Admitting: Obstetrics and Gynecology

## 2023-07-03 ENCOUNTER — Encounter: Payer: Self-pay | Admitting: Obstetrics and Gynecology

## 2023-07-03 ENCOUNTER — Other Ambulatory Visit: Payer: Self-pay

## 2023-07-03 VITALS — BP 96/60 | HR 76 | Wt 156.5 lb

## 2023-07-03 DIAGNOSIS — Z975 Presence of (intrauterine) contraceptive device: Secondary | ICD-10-CM | POA: Insufficient documentation

## 2023-07-03 DIAGNOSIS — R102 Pelvic and perineal pain: Secondary | ICD-10-CM | POA: Insufficient documentation

## 2023-07-03 HISTORY — DX: Presence of (intrauterine) contraceptive device: Z97.5

## 2023-07-03 NOTE — Progress Notes (Signed)
GYNECOLOGY VISIT  Patient name: Angela Branch MRN [id number]  Date of birth: 01-02-1996 Chief Complaint:   iud follow up  History:  Angela Branch is a 27 y.o. [id number] being seen today for IUD string follow up. Has had a menses that was heavier and longer than her usual but otherwise no issues. She would like STI and vaginitis testing as well. Initially had discomfort after insertion and not sure if it was due to IUD or BV, for which she often just has mild discomfort w/ BV infection.    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  Flu Vaccine   Genetic Screen  NIPS: low risk female      TDaP vaccine    Hgb A1C or  GTT  Third trimester  Rhogam  n/a   LAB RESULTS  Feeding Plan Breast/Bottle Blood Type A/Positive/-- (07/16 1206)  Contraception undecided Antibody Negative (07/16   Thyroid disease     Past Surgical History:  Procedure Laterality Date   TOOTH EXTRACTION     WRIST SURGERY  08/30/2020    The following portions of the patient's history  were reviewed and updated as appropriate: allergies, current medications, past family history, past medical history, past social history, past surgical history and problem list.   Health Maintenance:   Last pap     Component Value Date/Time   DIAGPAP  07/25/2021 1504    - Negative for intraepithelial lesion or malignancy (NILM)   DIAGPAP  11/03/2018 0000    NEGATIVE FOR INTRAEPITHELIAL LESIONS OR MALIGNANCY.   ADEQPAP  07/25/2021 1504    Satisfactory for evaluation; transformation zone component PRESENT.   ADEQPAP  11/03/2018 0000    Satisfactory for evaluation  endocervical/transformation zone component PRESENT.    High Risk HPV: Positive  Adequacy:  Satisfactory for evaluation, transformation zone component PRESENT  Diagnosis:  Atypical squamous cells of undetermined significance (ASC-US)  Last mammogram: n/a   Review of Systems:  Pertinent items are noted in HPI. Comprehensive review of systems was otherwise negative.   Objective:  Physical Exam BP 96/60   Pulse 76   Wt 156 lb 8 oz (71 kg)   LMP 06/05/2023 (Approximate) Comment: unsure of date  BMI 29.57 kg/m    Physical Exam Vitals and nursing note reviewed. Exam conducted with a chaperone present.  Constitutional:      Appearance: Normal appearance.  HENT:     Head: Normocephalic and atraumatic.  Pulmonary:  Effort: Pulmonary effort is normal.     Breath sounds: Normal breath sounds.  Abdominal:     Comments: BSUS: IUD visualized within endometrial cavity   Genitourinary:    General: Normal vulva.     Exam position: Lithotomy position.     Vagina: Normal.     Cervix: Normal.     Comments: IUD strings not visualized on exam  Skin:    General: Skin is warm and dry.  Neurological:     General: No focal deficit present.     Mental Status: She is alert.  Psychiatric:        Mood and Affect: Mood normal.        Behavior: Behavior normal.        Thought Content: Thought content normal.        Judgment:  Judgment normal.        Assessment & Plan:   1. Pelvic pain Vaginitis swab collected - Cervicovaginal ancillary only  2. IUD (intrauterine device) in place Strings not visualized but able to see IUD within endometrial cavity on bedside ultrasound. Follow up as needed.    Routine preventative health maintenance measures emphasized.  Lorriane Shire, MD Minimally Invasive Gynecologic Surgery Center for [facility], [facility]

## 2023-07-04 ENCOUNTER — Ambulatory Visit: Payer: Medicaid Other | Admitting: Clinical

## 2023-07-04 DIAGNOSIS — Z91199 Patient's noncompliance with other medical treatment and regimen due to unspecified reason: Secondary | ICD-10-CM

## 2023-07-07 LAB — CERVICOVAGINAL ANCILLARY ONLY
Bacterial Vaginitis (gardnerella): NEGATIVE
Candida Glabrata: NEGATIVE
Candida Vaginitis: NEGATIVE
Chlamydia: NEGATIVE
Comment: NEGATIVE
Comment: NEGATIVE
Comment: NEGATIVE
Comment: NEGATIVE
Comment: NEGATIVE
Comment: NORMAL
Neisseria Gonorrhea: NEGATIVE
Trichomonas: NEGATIVE

## 2023-07-28 ENCOUNTER — Other Ambulatory Visit: Payer: Self-pay | Admitting: Pediatrics

## 2023-07-28 MED ORDER — VENTOLIN HFA 108 (90 BASE) MCG/ACT IN AERS: 2.0000 | INHALATION_SPRAY | Freq: Four times a day (QID) | RESPIRATORY_TRACT | 11 refills | Status: DC | PRN

## 2023-07-28 NOTE — BH Specialist Note (Unsigned)
Integrated Behavioral Health via Telemedicine Visit  07/28/2023 [name] [id number]  Number of Integrated Behavioral Health Clinician visits: 6-Sixth Visit  Session Start time: 1519   Session End time: 1548  Total time in minutes: 29   Referring Provider: *** Patient/Family location: Home*** [facility] Provider location: [facility]  All persons participating in visit: [name] and [facility] [name] ***  Types of Service: {CHL AMB TYPE OF SERVICE:[phone]}  I connected with Angela Branch and/or Angela Branch's {family members:20773} via  Telephone or Engineer, civil (consulting)  (Video is Surveyor, mining) and verified that I am speaking with the correct person using two identifiers. Discussed confidentiality: Yes   I discussed the limitations of telemedicine and the availability of in person appointments.  Discussed there is a possibility of technology failure and discussed alternative modes of communication if that failure occurs.  I discussed that engaging in this telemedicine visit, they consent to the provision of behavioral healthcare and the services will be billed under their insurance.  Patient and/or legal guardian expressed understanding and consented to Telemedicine visit: Yes   Presenting Concerns: Patient and/or family reports the following symptoms/concerns: *** Duration of problem: ***; Severity of problem: {Mild/Moderate/Severe:20260}  Patient and/or Family's Strengths/Protective Factors: {CHL AMB BH PROTECTIVE [phone]}  Goals Addressed: Patient will:  Reduce symptoms of: {IBH Symptoms:21014056}   Increase knowledge and/or ability of: {IBH Patient Tools:21014057}   Demonstrate ability to: {IBH Goals:21014053}  Progress towards Goals: {CHL AMB BH PROGRESS TOWARDS [phone]}  Interventions: Interventions utilized:  {IBH  Interventions:21014054} Standardized Assessments completed: {IBH Screening Tools:21014051}  Patient and/or Family Response: Patient agrees with treatment plan. ***  Assessment: Patient currently experiencing ***.   Patient may benefit from continued therapeutic intervention *** .  Plan: Follow up with behavioral health clinician on : *** Behavioral recommendations:  -*** -*** Referral(s): {IBH Referrals:21014055}  I discussed the assessment and treatment plan with the patient and/or parent/guardian. They were provided an opportunity to ask questions and all were answered. They agreed with the plan and demonstrated an understanding of the instructions.   They were advised to call back or seek an in-person evaluation if the symptoms worsen or if the condition fails to improve as anticipated.  Valetta Close [name], LCSW     02/27/2023    1:20 PM 11/27/2021    5:04 PM 09/20/2021    4:11 PM 04/02/2021    3:40 PM 09/18/2020    3:21 PM  Depression screen PHQ 2/9  Decreased Interest 0 0 0 0 0  Down, Depressed, Hopeless 0 0 0 0 0  PHQ - 2 Score 0 0 0 0 0  Altered sleeping 0 0 0 0 0  Tired, decreased energy 0 0 0 0 0  Change in appetite 0 0 0 0 0  Feeling bad or failure about yourself  0 0 0 0 0  Trouble concentrating 0 0 0 0 0  Moving slowly or fidgety/restless 0 0 0 0 0  Suicidal thoughts 0 0 0 0 0  PHQ-9 Score 0 0 0 0 0  Difficult doing work/chores    Not difficult at all       02/27/2023    1:20 PM 11/27/2021    5:05 PM 09/20/2021    4:11 PM 04/02/2021    3:40 PM  GAD 7 : Generalized Anxiety Score  Nervous, Anxious, on Edge 0 0 0 0  Control/stop worrying 0 0 0 0  Worry too much - different things 0 0 0 0  Trouble relaxing 0 0 0 0  Restless 0 0 0 0  Easily annoyed or irritable 0 0 0 0  Afraid - awful might happen 0 0 0 0  Total GAD 7 Score 0 0 0 0

## 2023-07-30 ENCOUNTER — Encounter: Payer: Self-pay | Admitting: Obstetrics and Gynecology

## 2023-07-30 ENCOUNTER — Other Ambulatory Visit: Payer: Self-pay

## 2023-07-30 ENCOUNTER — Ambulatory Visit (INDEPENDENT_AMBULATORY_CARE_PROVIDER_SITE_OTHER): Payer: Medicaid Other | Admitting: Obstetrics and Gynecology

## 2023-07-30 VITALS — BP 108/69 | HR 75 | Wt 154.2 lb

## 2023-07-30 DIAGNOSIS — Z3009 Encounter for other general counseling and advice on contraception: Secondary | ICD-10-CM

## 2023-07-30 DIAGNOSIS — Z30432 Encounter for removal of intrauterine contraceptive device: Secondary | ICD-10-CM | POA: Diagnosis not present

## 2023-07-30 MED ORDER — TWIRLA 120-30 MCG/24HR TD PTWK: 1.0000 | MEDICATED_PATCH | TRANSDERMAL | 2 refills | Status: DC

## 2023-07-30 NOTE — Progress Notes (Signed)
GYNECOLOGY VISIT  Patient name: Angela Branch MRN [id number]  Date of birth: [birth year] Chief Complaint:   Procedure  History:  HELIN AMMONS is a 27 y.o. [id number] being seen today for pain with IUD in place.  Having pain with it in place. Having pain with intercourse which is new.   2018 had the patch and had reaction. Has intermittent reactions to adhesive but would rather try the patch again and have the IUD removed.   May want to have another child in the future   Has been having more headaches.   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  Flu Vaccine   Genetic Screen  NIPS: low risk female      TDaP vaccine    Hgb A1C or  GTT  Third trimester  Rhogam  n/a   LAB RESULTS  Feeding Plan Breast/Bottle Blood Type A/Positive/-- (07/16 1206)  Contraception undecided Antibody Negative (07/16   Thyroid disease     Past Surgical History:  Procedure Laterality Date   TOOTH EXTRACTION     WRIST SURGERY  08/30/2020    The following portions of the  patient's history were reviewed and updated as appropriate: allergies, current medications, past family history, past medical history, past social history, past surgical history and problem list.   Health Maintenance:   Last pap     Component Value Date/Time   DIAGPAP  07/25/2021 1504    - Negative for intraepithelial lesion or malignancy (NILM)   DIAGPAP  11/03/2018 0000    NEGATIVE FOR INTRAEPITHELIAL LESIONS OR MALIGNANCY.   ADEQPAP  07/25/2021 1504    Satisfactory for evaluation; transformation zone component PRESENT.   ADEQPAP  11/03/2018 0000    Satisfactory for evaluation  endocervical/transformation zone component PRESENT.    High Risk HPV: Positive  Adequacy:  Satisfactory for evaluation, transformation zone component PRESENT  Diagnosis:  Atypical squamous cells of undetermined significance (ASC-US)  Last mammogram:n/a   Review of Systems:  Pertinent items are noted in HPI. Comprehensive review of systems was otherwise negative.   Objective:  Physical Exam BP 108/69   Pulse 75   Wt 154 lb 3.2 oz (69.9 kg)   LMP 07/09/2023 (Approximate) Comment: unsure of date  BMI 29.14 kg/m    Physical Exam Vitals and nursing note reviewed. Exam conducted with a chaperone present.  Constitutional:      Appearance: Normal appearance.  HENT:     Head: Normocephalic and atraumatic.  Pulmonary:  Effort: Pulmonary effort is normal.     Breath sounds: Normal breath sounds.  Genitourinary:    General: Normal vulva.     Exam position: Lithotomy position.     Vagina: Normal.     Cervix: Normal.  Skin:    General: Skin is warm and dry.  Neurological:     General: No focal deficit present.     Mental Status: She is alert.  Psychiatric:        Mood and Affect: Mood normal.        Behavior: Behavior normal.        Thought Content: Thought content normal.        Judgment: Judgment normal.     IUD Removal  Patient identified, informed consent performed, consent signed.   Patient was in the dorsal lithotomy position, normal external genitalia was noted.  A speculum was placed in the patient's vagina, normal discharge was noted, no lesions. The cervix was visualized, no lesions, no abnormal discharge.  The strings of the IUD were not visualized, so Kelly forceps were introduced into the endometrial cavity and the IUD couldn't be removed. A bedside ultrasound was used to confirm the IUD was in the uterus. After confirmation, an IUD hook was used to bring the IUD strings out of the cervix and the IUD was grasped and removed in its entirety. Patient tolerated the procedure.        Assessment & Plan:   1. Encounter for IUD removal Patient will use the patch for contraception. Routine preventative health maintenance measures emphasized.   2. Encounter for counseling regarding contraception Will trial the patch.  - Levonorgestrel-Eth Estradiol (TWIRLA) 120-30 MCG/24HR PTWK; Place 1 patch onto the skin once a week.  Dispense: 28 patch; Refill: 2     Routine preventative health maintenance measures emphasized.  Lorriane Shire, MD Minimally Invasive Gynecologic Surgery [facility], [facility]

## 2023-08-05 ENCOUNTER — Ambulatory Visit: Payer: Medicaid Other | Admitting: Clinical

## 2023-08-05 DIAGNOSIS — F39 Unspecified mood [affective] disorder: Secondary | ICD-10-CM

## 2023-08-05 DIAGNOSIS — Z658 Other specified problems related to psychosocial circumstances: Secondary | ICD-10-CM

## 2023-08-07 ENCOUNTER — Other Ambulatory Visit: Payer: Self-pay | Admitting: *Deleted

## 2023-08-07 ENCOUNTER — Encounter: Payer: Self-pay | Admitting: Obstetrics and Gynecology

## 2023-08-07 MED ORDER — METRONIDAZOLE 500 MG PO TABS: 500.0000 mg | ORAL_TABLET | Freq: Two times a day (BID) | ORAL | 0 refills | Status: DC

## 2023-08-07 MED ORDER — FLUCONAZOLE 150 MG PO TABS: 150.0000 mg | ORAL_TABLET | Freq: Once | ORAL | 0 refills | Status: AC

## 2023-08-07 NOTE — BH Specialist Note (Unsigned)
Integrated Behavioral Health via Telemedicine Visit  08/21/2023 TINLEIGH Branch [id number]  Number of Integrated Behavioral Health Clinician visits: 2- Second Visit  Session Start time: 1317   Session End time: 1343  Total time in minutes: 26   Referring Provider: Lorriane Shire, MD Patient/Family location: Home [facility] Provider location: Center for [facility] at [facility] for Women  All persons participating in visit: [name] and Angela Branch   Types of Service: Individual psychotherapy and Video visit  I connected with Angela Branch and/or Angela Branch's  n/a  via  Telephone or Engineer, civil (consulting)  (Video is Surveyor, mining) and verified that I am speaking with the correct person using two identifiers. Discussed confidentiality: Yes   I discussed the limitations of telemedicine and the availability of in person appointments.  Discussed there is a possibility of technology failure and discussed alternative modes of communication if that failure occurs.  I discussed that engaging in this telemedicine visit, they consent to the provision of behavioral healthcare and the services will be billed under their insurance.  Patient and/or legal guardian expressed understanding and consented to Telemedicine visit: Yes   Presenting Concerns: Patient and/or family reports the following symptoms/concerns: "tired and frustrated" with life stress; anticipates stress reduction when mother moves out soon; requests options for ongoing therapy and referral to psychiatry.  Duration of problem: Ongoing; Severity of problem: moderate  Patient and/or Family's Strengths/Protective Factors: Social connections, Social and Emotional competence, Concrete supports in place (healthy food, safe environments, etc.), Sense of purpose, and Physical Health (exercise, healthy diet, medication compliance, etc.)  Goals Addressed: Patient  will:  Reduce symptoms of: anxiety and stress   Increase knowledge and/or ability of: stress reduction   Demonstrate ability to: Increase healthy adjustment to current life circumstances  Progress towards Goals: Ongoing  Interventions: Interventions utilized:  Supportive Reflection Standardized Assessments completed: Not Needed  Patient and/or Family Response: Patient agrees with treatment plan.   Assessment: Patient currently experiencing Mood disorder, unspecified; Psychosocial stress.   Patient may benefit from continued therapeutic intervention  .  Plan: Follow up with behavioral health clinician on : Call Rhylin Venters at [phone], as needed. Behavioral recommendations:  -Continue working on increasing adherence to bedtime routine for household after mother moves -Optometrist healthy self care daily -Accept referral to psychiatry -Continue plan to establish with ongoing therapist of choice (list on After Visit Summary) Referral(s): Integrated [employer] (In Clinic)  I discussed the assessment and treatment plan with the patient and/or parent/guardian. They were provided an opportunity to ask questions and all were answered. They agreed with the plan and demonstrated an understanding of the instructions.   They were advised to call back or seek an in-person evaluation if the symptoms worsen or if the condition fails to improve as anticipated.  Angela Close Angela Lupinacci, LCSW     08/05/2023    1:30 PM 02/27/2023    1:20 PM 11/27/2021    5:04 PM 09/20/2021    4:11 PM 04/02/2021    3:40 PM  Depression screen PHQ 2/9  Decreased Interest 1 0 0 0 0  Down, Depressed, Hopeless 0 0 0 0 0  PHQ - 2 Score 1 0 0 0 0  Altered sleeping 3 0 0 0 0  Tired, decreased energy 3 0 0 0 0  Change in appetite 2 0 0 0 0  Feeling bad or failure about yourself  1 0 0 0 0  Trouble concentrating  3 0 0 0 0  Moving slowly or fidgety/restless 0 0 0 0 0  Suicidal thoughts 0 0 0 0 0   PHQ-9 Score 13 0 0 0 0  Difficult doing work/chores     Not difficult at all      08/05/2023    1:33 PM 02/27/2023    1:20 PM 11/27/2021    5:05 PM 09/20/2021    4:11 PM  GAD 7 : Generalized Anxiety Score  Nervous, Anxious, on Edge 1 0 0 0  Control/stop worrying 1 0 0 0  Worry too much - different things 3 0 0 0  Trouble relaxing 1 0 0 0  Restless 0 0 0 0  Easily annoyed or irritable 3 0 0 0  Afraid - awful might happen 0 0 0 0  Total GAD 7 Score 9 0 0 0

## 2023-08-21 ENCOUNTER — Ambulatory Visit (INDEPENDENT_AMBULATORY_CARE_PROVIDER_SITE_OTHER): Payer: Medicaid Other | Admitting: Clinical

## 2023-08-21 DIAGNOSIS — Z658 Other specified problems related to psychosocial circumstances: Secondary | ICD-10-CM

## 2023-08-21 DIAGNOSIS — F39 Unspecified mood [affective] disorder: Secondary | ICD-10-CM | POA: Diagnosis not present

## 2023-08-21 NOTE — Patient Instructions (Signed)
Center for [facility] Healthcare at [facility] for Women [address] [place], [place] [address] [phone] (main office) [phone] (Kimsey Demaree's office)   Ongoing Therapy Options:   Psychology Today [email or web address]  [facility], [address], [place], [place]  [phone] Fax: [phone] [email or web address] *Interpreters available *Accepts all insurance and uninsured for Urgent Care needs *Accepts Medicaid and uninsured for outpatient treatment   [facility]   Mon-Fri: 8am-5pm [address], [place], [place] [phone](QVZDG); [phone](fax) [email or web address]  *Accepts Medicare  [facility] [place], Fri: 8am-4pm [address], [place], [place] [address] [phone] (phone); [phone] (fax) [email or web address]  *Accepts Medicare  [facility] Mon-Fri: 9am-5pm  [address], [place], [place] [phone] (phone); [phone]  [email or web address]  *Accepts Medicaid  [facility], 8:30am-12pm/1pm-2:30pm [address] [place], [place] [phone] (phone); [phone] (fax) [email or web address].[email or web address]  *Accepts Medicaid, sliding-scale*Bilingual services available  [facility] Mon-Fri, 8am-7pm [address] [place], [place]  [phone](WEXHB); [phone](fax) [email or web address].[email or web address]  *Accepts Medicaid *Bilingual services available  [facility] Mon-Fri: 8am-5pm, Saturday by appointment only [address] [place], [place], [place] [phone] (phone); [phone] (fax) [email or web address]   [facility] [address], [place], [place] [phone] [email or web address]  *Free & reduced services for uninsured and underinsured individuals *Bilingual services for  Spanish-speaking clients 21 and under  [facility] Mon-Fri: 9am-5:30pm [address], [place], [place] [phone] (phone), [phone] (fax) After hours crisis line: [phone] [email or web address]  *Accepts Medicare and Medicaid  [employer], 8am-6pm [address], [place], [place]  [phone] (phone); [phone] (fax) [email or web address]  *Subsidized costs available  [facility] Mon-Fri: 8am-4pm [address], [place], [place] [phone](MHDQQ); [phone](fax) [email or web address]  *Accepts Medicaid  [facility] Same day access hours: Mon-Fri, 8:30-3pm Crisis hours: Mon-Fri, 8am-5pm [address] [place], [place] [phone]  [facility] Same day access hours: Mon-Fri, 8:30-3pm Crisis hours: Mon-Fri, 8am-8pm [address], [place], [place] [phone] (phone); [phone] (fax) www.[email or web address]  *Accepts Medicaid and Medicare  [facility] [place], [place], Fri: 9am-9pm Tues, Thurs: 9am-6pm [address] [place], [place], [place]  [phone] (phone); [phone] (fax) [email or web address]  *(Accepts Medicare and Medicaid; payment plans available)*Bilingual services available  [facility] [address], [place], [place] [phone] (phone); [phone] (fax) [email or web address]   [facility] [address], [place], [place]  [phone]  [email or web address]  *Bilingual services available  [facility] Mon-Thurs: 8am-8pm [address], [place], [place] [phone] (phone); [phone] (fax) [email or web address]  *Accepts Medicaid*Bilingual services available  [facility] [address] [place], [place] [phone]  (phone) [email or web address]  *Accepts Medicaid *Bilingual services available  [facility] Mon-Fri, 9am-4:45pm [address], [place], [place] [phone] (phone); [phone] (fax) [email or web address]  *Accepts Medicare  [facility] Mon-Thurs: 8:30-8pm, Fri: 8:30am-7pm [address], [place], [place] (3rd floor) [phone] (phone); [phone] (fax) [email or web address]  *Accepts Medicaid; income-based reduced rates available  [facility] Mon-Fri: 8am-5pm [address], [place], [place] [address] [phone] (phone); [phone] (fax) [email or web address]  *Accepts Medicaid*Bilingual services available

## 2023-09-03 DIAGNOSIS — J45901 Unspecified asthma with (acute) exacerbation: Secondary | ICD-10-CM | POA: Diagnosis not present

## 2023-09-03 DIAGNOSIS — J0100 Acute maxillary sinusitis, unspecified: Secondary | ICD-10-CM | POA: Diagnosis not present

## 2023-09-03 DIAGNOSIS — M25531 Pain in right wrist: Secondary | ICD-10-CM | POA: Diagnosis not present

## 2023-09-03 DIAGNOSIS — S63591A Other specified sprain of right wrist, initial encounter: Secondary | ICD-10-CM | POA: Diagnosis not present

## 2023-09-03 DIAGNOSIS — R051 Acute cough: Secondary | ICD-10-CM | POA: Diagnosis not present

## 2023-09-03 DIAGNOSIS — R0982 Postnasal drip: Secondary | ICD-10-CM | POA: Diagnosis not present

## 2023-09-03 DIAGNOSIS — H65193 Other acute nonsuppurative otitis media, bilateral: Secondary | ICD-10-CM | POA: Diagnosis not present

## 2023-09-03 DIAGNOSIS — R0981 Nasal congestion: Secondary | ICD-10-CM | POA: Diagnosis not present

## 2023-09-03 DIAGNOSIS — J029 Acute pharyngitis, unspecified: Secondary | ICD-10-CM | POA: Diagnosis not present

## 2023-09-03 DIAGNOSIS — R5381 Other malaise: Secondary | ICD-10-CM | POA: Diagnosis not present

## 2023-09-09 ENCOUNTER — Ambulatory Visit (INDEPENDENT_AMBULATORY_CARE_PROVIDER_SITE_OTHER): Payer: Medicaid Other | Admitting: Physician Assistant

## 2023-09-09 VITALS — BP 112/82 | HR 61 | Ht 63.0 in | Wt 150.2 lb

## 2023-09-09 DIAGNOSIS — F331 Major depressive disorder, recurrent, moderate: Secondary | ICD-10-CM | POA: Diagnosis not present

## 2023-09-09 DIAGNOSIS — F4310 Post-traumatic stress disorder, unspecified: Secondary | ICD-10-CM

## 2023-09-09 DIAGNOSIS — F411 Generalized anxiety disorder: Secondary | ICD-10-CM | POA: Diagnosis not present

## 2023-09-09 MED ORDER — TRAZODONE HCL 50 MG PO TABS: 50.0000 mg | ORAL_TABLET | Freq: Every day | ORAL | 1 refills | Status: DC

## 2023-09-09 MED ORDER — ESCITALOPRAM OXALATE 5 MG PO TABS
ORAL_TABLET | ORAL | 1 refills | Status: DC
Start: 2023-09-09 — End: 2023-11-06
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
## 2023-09-11 ENCOUNTER — Encounter (HOSPITAL_COMMUNITY): Payer: Self-pay | Admitting: Physician Assistant

## 2023-09-22 DIAGNOSIS — J019 Acute sinusitis, unspecified: Secondary | ICD-10-CM | POA: Diagnosis not present

## 2023-09-22 DIAGNOSIS — M65841 Other synovitis and tenosynovitis, right hand: Secondary | ICD-10-CM | POA: Diagnosis not present

## 2023-09-22 DIAGNOSIS — J069 Acute upper respiratory infection, unspecified: Secondary | ICD-10-CM | POA: Diagnosis not present

## 2023-09-24 DIAGNOSIS — G8929 Other chronic pain: Secondary | ICD-10-CM | POA: Diagnosis not present

## 2023-09-24 DIAGNOSIS — M25531 Pain in right wrist: Secondary | ICD-10-CM | POA: Diagnosis not present

## 2023-09-24 DIAGNOSIS — N926 Irregular menstruation, unspecified: Secondary | ICD-10-CM | POA: Diagnosis not present

## 2023-09-24 DIAGNOSIS — Z309 Encounter for contraceptive management, unspecified: Secondary | ICD-10-CM | POA: Diagnosis not present

## 2023-09-26 ENCOUNTER — Telehealth: Payer: Self-pay

## 2023-09-26 NOTE — Telephone Encounter (Signed)
Medication managmenet - Prior authorization submitted online with CoverMyMeds for patient's prescribed Escitalopram 5 mg, with instructions 1 a day for 6 days then 2 a day, #60. Called [facility] to inform a prior authorization was not required but that the pharmacy would need to enter an override code for the medication to be filled as written.

## 2023-10-13 DIAGNOSIS — M79641 Pain in right hand: Secondary | ICD-10-CM | POA: Diagnosis not present

## 2023-10-13 DIAGNOSIS — M65931 Unspecified synovitis and tenosynovitis, right forearm: Secondary | ICD-10-CM | POA: Diagnosis not present

## 2023-10-13 DIAGNOSIS — R200 Anesthesia of skin: Secondary | ICD-10-CM | POA: Diagnosis not present

## 2023-10-21 ENCOUNTER — Other Ambulatory Visit: Payer: Self-pay | Admitting: Obstetrics and Gynecology

## 2023-10-21 DIAGNOSIS — R200 Anesthesia of skin: Secondary | ICD-10-CM | POA: Diagnosis not present

## 2023-10-21 DIAGNOSIS — B9689 Other specified bacterial agents as the cause of diseases classified elsewhere: Secondary | ICD-10-CM

## 2023-10-21 DIAGNOSIS — M65931 Unspecified synovitis and tenosynovitis, right forearm: Secondary | ICD-10-CM | POA: Diagnosis not present

## 2023-10-21 DIAGNOSIS — M79641 Pain in right hand: Secondary | ICD-10-CM | POA: Diagnosis not present

## 2023-10-22 ENCOUNTER — Other Ambulatory Visit: Payer: Self-pay

## 2023-10-22 ENCOUNTER — Ambulatory Visit: Payer: Medicaid Other

## 2023-10-22 ENCOUNTER — Other Ambulatory Visit (HOSPITAL_COMMUNITY)
Admission: RE | Admit: 2023-10-22 | Discharge: 2023-10-22 | Disposition: A | Discharge status: Home / Self Care | Payer: Medicaid Other | Source: Ambulatory Visit | Attending: Family Medicine | Admitting: Family Medicine

## 2023-10-22 VITALS — BP 116/73 | HR 76 | Ht 62.5 in | Wt 148.6 lb

## 2023-10-22 DIAGNOSIS — N898 Other specified noninflammatory disorders of vagina: Secondary | ICD-10-CM

## 2023-10-22 DIAGNOSIS — Z202 Contact with and (suspected) exposure to infections with a predominantly sexual mode of transmission: Secondary | ICD-10-CM | POA: Diagnosis not present

## 2023-10-22 NOTE — Progress Notes (Signed)
Here for self swab. States thinks she just finished antibiotics and thinks she may have yeast and took a diflucan. She has had BV before and states it may be that.  She c/o a lot of clear discharge and an odor. Also concerned she may have been exposed to STD and wants full bloodwork also.  If she has BV she prefers metrogel.  Self swab obtained and explained will be contacted if anything positive and needs treatment. She voices understanding. [name]

## 2023-10-23 ENCOUNTER — Other Ambulatory Visit: Payer: Self-pay | Admitting: Family Medicine

## 2023-10-23 ENCOUNTER — Encounter (HOSPITAL_COMMUNITY): Payer: Self-pay | Admitting: Physician Assistant

## 2023-10-23 ENCOUNTER — Ambulatory Visit (HOSPITAL_COMMUNITY): Payer: Medicaid Other | Admitting: Physician Assistant

## 2023-10-23 VITALS — BP 110/74 | HR 75 | Temp 98.3°F | Ht 63.0 in | Wt 149.6 lb

## 2023-10-23 DIAGNOSIS — F411 Generalized anxiety disorder: Secondary | ICD-10-CM

## 2023-10-23 DIAGNOSIS — F4310 Post-traumatic stress disorder, unspecified: Secondary | ICD-10-CM | POA: Diagnosis not present

## 2023-10-23 DIAGNOSIS — F331 Major depressive disorder, recurrent, moderate: Secondary | ICD-10-CM | POA: Diagnosis not present

## 2023-10-23 DIAGNOSIS — B9689 Other specified bacterial agents as the cause of diseases classified elsewhere: Secondary | ICD-10-CM

## 2023-10-23 LAB — CERVICOVAGINAL ANCILLARY ONLY
Bacterial Vaginitis (gardnerella): POSITIVE — AB
Candida Glabrata: NEGATIVE
Candida Vaginitis: NEGATIVE
Chlamydia: NEGATIVE
Comment: NEGATIVE
Comment: NEGATIVE
Comment: NEGATIVE
Comment: NEGATIVE
Comment: NEGATIVE
Comment: NORMAL
Neisseria Gonorrhea: NEGATIVE
Trichomonas: NEGATIVE

## 2023-10-23 LAB — RPR: RPR Ser Ql: NONREACTIVE

## 2023-10-23 LAB — HEPATITIS C ANTIBODY: Hep C Virus Ab: NONREACTIVE

## 2023-10-23 LAB — HIV ANTIBODY (ROUTINE TESTING W REFLEX): HIV Screen 4th Generation wRfx: NONREACTIVE

## 2023-10-23 LAB — HEPATITIS B SURFACE ANTIGEN: Hepatitis B Surface Ag: NEGATIVE

## 2023-10-23 MED ORDER — BUSPIRONE HCL 7.5 MG PO TABS: 7.5000 mg | ORAL_TABLET | Freq: Two times a day (BID) | ORAL | 1 refills | Status: DC

## 2023-10-23 MED ORDER — METRONIDAZOLE 0.75 % VA GEL: 1.0000 | Freq: Every day | VAGINAL | 5 refills | Status: DC

## 2023-10-23 MED ORDER — CLONAZEPAM 0.5 MG PO TABS: 0.2500 mg | ORAL_TABLET | Freq: Two times a day (BID) | ORAL | 0 refills | Status: DC

## 2023-10-23 NOTE — Progress Notes (Signed)
Patient aware of results via Mychart.

## 2023-10-23 NOTE — Progress Notes (Signed)
BH MD/PA/NP OP Progress Note  10/23/2023 7:17 PM Angela Branch  MRN:  [id number]  Chief Complaint:  Chief Complaint  Patient presents with   Follow-up   Medication Management   HPI:   Angela Branch is a 27 year old female with a past psychiatric history significant for PTSD, generalized anxiety disorder, and major depressive disorder who presents to [facility] for follow-up and medication management.  Patient is currently being managed on the following psychiatric medications:  Trazodone 50 mg at bedtime Lexapro 10 mg daily  Patient reports that she stopped taking her trazodone due to the medication giving her terrible nightmares.  She also reports that she does not like using Lexapro.  She does state that the medication has helped with managing her impulsive behavior but states that it has not helped with her overall mood.  Patient also notes that she has not been binge eating as much since being on the medication.  Patient endorses anxiety and states that she has been experiencing panic attacks as a result.  She states that her panic attacks all started after discussion that ended up triggering her.  She reports that the discussion was about how her mother tried to sleep with her partner.  Ever since that discussion, her mood has been all over the place and she has been experiencing panic attacks.  She reports that she has been experiencing crying spells and feelings of impending doom every time she looks at her partner or at his phone.  Patient reports that part of the reason she is triggered by him is because he has a history of infidelity.  Patient rates her anxiety an 8 out of 10.  Patient reports that she feels very dissociated at times and described herself as not being mentally present.  Patient reports that she has had panic attacks over the past 2 weeks at least once a day.  She reports being so triggered that she often closes herself  off in her room.  In regards to depression, patient reports that she has been feeling sad and questioning her purpose in life.  She denies experiencing suicidal ideations at this time.  A PHQ-9 screen was performed with the patient scoring an 11.  A GAD-7 screen was also performed with the patient scoring an 18.  Patient is alert and oriented x 4, calm, cooperative, and fully engaged in conversation during the encounter.  Patient endorses mixed mood stating that she is kind of angry and irritable.  Patient denies suicidal or homicidal ideations.  She further denies auditory or visual hallucinations and does not appear to be responding to internal/external stimuli.  Patient endorses fair sleep and receives on average 4 to 5 hours of sleep per night.  Patient endorses fair appetite and eats on average 1-2 meals per day.  Patient denies alcohol consumption or tobacco use.  Patient endorses illicit drug use in the form of marijuana.  Visit Diagnosis:    ICD-10-CM   1. PTSD (post-traumatic stress disorder)  F43.10     2. Generalized anxiety disorder  F41.1 clonazePAM (KLONOPIN) 0.5 MG tablet    busPIRone (BUSPAR) 7.5 MG tablet    3. Moderate episode of recurrent major depressive disorder (HCC)  F33.1       Past Psychiatric History:  Patient reports that she has been diagnosed with adjustment disorder in the past   Patient denies a past history of hospitalization due to mental health.   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   Needs genetic counseling   Eczema 10/04/2014   Faintness 10/05/2015   Family dynamics problem 03/25/2013   Fetal tachycardia affecting management of  mother 09/21/2018   GBS (group B Streptococcus carrier), +RV culture, currently pregnant 11/14/2015   GERD (gastroesophageal reflux disease) 09/21/2015   H/O scabies 11/14/2015   Treated with permetherin    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  Flu Vaccine   Genetic Screen  NIPS: low risk female      TDaP vaccine    Hgb A1C or  GTT  Third trimester  Rhogam  n/a   LAB RESULTS  Feeding Plan Breast/Bottle Blood Type A/Positive/-- (07/16 1206)  Contraception undecided Antibody Negative (07/16   Thyroid disease     Past Surgical History:  Procedure Laterality Date   TOOTH EXTRACTION     WRIST SURGERY  08/30/2020    Family Psychiatric History:  Patient reports that her family is mentally not stable but nothing has been officially documented   Family history of suicide attempt: Patient reports that her grandfather (maternal) killed himself Family history of homicide attempts: Patient reports that her grandfather (maternal), allegedly killed someone right before taking his own life Family history of substance abuse: Patient reports that her mother and Celine Ahr is on drugs. She reports that her father recently got clean from drugs  Family History:  Family History  Problem Relation Age of Onset   Asthma Father    Diabetes Maternal Grandmother    Hyperlipidemia Paternal Grandfather    Heart disease Paternal Grandfather    Cancer - Colon Maternal Grandfather    Mental illness Neg Hx    Birth defects Neg Hx    Kidney disease Neg Hx    Hypertension Neg Hx     Social History:  Social History   Socioeconomic History    Marital status: Single    Spouse name: Not on file   Number of children: Not on file   Years of education: 9th   Highest education level: Associate degree: academic program  Occupational History    Employer: UNEMPLOYED  Tobacco Use   Smoking status: Never   Smokeless tobacco: Never  Vaping Use   Vaping status: Never Used  Substance and Sexual Activity   Alcohol use: No    Alcohol/week: 0.0 standard drinks of alcohol   Drug use: No   Sexual activity: Not Currently    Birth control/protection: None  Other Topics Concern   Not on file  Social History Narrative   Not on file   Social Determinants of Health   Financial Resource Strain: Low Risk  (09/23/2023)   Received from [facility]   Overall Financial Resource Strain (CARDIA)  Difficulty of Paying Living Expenses: Not hard at all  Food Insecurity: No Food Insecurity (09/23/2023)   Received from [facility]   Hunger Vital Sign    Worried About Running Out of Food in the Last Year: Never true    Ran Out of Food in the Last Year: Never true  Transportation Needs: No Transportation Needs (09/23/2023)   Received from [facility] - Transportation    Lack of Transportation (Medical): No    Lack of Transportation (Non-Medical): No  Physical Activity: Unknown (09/23/2023)   [facility] from [facility]   Exercise Vital Sign    Days of Exercise per Week: 0 days    Minutes of Exercise per Session: Not on file  Stress: No Stress Concern Present (09/23/2023)   Received from [facility] - Occupational Stress Questionnaire    Feeling of Stress : Not at all  Social Connections: Moderately Integrated (09/23/2023)   Received from [facility]   Social Network    How would you rate your social network (family, work, friends)?: Adequate participation with social networks    Allergies:  Allergies  Allergen Reactions   Azithromycin Diarrhea    Diarrhea, nausea/vomiting, IV  burns vein Diarrhea, nausea/vomiting, IV burns vein    Metabolic Disorder Labs: Lab Results  Component Value Date   HGBA1C 5.0 07/25/2021   Lab Results  Component Value Date   PROLACTIN 6.3 04/22/2014   No results found for: "CHOL", "TRIG", "HDL", "CHOLHDL", "VLDL", "LDLCALC" Lab Results  Component Value Date   TSH 1.040 11/05/2021   TSH 0.626 09/06/2021    Therapeutic Level Labs: No results found for: "LITHIUM" No results found for: "VALPROATE" No results found for: "CBMZ"  Current Medications: Current Outpatient Medications  Medication Sig Dispense Refill   busPIRone (BUSPAR) 7.5 MG tablet Take 1 tablet (7.5 mg total) by mouth 2 (two) times daily. 60 tablet 1   clonazePAM (KLONOPIN) 0.5 MG tablet Take 0.5 tablets (0.25 mg total) by mouth 2 (two) times daily. 30 tablet 0   albuterol (VENTOLIN HFA) 108 (90 Base) MCG/ACT inhaler Inhale 2 puffs into the lungs every 6 (six) hours as needed for up to 14 days for wheezing or shortness of breath. 18 g 11   albuterol (VENTOLIN HFA) 108 (90 Base) MCG/ACT inhaler Inhale 1 puff into the lungs every 6 (six) hours as needed (prn). (Patient not taking: Reported on 10/22/2023)     escitalopram (LEXAPRO) 5 MG tablet Take 1 tablet (5 mg total) by mouth daily for 6 days, THEN 2 tablets (10 mg total) daily. 60 tablet 1   fluconazole (DIFLUCAN) 150 MG tablet Take by mouth.     Levonorgestrel-Eth Estradiol (TWIRLA) 120-30 MCG/24HR PTWK Place 1 patch onto the skin once a week. 28 patch 2   metroNIDAZOLE (METROGEL) 0.75 % vaginal gel Place 1 Applicatorful vaginally at bedtime. Apply one applicatorful to vagina at bedtime for 10 days, then twice a week for 6 months. 70 g 5   predniSONE (DELTASONE) 10 MG tablet Take 10 mg by mouth daily with breakfast. (Patient not taking: Reported on 10/22/2023)     Prenatal MV-Min-Fe Fum-FA-DHA (PRENATAL 1 PO) 1 tablet daily.     traZODone (DESYREL) 50 MG tablet Take 1 tablet (50 mg total) by mouth at bedtime.  (Patient not taking: Reported on 10/22/2023) 30 tablet 1   VIENVA 0.1-20 MG-MCG tablet Take 1 tablet by mouth daily. (Patient not taking: Reported on 10/22/2023)  No current facility-administered medications for this visit.     Musculoskeletal: Strength & Muscle Tone: within normal limits Gait & Station: normal Patient leans: N/A  Psychiatric Specialty Exam: Review of Systems  Psychiatric/Behavioral:  Positive for dysphoric mood and sleep disturbance. Negative for decreased concentration, hallucinations, self-injury and suicidal ideas. The patient is nervous/anxious. The patient is not hyperactive.     Blood pressure 110/74, pulse 75, temperature 98.3 F (36.8 C), temperature source Oral, height 5\' 3"  (1.6 m), weight 149 lb 9.6 oz (67.9 kg), SpO2 100%, currently breastfeeding.Body mass index is 26.5 kg/m.  General Appearance: Casual  Eye Contact:  Good  Speech:  Clear and Coherent and Normal Rate  Volume:  Normal  Mood:  Anxious and Depressed  Affect:  Congruent  Thought Process:  Coherent, Goal Directed, and Descriptions of Associations: Intact  Orientation:  Full (Time, Place, and Person)  Thought Content: WDL   Suicidal Thoughts:  No  Homicidal Thoughts:  No  Memory:  Immediate;   Good Recent;   Good Remote;   Good  Judgement:  Good  Insight:  Present  Psychomotor Activity:  Normal  Concentration:  Concentration: Good and Attention Span: Good  Recall:  Good  Fund of Knowledge: Good  Language: Good  Akathisia:  No  Handed:  Right  AIMS (if indicated): not done  Assets:  Communication Skills Desire for Improvement Housing Intimacy Social Support Transportation  ADL's:  Intact  Cognition: WNL  Sleep:  Fair   Screenings: GAD-7    Flowsheet Row Clinical Support from 10/23/2023 in [facility] Office Visit from 09/09/2023 in [facility]al Health from 08/05/2023 in [facility] Office Visit from 02/27/2023 in [facility] Routine Prenatal from 11/27/2021 in [facility]  Total GAD-7 Score 18 20 9  0 0      PHQ2-9    Flowsheet Row Clinical Support from 10/23/2023 in [facility] Office Visit from 09/09/2023 in [facility] Integrated Behavioral Health from 08/05/2023 in [facility] at [facility] Office Visit from 02/27/2023 in [facility] at [facility] Routine Prenatal from 11/27/2021 in [facility] for [facility]  PHQ-2 Total Score 2 3 1  0 0  PHQ-9 Total Score 11 18 13  0 0      Flowsheet Row Clinical Support from 10/23/2023 in [facility] Office Visit from 09/09/2023 in [facility] Admission (Discharged) from 01/17/2022 in [place] [facility]  C-SSRS RISK CATEGORY Moderate Risk Low Risk No Risk        Assessment and Plan:   Angela Branch is a 27 year old female with a past psychiatric history significant for PTSD, generalized anxiety disorder, and major depressive disorder who presents to [facility] for follow-up and medication management.  Patient presents to the encounter stating that she has discontinued her use of trazodone due to the medication causing nightmares.  In regards to her use of Lexapro, patient reports that it has helped with her impulsivity and she states that she has not been binge eating as much.  Although the medication has helped with her impulsivity and binge eating, she reports that it has not helped with her overall  mood.  Patient continues to endorse depressive symptoms as well as for worsening anxiety.  She also reports that she  has been experiencing panic attacks at least once a day for the past 2 weeks.  She reports that her panic attacks are triggered by an uncomfortable discussion she had with her boyfriend and mother.  Patient would like to discontinue her use of Lexapro since it has not been effective in managing her mood.  Patient is interested in being placed on medication to help with her anxiety and panic attacks.  Provider recommended buspirone 7.5 mg 2 times daily for the management of her anxiety.  Provider also recommended a short course of clonazepam 0.25 mg daily as needed for the management of her panic attacks.  Patient was agreeable to recommendations.  Patient's medications to be prescribed to pharmacy of choice.  Provider went over potential adverse side effects to patient's current medication regimen.  Patient vocalized understanding.  Collaboration of Care: Collaboration of Care: Medication Management AEB provider managing patient's psychiatric medications, Primary Care Provider AEB patient being seen by family medicine, Psychiatrist AEB patient being followed by mental health provider at this facility, and Other provider involved in patient's care AEB patient being seen by OB/GYN  Patient/Guardian was advised Release of Information must be obtained prior to any record release in order to collaborate their care with an outside provider. Patient/Guardian was advised if they have not already done so to contact the registration department to sign all necessary forms in order for [name] to release information regarding their care.   Consent: Patient/Guardian gives verbal consent for treatment and assignment of benefits for services provided during this visit. Patient/Guardian expressed understanding and agreed to proceed.   1. PTSD (post-traumatic stress disorder)  2. Generalized anxiety disorder  - clonazePAM (KLONOPIN) 0.5 MG tablet; Take 0.5 tablets (0.25 mg total) by mouth 2 (two) times daily.  Dispense:  30 tablet; Refill: 0 - busPIRone (BUSPAR) 7.5 MG tablet; Take 1 tablet (7.5 mg total) by mouth 2 (two) times daily.  Dispense: 60 tablet; Refill: 1  3. Moderate episode of recurrent major depressive disorder [facility])  Patient to follow up in 6 weeks Provider spent a total of 43 minutes with the patient/reviewing patient's chart  Meta Hatchet, PA 10/23/2023, 7:17 PM

## 2023-11-04 ENCOUNTER — Ambulatory Visit: Payer: Medicaid Other

## 2023-11-04 DIAGNOSIS — Z3480 Encounter for supervision of other normal pregnancy, unspecified trimester: Secondary | ICD-10-CM

## 2023-11-04 DIAGNOSIS — Z3201 Encounter for pregnancy test, result positive: Secondary | ICD-10-CM | POA: Diagnosis not present

## 2023-11-04 DIAGNOSIS — Z3200 Encounter for pregnancy test, result unknown: Secondary | ICD-10-CM

## 2023-11-04 LAB — POCT PREGNANCY, URINE: Preg Test, Ur: POSITIVE — AB

## 2023-11-04 NOTE — Progress Notes (Unsigned)
Cardiology Office Note:  .   Date:  11/06/2023  ID:  Angela Branch, DOB [birth year], MRN [id number] PCP: Aviva Kluver  [facility] Providers Cardiologist:  Armanda Magic, MD    Patient Profile: .      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 Mayford Knife 09/2019.  She was [redacted] weeks pregnant at the time and had genetic testing due to one of her uncles having fully expressed Duchenne's.  No other family members have the disease.  She had syncope in the past only during pregnancy.  EF by echo 09/2019 was 60 to 65%.  Cardiac MRI 11/2020 showed mildly dilated left ventricle with normal thickness and systolic function LVEF 57%, no late gadolinium enhancement in left ventricular myocardium, normal RV size and function, mild MR, trivial pericardial effusion.  Cardiac monitor 11/2021 showed sinus rhythm with rare PVCs and no correlation with her symptoms.  TSH was normal at the time.  Last cardiology clinic visit was 11/11/2022 with Dr. Mayford Knife.  She reported occasional SOB related to asthma.  She reported an occasional sharp pain in her chest that resolves with drinking water.  Occasionally feels a flip-flop of her heartbeat.  Syncope had occurred while pregnant and after going to the bathroom on 2 occasions, felt to be exacerbated by dehydration.       History of Present Illness: .   Angela Branch is a pleasant 27 y.o. female who is here today for evaluation of increased difficulty breathing, chest pain, and chest tightness. She describes the sensation as if someone is sitting on her chest, making it painful to take deep breaths. Symptoms have been progressively worsening, causing her significant distress. One recent episode of distress that made her feel like she would pass out, she could not get a deep breath in and heart rate sped up. She uses an albuterol  inhaler which usually only provides minimal relief and sometimes exacerbates her symptoms by causing her heart to beat faster. She asks about the "tear in her heart" which was a misunderstanding of mild mitral regurgitation on echo 11/2022.  She is concerned about the potential impact of this condition on her current symptoms and overall health, especially given her recent pregnancy. She has been monitoring her oxygen levels at home and reports that the lowest reading she has seen was 94 while lying down. She has history of passing out during pregnancy.  She denies orthopnea, PND, edema, syncope.  Discussed the use of AI scribe software for clinical note transcription with the patient, who gave verbal consent to proceed.   ROS: See HPI       Studies Reviewed: Marland Kitchen   EKG Interpretation Date/Time:  Thursday November 06 2023 15:24:43 EST Ventricular Rate:  87 PR Interval:  120 QRS Duration:  74 QT Interval:  346 QTC Calculation: 416 R Axis:   37  Text Interpretation: Normal sinus rhythm Normal ECG When compared with ECG of 06-Sep-2021 13:23, No significant change was found No ST abnormality Confirmed by Eligha Bridegroom [phone]) on 11/06/2023 3:37:34 PM     Risk Assessment/Calculations:             Physical Exam:   VS:  BP 96/62 (BP Location: Left Arm, Patient Position: Sitting, Cuff Size: Normal)   Pulse 90   Resp 16   Ht 5\' 3"  (1.6 m)   Wt 154 lb 12.8 oz (  70.2 kg)   LMP 07/09/2023 (Approximate) Comment: unsure of date  SpO2 98%   BMI 27.42 kg/m    Wt Readings from Last 3 Encounters:  11/06/23 154 lb 12.8 oz (70.2 kg)  10/22/23 148 lb 9.6 oz (67.4 kg)  07/30/23 154 lb 3.2 oz (69.9 kg)    GEN: Well nourished, well developed in no acute distress NECK: No JVD; No carotid bruits CARDIAC: RRR, no murmurs, rubs, gallops RESPIRATORY:  Clear to auscultation without rales, wheezing or rhonchi  ABDOMEN: Soft, non-tender, non-distended EXTREMITIES:  No edema; No deformity      ASSESSMENT AND PLAN: .    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 It is difficult to discern if muscular dystrophy is contributing to her symptoms.  Encouraged consideration of referral to a specialist.  She has an uncle who has Duchenne's but he lives in another state.  Chest tightness/Palpitations/SOB: Increased shortness of breath and chest tightness. She is describing symptoms likely caused by untreated asthma as she is only on a rescue inhaler. Lengthy discussion about  the need for anti-inflammatory and/or steroids for management of asthma. We will refer her to pulmonology for further management and consideration of additional asthma therapies. Consider repeat cardiac monitor if palpitations do not improve with treatment of asthma. Encouraged healthy lifestyle habits including regular exercise, healthy diet, and avoidance of irritants. Consider repeating heart monitor if heart rate remains bothersome after asthma is better controlled.  Mitral regurgitation: Trivial mitral regurgitation on echo 12/04/2022.  Reassurance provided given trivial nature of the valve dysfunction. I do not appreciate a significant murmur on exam. We will continue to monitor clinically for now.        Dispo: 1 year with Dr. Mayford Knife   Signed, Eligha Bridegroom, NP-C

## 2023-11-04 NOTE — Progress Notes (Signed)
Possible Pregnancy  Patient dropped off urine today for pregnancy confirmation. UPT in office today is positive. Called patient regarding results. Pt reports first positive home UPT on 11/02/2023. Reviewed dating with patient:    LMP: 09/25/2023 EDD: 07/01/2024 5 w 5 d today  Patient scheduled for dating and viability [name] for 11/27 at 10:15 AM. Notified patient that the front office will contact patient regarding new OB intake appointment. Patient verbalized understanding and all questions were answered.   Quintella Reichert, RN

## 2023-11-05 ENCOUNTER — Telehealth: Payer: Self-pay | Admitting: General Practice

## 2023-11-05 DIAGNOSIS — Z3480 Encounter for supervision of other normal pregnancy, unspecified trimester: Secondary | ICD-10-CM

## 2023-11-05 DIAGNOSIS — Z148 Genetic carrier of other disease: Secondary | ICD-10-CM

## 2023-11-05 NOTE — Telephone Encounter (Signed)
Patient called into front office concerned about her appts being so far out and wants something asap. 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   Called MFM and scheduled genetic counseling appt for Friday at 9am. Called patient and discussed with her I feel this is the best option to start out with as they can go over all the options with her and then we can go from there and adjust appts with [name] if need be after she meets with them. Patient verbalized understanding and felt good about that plan.

## 2023-11-06 ENCOUNTER — Encounter: Payer: Self-pay | Admitting: Nurse Practitioner

## 2023-11-06 ENCOUNTER — Ambulatory Visit: Payer: Medicaid Other | Attending: Nurse Practitioner | Admitting: Nurse Practitioner

## 2023-11-06 VITALS — BP 96/62 | HR 90 | Resp 16 | Ht 63.0 in | Wt 154.8 lb

## 2023-11-06 DIAGNOSIS — R0602 Shortness of breath: Secondary | ICD-10-CM | POA: Diagnosis not present

## 2023-11-06 DIAGNOSIS — I340 Nonrheumatic mitral (valve) insufficiency: Secondary | ICD-10-CM

## 2023-11-06 DIAGNOSIS — R002 Palpitations: Secondary | ICD-10-CM

## 2023-11-06 DIAGNOSIS — J4540 Moderate persistent asthma, uncomplicated: Secondary | ICD-10-CM | POA: Diagnosis not present

## 2023-11-06 DIAGNOSIS — R072 Precordial pain: Secondary | ICD-10-CM | POA: Diagnosis not present

## 2023-11-06 DIAGNOSIS — Z148 Genetic carrier of other disease: Secondary | ICD-10-CM | POA: Diagnosis not present

## 2023-11-06 NOTE — Patient Instructions (Signed)
Medication Instructions:   Your physician recommends that you continue on your current medications as directed. Please refer to the Current Medication list given to you today.   *If you need a refill on your cardiac medications before your next appointment, please call your pharmacy*   Lab Work:  None ordered.  If you have labs (blood work) drawn today and your tests are completely normal, you will receive your results only by: MyChart Message (if you have MyChart) OR A paper copy in the mail If you have any lab test that is abnormal or we need to change your treatment, we will call you to review the results.   Testing/Procedures:  None ordered.   Follow-Up: At [facility], you and your health needs are our priority.  As part of our continuing mission to provide you with exceptional heart care, we have created designated Provider Care Teams.  These Care Teams include your primary Cardiologist (physician) and Advanced Practice Providers (APPs -  Physician Assistants and Nurse Practitioners) who all work together to provide you with the care you need, when you need it.  We recommend signing up for the patient portal called "MyChart".  Sign up information is provided on this After Visit Summary.  MyChart is used to connect with patients for Virtual Visits (Telemedicine).  Patients are able to view lab/test results, encounter notes, upcoming appointments, etc.  Non-urgent messages can be sent to your provider as well.   To learn more about what you can do with MyChart, go to [email or web address].    Your next appointment:   1 year(s)  Provider:   Armanda Magic, MD     Other Instructions  Your physician wants you to follow-up in: 1 year.  You will receive a reminder letter in the mail two months in advance. If you don't receive a letter, please call our office to schedule the follow-up appointment.  You have been referred to Pulmonology for Asthma.

## 2023-11-07 ENCOUNTER — Ambulatory Visit: Payer: Medicaid Other | Attending: Family Medicine

## 2023-11-07 DIAGNOSIS — Z148 Genetic carrier of other disease: Secondary | ICD-10-CM

## 2023-11-07 NOTE — Progress Notes (Signed)
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 She was referred by Tinnie Gens, MD.  Pregnancy History:   [id number]. EGA: [redacted]w[redacted]d by LMP 07/09/2023. This is Ismahan's sixth pregnancy. She has four living children. She has had one early loss. Reports she takes anxiety medication. Denies bleeding, infections, and fevers in this pregnancy. Denies using tobacco, alcohol, or street drugs in this pregnancy.  Family History:   A third-generation pedigree was drawn by Joyce Gross, MS, CGC and uploaded into [facility] 08/25/2019.  Significant updates include Merikay has an 48 yo son with BMD/DMD who was diagnosed at 25 yo. She has a 23 yo son who tested negative for DMD/BMD. She has a 74 yo daughter who is a carrier for DMD/BMD. She has a 2 yo son who is affected with DMD/BMD.  Her father's maternal uncle who was reported to have a muscular dystrophy may have multiple sclerosis instead. No other information is known.  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  Due to Jetty's experience of DMD/BMD in her children and her previous genetic counseling appointments, Konya is well versed in the conditions, including the X-linked inheritance, clinical features, and treatment types. She has been aware of her carrier status since 2020. Because of this and the nature of today's discussion, we did not delve into the details of the conditions. Please refer to previous genetic counseling note on 08/25/2019 by Joyce Gross, MS, CGC for details about DMD/BMD clinical symptoms.   We discussed that she has  a 50% chance of passing this pathogenic variant to each of her pregnancies. There is a 50% chance that each female pregnancy will be affected with DMD/BMD. There is a 50% chance that each female pregnancy would be a carrier for DMD/BMD.   Since we do not know the sex of the fetus, we discussed several testing options. First, we will wait for a dating ultrasound to be performed, as Verenise reported that the LMP may be unreliable. We discussed performing noninvasive prenatal screening (NIPS) at 9-10 weeks to determine the sex of the fetus, keeping in mind that this is a screening test that may provide a false positive for fetal sex. We also discussed performing chorionic villus sampling (CVS) at around [redacted] weeks gestation, which will provide diagnostic information about the fetus's DMD/BMD status. The technical aspects, benefits, limitations, and risks of CVS including the 1 in 500 risk for miscarriage and the chance of confined placental mosaicism were reviewed. A later diagnostic option would be amniocentesis starting at around [redacted] weeks gestation, which was also reviewed with her. Ameisha shared that she wants information as soon as possible.   Fendi shared that she may terminate the pregnancy depending on the results and the timing of the results. We reviewed the termination laws and limits in [place] as well as neighboring states such as [place]. We discussed that we can refer her to [facility], [place], or [facility]. We reviewed that in the rare event Down syndrome is identified in the pregnancy, termination would not be feasible in [place].  Destinie shared that  her sons are being followed at [facility]. She reports that the clinic has not yet classified if her 39 yo son is affected with DMD or BMD, since his symptoms may be slightly milder than those of DMD at his age. She and her daughter are being followed by cardiology. She is aware of the increased risk for cardiac abnormalities in up to 50% of  DMD/BMD carriers.   Pearl expressed difficulty in having her family members be screened for DMD/BMD. She is concerned about her female relatives' carrier status. We discussed and offered writing her a family letter than can be offered to her relatives. She is interested, and we will write and send one to her as soon as possible. She may then share the letter with her family members.  We agreed to wait for Jamyrah's dating ultrasound. Depending on those results, we will communicate NIPS vs. CVS.  Finally, for the other 13 conditions screened for, Erminia was not found to be a carrier. Please see report for details. A negative result on carrier screening reduces but does not eliminate the chance of being a carrier.  Patient Plan:  Patient will have a dating ultrasound and will contact me when that is complete. We will then discuss NIPS vs CVS and have those arranged.  Informed consent was obtained. All questions were answered.  I spent 60 minutes in the care of the patient today, including face-to-face time reviewing and  discussing the genetic test results and available next steps.   Thank you for sharing in the care of Angela Branch with [name].  Please do not hesitate to contact us at [phone] if you have any questions.  Sheppard Plumber, MS Genetic Counselor  Genetic counseling student involved in appointment: Yes Delorise Jackson Ladean Raya.)

## 2023-11-10 ENCOUNTER — Encounter: Payer: Self-pay | Admitting: Obstetrics & Gynecology

## 2023-11-11 ENCOUNTER — Other Ambulatory Visit: Payer: Self-pay

## 2023-11-11 MED ORDER — VALACYCLOVIR HCL 1 G PO TABS: 1000.0000 mg | ORAL_TABLET | Freq: Every day | ORAL | 0 refills | Status: DC

## 2023-11-12 ENCOUNTER — Other Ambulatory Visit: Payer: Self-pay

## 2023-11-12 ENCOUNTER — Inpatient Hospital Stay (HOSPITAL_COMMUNITY)
Admission: AD | Admit: 2023-11-12 | Discharge: 2023-11-13 | Disposition: A | Discharge status: Home / Self Care | Payer: Medicaid Other | Attending: Obstetrics and Gynecology | Admitting: Obstetrics and Gynecology

## 2023-11-12 ENCOUNTER — Inpatient Hospital Stay (HOSPITAL_COMMUNITY): Payer: Medicaid Other

## 2023-11-12 ENCOUNTER — Encounter (HOSPITAL_COMMUNITY): Payer: Self-pay | Admitting: Obstetrics and Gynecology

## 2023-11-12 DIAGNOSIS — Z3A01 Less than 8 weeks gestation of pregnancy: Secondary | ICD-10-CM | POA: Insufficient documentation

## 2023-11-12 DIAGNOSIS — R112 Nausea with vomiting, unspecified: Secondary | ICD-10-CM | POA: Diagnosis present

## 2023-11-12 DIAGNOSIS — Z3401 Encounter for supervision of normal first pregnancy, first trimester: Secondary | ICD-10-CM | POA: Diagnosis not present

## 2023-11-12 DIAGNOSIS — Z3201 Encounter for pregnancy test, result positive: Secondary | ICD-10-CM | POA: Insufficient documentation

## 2023-11-12 DIAGNOSIS — N939 Abnormal uterine and vaginal bleeding, unspecified: Secondary | ICD-10-CM

## 2023-11-12 DIAGNOSIS — O26611 Liver and biliary tract disorders in pregnancy, first trimester: Secondary | ICD-10-CM | POA: Insufficient documentation

## 2023-11-12 DIAGNOSIS — R160 Hepatomegaly, not elsewhere classified: Secondary | ICD-10-CM | POA: Diagnosis not present

## 2023-11-12 DIAGNOSIS — O209 Hemorrhage in early pregnancy, unspecified: Secondary | ICD-10-CM | POA: Insufficient documentation

## 2023-11-12 DIAGNOSIS — Z3490 Encounter for supervision of normal pregnancy, unspecified, unspecified trimester: Secondary | ICD-10-CM

## 2023-11-12 DIAGNOSIS — N941 Unspecified dyspareunia: Secondary | ICD-10-CM | POA: Diagnosis present

## 2023-11-12 DIAGNOSIS — O219 Vomiting of pregnancy, unspecified: Secondary | ICD-10-CM | POA: Insufficient documentation

## 2023-11-12 DIAGNOSIS — R1030 Lower abdominal pain, unspecified: Secondary | ICD-10-CM | POA: Diagnosis present

## 2023-11-12 LAB — COMPREHENSIVE METABOLIC PANEL
ALT: 16 U/L (ref 0–44)
AST: 19 U/L (ref 15–41)
Albumin: 4 g/dL (ref 3.5–5.0)
Alkaline Phosphatase: 32 U/L — ABNORMAL LOW (ref 38–126)
Anion gap: 9 (ref 5–15)
BUN: 9 mg/dL (ref 6–20)
CO2: 22 mmol/L (ref 22–32)
Calcium: 8.8 mg/dL — ABNORMAL LOW (ref 8.9–10.3)
Chloride: 102 mmol/L (ref 98–111)
Creatinine, Ser: 0.78 mg/dL (ref 0.44–1.00)
GFR, Estimated: >60 mL/min (ref >60)
Glucose, Bld: 80 mg/dL (ref 70–99)
Potassium: 3.6 mmol/L (ref 3.5–5.1)
Sodium: 133 mmol/L — ABNORMAL LOW (ref 135–145)
Total Bilirubin: 2.6 mg/dL — ABNORMAL HIGH (ref <1.2)
Total Protein: 6.8 g/dL (ref 6.5–8.1)

## 2023-11-12 LAB — CBC
HCT: 40.5 % (ref 36.0–46.0)
Hemoglobin: 13.9 g/dL (ref 12.0–15.0)
MCH: 30.1 pg (ref 26.0–34.0)
MCHC: 34.3 g/dL (ref 30.0–36.0)
MCV: 87.7 fL (ref 80.0–100.0)
Platelets: 210 10*3/uL (ref 150–400)
RBC: 4.62 MIL/uL (ref 3.87–5.11)
RDW: 12.9 % (ref 11.5–15.5)
WBC: 11.7 10*3/uL — ABNORMAL HIGH (ref 4.0–10.5)
nRBC: 0 % (ref 0.0–0.2)

## 2023-11-12 LAB — URINALYSIS, ROUTINE W REFLEX MICROSCOPIC
Bilirubin Urine: NEGATIVE
Glucose, UA: NEGATIVE mg/dL
Hgb urine dipstick: NEGATIVE
Ketones, ur: 5 mg/dL — AB
Leukocytes,Ua: NEGATIVE
Nitrite: NEGATIVE
Protein, ur: 30 mg/dL — AB
Specific Gravity, Urine: 1.03 (ref 1.005–1.030)
pH: 6 (ref 5.0–8.0)

## 2023-11-12 LAB — HCG, QUANTITATIVE, PREGNANCY: hCG, Beta Chain, Quant, S: 56140 m[IU]/mL — ABNORMAL HIGH (ref <5)

## 2023-11-12 NOTE — MAU Note (Signed)
.  Angela Branch is a 27 y.o. at [redacted]w[redacted]d here in MAU reporting: [name] scheduled for next week but has been having ongoing pain - lower abdominal cramping, back pain, pain with sex, and N/V. Reports she had BV 3-4 weeks ago and she completed the medication, but unsure if that is still causing problems. Reports spotting after sex 2 days ago, but denies significant VB - none now and denies abnormal discharge.  LMP: 10/3 Onset of complaint: "progressively worse over the past week" Pain score: 5 - abdomen; 3 - back  Vitals:   11/12/23 1930  BP: 111/63  Pulse: 79  Resp: 17  Temp: 98.7 F (37.1 C)  SpO2: 99%     FHT:NA  Lab orders placed from triage:  UA

## 2023-11-13 ENCOUNTER — Other Ambulatory Visit (HOSPITAL_COMMUNITY)
Admission: RE | Admit: 2023-11-13 | Discharge: 2023-11-13 | Disposition: A | Discharge status: Home / Self Care | Payer: Medicaid Other | Source: Ambulatory Visit | Attending: Family Medicine | Admitting: Family Medicine

## 2023-11-13 ENCOUNTER — Ambulatory Visit: Payer: Medicaid Other | Admitting: *Deleted

## 2023-11-13 VITALS — BP 112/70 | HR 79 | Ht 63.0 in | Wt 146.5 lb

## 2023-11-13 DIAGNOSIS — N898 Other specified noninflammatory disorders of vagina: Secondary | ICD-10-CM

## 2023-11-13 LAB — ABO/RH: ABO/RH(D): A POS

## 2023-11-13 MED ORDER — ONDANSETRON HCL 4 MG PO TABS: 4.0000 mg | ORAL_TABLET | Freq: Four times a day (QID) | ORAL | 0 refills | Status: DC

## 2023-11-13 MED ORDER — DOXYLAMINE-PYRIDOXINE 10-10 MG PO TBEC: 2.0000 | DELAYED_RELEASE_TABLET | Freq: Every day | ORAL | 0 refills | Status: DC

## 2023-11-13 NOTE — Progress Notes (Signed)
Here for c/o vaginal discharge and vaginal cramping.Thinks she has BV again.Self swab obtained and explained she will be contacted if positive and needs treatment. She voices understanding. [name]

## 2023-11-13 NOTE — Discharge Instructions (Signed)
FOR NAUSEA AND VOMITING: You can buy Unisom (Doxylamine) 25mg  tablets and Vitamin B6 (usually sold in 100mg  tablets) daily -- take half a tablet of Unisom (12.5mg  dose) and a tablet of Vitamin B6 every night. This would be in place of Diclegis if Diclegis is not covered by your insurance.  **The Unisom may make you drowsy! Please do not take this if you are going to be operating heavy machinery**

## 2023-11-13 NOTE — MAU Provider Note (Signed)
History     CSN: [id number]  Arrival date and time: 11/12/23 1851   Event Date/Time   First Provider Initiated Contact with Patient    Chief Complaint  Patient presents with   Abdominal Pain   Back Pain   Nausea   Emesis    HPI  Angela Branch is a 27 y.o. [id number] at [redacted]w[redacted]d who presents to the MAU for abdominal cramping, spotting. Most of her abd pain seems to be in RUQ. Having n/v of preg, no recent fevers, chills or changes in BM. She reports spotting two days ago after intercourse. Otherwise, no urinary sxs, VB since episode 2 days ago.  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  Flu Vaccine   Genetic Screen  NIPS: low risk female      TDaP vaccine    Hgb A1C or  GTT  Third trimester  Rhogam  n/a   LAB RESULTS  Feeding Plan Breast/Bottle Blood Type A/Positive/-- (07/16 1206)  Contraception undecided Antibody Negative (07/16   Thyroid disease     Past Surgical History:  Procedure Laterality Date   TOOTH EXTRACTION     WRIST SURGERY   08/30/2020    Family History  Problem Relation Age of Onset   Asthma Father    Asthma Sister    Healthy Sister    Healthy Sister    Healthy Brother    Healthy Brother    Diabetes Maternal Grandmother    Cancer - Colon Maternal Grandfather    Hyperlipidemia Paternal Grandfather    Heart disease Paternal Grandfather    Mental illness Neg Hx    Birth defects Neg Hx    Kidney disease Neg Hx    Hypertension Neg Hx     Social History   Tobacco Use   Smoking status: Never   Smokeless tobacco: Never  Vaping Use   Vaping status: Never Used  Substance Use Topics   Alcohol use: No    Alcohol/week: 0.0 standard drinks of alcohol   Drug use: No    Allergies:  Allergies  Allergen Reactions   Azithromycin Diarrhea    Diarrhea, nausea/vomiting, IV burns vein Diarrhea, nausea/vomiting, IV burns vein    No medications prior to admission.    ROS reviewed and pertinent positives and negatives as documented in HPI.  Physical Exam   Blood pressure 121/76, pulse 76, temperature 98.7 F (37.1 C), resp. rate 17, height 5\' 3"  (1.6 m), weight 68 kg, last menstrual period 07/09/2023,  SpO2 99%, currently breastfeeding.  Physical Exam Constitutional:      General: She is not in acute distress.    Appearance: Normal appearance. She is not ill-appearing.  HENT:     Head: Normocephalic and atraumatic.  Cardiovascular:     Rate and Rhythm: Normal rate.  Pulmonary:     Effort: Pulmonary effort is normal.     Breath sounds: Normal breath sounds.  Abdominal:     Palpations: Abdomen is soft.     Tenderness: There is no abdominal tenderness. There is no guarding. Negative signs include Murphy's sign.  Musculoskeletal:        General: Normal range of motion.  Skin:    General: Skin is warm and dry.     Findings: No rash.  Neurological:     General: No focal deficit present.     Mental Status: She is alert and oriented to person, place, and time.     MAU Course   Procedures  MDM 27 y.o. [id number] at [redacted]w[redacted]d presenting for abdominal cramping and spotting. She is hemodynamically stable and well appearing. Labs remarkable for Rh positive status, UA neg, HgB ok, bili elevated at 2.6, has historically been high. U/S with live IUP with FHR low normal. Ordered RUQ to further investigate elevated bili. 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 Suspect she may have most of her abd pain from frequent vomiting w n/v early pregnancy, some component may be 2/2 ?liver mass c/f hemangioma, but less likely. Results discussed with pt in detail. Plan for further evaluation of hemangioma can be done on outpatient basis. Return precautions for VB, abd pain discussed. Stable for d/c.  Assessment and Plan  Liver mass - Plan: Discharge patient Interval increase in size as compared to measurements in 2022  Will need dedicated nonemergent MRI for further characterization Referral to general surgeon provided  Vaginal bleeding  Intrauterine pregnancy - Plan: Discharge patient Abd pain related to n/v vs corpus luteum cramping vs less likely to be from mass as above Zofran and Diclegis Rx sent IUP noted on U/S  Reassurance and return precautions discussed  Sundra Aland, MD OB Fellow, Faculty Practice [facility], [facility] for [facility]  11/13/2023, 1:06 AM

## 2023-11-14 LAB — CERVICOVAGINAL ANCILLARY ONLY
Bacterial Vaginitis (gardnerella): POSITIVE — AB
Candida Glabrata: NEGATIVE
Candida Vaginitis: NEGATIVE
Chlamydia: NEGATIVE
Comment: NEGATIVE
Comment: NEGATIVE
Comment: NEGATIVE
Comment: NEGATIVE
Comment: NEGATIVE
Comment: NORMAL
Neisseria Gonorrhea: NEGATIVE
Trichomonas: NEGATIVE

## 2023-11-16 ENCOUNTER — Encounter (HOSPITAL_COMMUNITY): Payer: Self-pay | Admitting: Obstetrics & Gynecology

## 2023-11-16 ENCOUNTER — Inpatient Hospital Stay (HOSPITAL_COMMUNITY)
Admission: AD | Admit: 2023-11-16 | Discharge: 2023-11-17 | Disposition: A | Discharge status: Home / Self Care | Payer: Medicaid Other | Attending: Obstetrics & Gynecology | Admitting: Obstetrics & Gynecology

## 2023-11-16 DIAGNOSIS — O99611 Diseases of the digestive system complicating pregnancy, first trimester: Secondary | ICD-10-CM | POA: Diagnosis not present

## 2023-11-16 DIAGNOSIS — R1013 Epigastric pain: Secondary | ICD-10-CM | POA: Insufficient documentation

## 2023-11-16 DIAGNOSIS — K219 Gastro-esophageal reflux disease without esophagitis: Secondary | ICD-10-CM | POA: Diagnosis not present

## 2023-11-16 DIAGNOSIS — Z3A01 Less than 8 weeks gestation of pregnancy: Secondary | ICD-10-CM | POA: Diagnosis not present

## 2023-11-16 DIAGNOSIS — R1110 Vomiting, unspecified: Secondary | ICD-10-CM | POA: Diagnosis present

## 2023-11-16 DIAGNOSIS — O211 Hyperemesis gravidarum with metabolic disturbance: Secondary | ICD-10-CM | POA: Insufficient documentation

## 2023-11-16 DIAGNOSIS — E860 Dehydration: Secondary | ICD-10-CM | POA: Diagnosis not present

## 2023-11-16 DIAGNOSIS — K929 Disease of digestive system, unspecified: Secondary | ICD-10-CM | POA: Insufficient documentation

## 2023-11-16 DIAGNOSIS — R112 Nausea with vomiting, unspecified: Secondary | ICD-10-CM | POA: Diagnosis not present

## 2023-11-16 DIAGNOSIS — R102 Pelvic and perineal pain: Secondary | ICD-10-CM | POA: Diagnosis present

## 2023-11-16 LAB — URINALYSIS, ROUTINE W REFLEX MICROSCOPIC
Bilirubin Urine: NEGATIVE
Glucose, UA: NEGATIVE mg/dL
Hgb urine dipstick: NEGATIVE
Ketones, ur: 80 mg/dL — AB
Leukocytes,Ua: NEGATIVE
Nitrite: NEGATIVE
Protein, ur: 100 mg/dL — AB
Specific Gravity, Urine: 1.035 — ABNORMAL HIGH (ref 1.005–1.030)
pH: 5 (ref 5.0–8.0)

## 2023-11-16 LAB — COMPREHENSIVE METABOLIC PANEL
ALT: 16 U/L (ref 0–44)
AST: 20 U/L (ref 15–41)
Albumin: 4.3 g/dL (ref 3.5–5.0)
Alkaline Phosphatase: 35 U/L — ABNORMAL LOW (ref 38–126)
Anion gap: 11 (ref 5–15)
BUN: 8 mg/dL (ref 6–20)
CO2: 22 mmol/L (ref 22–32)
Calcium: 9.1 mg/dL (ref 8.9–10.3)
Chloride: 103 mmol/L (ref 98–111)
Creatinine, Ser: 0.7 mg/dL (ref 0.44–1.00)
GFR, Estimated: >60 mL/min (ref >60)
Glucose, Bld: 75 mg/dL (ref 70–99)
Potassium: 3.3 mmol/L — ABNORMAL LOW (ref 3.5–5.1)
Sodium: 136 mmol/L (ref 135–145)
Total Bilirubin: 2.2 mg/dL — ABNORMAL HIGH (ref <1.2)
Total Protein: 7.3 g/dL (ref 6.5–8.1)

## 2023-11-16 LAB — CBC
HCT: 41.5 % (ref 36.0–46.0)
Hemoglobin: 14.3 g/dL (ref 12.0–15.0)
MCH: 30 pg (ref 26.0–34.0)
MCHC: 34.5 g/dL (ref 30.0–36.0)
MCV: 87 fL (ref 80.0–100.0)
Platelets: 222 10*3/uL (ref 150–400)
RBC: 4.77 MIL/uL (ref 3.87–5.11)
RDW: 12.5 % (ref 11.5–15.5)
WBC: 10.2 10*3/uL (ref 4.0–10.5)
nRBC: 0 % (ref 0.0–0.2)

## 2023-11-16 MED ORDER — SODIUM CHLORIDE 0.9 % IV SOLN
12.5000 mg | Freq: Once | INTRAVENOUS | Status: AC
  Administered 2023-11-16: 12.5 mg via INTRAVENOUS
  Filled 2023-11-16: qty 12.5

## 2023-11-16 MED ORDER — FAMOTIDINE IN NACL 20-0.9 MG/50ML-% IV SOLN
20.0000 mg | Freq: Once | INTRAVENOUS | Status: AC
  Administered 2023-11-16: 20 mg via INTRAVENOUS
  Filled 2023-11-16: qty 50

## 2023-11-16 MED ORDER — ALUM & MAG HYDROXIDE-SIMETH 200-200-20 MG/5ML PO SUSP
30.0000 mL | Freq: Once | ORAL | Status: AC
  Administered 2023-11-16: 30 mL via ORAL
  Filled 2023-11-16: qty 30

## 2023-11-16 MED ORDER — LACTATED RINGERS IV SOLN: Freq: Once | INTRAVENOUS | Status: AC

## 2023-11-16 MED ORDER — DEXTROSE 5 % IN LACTATED RINGERS IV BOLUS: 1000.0000 mL | Freq: Once | INTRAVENOUS | Status: DC

## 2023-11-16 NOTE — MAU Note (Signed)
.  Angela Branch is a 28 y.o. at [redacted]w[redacted]d here in MAU reporting: vomiting "non stop x ' several day"/throat is burning and she reports tasting blood. Abdominal pain in upper midabdomen, and lower uterine area Diclegis and zofran will not stay down. Unable to obtain OB appointment until December 19. Pt states she feels that "she will make survive that long in her current condition" Denis vaginal bleeding-states she has been told she has BV but does not have the prescription and does not feel she would tolerate it. 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

## 2023-11-16 NOTE — MAU Provider Note (Signed)
Chief Complaint: Emesis, Nausea, Back Pain, and Fatigue   Event Date/Time   First Provider Initiated Contact with Patient 11/16/23 2119        SUBJECTIVE HPI: Angela Branch is a 27 y.o. [id number] at [redacted]w[redacted]d by LMP who presents to maternity admissions reporting vomiting and acid reflux with epigastric pain.  Has taken Diclegis and Zofran but could not keep them down.  . She denies vaginal bleeding, vaginal itching/burning, urinary symptoms, h/a, dizziness, n/v, or fever/chills.    Was seen a few days ago and had blood work, RUQ [name] and OB US done.  There was no evidence of Gallstones.  There was a mass (presumed hemangioma) seen which was somewhat larger than last time it was imaged.  Has followup scheduled for that.    Emesis  This is a recurrent problem. Associated symptoms include abdominal pain. Pertinent negatives include no chills, diarrhea or fever. Treatments tried: diclegis and Zofran.   RN Note: Angela Branch is a 27 y.o. at [redacted]w[redacted]d here in MAU reporting: vomiting "non stop x ' several day"/throat is burning and she reports tasting blood. Abdominal pain in upper midabdomen, and lower uterine area Diclegis and zofran will not stay down. Unable to obtain OB appointment until December 19. Pt states she feels that "she will make survive that long in her current condition" Denis vaginal bleeding-states she has been told she has BV but does not have the prescription and does not feel she would tolerate it. 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   Needs genetic counseling   Eczema 10/04/2014   Faintness 10/05/2015    Family dynamics problem 03/25/2013   Fetal tachycardia affecting management of mother 09/21/2018   GBS (group B Streptococcus carrier), +RV culture, currently pregnant 11/14/2015   GERD (gastroesophageal reflux disease) 09/21/2015   H/O scabies 11/14/2015   Treated with permetherin    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  Flu Vaccine   Genetic Screen  NIPS: low risk female      TDaP vaccine    Hgb A1C or  GTT  Third trimester  Rhogam  n/a   LAB RESULTS  Feeding Plan Breast/Bottle Blood Type A/Positive/-- (07/16 1206)  Contraception undecided Antibody Negative (07/16   Thyroid disease    Past Surgical History:  Procedure Laterality Date   TOOTH EXTRACTION     WRIST SURGERY  08/30/2020   Social History   Socioeconomic History   Marital status: Single    Spouse name: Not on file   Number of children: 4   Years of education: 9th   Highest education level: Associate degree: academic program  Occupational History    Employer: UNEMPLOYED  Tobacco Use   Smoking status: Never   Smokeless tobacco: Never  Vaping Use   Vaping status: Never Used  Substance and Sexual Activity   Alcohol use: No    Alcohol/week: 0.0 standard drinks of alcohol   Drug use: No   Sexual activity: Not Currently    Birth control/protection: None  Other Topics Concern   Not on file  Social History Narrative   Patient is currently pregnant with her 5th child   Social Determinants of Health   Financial Resource Strain: Low Risk  (09/23/2023)   Received from [facility]   Overall Financial Resource Strain (CARDIA)    Difficulty of  Paying Living Expenses: Not hard at all  Food Insecurity: No Food Insecurity (09/23/2023)   [facility] from [facility]   Hunger Vital Sign    Worried About Running Out of Food in the Last Year: Never true    Ran Out of Food in the Last Year: Never true  Transportation Needs: No Transportation Needs (09/23/2023)   Received from [facility] - Transportation    Lack of Transportation (Medical): No    Lack of Transportation (Non-Medical): No  Physical Activity: Unknown (09/23/2023)   Received from [facility]   Exercise Vital Sign    Days of Exercise per Week: 0 days    Minutes of Exercise per Session: Not on file  Stress: No Stress Concern Present (09/23/2023)   Received from [facility] - Occupational Stress Questionnaire    Feeling of Stress : Not at all  Social Connections: Moderately Integrated (09/23/2023)   Received from [facility]   Social Network    How would you rate your social network (family, work, friends)?: Adequate participation with social networks  Intimate Partner Violence: Not At Risk (09/23/2023)   Received from [facility]   HITS    Over the last 12 months how often did your partner physically hurt you?: Never    Over the last 12 months how often did your partner insult you or talk down to you?: Never    Over the last 12 months how often did your partner threaten you with physical harm?: Never    Over the last 12 months how often did your partner scream or curse at you?: Never   No current facility-administered medications on file prior to encounter.   Current Outpatient Medications on File Prior to Encounter  Medication Sig Dispense Refill   ondansetron (ZOFRAN) 4 MG tablet Take 1 tablet (4 mg total) by mouth every 6 (six) hours. 12 tablet 0   albuterol (VENTOLIN HFA) 108 (90 Base) MCG/ACT inhaler Inhale 1 puff into the lungs every 6 (six) hours as needed (prn).     busPIRone (BUSPAR) 7.5 MG tablet  Take 1 tablet (7.5 mg total) by mouth 2 (two) times daily. (Patient not taking: Reported on 11/13/2023) 60 tablet 1   Doxylamine-Pyridoxine 10-10 MG TBEC Take 2 tablets by mouth at bedtime. (Patient not taking: Reported on 11/13/2023) 60 tablet 0   valACYclovir (VALTREX) 1000 MG tablet Take 1 tablet (1,000 mg total) by mouth daily for 5 days. (Patient not taking: Reported on 11/13/2023) 5 tablet 0   Allergies  Allergen Reactions   Azithromycin Diarrhea    Diarrhea, nausea/vomiting, IV burns vein Diarrhea, nausea/vomiting, IV burns vein    I have reviewed patient's Past Medical Hx, Surgical Hx, Family Hx, Social Hx, medications and allergies.   ROS:  Review of Systems  Constitutional:  Negative for chills and fever.  Gastrointestinal:  Positive for abdominal pain and vomiting. Negative for diarrhea.   Review of Systems  Other systems negative   Physical Exam  Physical Exam Patient Vitals for the past 24 hrs:  BP Temp Temp src Pulse Resp SpO2 Height Weight  11/16/23 2041 112/64 98.2 F (36.8 C) Oral 93 16 100 % 5\' 3"  (1.6 m) 65.9 kg   Constitutional: Well-developed, well-nourished female in no acute distress.  Cardiovascular: normal rate Respiratory: normal effort GI: Abd soft, non-tender.  MS: Extremities nontender, no edema, normal ROM Neurologic: Alert and oriented x 4.  GU: Neg CVAT.  LAB RESULTS Results for orders placed or performed during the hospital encounter of 11/16/23 (from the past 24 hour(s))  Urinalysis, Routine w reflex microscopic -Urine, Clean Catch     Status: Abnormal   Collection Time: 11/16/23  9:15 PM  Result Value Ref Range   Color, Urine AMBER (A) YELLOW   APPearance HAZY (A) CLEAR   Specific Gravity, Urine 1.035 (H) 1.005 - 1.030   pH 5.0 5.0 - 8.0   Glucose, UA NEGATIVE NEGATIVE mg/dL   Hgb urine dipstick NEGATIVE NEGATIVE   Bilirubin Urine NEGATIVE NEGATIVE   Ketones, ur 80 (A) NEGATIVE mg/dL   Protein, ur 696 (A) NEGATIVE mg/dL   Nitrite  NEGATIVE NEGATIVE   Leukocytes,Ua NEGATIVE NEGATIVE   RBC / HPF 0-5 0 - 5 RBC/hpf   WBC, UA 0-5 0 - 5 WBC/hpf   Bacteria, UA RARE (A) NONE SEEN   Squamous Epithelial / HPF 11-20 0 - 5 /HPF   Mucus PRESENT   Comprehensive metabolic panel     Status: Abnormal   Collection Time: 11/16/23 10:23 PM  Result Value Ref Range   Sodium 136 135 - 145 mmol/L   Potassium 3.3 (L) 3.5 - 5.1 mmol/L   Chloride 103 98 - 111 mmol/L   CO2 22 22 - 32 mmol/L   Glucose, Bld 75 70 - 99 mg/dL   BUN 8 6 - 20 mg/dL   Creatinine, Ser 2.95 0.44 - 1.00 mg/dL   Calcium 9.1 8.9 - 28.4 mg/dL   Total Protein 7.3 6.5 - 8.1 g/dL   Albumin 4.3 3.5 - 5.0 g/dL   AST 20 15 - 41 U/L   ALT 16 0 - 44 U/L   Alkaline Phosphatase 35 (L) 38 - 126 U/L   Total Bilirubin 2.2 (H) <1.2 mg/dL   GFR, Estimated >13 >24 mL/min   Anion gap 11 5 - 15  CBC     Status: None   Collection Time: 11/16/23 10:23 PM  Result Value Ref Range   WBC 10.2 4.0 - 10.5 K/uL   RBC 4.77 3.87 - 5.11 MIL/uL   Hemoglobin 14.3 12.0 - 15.0 g/dL   HCT 40.1 02.7 - 25.3 %   MCV 87.0 80.0 - 100.0 fL   MCH 30.0 26.0 - 34.0 pg   MCHC 34.5 30.0 - 36.0 g/dL   RDW 66.4 40.3 - 47.4 %   Platelets 222 150 - 400 K/uL   nRBC 0.0 0.0 - 0.2 %     --/--/A POS (11/20 1924)  IMAGING   MAU Management/MDM: I have reviewed the triage vital signs and the nursing notes.   Pertinent labs & imaging results that were available during my care of the patient were reviewed by me and considered in my medical decision making (see chart for details).      I have reviewed her medical records  including past results, notes and treatments. Medical, Surgical, and family history were reviewed.  Medications and recent lab tests were reviewed  Ordered labs to evaluate liver and electrolyte status.  Normal values.. No elevation in transaminases.  Bilirubin has been chronically slightly elevated. .  Treatments in MAU included Maalox to relieve epigastric pain, IV hydration, Pepcid for  reflux. Phenergan for nausea   ASSESSMENT Single IUP at [redacted]w[redacted]d Epigastric Pain GERD Nausea and vomting Dehydration  PLAN Discharge home Rx Maalox for heartburn Rx Zofran ODT (not able to keep swallowable pill down) Rx Protonix for acid Rx Phenergan suppository for nausea  Pt stable at time of discharge. Encouraged to return here if she develops worsening of symptoms, increase in pain, fever, or other concerning symptoms.    Wynelle Bourgeois CNM, MSN Certified Nurse-Midwife 11/16/2023  9:19 PM

## 2023-11-17 ENCOUNTER — Other Ambulatory Visit: Payer: Self-pay | Admitting: Obstetrics and Gynecology

## 2023-11-17 DIAGNOSIS — Z3A01 Less than 8 weeks gestation of pregnancy: Secondary | ICD-10-CM | POA: Diagnosis not present

## 2023-11-17 DIAGNOSIS — B9689 Other specified bacterial agents as the cause of diseases classified elsewhere: Secondary | ICD-10-CM

## 2023-11-17 DIAGNOSIS — E860 Dehydration: Secondary | ICD-10-CM

## 2023-11-17 DIAGNOSIS — R112 Nausea with vomiting, unspecified: Secondary | ICD-10-CM | POA: Diagnosis not present

## 2023-11-17 DIAGNOSIS — R1013 Epigastric pain: Secondary | ICD-10-CM | POA: Diagnosis not present

## 2023-11-17 DIAGNOSIS — K219 Gastro-esophageal reflux disease without esophagitis: Secondary | ICD-10-CM | POA: Diagnosis not present

## 2023-11-17 MED ORDER — ALUMINUM-MAGNESIUM-SIMETHICONE 200-200-20 MG/5ML PO SUSP: 30.0000 mL | Freq: Three times a day (TID) | ORAL | 1 refills | Status: DC | PRN

## 2023-11-17 MED ORDER — PANTOPRAZOLE SODIUM 20 MG PO TBEC: 20.0000 mg | DELAYED_RELEASE_TABLET | Freq: Every day | ORAL | 1 refills | Status: DC

## 2023-11-17 MED ORDER — PROMETHAZINE HCL 25 MG RE SUPP: 25.0000 mg | Freq: Four times a day (QID) | RECTAL | 0 refills | Status: DC | PRN

## 2023-11-17 MED ORDER — DEXTROSE IN LACTATED RINGERS 5 % IV SOLN: Freq: Once | INTRAVENOUS | Status: AC

## 2023-11-17 MED ORDER — INFUVITE ADULT IV SOLN
Freq: Once | INTRAVENOUS | Status: DC
  Filled 2023-11-17: qty 10

## 2023-11-17 MED ORDER — ONDANSETRON 4 MG PO TBDP: 4.0000 mg | ORAL_TABLET | Freq: Four times a day (QID) | ORAL | 1 refills | Status: DC | PRN

## 2023-11-17 MED ORDER — DEXTROSE IN LACTATED RINGERS 5 % IV SOLN: Freq: Once | INTRAVENOUS | Status: DC

## 2023-11-17 MED ORDER — METRONIDAZOLE 0.75 % VA GEL: 1.0000 | Freq: Every day | VAGINAL | 5 refills | Status: DC

## 2023-11-19 ENCOUNTER — Other Ambulatory Visit: Payer: Self-pay | Admitting: Surgery

## 2023-11-19 ENCOUNTER — Ambulatory Visit: Payer: Medicaid Other

## 2023-11-19 ENCOUNTER — Other Ambulatory Visit: Payer: Self-pay

## 2023-11-19 DIAGNOSIS — K7689 Other specified diseases of liver: Secondary | ICD-10-CM | POA: Diagnosis not present

## 2023-11-19 DIAGNOSIS — Z3491 Encounter for supervision of normal pregnancy, unspecified, first trimester: Secondary | ICD-10-CM | POA: Diagnosis not present

## 2023-11-19 DIAGNOSIS — D1803 Hemangioma of intra-abdominal structures: Secondary | ICD-10-CM | POA: Diagnosis not present

## 2023-11-19 DIAGNOSIS — Z3A01 Less than 8 weeks gestation of pregnancy: Secondary | ICD-10-CM | POA: Diagnosis not present

## 2023-11-19 DIAGNOSIS — Z3200 Encounter for pregnancy test, result unknown: Secondary | ICD-10-CM

## 2023-11-25 ENCOUNTER — Inpatient Hospital Stay (HOSPITAL_COMMUNITY)
Admission: AD | Admit: 2023-11-25 | Discharge: 2023-11-26 | Disposition: A | Discharge status: Home / Self Care | Payer: Medicaid Other | Attending: Obstetrics and Gynecology | Admitting: Obstetrics and Gynecology

## 2023-11-25 ENCOUNTER — Encounter (HOSPITAL_COMMUNITY): Payer: Self-pay | Admitting: Obstetrics and Gynecology

## 2023-11-25 DIAGNOSIS — R55 Syncope and collapse: Secondary | ICD-10-CM | POA: Insufficient documentation

## 2023-11-25 DIAGNOSIS — O26891 Other specified pregnancy related conditions, first trimester: Secondary | ICD-10-CM | POA: Diagnosis not present

## 2023-11-25 DIAGNOSIS — R1110 Vomiting, unspecified: Secondary | ICD-10-CM | POA: Diagnosis present

## 2023-11-25 DIAGNOSIS — O210 Mild hyperemesis gravidarum: Secondary | ICD-10-CM | POA: Diagnosis not present

## 2023-11-25 DIAGNOSIS — Z3A01 Less than 8 weeks gestation of pregnancy: Secondary | ICD-10-CM | POA: Diagnosis not present

## 2023-11-25 DIAGNOSIS — R1030 Lower abdominal pain, unspecified: Secondary | ICD-10-CM | POA: Diagnosis not present

## 2023-11-25 DIAGNOSIS — R634 Abnormal weight loss: Secondary | ICD-10-CM | POA: Insufficient documentation

## 2023-11-25 LAB — URINALYSIS, ROUTINE W REFLEX MICROSCOPIC
Bacteria, UA: NONE SEEN
Bilirubin Urine: NEGATIVE
Glucose, UA: NEGATIVE mg/dL
Hgb urine dipstick: NEGATIVE
Ketones, ur: 80 mg/dL — AB
Leukocytes,Ua: NEGATIVE
Nitrite: NEGATIVE
Protein, ur: 30 mg/dL — AB
Specific Gravity, Urine: 1.034 — ABNORMAL HIGH (ref 1.005–1.030)
pH: 5 (ref 5.0–8.0)

## 2023-11-25 LAB — BASIC METABOLIC PANEL
Anion gap: 10 (ref 5–15)
BUN: 8 mg/dL (ref 6–20)
CO2: 22 mmol/L (ref 22–32)
Calcium: 9.2 mg/dL (ref 8.9–10.3)
Chloride: 105 mmol/L (ref 98–111)
Creatinine, Ser: 0.66 mg/dL (ref 0.44–1.00)
GFR, Estimated: >60 mL/min (ref >60)
Glucose, Bld: 83 mg/dL (ref 70–99)
Potassium: 3.5 mmol/L (ref 3.5–5.1)
Sodium: 137 mmol/L (ref 135–145)

## 2023-11-25 MED ORDER — KCL IN DEXTROSE-NACL 20-5-0.45 MEQ/L-%-% IV SOLN
Freq: Once | INTRAVENOUS | Status: AC
  Filled 2023-11-25: qty 1000

## 2023-11-25 MED ORDER — ONDANSETRON HCL 4 MG/2ML IJ SOLN
4.0000 mg | Freq: Once | INTRAMUSCULAR | Status: AC
  Administered 2023-11-25: 4 mg via INTRAVENOUS
  Filled 2023-11-25: qty 2

## 2023-11-25 MED ORDER — SCOPOLAMINE 1 MG/3DAYS TD PT72
1.0000 | MEDICATED_PATCH | Freq: Once | TRANSDERMAL | Status: DC
  Administered 2023-11-25: 1.5 mg via TRANSDERMAL
  Filled 2023-11-25: qty 1

## 2023-11-25 MED ORDER — DEXTROSE-SODIUM CHLORIDE 5-0.45 % IV SOLN: Freq: Once | INTRAVENOUS | Status: DC

## 2023-11-25 NOTE — MAU Provider Note (Signed)
Chief Complaint: Emesis During Pregnancy and Constipation   Event Date/Time   First Provider Initiated Contact with Patient 11/25/23 2212        SUBJECTIVE HPI: Angela Branch is a 27 y.o. [id number] at [redacted]w[redacted]d by LMP who presents to maternity admissions reporting persistent, recurrent nausea and vomiting.  Has been constipated so was hesitant to use Phenergan suppository. Has lost 2 more Kg.. She denies vaginal bleeding, vaginal itching/burning, urinary symptoms, h/a, dizziness, n/v, or fever/chills.    Constipation This is a recurrent problem. Associated symptoms include vomiting. Pertinent negatives include no abdominal pain, back pain, diarrhea or fever. She has tried nothing for the symptoms.  Emesis  This is a recurrent problem. Pertinent negatives include no abdominal pain, chills, diarrhea or fever. Treatments tried: zofran.   RN Note: Angela Branch is a 27 y.o. at [redacted]w[redacted]d here in MAU reporting n/v for days. Zofran is not helping and making me constipated. No BM in days. I passed out today and woke up with my children around me screaming. Loosing wt.  Having lower abd cramping. 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   Needs genetic counseling   Eczema 10/04/2014   Faintness 10/05/2015   Family dynamics problem 03/25/2013   Fetal tachycardia affecting management of mother 09/21/2018   GBS (group B Streptococcus carrier), +RV culture, currently pregnant 11/14/2015   GERD (gastroesophageal reflux disease) 09/21/2015   H/O scabies 11/14/2015   Treated with permetherin    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   Loss of weight 03/25/2013   Low back pain 09/20/2013   Menorrhagia 09/28/2013   Migraine headache 02/23/2014   Sleep apnea 04/14/2014   Substance abuse (HCC) 03/24/2015   + cannabis    Supervision of  low-risk pregnancy 06/28/2019    Nursing Staff Provider Office Location  [facility] Dating   early u/s Language  English Anatomy US   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  Flu Vaccine   Genetic Screen  NIPS: low risk female      TDaP vaccine    Hgb A1C or  GTT  Third trimester  Rhogam  n/a   LAB RESULTS  Feeding Plan Breast/Bottle Blood Type A/Positive/-- (07/16 1206)  Contraception undecided Antibody Negative (07/16   Thyroid disease    Past Surgical History:  Procedure Laterality Date   TOOTH EXTRACTION     WRIST SURGERY  08/30/2020   Social History   Socioeconomic History   Marital status: Single    Spouse name: Not on file   Number of children: 4   Years of education: 9th   Highest education level: Associate degree: academic program  Occupational History    Employer: UNEMPLOYED  Tobacco Use   Smoking status: Never   Smokeless tobacco: Never  Vaping Use   Vaping status: Never Used  Substance and Sexual Activity   Alcohol use: No    Alcohol/week: 0.0 standard drinks of alcohol   Drug use: No   Sexual activity: Not Currently    Birth control/protection: None  Other Topics Concern   Not on file  Social History Narrative   Patient is currently pregnant with her 5th child   Social Determinants of Health   Financial Resource Strain: Low Risk  (09/23/2023)   Received  from [facility]   Overall Financial Resource Strain (CARDIA)    Difficulty of Paying Living Expenses: Not hard at all  Food Insecurity: No Food Insecurity (09/23/2023)   Received from [facility]   Hunger Vital Sign    Worried About Running Out of Food in the Last Year: Never true    Ran Out of Food in the Last Year: Never true  Transportation Needs: No Transportation Needs (09/23/2023)   Received from [facility] - Transportation    Lack of Transportation (Medical): No    Lack of Transportation (Non-Medical): No  Physical Activity: Unknown (09/23/2023)   Received from [facility]    Exercise Vital Sign    Days of Exercise per Week: 0 days    Minutes of Exercise per Session: Not on file  Stress: No Stress Concern Present (09/23/2023)   Received from [facility] of Occupational Health - Occupational Stress Questionnaire    Feeling of Stress : Not at all  Social Connections: Moderately Integrated (09/23/2023)   Received from [facility]   Social Network    How would you rate your social network (family, work, friends)?: Adequate participation with social networks  Intimate Partner Violence: Not At Risk (09/23/2023)   Received from [facility]   HITS    Over the last 12 months how often did your partner physically hurt you?: Never    Over the last 12 months how often did your partner insult you or talk down to you?: Never    Over the last 12 months how often did your partner threaten you with physical harm?: Never    Over the last 12 months how often did your partner scream or curse at you?: Never   No current facility-administered medications on file prior to encounter.   Current Outpatient Medications on File Prior to Encounter  Medication Sig Dispense Refill   Doxylamine-Pyridoxine 10-10 MG TBEC Take 2 tablets by mouth at bedtime. 60 tablet 0   ondansetron (ZOFRAN-ODT) 4 MG disintegrating tablet Take 1 tablet (4 mg total) by mouth every 6 (six) hours as needed for nausea. 30 tablet 1   pantoprazole (PROTONIX) 20 MG tablet Take 1 tablet (20 mg total) by mouth daily. 30 tablet 1   albuterol (VENTOLIN HFA) 108 (90 Base) MCG/ACT inhaler Inhale 1 puff into the lungs every 6 (six) hours as needed (prn).     aluminum-magnesium hydroxide-simethicone (MAALOX) 200-200-20 MG/5ML SUSP Take 30 mLs by mouth every 8 (eight) hours as needed (acid reflux). 355 mL 1   busPIRone (BUSPAR) 7.5 MG tablet Take 1 tablet (7.5 mg total) by mouth 2 (two) times daily. (Patient not taking: Reported on 11/13/2023) 60 tablet 1   metroNIDAZOLE (METROGEL) 0.75 % vaginal gel  Place 1 Applicatorful vaginally at bedtime. Apply one applicatorful to vagina at bedtime for 10 days, then twice a week for 6 months. 70 g 5   ondansetron (ZOFRAN) 4 MG tablet Take 1 tablet (4 mg total) by mouth every 6 (six) hours. 12 tablet 0   promethazine (PHENERGAN) 25 MG suppository Place 1 suppository (25 mg total) rectally every 6 (six) hours as needed for nausea or vomiting. 20 each 0   Allergies  Allergen Reactions   Azithromycin Diarrhea    Diarrhea, nausea/vomiting, IV burns vein Diarrhea, nausea/vomiting, IV burns vein    I have reviewed patient's Past Medical Hx, Surgical Hx, Family Hx, Social Hx, medications and allergies.   ROS:  Review of Systems  Constitutional:  Negative for chills and fever.  Gastrointestinal:  Positive for constipation and vomiting. Negative for abdominal pain and diarrhea.  Musculoskeletal:  Negative for back pain.   Review of Systems  Other systems negative   Physical Exam  Physical Exam Patient Vitals for the past 24 hrs:  BP Temp Pulse Resp SpO2 Height Weight  11/25/23 2137 112/63 (!) 97.5 F (36.4 C) 74 17 100 % 5\' 3"  (1.6 m) 63.9 kg   Constitutional: Well-developed, well-nourished female in no acute distress.  Cardiovascular: normal rate Respiratory: normal effort GI: Abd soft, non-tender.  MS: Extremities nontender, no edema, normal ROM Neurologic: Alert and oriented x 4.  GU: Neg CVAT.   LAB RESULTS Results for orders placed or performed during the hospital encounter of 11/25/23 (from the past 24 hour(s))  Urinalysis, Routine w reflex microscopic -Urine, Clean Catch     Status: Abnormal   Collection Time: 11/25/23 10:00 PM  Result Value Ref Range   Color, Urine AMBER (A) YELLOW   APPearance HAZY (A) CLEAR   Specific Gravity, Urine 1.034 (H) 1.005 - 1.030   pH 5.0 5.0 - 8.0   Glucose, UA NEGATIVE NEGATIVE mg/dL   Hgb urine dipstick NEGATIVE NEGATIVE   Bilirubin Urine NEGATIVE NEGATIVE   Ketones, ur 80 (A) NEGATIVE mg/dL    Protein, ur 30 (A) NEGATIVE mg/dL   Nitrite NEGATIVE NEGATIVE   Leukocytes,Ua NEGATIVE NEGATIVE   RBC / HPF 0-5 0 - 5 RBC/hpf   WBC, UA 0-5 0 - 5 WBC/hpf   Bacteria, UA NONE SEEN NONE SEEN   Squamous Epithelial / HPF 0-5 0 - 5 /HPF   Mucus PRESENT   Basic metabolic panel     Status: None   Collection Time: 11/25/23 10:24 PM  Result Value Ref Range   Sodium 137 135 - 145 mmol/L   Potassium 3.5 3.5 - 5.1 mmol/L   Chloride 105 98 - 111 mmol/L   CO2 22 22 - 32 mmol/L   Glucose, Bld 83 70 - 99 mg/dL   BUN 8 6 - 20 mg/dL   Creatinine, Ser 9.60 0.44 - 1.00 mg/dL   Calcium 9.2 8.9 - 45.4 mg/dL   GFR, Estimated >09 >81 mL/min   Anion gap 10 5 - 15     --/--/A POS (11/20 1924)  IMAGING   MAU Management/MDM: I have reviewed the triage vital signs and the nursing notes.   Pertinent labs & imaging results that were available during my care of the patient were reviewed by me and considered in my medical decision making (see chart for details).      I have reviewed her medical records including past results, notes and treatments. Medical, Surgical, and family history were reviewed.  Medications and recent lab tests were reviewed  Ordered IV fluids (D5.45NS with KCl), Zofran, and Scopolamine patch for nausea.  Enema ordered with good results. Marland Kitchen .Felt better after treatment and was able to tolerate PO intake   ASSESSMENT Single IUP at [redacted]w[redacted]d Hyperemesis Weight loss  PLAN Discharge home New Rx Scopolamine for nausea Has other meds at home May need to consider infusion center  Pt stable at time of discharge. Encouraged to return here if she develops worsening of symptoms, increase in pain, fever, or other concerning symptoms.    Wynelle Bourgeois CNM, MSN Certified Nurse-Midwife 11/25/2023  10:12 PM

## 2023-11-25 NOTE — MAU Note (Signed)
.  Angela Branch is a 27 y.o. at [redacted]w[redacted]d here in MAU reporting n/v for days. Zofran is not helping and making me constipated. No BM in days. I passed out today and woke up with my children around me screaming. Loosing wt.  Having lower abd cramping. No VB  Onset of complaint: days Pain score: 5 Vitals:   11/25/23 2137  BP: 112/63  Pulse: 74  Resp: 17  Temp: (!) 97.5 F (36.4 C)  SpO2: 100%     FHT:n/a Lab orders placed from triage:   U/a

## 2023-11-26 DIAGNOSIS — Z3A01 Less than 8 weeks gestation of pregnancy: Secondary | ICD-10-CM

## 2023-11-26 DIAGNOSIS — R1110 Vomiting, unspecified: Secondary | ICD-10-CM

## 2023-11-26 DIAGNOSIS — O210 Mild hyperemesis gravidarum: Secondary | ICD-10-CM

## 2023-11-26 DIAGNOSIS — R634 Abnormal weight loss: Secondary | ICD-10-CM

## 2023-11-26 HISTORY — DX: Vomiting, unspecified: R11.10

## 2023-11-26 MED ORDER — TRANSDERM-SCOP 1 MG/3DAYS TD PT72: 1.0000 | MEDICATED_PATCH | TRANSDERMAL | 12 refills | Status: DC | PRN

## 2023-12-02 ENCOUNTER — Other Ambulatory Visit: Payer: Self-pay | Admitting: Certified Nurse Midwife

## 2023-12-02 ENCOUNTER — Other Ambulatory Visit: Payer: Self-pay | Admitting: Lactation Services

## 2023-12-02 ENCOUNTER — Other Ambulatory Visit: Payer: Self-pay | Admitting: Medical

## 2023-12-02 ENCOUNTER — Telehealth: Payer: Self-pay | Admitting: *Deleted

## 2023-12-02 DIAGNOSIS — O210 Mild hyperemesis gravidarum: Secondary | ICD-10-CM

## 2023-12-02 MED ORDER — DOCUSATE SODIUM 100 MG PO CAPS: 100.0000 mg | ORAL_CAPSULE | Freq: Two times a day (BID) | ORAL | 0 refills | Status: DC

## 2023-12-02 MED ORDER — SODIUM CHLORIDE 0.9 % IV SOLN: 8.0000 mg | Freq: Once | INTRAVENOUS | Status: DC

## 2023-12-02 MED ORDER — LACTATED RINGERS IV SOLN: Freq: Once | INTRAVENOUS | Status: AC

## 2023-12-02 NOTE — Progress Notes (Signed)
Ordered Colace for constipation with pregnancy. Reviewed with patient that may not be covered by insurance. Gave her more options to try vis Mychart message.

## 2023-12-02 NOTE — Telephone Encounter (Signed)
Discussed with Lamont Snowball and orders given for 1 liter LR with 4 mg Zofran weekly x 3 . She will enter orders today, then I will call for appointments. Called patient and left message I am calling her to discuss making an appointment for follow up from hospital visit, please call office. ( I wanted to see if she agreed with plan, and if she is limited to certain days she could go to appointments). [name]

## 2023-12-02 NOTE — Telephone Encounter (Signed)
Notified by provider Lamont Snowball, CNM she is putting in orders to [facility] and they will call patient with appointment. Angela Branch called back and I informed her she should get a call probably later today to schedule appointment with [facility] and to call us tomorrow if she does not get a call. She voices understanding. [name]

## 2023-12-02 NOTE — Telephone Encounter (Signed)
-----   Message from Wynelle Bourgeois sent at 11/26/2023  1:09 AM EST ----- Regarding: needs infusion center for hyperemesis Have seen her multiple times for hyperemesis in MAU  Has lost a lot of weight  Can yall arrange for her to go to infusion center 1-2 times a week? I can't put in orders right now as I have a very full board by myself in MAU She just needs IV fluids x 1 liter with IV zofran  Thanks Wynelle Bourgeois CNM

## 2023-12-03 ENCOUNTER — Telehealth: Payer: Medicaid Other

## 2023-12-04 ENCOUNTER — Encounter (HOSPITAL_COMMUNITY): Payer: Self-pay

## 2023-12-04 ENCOUNTER — Encounter (HOSPITAL_COMMUNITY): Payer: Medicaid Other | Admitting: Physician Assistant

## 2023-12-05 ENCOUNTER — Other Ambulatory Visit: Payer: Self-pay | Admitting: General Practice

## 2023-12-05 ENCOUNTER — Ambulatory Visit: Payer: Medicaid Other | Attending: Obstetrics and Gynecology

## 2023-12-05 DIAGNOSIS — Z3480 Encounter for supervision of other normal pregnancy, unspecified trimester: Secondary | ICD-10-CM
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
## 2023-12-09 ENCOUNTER — Other Ambulatory Visit: Payer: Self-pay

## 2023-12-09 ENCOUNTER — Ambulatory Visit: Payer: Medicaid Other

## 2023-12-09 VITALS — Wt 143.6 lb

## 2023-12-09 DIAGNOSIS — O0990 Supervision of high risk pregnancy, unspecified, unspecified trimester: Secondary | ICD-10-CM | POA: Insufficient documentation

## 2023-12-09 DIAGNOSIS — Z3491 Encounter for supervision of normal pregnancy, unspecified, first trimester: Secondary | ICD-10-CM | POA: Diagnosis not present

## 2023-12-09 DIAGNOSIS — Z3490 Encounter for supervision of normal pregnancy, unspecified, unspecified trimester: Secondary | ICD-10-CM | POA: Insufficient documentation

## 2023-12-10 ENCOUNTER — Non-Acute Institutional Stay (HOSPITAL_COMMUNITY)
Admission: RE | Admit: 2023-12-10 | Discharge: 2023-12-10 | Disposition: A | Discharge status: Home / Self Care | Payer: Medicaid Other | Source: Ambulatory Visit | Attending: Internal Medicine | Admitting: Internal Medicine

## 2023-12-10 DIAGNOSIS — O210 Mild hyperemesis gravidarum: Secondary | ICD-10-CM | POA: Insufficient documentation

## 2023-12-10 DIAGNOSIS — Z3A00 Weeks of gestation of pregnancy not specified: Secondary | ICD-10-CM | POA: Insufficient documentation

## 2023-12-10 MED ORDER — LACTATED RINGERS IV BOLUS
1000.0000 mL | INTRAVENOUS | Status: DC
Start: 2023-12-10 — End: 2023-12-31
  Administered 2023-12-10: 1000 mL via INTRAVENOUS

## 2023-12-10 MED ORDER — ONDANSETRON HCL 4 MG/2ML IJ SOLN
4.0000 mg | INTRAMUSCULAR | Status: DC
Start: 2023-12-10 — End: 2023-12-31
  Administered 2023-12-10: 4 mg via INTRAVENOUS
  Filled 2023-12-10: qty 2

## 2023-12-10 NOTE — Progress Notes (Signed)
Patient received Lactated Ringers bolus #1/3 via PIV. Pre infusion IV Zofran was administered. Tolerated well, vitals stable, printed AVS discharge instructions given, verbalized understanding. Patient should return in 1 week for the second dose. Patient advised that she should schedule next appointment at front desk. Alert, oriented and ambulatory at the time of discharge.

## 2023-12-11 ENCOUNTER — Encounter (HOSPITAL_COMMUNITY): Payer: Medicaid Other

## 2023-12-11 ENCOUNTER — Ambulatory Visit (INDEPENDENT_AMBULATORY_CARE_PROVIDER_SITE_OTHER): Payer: Self-pay | Admitting: Certified Nurse Midwife

## 2023-12-11 DIAGNOSIS — Z91199 Patient's noncompliance with other medical treatment and regimen due to unspecified reason: Secondary | ICD-10-CM

## 2023-12-11 NOTE — Progress Notes (Signed)
Patient did not come to appointment. May reschedule.  Lamont Snowball, MSN, CNM, RNC-OB Certified Nurse Midwife, [facility] 12/11/2023 10:08 AM

## 2023-12-12 ENCOUNTER — Encounter: Payer: Self-pay | Admitting: *Deleted

## 2023-12-15 ENCOUNTER — Other Ambulatory Visit: Payer: Self-pay

## 2023-12-15 ENCOUNTER — Telehealth: Payer: Self-pay

## 2023-12-15 NOTE — Telephone Encounter (Signed)
 I left a voicemail asking the patient to call back to discuss test results.

## 2023-12-15 NOTE — Telephone Encounter (Signed)
Patient returned call. The Myriad NIPS result is low risk, consistent with a female fetus. This screening significantly reduces but does not eliminate the chance that the current pregnancy has Down syndrome (trisomy 63), trisomy 62, trisomy 54, and common sex chromosome conditions. We discussed that it is also only a screen for fetal sex. There are many genetic conditions that cannot be detected by cfDNA. Please see report for details.  Based on this result, we expect the current female fetus to have a 50% chance of being a carrier for DMD and a 50% chance to not be a carrier. Ultrasounds will further confirm fetal sex, in the event patient does not want diagnostic testing.  Sheppard Plumber, MS Genetic Counselor [facility] [phone]

## 2023-12-18 ENCOUNTER — Inpatient Hospital Stay: Admission: RE | Admit: 2023-12-18 | Payer: Medicaid Other | Source: Ambulatory Visit

## 2023-12-18 ENCOUNTER — Encounter (HOSPITAL_COMMUNITY): Payer: Medicaid Other

## 2023-12-19 ENCOUNTER — Inpatient Hospital Stay (HOSPITAL_COMMUNITY)
Admission: AD | Admit: 2023-12-19 | Discharge: 2023-12-20 | Disposition: A | Discharge status: Home / Self Care | Payer: Medicaid Other | Attending: Obstetrics & Gynecology | Admitting: Obstetrics & Gynecology

## 2023-12-19 DIAGNOSIS — B9689 Other specified bacterial agents as the cause of diseases classified elsewhere: Secondary | ICD-10-CM

## 2023-12-19 DIAGNOSIS — O210 Mild hyperemesis gravidarum: Secondary | ICD-10-CM | POA: Diagnosis not present

## 2023-12-19 DIAGNOSIS — N760 Acute vaginitis: Secondary | ICD-10-CM | POA: Diagnosis not present

## 2023-12-19 DIAGNOSIS — Z3A11 11 weeks gestation of pregnancy: Secondary | ICD-10-CM | POA: Diagnosis not present

## 2023-12-19 DIAGNOSIS — O26891 Other specified pregnancy related conditions, first trimester: Secondary | ICD-10-CM | POA: Diagnosis not present

## 2023-12-19 DIAGNOSIS — S060X0A Concussion without loss of consciousness, initial encounter: Secondary | ICD-10-CM | POA: Diagnosis not present

## 2023-12-19 DIAGNOSIS — O9A211 Injury, poisoning and certain other consequences of external causes complicating pregnancy, first trimester: Secondary | ICD-10-CM | POA: Insufficient documentation

## 2023-12-19 DIAGNOSIS — Z3491 Encounter for supervision of normal pregnancy, unspecified, first trimester: Secondary | ICD-10-CM

## 2023-12-19 DIAGNOSIS — O23591 Infection of other part of genital tract in pregnancy, first trimester: Secondary | ICD-10-CM | POA: Diagnosis not present
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
## 2023-12-20 DIAGNOSIS — N760 Acute vaginitis: Secondary | ICD-10-CM

## 2023-12-20 DIAGNOSIS — B9689 Other specified bacterial agents as the cause of diseases classified elsewhere: Secondary | ICD-10-CM | POA: Diagnosis not present

## 2023-12-20 DIAGNOSIS — S060X0A Concussion without loss of consciousness, initial encounter: Secondary | ICD-10-CM | POA: Diagnosis not present

## 2023-12-20 DIAGNOSIS — O210 Mild hyperemesis gravidarum: Secondary | ICD-10-CM

## 2023-12-20 DIAGNOSIS — Z3A11 11 weeks gestation of pregnancy: Secondary | ICD-10-CM | POA: Diagnosis not present

## 2023-12-20 LAB — URINALYSIS, ROUTINE W REFLEX MICROSCOPIC
Bacteria, UA: NONE SEEN
Bilirubin Urine: NEGATIVE
Glucose, UA: NEGATIVE mg/dL
Hgb urine dipstick: NEGATIVE
Ketones, ur: 20 mg/dL — AB
Leukocytes,Ua: NEGATIVE
Nitrite: NEGATIVE
Protein, ur: 30 mg/dL — AB
Specific Gravity, Urine: 1.035 — ABNORMAL HIGH (ref 1.005–1.030)
pH: 5 (ref 5.0–8.0)

## 2023-12-20 LAB — WET PREP, GENITAL
Sperm: NONE SEEN
Trich, Wet Prep: NONE SEEN
WBC, Wet Prep HPF POC: >=10 — AB (ref <10)
Yeast Wet Prep HPF POC: NONE SEEN

## 2023-12-20 MED ORDER — BUTALBITAL-APAP-CAFFEINE 50-325-40 MG PO TABS: 1.0000 | ORAL_TABLET | Freq: Four times a day (QID) | ORAL | 0 refills | Status: DC | PRN

## 2023-12-20 MED ORDER — ACETAMINOPHEN 500 MG PO TABS
1000.0000 mg | ORAL_TABLET | Freq: Once | ORAL | Status: AC
  Administered 2023-12-20: 1000 mg via ORAL
  Filled 2023-12-20: qty 2

## 2023-12-20 MED ORDER — ONDANSETRON HCL 4 MG/2ML IJ SOLN
4.0000 mg | Freq: Once | INTRAMUSCULAR | Status: AC
  Administered 2023-12-20: 4 mg via INTRAMUSCULAR
  Filled 2023-12-20: qty 2

## 2023-12-20 MED ORDER — DIPHENHYDRAMINE HCL 25 MG PO CAPS
25.0000 mg | ORAL_CAPSULE | Freq: Once | ORAL | Status: AC
  Administered 2023-12-20: 25 mg via ORAL
  Filled 2023-12-20: qty 1

## 2023-12-20 MED ORDER — DIPHENHYDRAMINE HCL 50 MG/ML IJ SOLN: 25.0000 mg | Freq: Once | INTRAMUSCULAR | Status: DC

## 2023-12-20 MED ORDER — METRONIDAZOLE 0.75 % VA GEL: 1.0000 | Freq: Every day | VAGINAL | 5 refills | Status: DC

## 2023-12-20 NOTE — Discharge Instructions (Signed)
Safe Medications in Pregnancy   Acne: Benzoyl Peroxide Salicylic Acid  Backache/Headache: Tylenol: 2 regular strength every 4 hours OR              2 Extra strength every 6 hours  Colds/Coughs/Allergies: Benadryl (alcohol free) 25 mg every 6 hours as needed Breath right strips Claritin Cepacol throat lozenges Chloraseptic throat spray Cold-Eeze- up to three times per day Cough drops, alcohol free Flonase (by prescription only) Guaifenesin Mucinex Robitussin DM (plain only, alcohol free) Saline nasal spray/drops Sudafed (pseudoephedrine) & Actifed ** use only after [redacted] weeks gestation and if you do not have high blood pressure Tylenol Vicks Vaporub Zinc lozenges Zyrtec   Constipation: Colace Ducolax suppositories Fleet enema Glycerin suppositories Metamucil Milk of magnesia Miralax Senokot Smooth move tea  Diarrhea: Kaopectate Imodium A-D  *NO pepto Bismol  Hemorrhoids: Anusol Anusol HC Preparation H Tucks  Indigestion: Tums Maalox Mylanta Zantac  Pepcid  Insomnia: Benadryl (alcohol free) 25mg  every 6 hours as needed Tylenol PM Unisom, no Gelcaps  Leg Cramps: Tums MagGel  Nausea/Vomiting:  Bonine Dramamine Emetrol Ginger extract Sea bands Meclizine  Nausea medication to take during pregnancy:  Unisom (doxylamine succinate 25 mg tablets) Take one tablet daily at bedtime. If symptoms are not adequately controlled, the dose can be increased to a maximum recommended dose of two tablets daily (1/2 tablet in the morning, 1/2 tablet mid-afternoon and one at bedtime). Vitamin B6 100mg  tablets. Take one tablet twice a day (up to 200 mg per day).  Skin Rashes: Aveeno products Benadryl cream or 25mg  every 6 hours as needed Calamine Lotion 1% cortisone cream  Yeast infection: Gyne-lotrimin 7 Monistat 7   **If taking multiple medications, please check labels to avoid duplicating the same active ingredients **take medication as directed on  the label ** Do not exceed 3000 mg of tylenol in 24 hours **Do not take medications that contain aspirin or ibuprofen

## 2023-12-20 NOTE — MAU Provider Note (Signed)
Chief Complaint: Fall  SUBJECTIVE HPI: Angela Branch is a 27 y.o. [phone] at [redacted]w[redacted]d by early ultrasound who presents to maternity admissions reporting HA, fall, N/V. Patient with known severe HG.  Patient notes falling early this morning. Often feels lightheaded when standing up. Not able to keep much down with her HG. Larey Seat and hit her head. Denies LOC, any bleeding from her head. Was able to go about the rest of her day. Headache still present; some light sensitivity. No weakness/difficulty with moving.  Notes Zofran is making her constipated. Hesitant to use suppository as she is constipated. Scopolamine unhelpful. Has missed infusion OP appointments.  Vaginal spotting this AM. Denies recent intercourse, urinary symptoms, change in discharge, cramping, LOF. Spotting has tapered off.  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  Flu Vaccine   Genetic Screen  NIPS: low risk female      TDaP vaccine    Hgb A1C or  GTT  Third trimester  Rhogam  n/a   LAB  RESULTS  Feeding Plan Breast/Bottle Blood Type A/Positive/-- (07/16 1206)  Contraception undecided Antibody Negative (07/16   Thyroid disease    Past Surgical History:  Procedure Laterality Date   TOOTH EXTRACTION     WRIST SURGERY  08/30/2020   Social History   Socioeconomic History   Marital status: Single    Spouse name: Not on file   Number of children: 4   Years of education: 9th   Highest education level: Associate degree: academic program  Occupational History    Employer: UNEMPLOYED  Tobacco Use   Smoking status: Never   Smokeless tobacco: Never  Vaping Use   Vaping status: Never Used  Substance and Sexual Activity   Alcohol use: No    Alcohol/week: 0.0 standard drinks of alcohol   Drug use: No   Sexual activity: Not Currently    Birth control/protection: None  Other Topics Concern   Not on file  Social History Narrative   Patient is currently pregnant with her 5th child   Social Drivers of Corporate investment banker Strain: Medium Risk (12/09/2023)   Overall Financial Resource Strain (CARDIA)    Difficulty of Paying Living Expenses: Somewhat hard  Food Insecurity: No Food Insecurity (12/09/2023)   Hunger Vital Sign    Worried About Running Out of Food in the Last Year: Never true    Ran Out of Food in the Last Year: Never true  Transportation Needs: No Transportation Needs (  12/09/2023)   PRAPARE - Administrator, Civil Service (Medical): No    Lack of Transportation (Non-Medical): No  Physical Activity: Unknown (12/09/2023)   Exercise Vital Sign    Days of Exercise per Week: Patient declined    Minutes of Exercise per Session: 60 min  Stress: No Stress Concern Present (12/09/2023)   [employer] of Occupational Health - Occupational Stress Questionnaire    Feeling of Stress : Not at all  Social Connections: Moderately Integrated (12/09/2023)   Social Connection and Isolation Panel [NHANES]    Frequency of Communication with Friends and  Family: Twice a week    Frequency of Social Gatherings with Friends and Family: Never    Attends Religious Services: More than 4 times per year    Active Member of [employer] or Organizations: No    Attends Engineer, structural: More than 4 times per year    Marital Status: Living with partner  Intimate Partner Violence: Not At Risk (09/23/2023)   Received from [facility]   HITS    Over the last 12 months how often did your partner physically hurt you?: Never    Over the last 12 months how often did your partner insult you or talk down to you?: Never    Over the last 12 months how often did your partner threaten you with physical harm?: Never    Over the last 12 months how often did your partner scream or curse at you?: Never   No current facility-administered medications on file prior to encounter.   Current Outpatient Medications on File Prior to Encounter  Medication Sig Dispense Refill   albuterol (VENTOLIN HFA) 108 (90 Base) MCG/ACT inhaler Inhale 1 puff into the lungs every 6 (six) hours as needed (prn).     aluminum-magnesium hydroxide-simethicone (MAALOX) 200-200-20 MG/5ML SUSP Take 30 mLs by mouth every 8 (eight) hours as needed (acid reflux). (Patient not taking: Reported on 12/09/2023) 355 mL 1   busPIRone (BUSPAR) 7.5 MG tablet Take 1 tablet (7.5 mg total) by mouth 2 (two) times daily. (Patient not taking: Reported on 12/09/2023) 60 tablet 1   docusate sodium (COLACE) 100 MG capsule Take 1 capsule (100 mg total) by mouth 2 (two) times daily. (Patient not taking: Reported on 12/09/2023) 100 capsule 0   Doxylamine-Pyridoxine 10-10 MG TBEC Take 2 tablets by mouth at bedtime. (Patient not taking: Reported on 12/09/2023) 60 tablet 0   ondansetron (ZOFRAN) 4 MG tablet Take 1 tablet (4 mg total) by mouth every 6 (six) hours. (Patient not taking: Reported on 12/09/2023) 12 tablet 0   ondansetron (ZOFRAN-ODT) 4 MG disintegrating tablet Take 1 tablet (4 mg total) by mouth every 6  (six) hours as needed for nausea. 30 tablet 1   pantoprazole (PROTONIX) 20 MG tablet Take 1 tablet (20 mg total) by mouth daily. (Patient not taking: Reported on 12/09/2023) 30 tablet 1   promethazine (PHENERGAN) 25 MG suppository Place 1 suppository (25 mg total) rectally every 6 (six) hours as needed for nausea or vomiting. (Patient not taking: Reported on 12/09/2023) 20 each 0   scopolamine (TRANSDERM-SCOP) 1 MG/3DAYS Place 1 patch (1.5 mg total) onto the skin every 3 (three) days as needed (nausea). (Patient not taking: Reported on 12/09/2023) 10 patch 12   Allergies  Allergen Reactions   Azithromycin Diarrhea    Diarrhea, nausea/vomiting, IV burns vein Diarrhea, nausea/vomiting, IV burns vein    ROS:  Pertinent positives/negatives listed above.  I have reviewed patient's Past  Medical Hx, Surgical Hx, Family Hx, Social Hx, medications and allergies.   Physical Exam  Patient Vitals for the past 24 hrs:  BP Temp Temp src Pulse Resp Height Weight  12/19/23 2336 107/69 97.9 F (36.6 C) Oral 88 16 5\' 3"  (1.6 m) 64.5 kg   Constitutional: Well-developed, well-nourished female. Appears fatigued. MMM. Some spitting Head: No bruising/bleeding/injury Cardiovascular: mildly tachycardic, no m/r/g Respiratory: normal effort GI: Abd soft, non-tender MS: Extremities nontender, no edema, normal ROM Neurologic: Alert and oriented x 4. No gross neurologic abnormalities. Logical and linear thought process. Ambulating on her own without difficulty  BSUS used to visualize IUP with FHT ~160s  LAB RESULTS Results for orders placed or performed during the hospital encounter of 12/19/23 (from the past 24 hours)  Urinalysis, Routine w reflex microscopic -Urine, Clean Catch     Status: Abnormal   Collection Time: 12/20/23  3:54 AM  Result Value Ref Range   Color, Urine YELLOW YELLOW   APPearance HAZY (A) CLEAR   Specific Gravity, Urine 1.035 (H) 1.005 - 1.030   pH 5.0 5.0 - 8.0   Glucose, UA  NEGATIVE NEGATIVE mg/dL   Hgb urine dipstick NEGATIVE NEGATIVE   Bilirubin Urine NEGATIVE NEGATIVE   Ketones, ur 20 (A) NEGATIVE mg/dL   Protein, ur 30 (A) NEGATIVE mg/dL   Nitrite NEGATIVE NEGATIVE   Leukocytes,Ua NEGATIVE NEGATIVE   RBC / HPF 0-5 0 - 5 RBC/hpf   WBC, UA 0-5 0 - 5 WBC/hpf   Bacteria, UA NONE SEEN NONE SEEN   Squamous Epithelial / HPF 6-10 0 - 5 /HPF   Mucus PRESENT   Wet prep, genital     Status: Abnormal   Collection Time: 12/20/23  4:45 AM   Specimen: PATH Cytology Cervicovaginal Ancillary Only  Result Value Ref Range   Yeast Wet Prep HPF POC NONE SEEN NONE SEEN   Trich, Wet Prep NONE SEEN NONE SEEN   Clue Cells Wet Prep HPF POC PRESENT (A) NONE SEEN   WBC, Wet Prep HPF POC >=10 (A) <10   Sperm NONE SEEN     --/--/A POS (11/20 1924)  IMAGING No results found.  MAU Management/MDM: Orders Placed This Encounter  Procedures   Wet prep, genital   Urinalysis, Routine w reflex microscopic -Urine, Clean Catch    Meds ordered this encounter  Medications   ondansetron (ZOFRAN) injection 4 mg   acetaminophen (TYLENOL) tablet 1,000 mg   DISCONTD: diphenhydrAMINE (BENADRYL) injection 25 mg   diphenhydrAMINE (BENADRYL) capsule 25 mg   metroNIDAZOLE (METROGEL) 0.75 % vaginal gel    Sig: Place 1 Applicatorful vaginally at bedtime. Apply one applicatorful to vagina at bedtime for 10 days, then twice a week for 6 months.    Dispense:  70 g    Refill:  5   butalbital-acetaminophen-caffeine (FIORICET) 50-325-40 MG tablet    Sig: Take 1-2 tablets by mouth every 6 (six) hours as needed for headache.    Dispense:  20 tablet    Refill:  0    #Fall: Consistent with orthostasis especially in setting of HG. No signs of cardiac issue, seizure, fracture, other injury. No neurologic abnormalities to suggest need for head imaging. Did discuss possibility for concussion and importance of rest in healing from that. Conservative measures discussed such as tylenol, fioricet for  headache, benadryl for sleep if needed, no screens.  #VB: Swabs positive for BV. Metrogel sent. No signs of miscarriage at this time. FHT visualized.  #HG: Long discussion with patient  regarding options for treatment and consideration of what she has tried. Encouraged her to use phenergan as this is safe to use even if she is constipated. It will hopefully let her eat/drink more and be able to take other medications. Encouraged to reschedule OP infusion. Discussed that there is options for infusions up to three times a week if this is needed.  ASSESSMENT 1. Hyperemesis arising during pregnancy   2. Concussion without loss of consciousness, initial encounter   3. Bacterial vaginosis   4. [redacted] weeks gestation of pregnancy   5. BV (bacterial vaginosis)     PLAN Discharge home with strict return precautions. Allergies as of 12/20/2023       Reactions   Azithromycin Diarrhea   Diarrhea, nausea/vomiting, IV burns vein Diarrhea, nausea/vomiting, IV burns vein        Medication List     STOP taking these medications    aluminum-magnesium hydroxide-simethicone 200-200-20 MG/5ML Susp Commonly known as: MAALOX   busPIRone 7.5 MG tablet Commonly known as: BUSPAR   docusate sodium 100 MG capsule Commonly known as: Colace   Doxylamine-Pyridoxine 10-10 MG Tbec   pantoprazole 20 MG tablet Commonly known as: PROTONIX   Transderm-Scop 1 MG/3DAYS Generic drug: scopolamine       TAKE these medications    butalbital-acetaminophen-caffeine 50-325-40 MG tablet Commonly known as: FIORICET Take 1-2 tablets by mouth every 6 (six) hours as needed for headache.   metroNIDAZOLE 0.75 % vaginal gel Commonly known as: METROGEL Place 1 Applicatorful vaginally at bedtime. Apply one applicatorful to vagina at bedtime for 10 days, then twice a week for 6 months.   ondansetron 4 MG disintegrating tablet Commonly known as: ZOFRAN-ODT Take 1 tablet (4 mg total) by mouth every 6 (six) hours as  needed for nausea.   ondansetron 4 MG tablet Commonly known as: ZOFRAN Take 1 tablet (4 mg total) by mouth every 6 (six) hours.   promethazine 25 MG suppository Commonly known as: PHENERGAN Place 1 suppository (25 mg total) rectally every 6 (six) hours as needed for nausea or vomiting.   Ventolin HFA 108 (90 Base) MCG/ACT inhaler Generic drug: albuterol Inhale 1 puff into the lungs every 6 (six) hours as needed (prn).         Wylene Simmer, MD OB Fellow 12/20/2023  5:54 AM

## 2023-12-22 LAB — GC/CHLAMYDIA PROBE AMP (~~LOC~~) NOT AT ARMC
Chlamydia: NEGATIVE
Comment: NEGATIVE
Comment: NORMAL
Neisseria Gonorrhea: NEGATIVE

## 2023-12-23 ENCOUNTER — Telehealth: Payer: Self-pay

## 2023-12-23 NOTE — Telephone Encounter (Signed)
 Copied from CRM [id number]. Topic: Appointments - Scheduling Inquiry for Clinic >> Dec 22, 2023  4:44 PM Delon DASEN wrote: Patient/patient representative is calling to schedule an appointment. Refer to attachments for appointment information. Need to schedule IV infusion. Please call patient [phone]

## 2023-12-24 NOTE — L&D Delivery Note (Signed)
 OB/GYN Faculty Practice Delivery Note  Angela Branch is a 28 y.o. [id number] s/p SVD at [redacted]w[redacted]d. She was admitted for PROM.   ROM: 135h 109m with clear fluid GBS Status: Negative Maximum Maternal Temperature: 97.8  Labor Progress: IOL started with Cook's catheter which was removed at 2.5cm and Pitocin  infusion started. Pt progressed to 6 for AROM of forebag and then progressed rapidly to involuntary pushing.  Delivery Date/Time: 05/28/24 at 1828 Delivery: Called to room and patient was complete and pushing. Head delivered LOA. No nuchal cord present. Shoulder and body delivered in usual fashion. Infant with spontaneous cry, placed on mother's abdomen, dried and stimulated. Cord clamped x 2 after 1-minute delay, and cut by CNM. Baby handed off to awaiting NICU team. Cord blood drawn and cord sample collected. Placenta delivered spontaneously, intact, with 3-vessel cord. Fundus firm with massage and Pitocin . Labia, perineum, vagina, and cervix inspected, no laceration found.   Placenta: intact, sent to pathology Complications: none Lacerations: none EBL: 77ml Analgesia: epidural placed/bolused but infusion stopped since pt laid back and had the baby.  Postpartum Planning [x]  transfer orders to MB [x]  discharge summary started & shared [x]  message to sent to schedule follow-up  [x]  lists updated  Infant: Girl  APGAR 8  2510g  Lemuel Quaker, CNM, IBCLC Certified Nurse Midwife, [employer] for [employer], [facility] 05/28/2024, 8:38 PM

## 2023-12-25 ENCOUNTER — Encounter: Payer: Medicaid Other | Admitting: Obstetrics and Gynecology

## 2023-12-30 ENCOUNTER — Encounter: Payer: Self-pay | Admitting: Obstetrics and Gynecology

## 2023-12-30 ENCOUNTER — Non-Acute Institutional Stay (HOSPITAL_COMMUNITY)
Admission: RE | Admit: 2023-12-30 | Discharge: 2023-12-30 | Disposition: A | Discharge status: Home / Self Care | Payer: Medicaid Other | Source: Ambulatory Visit | Attending: Internal Medicine | Admitting: Internal Medicine

## 2023-12-30 ENCOUNTER — Other Ambulatory Visit: Payer: Self-pay | Admitting: Advanced Practice Midwife

## 2023-12-30 DIAGNOSIS — Z3A00 Weeks of gestation of pregnancy not specified: Secondary | ICD-10-CM | POA: Diagnosis not present

## 2023-12-30 DIAGNOSIS — O210 Mild hyperemesis gravidarum: Secondary | ICD-10-CM | POA: Diagnosis not present

## 2023-12-30 MED ORDER — LACTATED RINGERS IV BOLUS
1000.0000 mL | Freq: Once | INTRAVENOUS | Status: AC
  Administered 2023-12-30: 1000 mL via INTRAVENOUS

## 2023-12-30 MED ORDER — ONDANSETRON HCL 4 MG/2ML IJ SOLN
4.0000 mg | Freq: Once | INTRAMUSCULAR | Status: AC
  Administered 2023-12-30: 4 mg via INTRAVENOUS
  Filled 2023-12-30: qty 2

## 2023-12-30 NOTE — Progress Notes (Signed)
 PATIENT CARE CENTER NOTE    Diagnosis: Hyperemesis affecting pregnancy, antepartum [O21.0]    Provider: Larwence Clarity, PA   Procedure: IV fluid bolus and anti-emetic    Note: Patient received 1 liter bolus of LR (dose # 2 of 3) via PIV. Zofran  4 mg IV push also given per order. Patient tolerated bolus and medication well. Vital signs remained stable. AVS offered but patient refused. Patient to come back in 1 week for final infusion and will schedule appointment at the front desk. Patient alert, oriented and ambulatory at discharge.

## 2023-12-31 ENCOUNTER — Other Ambulatory Visit: Payer: Self-pay

## 2023-12-31 MED ORDER — ONDANSETRON HCL 4 MG PO TABS: 4.0000 mg | ORAL_TABLET | Freq: Four times a day (QID) | ORAL | 0 refills | Status: DC

## 2024-01-01 ENCOUNTER — Encounter: Payer: Self-pay | Admitting: *Deleted

## 2024-01-01 NOTE — Telephone Encounter (Signed)
 I called Angela Branch to notify and left a message I will sent her a message with information. Mychart message sent.  Per chart review it appears she has 2 infusions left. [name]

## 2024-01-01 NOTE — Telephone Encounter (Signed)
-----   Message from Norleen LULLA Rover sent at 12/30/2023  5:01 PM EST ----- Regarding: RE: wants to continue weekly iv hyperemeisi infusions, you are seeing for new ob She can have one more infusion, but if she no-shows for the next initial prenatal visit, she does not get any more. ----- Message ----- From: Skip Rock CROME, RN Sent: 12/30/2023   4:53 PM EST To: Norleen Rover LULLA, MD Subject: wants to continue weekly iv hyperemeisi infu#  She has DNKA twice for new ob, wants zofran  rx and wants to continue, please advise Has iv infusion 1/ 14/25 , new ob with you 01/12/24 Rock

## 2024-01-06 ENCOUNTER — Non-Acute Institutional Stay (HOSPITAL_COMMUNITY)
Admission: RE | Admit: 2024-01-06 | Discharge: 2024-01-06 | Disposition: A | Discharge status: Home / Self Care | Payer: Medicaid Other | Source: Ambulatory Visit | Attending: Internal Medicine | Admitting: Internal Medicine

## 2024-01-06 DIAGNOSIS — O210 Mild hyperemesis gravidarum: Secondary | ICD-10-CM | POA: Insufficient documentation

## 2024-01-06 MED ORDER — LACTATED RINGERS IV BOLUS
1000.0000 mL | Freq: Once | INTRAVENOUS | Status: AC
  Administered 2024-01-06: 1000 mL via INTRAVENOUS

## 2024-01-06 MED ORDER — ONDANSETRON HCL 4 MG/2ML IJ SOLN
4.0000 mg | Freq: Once | INTRAMUSCULAR | Status: AC
  Administered 2024-01-06: 4 mg via INTRAVENOUS
  Filled 2024-01-06: qty 2

## 2024-01-06 NOTE — Progress Notes (Signed)
 PATIENT CARE CENTER NOTE       Diagnosis: Hyperemesis affecting pregnancy, antepartum [O21.0]      Provider: Larwence Clarity, PA     Procedure: IV fluid bolus and anti-emetic      Note: Patient received 1 liter bolus of LR (dose # 3 of 3) via PIV. Zofran  4 mg IV push also given per order. Patient tolerated bolus and medication well. Vital signs remained stable. AVS offered but patient refused. Patient alert, oriented and ambulatory at discharge.

## 2024-01-07 ENCOUNTER — Other Ambulatory Visit: Payer: Self-pay | Admitting: Family Medicine

## 2024-01-07 ENCOUNTER — Ambulatory Visit: Payer: Self-pay | Admitting: Family Medicine

## 2024-01-12 ENCOUNTER — Ambulatory Visit: Payer: Medicaid Other | Admitting: Family Medicine

## 2024-01-12 ENCOUNTER — Other Ambulatory Visit: Payer: Self-pay

## 2024-01-12 ENCOUNTER — Encounter: Payer: Self-pay | Admitting: Family Medicine

## 2024-01-12 VITALS — BP 106/70 | HR 81 | Wt 142.0 lb

## 2024-01-12 DIAGNOSIS — Z3A14 14 weeks gestation of pregnancy: Secondary | ICD-10-CM | POA: Diagnosis not present

## 2024-01-12 DIAGNOSIS — Z3491 Encounter for supervision of normal pregnancy, unspecified, first trimester: Secondary | ICD-10-CM | POA: Diagnosis not present

## 2024-01-12 DIAGNOSIS — B009 Herpesviral infection, unspecified: Secondary | ICD-10-CM

## 2024-01-12 DIAGNOSIS — O210 Mild hyperemesis gravidarum: Secondary | ICD-10-CM | POA: Diagnosis not present

## 2024-01-12 MED ORDER — ONDANSETRON HCL 4 MG PO TABS: 4.0000 mg | ORAL_TABLET | Freq: Four times a day (QID) | ORAL | 0 refills | Status: DC

## 2024-01-13 ENCOUNTER — Other Ambulatory Visit: Payer: Self-pay | Admitting: Surgery

## 2024-01-13 ENCOUNTER — Ambulatory Visit
Admission: RE | Admit: 2024-01-13 | Discharge: 2024-01-13 | Disposition: A | Discharge status: Home / Self Care | Payer: Medicaid Other | Source: Ambulatory Visit | Attending: Surgery | Admitting: Surgery

## 2024-01-13 DIAGNOSIS — D1803 Hemangioma of intra-abdominal structures: Secondary | ICD-10-CM | POA: Diagnosis not present

## 2024-01-13 DIAGNOSIS — K7689 Other specified diseases of liver: Secondary | ICD-10-CM

## 2024-01-13 LAB — CBC/D/PLT+RPR+RH+ABO+RUBIGG...
Antibody Screen: NEGATIVE
Basophils Absolute: 0 10*3/uL (ref 0.0–0.2)
Basos: 1 %
EOS (ABSOLUTE): 0.2 10*3/uL (ref 0.0–0.4)
Eos: 3 %
HCV Ab: NONREACTIVE
HIV Screen 4th Generation wRfx: NONREACTIVE
Hematocrit: 38.4 % (ref 34.0–46.6)
Hemoglobin: 12.9 g/dL (ref 11.1–15.9)
Hepatitis B Surface Ag: NEGATIVE
Immature Grans (Abs): 0.1 10*3/uL (ref 0.0–0.1)
Immature Granulocytes: 1 %
Lymphocytes Absolute: 2.2 10*3/uL (ref 0.7–3.1)
Lymphs: 29 %
MCH: 30.2 pg (ref 26.6–33.0)
MCHC: 33.6 g/dL (ref 31.5–35.7)
MCV: 90 fL (ref 79–97)
Monocytes Absolute: 0.5 10*3/uL (ref 0.1–0.9)
Monocytes: 6 %
Neutrophils Absolute: 4.5 10*3/uL (ref 1.4–7.0)
Neutrophils: 60 %
Platelets: 197 10*3/uL (ref 150–450)
RBC: 4.27 x10E6/uL (ref 3.77–5.28)
RDW: 13.1 % (ref 11.7–15.4)
RPR Ser Ql: NONREACTIVE
Rh Factor: POSITIVE
Rubella Antibodies, IGG: 1.13 {index} (ref >0.99)
WBC: 7.4 10*3/uL (ref 3.4–10.8)

## 2024-01-13 LAB — HEMOGLOBIN A1C
Est. average glucose Bld gHb Est-mCnc: 103 mg/dL
Hgb A1c MFr Bld: 5.2 % (ref 4.8–5.6)

## 2024-01-13 LAB — HCV INTERPRETATION

## 2024-01-14 ENCOUNTER — Ambulatory Visit (HOSPITAL_COMMUNITY)
Admission: RE | Admit: 2024-01-14 | Discharge: 2024-01-14 | Disposition: A | Discharge status: Home / Self Care | Payer: Medicaid Other | Source: Ambulatory Visit

## 2024-01-14 LAB — CULTURE, OB URINE

## 2024-01-14 LAB — URINE CULTURE, OB REFLEX

## 2024-01-14 NOTE — Progress Notes (Signed)
Angela Branch, female    DOB: 10-13-1996   MRN: [id number]   Brief patient profile:  72 yobf   never smoker with asthma as child  referred to pulmonary clinic 01/15/2024 by Eligha Bridegroom  for cp       Ant L Cp x 10 years,  occurs most days lasts up to 10- 15 min    then started noting palpitations and resting sob but none of these occur at the same time or ever wake her from sleep and inhalers don't seem to help.    History of Present Illness  01/15/2024  Pulmonary/ 1st office eval/Leisel Pinette  Chief Complaint  Patient presents with   Consult    SOB and palpitations. Hx of asthma.  Sx x several years.  Patient is [redacted] weeks pregnant  Dyspnea:  only with exertion   Cough: none  Sleep: on side bed is flat  SABA use: rarely uses        No obvious day to day or daytime pattern/variability or assoc excess/ purulent sputum or mucus plugs or hemoptysis or subjective wheeze or overt sinus or hb symptoms.    Also denies any obvious fluctuation of symptoms with weather or environmental changes or other aggravating or alleviating factors except as outlined above   No unusual exposure hx or h/o childhood pna  or knowledge of premature birth.  Current Allergies, Complete Past Medical History, Past Surgical History, Family History, and Social History were reviewed in [employer] record.  ROS  The following are not active complaints unless bolded Hoarseness, sore throat, dysphagia, dental problems, itching, sneezing,  nasal congestion or discharge of excess mucus or purulent secretions, ear ache,   fever, chills, sweats, unintended wt loss or wt gain, classically pleuritic or exertional cp,  orthopnea pnd or arm/hand swelling  or leg swelling, presyncope, palpitations, abdominal pain, anorexia, nausea, vomiting, diarrhea  or change in bowel habits or change in bladder habits, change in stools or change in urine, dysuria, hematuria,  rash, arthralgias, visual complaints,  headache, numbness, weakness or ataxia or problems with walking or coordination,  change in mood or  memory.             Outpatient Medications Prior to Visit  Medication Sig Dispense Refill   albuterol (VENTOLIN HFA) 108 (90 Base) MCG/ACT inhaler Inhale 1 puff into the lungs every 6 (six) hours as needed (prn).     butalbital-acetaminophen-caffeine (FIORICET) 50-325-40 MG tablet Take 1-2 tablets by mouth every 6 (six) hours as needed for headache. 20 tablet 0   ondansetron (ZOFRAN-ODT) 4 MG disintegrating tablet DISSOLVE 1 TABLET(4 MG) ON THE TONGUE EVERY 6 HOURS AS NEEDED FOR NAUSEA 30 tablet 1   ondansetron (ZOFRAN) 4 MG tablet Take 1 tablet (4 mg total) by mouth every 6 (six) hours. 20 tablet 0   metroNIDAZOLE (METROGEL) 0.75 % vaginal gel Place 1 Applicatorful vaginally at bedtime. Apply one applicatorful to vagina at bedtime for 10 days, then twice a week for 6 months. (Patient not taking: Reported on 01/15/2024) 70 g 5   promethazine (PHENERGAN) 25 MG suppository Place 1 suppository (25 mg total) rectally every 6 (six) hours as needed for nausea or vomiting. 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   Needs genetic counseling   Eczema 10/04/2014   Faintness 10/05/2015   Family dynamics problem 03/25/2013   Fetal tachycardia affecting management of mother 09/21/2018   GBS (group B Streptococcus carrier), +RV culture, currently pregnant 11/14/2015   GERD (gastroesophageal reflux disease) 09/21/2015   H/O scabies 11/14/2015   Treated with permetherin     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  Flu Vaccine   Genetic Screen  NIPS: low risk female      TDaP vaccine    Hgb A1C or  GTT  Third trimester  Rhogam  n/a   LAB RESULTS  Feeding Plan Breast/Bottle Blood Type A/Positive/-- (07/16 1206)  Contraception undecided Antibody Negative (07/16   Supervision of other normal pregnancy, antepartum 07/25/2021              Nursing Staff    Provider      Office Location     [facility]    Dating     Early (6wk) [name]      Language     English    Anatomy US     Suboptimal at 19w, rescan ordered      Flu Vaccine          Genetic/Carrier Screen     NIPS:   Low risks  AFP:     Horizon: negative       TDaP Vaccine      03/09/21    Hgb A1C or   GTT    Early A1C 5.0  Third trimester passed (79, 131, 102)      COVID Vacci   Thyroid disease    Vaginal delivery 01/17/2022      Objective:     BP (!) 100/56 (BP Location: Left Arm, Patient Position: Sitting, Cuff Size: Normal)   Pulse 76   Temp 98.7 F (37.1 C) (Oral)   Ht 5\' 3"  (1.6 m)   Wt 143 lb (64.9 kg)   LMP 07/09/2023 (Approximate) Comment: unsure of date  SpO2 99%   BMI 25.33 kg/m   SpO2: 99 % RA   Pleasant amb bf nad   HEENT : Oropharynx  nl      Nasal turbinates nl    NECK :  without  apparent JVD/ palpable Nodes/TM    LUNGS: no acc muscle use,  Nl contour chest which is clear to A and P bilaterally without cough on insp or exp maneuvers   CV:  RRR  no s3 or murmur or increase in P2, and no edema   ABD:   soft and nontender c/w 15 week IUP  MS:  Gait nl   ext warm without deformities Or obvious joint restrictions  calf tenderness, cyanosis or  clubbing    SKIN: warm and dry without lesions    NEURO:  alert, approp, nl sensorium with  no motor or cerebellar deficits apparent.       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 Frequently these patients have had multiple negative workups and now at 15 weeks IUP not need to start a w/u.  Treatment consists of avoiding foods that cause gas (especially boiled eggs, mexcican food but especially  beans and undercooked vegetables like  spinach and some salads)  and citrucel 1 heaping tsp twice daily with a large glass of water.  Pain should improve w/in 2 weeks.   Advised of effects of IUP on breathing to be expected and how to tell the differnce from asthma   F/u here can be prn while pregnant and after delivery if having the same problems happy to see again for additonal w/u if needed which would include at the very least a cxr.  Discussed in detail all the  indications, usual  risks and alternatives  relative to the benefits with patient who agrees to proceed with conservative rx  as outlined      Each maintenance medication was reviewed in detail including emphasizing most importantly the difference between maintenance and prns and under what circumstances the prns are to be triggered using an action plan format where appropriate.  Total time for H and P, chart review,  counseling, reviewing hfa  device(s) and generating customized AVS unique to this office visit / same day charting = 34 min for new pt eval of chronic refractory chest symptoms.              Sandrea Hughs, MD 01/15/2024
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
## 2024-01-15 ENCOUNTER — Ambulatory Visit (INDEPENDENT_AMBULATORY_CARE_PROVIDER_SITE_OTHER): Payer: Medicaid Other | Admitting: Internal Medicine

## 2024-01-15 ENCOUNTER — Encounter: Payer: Self-pay | Admitting: Internal Medicine

## 2024-01-15 VITALS — BP 100/56 | HR 76 | Temp 98.7°F | Ht 63.0 in | Wt 143.0 lb

## 2024-01-15 DIAGNOSIS — R0789 Other chest pain: Secondary | ICD-10-CM | POA: Diagnosis not present

## 2024-01-15 NOTE — Patient Instructions (Signed)
Classic subdiaphragmatic pain pattern suggests ibs:  Migratory very limited distribution of pain locations, daytime, not usually exacerbated by exercise  or coughing, worse in sitting position, frequently associated with generalized abd bloating, not as likely to be present supine due to the dome effect of the diaphragm which  is  canceled in that position. Frequently these patients have had multiple negative GI workups and CT scans.  Treatment consists of avoiding foods that cause gas (especially boiled eggs, mexcican food but especially  beans and undercooked vegetables like  spinach and some salads)  and citrucel 1 heaping tbsp twice daily with a large glass of water.  Pain should improve w/in 2 weeks and if not then consider further GI work up.       Return here after pregnancy to evaluate further if needed.

## 2024-01-17 DIAGNOSIS — R0789 Other chest pain: Secondary | ICD-10-CM | POA: Insufficient documentation

## 2024-01-17 DIAGNOSIS — K589 Irritable bowel syndrome without diarrhea: Secondary | ICD-10-CM | POA: Insufficient documentation

## 2024-01-17 NOTE — Assessment & Plan Note (Addendum)
Onset around 2015  - rec rx as IbS with citrucel/ diet 01/15/2024 >>>  Classic   pain pattern suggests ibs:  Stereotypical, migratory with a very limited distribution of pain locations (always anterior on L) , daytime, not usually exacerbated by exercise  or coughing, worse in sitting position, frequently associated with generalized abd bloating, not as likely to be present supine due to the dome effect of the diaphragm which  is  canceled in that position. Frequently these patients have had multiple negative workups and now at 15 weeks IUP not need to start a w/u.  Treatment consists of avoiding foods that cause gas (especially boiled eggs, mexcican food but especially  beans and undercooked vegetables like  spinach and some salads)  and citrucel 1 heaping tsp twice daily with a large glass of water.  Pain should improve w/in 2 weeks.   Advised of effects of IUP on breathing to be expected and how to tell the differnce from asthma   F/u here can be prn while pregnant and after delivery if having the same problems happy to see again for additonal w/u if needed which would include at the very least a cxr.  Discussed in detail all the  indications, usual  risks and alternatives  relative to the benefits with patient who agrees to proceed with conservative rx  as outlined      Each maintenance medication was reviewed in detail including emphasizing most importantly the difference between maintenance and prns and under what circumstances the prns are to be triggered using an action plan format where appropriate.  Total time for H and P, chart review, counseling, reviewing hfa  device(s) and generating customized AVS unique to this office visit / same day charting = 34 min for new pt eval of chronic refractory chest symptoms.

## 2024-01-19 ENCOUNTER — Non-Acute Institutional Stay (HOSPITAL_COMMUNITY): Admission: RE | Admit: 2024-01-19 | Payer: Medicaid Other | Source: Ambulatory Visit

## 2024-01-26 ENCOUNTER — Encounter: Payer: Self-pay | Admitting: *Deleted

## 2024-01-28 ENCOUNTER — Other Ambulatory Visit: Payer: Self-pay | Admitting: Pediatrics

## 2024-01-28 ENCOUNTER — Encounter: Payer: Self-pay | Admitting: Certified Nurse Midwife

## 2024-01-29 ENCOUNTER — Encounter (HOSPITAL_COMMUNITY): Payer: Self-pay | Admitting: Obstetrics & Gynecology

## 2024-01-29 ENCOUNTER — Inpatient Hospital Stay (HOSPITAL_COMMUNITY)
Admission: AD | Admit: 2024-01-29 | Discharge: 2024-01-29 | Disposition: A | Discharge status: Home / Self Care | Payer: Medicaid Other | Attending: Obstetrics & Gynecology | Admitting: Obstetrics & Gynecology

## 2024-01-29 ENCOUNTER — Telehealth: Payer: Self-pay | Admitting: Family Medicine

## 2024-01-29 DIAGNOSIS — R52 Pain, unspecified: Secondary | ICD-10-CM

## 2024-01-29 DIAGNOSIS — J101 Influenza due to other identified influenza virus with other respiratory manifestations: Secondary | ICD-10-CM | POA: Insufficient documentation

## 2024-01-29 DIAGNOSIS — R509 Fever, unspecified: Secondary | ICD-10-CM | POA: Diagnosis present

## 2024-01-29 DIAGNOSIS — R519 Headache, unspecified: Secondary | ICD-10-CM | POA: Diagnosis not present

## 2024-01-29 DIAGNOSIS — O26892 Other specified pregnancy related conditions, second trimester: Secondary | ICD-10-CM | POA: Diagnosis not present

## 2024-01-29 DIAGNOSIS — J029 Acute pharyngitis, unspecified: Secondary | ICD-10-CM | POA: Diagnosis present

## 2024-01-29 DIAGNOSIS — Z3A16 16 weeks gestation of pregnancy: Secondary | ICD-10-CM | POA: Insufficient documentation

## 2024-01-29 DIAGNOSIS — O99512 Diseases of the respiratory system complicating pregnancy, second trimester: Secondary | ICD-10-CM | POA: Insufficient documentation

## 2024-01-29 DIAGNOSIS — O98512 Other viral diseases complicating pregnancy, second trimester: Secondary | ICD-10-CM | POA: Insufficient documentation

## 2024-01-29 LAB — RESP PANEL BY RT-PCR (RSV, FLU A&B, COVID)  RVPGX2
Influenza A by PCR: POSITIVE — AB
Influenza B by PCR: NEGATIVE
Resp Syncytial Virus by PCR: NEGATIVE
SARS Coronavirus 2 by RT PCR: NEGATIVE

## 2024-01-29 MED ORDER — FLUTICASONE PROPIONATE 50 MCG/ACT NA SUSP: 2.0000 | Freq: Every day | NASAL | 2 refills | Status: DC

## 2024-01-29 MED ORDER — ONDANSETRON HCL 4 MG PO TABS
4.0000 mg | ORAL_TABLET | Freq: Once | ORAL | Status: AC
  Administered 2024-01-29: 4 mg via ORAL
  Filled 2024-01-29: qty 1

## 2024-01-29 MED ORDER — GUAIFENESIN ER 600 MG PO TB12: 600.0000 mg | ORAL_TABLET | Freq: Two times a day (BID) | ORAL | 0 refills | Status: DC | PRN

## 2024-01-29 MED ORDER — HYDROMORPHONE HCL 1 MG/ML IJ SOLN
1.0000 mg | Freq: Once | INTRAMUSCULAR | Status: AC
  Administered 2024-01-29: 1 mg via INTRAMUSCULAR
  Filled 2024-01-29: qty 1

## 2024-01-29 MED ORDER — OXYCODONE HCL 5 MG PO TABS
5.0000 mg | ORAL_TABLET | Freq: Once | ORAL | Status: AC
  Administered 2024-01-29: 5 mg via ORAL
  Filled 2024-01-29: qty 1

## 2024-01-29 MED ORDER — ACETAMINOPHEN-CAFFEINE 500-65 MG PO TABS
2.0000 | ORAL_TABLET | Freq: Once | ORAL | Status: DC
  Filled 2024-01-29: qty 2

## 2024-01-29 MED ORDER — PROMETHAZINE HCL 25 MG PO TABS
25.0000 mg | ORAL_TABLET | Freq: Once | ORAL | Status: AC
  Administered 2024-01-29: 25 mg via ORAL
  Filled 2024-01-29: qty 1

## 2024-01-29 MED ORDER — OSELTAMIVIR PHOSPHATE 75 MG PO CAPS: 75.0000 mg | ORAL_CAPSULE | Freq: Two times a day (BID) | ORAL | 0 refills | Status: DC

## 2024-01-29 MED ORDER — IBUPROFEN 600 MG PO TABS
600.0000 mg | ORAL_TABLET | Freq: Once | ORAL | Status: AC
  Administered 2024-01-29: 600 mg via ORAL
  Filled 2024-01-29: qty 1

## 2024-01-29 NOTE — MAU Note (Signed)
.  Angela Branch is a 28 y.o. at [redacted]w[redacted]d here in MAU reporting: 72 hours headache-generalized body aches - temp 100.7 before leaving the house. Also has not fetal movement x2 days and reports feeling movement since 14 weeks. Denies vaginal bleeding, discharge, loss of fluid , contractions. Intermittent light cramping. Denies dysuria  Onset of complaint:  Pain score: 8 headache             Lower back Vitals:   01/29/24 0358  BP: 106/61  Pulse: (!) 107  Resp: 16  Temp: 99.8 F (37.7 C)  SpO2: 100%     FHT: 167bpm  Lab orders placed from triage: see orders

## 2024-01-29 NOTE — Telephone Encounter (Signed)
 Patient called office yesterday afternoon, requesting to speak to me,   She has ask for me to send a message to the Provider so they can send her a Rx over for the Flu, she state she have 6 kids and she can't just go somewhere and sit to get a test done, she said she know she have the flu she just want a Rx called in.

## 2024-01-29 NOTE — MAU Provider Note (Addendum)
 Chief Complaint:  Fever   Event Date/Time   First Provider Initiated Contact with Patient 01/29/24 0407     HPI: Angela Branch is a 28 y.o. [id number] at 67w6dwho presents to maternity admissions reporting body aches, no fetal movement and mild cramping. Also has a headache.  Took Tylenol  without relief. . She denies LOF, vaginal bleeding, urinary symptoms,  diarrhea, constipation   Fever  This is a new problem. The current episode started in the past 7 days. The problem has been unchanged. Associated symptoms include abdominal pain, congestion, coughing, headaches, muscle aches, nausea and a sore throat. Pertinent negatives include no wheezing. She has tried acetaminophen  for the symptoms. The treatment provided no relief.  Risk factors comment:  Sick contacts  RN note: Angela Branch is a 28 y.o. at [redacted]w[redacted]d here in MAU reporting: 72 hours headache-generalized body aches - temp 100.7 before leaving the house. Also has not fetal movement x2 days and reports feeling movement since 14 weeks. Denies vaginal bleeding, discharge, loss of fluid , contractions. Intermittent light cramping. 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   Needs genetic counseling   Eczema 10/04/2014   Faintness 10/05/2015   Family dynamics problem 03/25/2013   Fetal tachycardia affecting management of mother 09/21/2018   GBS (group B Streptococcus carrier), +RV culture, currently pregnant 11/14/2015   GERD (gastroesophageal reflux disease) 09/21/2015   H/O scabies 11/14/2015   Treated with permetherin    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  Flu Vaccine   Genetic Screen  NIPS: low risk female      TDaP vaccine    Hgb A1C or  GTT  Third trimester  Rhogam  n/a   LAB RESULTS  Feeding Plan Breast/Bottle Blood Type A/Positive/-- (07/16 1206)  Contraception undecided Antibody Negative (07/16   Supervision of other normal pregnancy, antepartum 07/25/2021              Nursing Staff    Provider      Office Location     [facility]    Dating     Early (6wk) US       Language     English    Anatomy US      Suboptimal at 19w, rescan ordered      Flu Vaccine          Genetic/Carrier Screen     NIPS:   Low risks  AFP:     Horizon: negative       TDaP Vaccine      03/09/21    Hgb A1C or   GTT    Early A1C 5.0  Third trimester passed (79, 131, 102)      COVID Vacci   Thyroid   disease    Vaginal delivery 01/17/2022    Past obstetric history: OB History  Gravida Para Term Preterm AB Living  6 4 4  0 1 4  SAB IAB Ectopic Multiple Live Births  1 0 0 0 4    # Outcome Date GA Lbr Len/2nd Weight Sex Type Anes PTL Lv  6 Current           5 Term 01/17/22 [redacted]w[redacted]d 06:09 / 00:15 3555 g M Vag-Spont EPI  LIV     Birth Comments: facial bruising  4 Term 01/12/20 [redacted]w[redacted]d 02:14 / 00:12 3260 g M Vag-Spont EPI  LIV     Birth Comments: WNL  3 Term 09/22/18 [redacted]w[redacted]d 558:17 / 00:23 2900 g F Vag-Spont EPI  LIV  2 Term 12/01/15 [redacted]w[redacted]d 16:59 / 03:13 3245 g M Vag-Spont EPI  LIV     Birth Comments: Hgb, Normal, Fa Newborn Screen Barcode: [id number] Date collected: 12/02/2015  1 SAB 2015             Birth Comments: not seen in hospital, not sure how far she was    Past Surgical History: Past Surgical History:  Procedure Laterality Date   TOOTH EXTRACTION      WRIST SURGERY  08/30/2020    Family History: Family History  Problem Relation Age of Onset   Asthma Father    Asthma Sister    Healthy Sister    Healthy Sister    Healthy Brother    Healthy Brother    Diabetes Maternal Grandmother    Cancer - Colon Maternal Grandfather    Hyperlipidemia Paternal Grandfather    Heart disease Paternal Grandfather    Mental illness Neg Hx    Birth defects Neg Hx    Kidney disease Neg Hx    Hypertension Neg Hx     Social History: Social History   Tobacco Use   Smoking status: Never   Smokeless tobacco: Never  Vaping Use   Vaping status: Never Used  Substance Use Topics   Alcohol use: No    Alcohol/week: 0.0 standard drinks of alcohol   Drug use: No    Allergies:  Allergies  Allergen Reactions   Azithromycin  Diarrhea    Diarrhea, nausea/vomiting, IV burns vein Diarrhea, nausea/vomiting, IV burns vein    Meds:  Facility-Administered Medications Prior to Admission  Medication Dose Route Frequency Provider Last Rate Last Admin   ondansetron  (ZOFRAN ) 8 mg in sodium chloride  0.9 % 50 mL IVPB  8 mg Intravenous Once        Medications Prior to Admission  Medication Sig Dispense Refill Last Dose/Taking   acetaminophen  (TYLENOL ) 500 MG tablet Take 1,000 mg by mouth every 6 (six) hours as needed.   01/29/2024 at  2:30 AM   albuterol  (VENTOLIN  HFA) 108 (90 Base) MCG/ACT inhaler Inhale 1 puff into the lungs every 6 (six) hours as needed (prn).      butalbital -acetaminophen -caffeine  (FIORICET ) 50-325-40 MG tablet Take 1-2 tablets by mouth every 6 (six) hours as needed for headache. 20 tablet 0    ondansetron  (ZOFRAN ) 4 MG tablet Take 1 tablet (4 mg total) by mouth every 6 (six) hours. 20 tablet 0    ondansetron  (ZOFRAN -ODT) 4 MG disintegrating tablet DISSOLVE 1 TABLET(4 MG) ON THE TONGUE EVERY 6 HOURS AS NEEDED FOR NAUSEA 30 tablet 1     I have reviewed patient's Past Medical Hx, Surgical Hx, Family Hx, Social Hx, medications and allergies.    ROS:  Review of Systems  Constitutional:  Positive for fever.  HENT:  Positive for congestion and sore throat.   Respiratory:  Positive for cough. Negative for wheezing.   Gastrointestinal:  Positive for abdominal pain and nausea.  Neurological:  Positive for headaches.   Other systems negative  Physical Exam  Patient Vitals for the past 24 hrs:  BP Temp Temp src Pulse Resp SpO2 Height  01/29/24 0358 106/61 99.8 F (37.7 C) Oral (!) 107 16 100 % 5' 3 (1.6 m)   Vitals:   01/29/24 0358 01/29/24 0612 01/29/24 0615  BP: 106/61  (!) 110/53  Pulse: (!) 107  96  Resp: 16  17  Temp: 99.8 F (37.7 C) (!) 100.4 F (38 C)   TempSrc: Oral Oral   SpO2: 100%  100%  Height: 5' 3 (1.6 m)      Constitutional: Well-developed, well-nourished female in no acute distress.  Cardiovascular: normal rate and rhythm Respiratory: normal effort, clear to auscultation bilaterally GI: Abd soft, non-tender, gravid appropriate for gestational age.   No rebound or guarding. MS: Extremities nontender, no edema, normal ROM Neurologic: Alert and oriented x 4.  GU: Neg CVAT.   FHT:  167   Labs: Results for orders placed or performed during the hospital encounter of 01/29/24 (from the past 24 hours)  Resp panel by RT-PCR (RSV, Flu A&B, Covid) Anterior Nasal Swab     Status: Abnormal   Collection Time: 01/29/24  3:53 AM   Specimen: Anterior Nasal Swab  Result Value Ref Range   SARS Coronavirus 2 by RT PCR NEGATIVE NEGATIVE   Influenza A by PCR POSITIVE (A) NEGATIVE   Influenza B by PCR NEGATIVE NEGATIVE   Resp Syncytial Virus by PCR NEGATIVE NEGATIVE    A/Positive/-- (01/20 1610)  Imaging:  Informal bedside US  to reassure patient Fetus very active  MAU Course/MDM: I have reviewed the triage vital signs and the nursing notes.   Pertinent labs & imaging results that were available during my care of the patient were reviewed by me and considered in my medical decision making (see chart for  details).      I have reviewed her medical records including past results, notes and treatments.   I have ordered labs and reviewed results. Positive for Influenza A. NST reviewed  Treatments in MAU included Ibuprofen  for body aches and headache without much relief.  We gave Phenergan  and Oxy IR for same with mod relief.    Assessment: Single IUP at [redacted]w[redacted]d Body aches Influenza A Headache  Plan: Discharge home Rx Tamiflu  for Flu Rx Flonase  and Mucinex  Supportive care Follow up in Office for prenatal visits and recheck Encouraged to return if she develops worsening of symptoms, increase in pain, fever, or other concerning symptoms.   Pt stable at time of discharge.  Earnie Pouch CNM, MSN Certified Nurse-Midwife 01/29/2024 4:07 AM

## 2024-01-29 NOTE — MAU Note (Signed)
 Pt aware of + flu A result-sandwich tray provided per pt request-tolerating liquids po

## 2024-01-29 NOTE — Telephone Encounter (Signed)
 Per chart review, patient was sent in MAU earlier this morning & received Rx for tamiflu .

## 2024-01-31 ENCOUNTER — Other Ambulatory Visit: Payer: Self-pay | Admitting: Family Medicine

## 2024-01-31 DIAGNOSIS — Z3491 Encounter for supervision of normal pregnancy, unspecified, first trimester: Secondary | ICD-10-CM

## 2024-02-02 ENCOUNTER — Other Ambulatory Visit: Payer: Self-pay | Admitting: *Deleted

## 2024-02-02 ENCOUNTER — Encounter: Payer: Self-pay | Admitting: Obstetrics and Gynecology

## 2024-02-02 DIAGNOSIS — Z3491 Encounter for supervision of normal pregnancy, unspecified, first trimester: Secondary | ICD-10-CM

## 2024-02-02 DIAGNOSIS — O219 Vomiting of pregnancy, unspecified: Secondary | ICD-10-CM

## 2024-02-02 MED ORDER — ONDANSETRON HCL 4 MG PO TABS
4.0000 mg | ORAL_TABLET | Freq: Four times a day (QID) | ORAL | 1 refills | Status: DC
Start: 2024-02-02 — End: 2024-05-30

## 2024-02-09 NOTE — Progress Notes (Unsigned)
   PRENATAL VISIT NOTE  Subjective:  Angela Branch is a 28 y.o. [phone] at [redacted]w[redacted]d being seen today for ongoing prenatal care.  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  Patient reports nausea and vomiting.  Contractions: Irritability. Vag. Bleeding: None.  Movement: Present. Denies leaking of fluid.   The following portions of the patient's history were reviewed and updated as appropriate: allergies, current medications, past family history, past medical history, past social history, past surgical history and problem list.   Objective:   Vitals:   02/10/24 1157  BP: 106/63  Pulse: 80  Weight: 138 lb 9.6 oz (62.9 kg)    Fetal Status: Fetal Heart Rate (bpm): 150   Movement: Present     General:  Alert, oriented and cooperative. Patient is in no acute distress.  Skin: Skin is warm and dry. No rash noted.   Cardiovascular: Normal heart rate noted  Respiratory: Normal respiratory effort, no problems with respiration noted  Abdomen: Soft, gravid, appropriate for gestational age.  Pain/Pressure: Absent     Pelvic: Cervical exam deferred        Extremities: Normal range of motion.  Edema: None  Mental Status: Normal mood and affect. Normal behavior. Normal judgment and thought content.   Assessment and Plan:  Pregnancy: [id number] at [redacted]w[redacted]d 1. Encounter for supervision of low-risk pregnancy in second trimester (Primary) - Patient feeling fetal movement, states it is becoming more regular.   2. [redacted]  weeks gestation of pregnancy - Routine prenatal care.   3. 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 Early screening completed.   4. Hyperemesis arising during pregnancy - Discussion of options. Patient does not want scopolamine patches or Diclegis.  - promethazine (PHENERGAN) 25 MG tablet; Take 1 tablet (25 mg total) by mouth at bedtime.  Dispense: 30 tablet; Refill: 6 - metoCLOPramide (REGLAN) 10 MG tablet; Take 1 tablet (10 mg total) by mouth 4 (four) times daily.  Dispense: 30 tablet; Refill: 5 - glycopyrrolate (ROBINUL) 1 MG tablet; Take 1 tablet (1 mg total) by mouth 3 (three) times daily.  Dispense: 90 tablet; Refill: 5  5. Abdominal cramping - Discussed pattern and possibility of fetal activity vs. Dehydration vs. Infection. Discussed when to present to MAU.  - Cervicovaginal ancillary only( [place])  6. Herpes simplex type 2 (HSV-2) infection affecting pregnancy, antepartum - Prophylaxis at 36 weeks.   Preterm labor symptoms and general obstetric precautions including but not limited to vaginal bleeding, contractions, leaking of fluid and fetal movement were reviewed in detail with the patient. Please refer to After Visit Summary for other counseling recommendations.   Return in about 4 weeks (around 03/09/2024) for LOB.  Future Appointments  Date Time Provider Department Center  02/13/2024 10:15 AM [facility] NURSE [facility]  02/13/2024 10:30 AM [facility]  03/19/2024 11:15 AM Cresenzo, Cyndi Lennert, MD [facility]    Richardson Landry, CNM

## 2024-02-10 ENCOUNTER — Other Ambulatory Visit (HOSPITAL_COMMUNITY)
Admission: RE | Admit: 2024-02-10 | Discharge: 2024-02-10 | Disposition: A | Discharge status: Home / Self Care | Payer: Medicaid Other | Source: Ambulatory Visit | Attending: Certified Nurse Midwife | Admitting: Certified Nurse Midwife

## 2024-02-10 ENCOUNTER — Ambulatory Visit: Payer: Medicaid Other | Admitting: Certified Nurse Midwife

## 2024-02-10 ENCOUNTER — Other Ambulatory Visit: Payer: Self-pay

## 2024-02-10 VITALS — BP 106/63 | HR 80 | Wt 138.6 lb

## 2024-02-10 DIAGNOSIS — O98512 Other viral diseases complicating pregnancy, second trimester: Secondary | ICD-10-CM

## 2024-02-10 DIAGNOSIS — B009 Herpesviral infection, unspecified: Secondary | ICD-10-CM

## 2024-02-10 DIAGNOSIS — R160 Hepatomegaly, not elsewhere classified: Secondary | ICD-10-CM

## 2024-02-10 DIAGNOSIS — B9689 Other specified bacterial agents as the cause of diseases classified elsewhere: Secondary | ICD-10-CM

## 2024-02-10 DIAGNOSIS — Z148 Genetic carrier of other disease: Secondary | ICD-10-CM | POA: Diagnosis not present

## 2024-02-10 DIAGNOSIS — R109 Unspecified abdominal pain: Secondary | ICD-10-CM | POA: Insufficient documentation

## 2024-02-10 DIAGNOSIS — O210 Mild hyperemesis gravidarum: Secondary | ICD-10-CM

## 2024-02-10 DIAGNOSIS — Z3492 Encounter for supervision of normal pregnancy, unspecified, second trimester: Secondary | ICD-10-CM

## 2024-02-10 DIAGNOSIS — N760 Acute vaginitis: Secondary | ICD-10-CM | POA: Diagnosis not present

## 2024-02-10 DIAGNOSIS — Z3A18 18 weeks gestation of pregnancy: Secondary | ICD-10-CM | POA: Diagnosis not present

## 2024-02-10 MED ORDER — GLYCOPYRROLATE 1 MG PO TABS: 1.0000 mg | ORAL_TABLET | Freq: Three times a day (TID) | ORAL | 5 refills | Status: DC

## 2024-02-10 MED ORDER — METOCLOPRAMIDE HCL 10 MG PO TABS
10.0000 mg | ORAL_TABLET | Freq: Four times a day (QID) | ORAL | 5 refills | Status: DC
Start: 2024-02-10 — End: 2024-05-30

## 2024-02-10 MED ORDER — PROMETHAZINE HCL 25 MG PO TABS
25.0000 mg | ORAL_TABLET | Freq: Every day | ORAL | 6 refills | Status: DC
Start: 2024-02-10 — End: 2024-05-30

## 2024-02-12 ENCOUNTER — Encounter: Payer: Self-pay | Admitting: Certified Nurse Midwife

## 2024-02-12 LAB — CERVICOVAGINAL ANCILLARY ONLY
Bacterial Vaginitis (gardnerella): POSITIVE — AB
Candida Glabrata: NEGATIVE
Candida Vaginitis: NEGATIVE
Chlamydia: NEGATIVE
Comment: NEGATIVE
Comment: NEGATIVE
Comment: NEGATIVE
Comment: NEGATIVE
Comment: NEGATIVE
Comment: NORMAL
Neisseria Gonorrhea: NEGATIVE
Trichomonas: NEGATIVE

## 2024-02-12 MED ORDER — METRONIDAZOLE 0.75 % VA GEL: 1.0000 | Freq: Every day | VAGINAL | 1 refills | Status: DC

## 2024-02-12 NOTE — Addendum Note (Signed)
 Addended by: Lamont Snowball A on: 02/12/2024 08:35 PM   Modules accepted: Orders

## 2024-02-13 ENCOUNTER — Ambulatory Visit: Payer: Medicaid Other | Attending: Certified Nurse Midwife

## 2024-02-13 ENCOUNTER — Other Ambulatory Visit: Payer: Self-pay | Admitting: *Deleted

## 2024-02-13 ENCOUNTER — Ambulatory Visit: Payer: Medicaid Other

## 2024-02-13 ENCOUNTER — Ambulatory Visit (HOSPITAL_BASED_OUTPATIENT_CLINIC_OR_DEPARTMENT_OTHER): Payer: Medicaid Other

## 2024-02-13 DIAGNOSIS — Z362 Encounter for other antenatal screening follow-up: Secondary | ICD-10-CM

## 2024-02-13 DIAGNOSIS — Z3A19 19 weeks gestation of pregnancy: Secondary | ICD-10-CM

## 2024-02-13 DIAGNOSIS — Z3491 Encounter for supervision of normal pregnancy, unspecified, first trimester: Secondary | ICD-10-CM

## 2024-02-13 DIAGNOSIS — Z363 Encounter for antenatal screening for malformations: Secondary | ICD-10-CM | POA: Insufficient documentation

## 2024-02-13 DIAGNOSIS — J4520 Mild intermittent asthma, uncomplicated: Secondary | ICD-10-CM

## 2024-02-13 DIAGNOSIS — O98512 Other viral diseases complicating pregnancy, second trimester: Secondary | ICD-10-CM | POA: Diagnosis not present

## 2024-02-13 DIAGNOSIS — O2692 Pregnancy related conditions, unspecified, second trimester: Secondary | ICD-10-CM | POA: Diagnosis not present

## 2024-02-13 DIAGNOSIS — R160 Hepatomegaly, not elsewhere classified: Secondary | ICD-10-CM | POA: Insufficient documentation

## 2024-02-13 DIAGNOSIS — K589 Irritable bowel syndrome without diarrhea: Secondary | ICD-10-CM | POA: Diagnosis not present

## 2024-02-13 DIAGNOSIS — J45909 Unspecified asthma, uncomplicated: Secondary | ICD-10-CM | POA: Diagnosis not present

## 2024-02-13 DIAGNOSIS — Z148 Genetic carrier of other disease: Secondary | ICD-10-CM

## 2024-02-13 DIAGNOSIS — O321XX0 Maternal care for breech presentation, not applicable or unspecified: Secondary | ICD-10-CM | POA: Insufficient documentation

## 2024-02-13 DIAGNOSIS — O99612 Diseases of the digestive system complicating pregnancy, second trimester: Secondary | ICD-10-CM | POA: Diagnosis not present

## 2024-02-13 DIAGNOSIS — B009 Herpesviral infection, unspecified: Secondary | ICD-10-CM | POA: Insufficient documentation

## 2024-02-13 DIAGNOSIS — O99512 Diseases of the respiratory system complicating pregnancy, second trimester: Secondary | ICD-10-CM | POA: Diagnosis not present

## 2024-02-13 NOTE — Progress Notes (Signed)
 Patient information  Patient Name: Angela Branch  Patient MRN:   [id number]  Referring practice: MFM Referring Provider: [facility])  MFM CONSULT  Angela Branch is a 28 y.o. [id number] at [redacted]w[redacted]d here for ultrasound and consultation. 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    RE liver mass: The patient had a workup for liver and a mass and was seen at [facility].  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  I discussed the concerns in pregnancy including growth and/or rupture.  She knows to monitor her symptoms.  It was diagnosed when she had right upper quadrant pain in the right upper quadrant ultrasound showed a 3.7-year lesion on the left liver lobe.  It was of indeterminate origin but was in a similar location to a hemangioma that was previously noted on CT scan 2022 that was done after a trauma workup.  She has chronic hyperbilirubinemia but no other abnormal lab values.  She was seen in consultation with GI at [facility] and was instructed that this lesion is very unlikely to rupture given its size and location.  An MRI was recommended to further characterize the lesion.  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  RE carrier for Deschenes's muscular dystrophy: She has 2 previous affected children.  She  understands this genetic condition very well and does not desire further counseling.  She knows the risks of this pregnancy as well.  RE asthma: Mostly as a child.  Rare albuterol use.  Discussed the need to monitor during pregnancy.  Sonographic findings Single intrauterine pregnancy at 19w 0d. Fetal cardiac activity:  Observed and appears normal. Presentation: Breech. The anatomic structures that were well seen appear normal without evidence of soft markers. The anatomic survey is complete except for suboptimal views of the spine. Fetal biometry shows the estimated fetal weight at the 75 percentile. Amniotic fluid: Within normal limits.  MVP:  cm. Placenta: Anterior. Adnexa: No abnormality visualized. Cervical length: 3.8 cm.  There are limitations of prenatal ultrasound such as the inability to detect certain abnormalities due to poor visualization. Various factors such as fetal position, gestational age and maternal body habitus may increase the difficulty in visualizing the fetal anatomy.    Recommendations -EDD is Estimated Date of Delivery: 07/09/24. -Detailed ultrasound was done today without abnormalities. -Follow-up anatomy and fetal growth in 4 to 6 weeks -MRI for liver assessment per GI.  The patient will monitor for pain which could indicate growth or rupture. -Continue routine prenatal care with referring OB provider  Review of Systems: A review of systems was performed and was negative except per HPI   Vitals and Physical Exam    02/13/2024   11:03 AM 02/10/2024   11:57 AM 01/29/2024    7:14 AM  Vitals with BMI  Weight  138 lbs  10 oz   BMI  24.56   Systolic 108 106 811  Diastolic 58 63 62  Pulse  80 121  Sitting comfortably on the sonogram table Nonlabored breathing Normal rate and rhythm Abdomen is nontender  Past pregnancies OB History  Gravida Para Term Preterm AB Living  6 4 4  0 1 4  SAB IAB Ectopic Multiple Live Births  1 0 0 0 4    # Outcome Date GA  Lbr Len/2nd Weight Sex Type Anes PTL Lv  6 Current           5 Term 01/17/22 [redacted]w[redacted]d 06:09 / 00:15 7 lb 13.4 oz (3.555 kg) M Vag-Spont EPI  LIV     Birth Comments: facial bruising  4 Term 01/12/20 [redacted]w[redacted]d 02:14 / 00:12 7 lb 3 oz (3.26 kg) M Vag-Spont EPI  LIV     Birth Comments: WNL  3 Term 09/22/18 [redacted]w[redacted]d 558:17 / 00:23 6 lb 6.3 oz (2.9 kg) F Vag-Spont EPI  LIV  2 Term 12/01/15 [redacted]w[redacted]d 16:59 / 03:13 7 lb 2.5 oz (3.245 kg) M Vag-Spont EPI  LIV     Birth Comments: Hgb, Normal, Fa Newborn Screen Barcode: [id number] Date collected: 12/02/2015  1 SAB 2015             Birth Comments: not seen in hospital, not sure how far she was     I spent 45 minutes reviewing the patients chart, including labs and images as well as counseling the patient about her medical conditions. Greater than 50% of the time was spent in direct face-to-face patient counseling.  Angela Branch  MFM, [facility]   02/13/2024  1:43 PM

## 2024-03-08 ENCOUNTER — Inpatient Hospital Stay (HOSPITAL_COMMUNITY)
Admission: AD | Admit: 2024-03-08 | Discharge: 2024-03-08 | Disposition: A | Discharge status: Home / Self Care | Attending: Obstetrics and Gynecology | Admitting: Obstetrics and Gynecology

## 2024-03-08 ENCOUNTER — Encounter (HOSPITAL_COMMUNITY): Payer: Self-pay | Admitting: Obstetrics and Gynecology

## 2024-03-08 ENCOUNTER — Other Ambulatory Visit: Payer: Self-pay

## 2024-03-08 DIAGNOSIS — O26899 Other specified pregnancy related conditions, unspecified trimester: Secondary | ICD-10-CM

## 2024-03-08 DIAGNOSIS — O26892 Other specified pregnancy related conditions, second trimester: Secondary | ICD-10-CM | POA: Diagnosis not present

## 2024-03-08 DIAGNOSIS — R109 Unspecified abdominal pain: Secondary | ICD-10-CM | POA: Diagnosis not present

## 2024-03-08 DIAGNOSIS — Z3A22 22 weeks gestation of pregnancy: Secondary | ICD-10-CM | POA: Insufficient documentation

## 2024-03-08 LAB — URINALYSIS, ROUTINE W REFLEX MICROSCOPIC
Bilirubin Urine: NEGATIVE
Glucose, UA: NEGATIVE mg/dL
Hgb urine dipstick: NEGATIVE
Ketones, ur: NEGATIVE mg/dL
Leukocytes,Ua: NEGATIVE
Nitrite: NEGATIVE
Protein, ur: NEGATIVE mg/dL
Specific Gravity, Urine: 1.025 (ref 1.005–1.030)
pH: 6 (ref 5.0–8.0)

## 2024-03-08 LAB — WET PREP, GENITAL
Clue Cells Wet Prep HPF POC: NONE SEEN
Sperm: NONE SEEN
Trich, Wet Prep: NONE SEEN
WBC, Wet Prep HPF POC: >=10 — AB (ref <10)
Yeast Wet Prep HPF POC: NONE SEEN

## 2024-03-08 NOTE — MAU Provider Note (Signed)
 History     CSN: [id number]  Arrival date and time: 03/08/24 4098   Event Date/Time   First Provider Initiated Contact with Patient 03/08/24 1014      Chief Complaint  Patient presents with   Contractions   Back Pain   Vaginal Pain   HPI Angela Branch is a 28 y.o. [id number] at [redacted]w[redacted]d who presents for contractions. Reports contractions for the last few months. Worsened last night. Feels like they occur every 10 minutes. Has continued to vomit due to HEG. Denies fever/chills, dysuria, LOF, vaginal bleeding, dysuria, or vaginal discharge. Positive fetal movement.   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   Hyperemesis 11/26/2023   Weight loss from 68kg (11/21) to 63.8kg (12/3)     IUD (intrauterine device) in place 07/03/2023   Cu-IUD inserted 05/2023     Loss of weight 03/25/2013   Low back pain 09/20/2013   Menorrhagia 09/28/2013   Migraine headache 02/23/2014   Sleep apnea 04/14/2014   Substance abuse (HCC) 03/24/2015   + cannabis    Thyroid disease     Past Surgical History:  Procedure Laterality Date   TOOTH EXTRACTION     WRIST SURGERY  08/30/2020    Family History  Problem Relation Age of Onset   Asthma Father    Asthma Sister    Healthy Sister    Healthy Sister    Healthy Brother    Healthy Brother    Diabetes Maternal Grandmother    Cancer - Colon Maternal Grandfather    Hyperlipidemia Paternal Grandfather    Heart  disease Paternal Grandfather    Mental illness Neg Hx    Birth defects Neg Hx    Kidney disease Neg Hx    Hypertension Neg Hx     Social History   Tobacco Use   Smoking status: Never   Smokeless tobacco: Never  Vaping Use   Vaping status: Never Used  Substance Use Topics   Alcohol use: No    Alcohol/week: 0.0 standard drinks of alcohol   Drug use: No    Allergies:  Allergies  Allergen Reactions   Azithromycin Diarrhea    Diarrhea, nausea/vomiting, IV burns vein Diarrhea, nausea/vomiting, IV burns vein    No medications prior to admission.    Review of Systems  All other systems reviewed and are negative.  Physical Exam   Blood pressure (!) 95/54, pulse 83, temperature 98.5 F (36.9 C), temperature source Oral, resp. rate 17, height 5\' 3"  (1.6 m), weight 66.4 kg, last menstrual period 07/09/2023, SpO2 99%, currently breastfeeding.  Physical Exam Vitals and nursing note reviewed. Exam conducted with a chaperone present.  Constitutional:      General:  She is not in acute distress.    Appearance: Normal appearance. She is well-developed. She is not ill-appearing.  HENT:     Head: Normocephalic and atraumatic.  Eyes:     General: No scleral icterus.       Right eye: No discharge.        Left eye: No discharge.     Conjunctiva/sclera: Conjunctivae normal.     Pupils: Pupils are equal, round, and reactive to light.  Pulmonary:     Effort: Pulmonary effort is normal. No respiratory distress.  Abdominal:     Tenderness: There is no abdominal tenderness.     Comments: gravid  Genitourinary:    Comments: Pelvic: NEFG, physiologic discharge, no blood. Cervix visually closed.  SVE: Dilation: Closed Effacement (%): Thick Cervical Position: Posterior Station: -3 Exam by:: Judeth Horn NP  Skin:    General: Skin is warm and dry.  Neurological:     General: No focal deficit present.     Mental Status: She is alert.  Psychiatric:        Mood and Affect: Mood  normal.        Behavior: Behavior normal.    MAU Course  Procedures Results for orders placed or performed during the hospital encounter of 03/08/24 (from the past 24 hours)  Urinalysis, Routine w reflex microscopic -Urine, Clean Catch     Status: None   Collection Time: 03/08/24 10:00 AM  Result Value Ref Range   Color, Urine YELLOW YELLOW   APPearance CLEAR CLEAR   Specific Gravity, Urine 1.025 1.005 - 1.030   pH 6.0 5.0 - 8.0   Glucose, UA NEGATIVE NEGATIVE mg/dL   Hgb urine dipstick NEGATIVE NEGATIVE   Bilirubin Urine NEGATIVE NEGATIVE   Ketones, ur NEGATIVE NEGATIVE mg/dL   Protein, ur NEGATIVE NEGATIVE mg/dL   Nitrite NEGATIVE NEGATIVE   Leukocytes,Ua NEGATIVE NEGATIVE  Wet prep, genital     Status: Abnormal   Collection Time: 03/08/24 10:34 AM   Specimen: PATH Cytology Cervicovaginal Ancillary Only  Result Value Ref Range   Yeast Wet Prep HPF POC NONE SEEN NONE SEEN   Trich, Wet Prep NONE SEEN NONE SEEN   Clue Cells Wet Prep HPF POC NONE SEEN NONE SEEN   WBC, Wet Prep HPF POC >=10 (A) <10   Sperm NONE SEEN     MDM   Assessment and Plan   1. Abdominal pain affecting pregnancy   2. [redacted] weeks gestation of pregnancy    -No reg ctx on TOCO. Abdomen soft/non tender. Cervix closed/thick/firm -Wet prep & u/a negative. GC/CT pending -FHT present via doppler -Reviewed PTL precautions & reasons to return to MAU  Judeth Horn 03/08/2024, 2:03 PM

## 2024-03-08 NOTE — MAU Note (Signed)
 Angela Branch is a 28 y.o. at [redacted]w[redacted]d here in MAU reporting: she's been having ctxs for past 2 months but last night they have been more consistent.  Also c/o lower back pain and vaginal pain.  States took Tylenol extra strength, 2 tabs at 0830.  Denies VB or LOF.  Endorses +FM.  LMP: NA Onset of complaint: yesterday Pain score: 5 ctxs, 6 back, 5 vaginal Vitals:   03/08/24 0947  BP: (!) 95/54  Pulse: 83  Resp: 17  Temp: 98.5 F (36.9 C)  SpO2: 99%     FHT: 150 bpm  Lab orders placed from triage: UA

## 2024-03-09 ENCOUNTER — Encounter: Payer: Medicaid Other | Admitting: Certified Nurse Midwife

## 2024-03-09 LAB — GC/CHLAMYDIA PROBE AMP (~~LOC~~) NOT AT ARMC
Chlamydia: NEGATIVE
Comment: NEGATIVE
Comment: NORMAL
Neisseria Gonorrhea: NEGATIVE

## 2024-03-19 ENCOUNTER — Other Ambulatory Visit: Payer: Self-pay

## 2024-03-19 ENCOUNTER — Ambulatory Visit: Payer: Medicaid Other | Admitting: Family Medicine

## 2024-03-19 ENCOUNTER — Ambulatory Visit: Payer: Medicaid Other | Attending: Maternal & Fetal Medicine

## 2024-03-19 VITALS — BP 107/66 | HR 91 | Wt 148.5 lb

## 2024-03-19 DIAGNOSIS — O99613 Diseases of the digestive system complicating pregnancy, third trimester: Secondary | ICD-10-CM

## 2024-03-19 DIAGNOSIS — Z148 Genetic carrier of other disease: Secondary | ICD-10-CM

## 2024-03-19 DIAGNOSIS — B009 Herpesviral infection, unspecified: Secondary | ICD-10-CM

## 2024-03-19 DIAGNOSIS — Z3492 Encounter for supervision of normal pregnancy, unspecified, second trimester: Secondary | ICD-10-CM

## 2024-03-19 DIAGNOSIS — Z3A24 24 weeks gestation of pregnancy: Secondary | ICD-10-CM

## 2024-03-19 DIAGNOSIS — Z362 Encounter for other antenatal screening follow-up: Secondary | ICD-10-CM | POA: Diagnosis not present

## 2024-03-19 DIAGNOSIS — J45909 Unspecified asthma, uncomplicated: Secondary | ICD-10-CM

## 2024-03-19 DIAGNOSIS — O98512 Other viral diseases complicating pregnancy, second trimester: Secondary | ICD-10-CM | POA: Diagnosis not present

## 2024-03-19 NOTE — Progress Notes (Signed)
   PRENATAL VISIT NOTE  Subjective:  Angela Branch is a 28 y.o. [phone] at [redacted]w[redacted]d being seen today for ongoing prenatal care.  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  Patient reports no bleeding, no cramping, and occasional contractions.  Contractions: Irritability. Vag. Bleeding: None.  Movement: Present. Denies leaking of fluid.   The following portions of the patient's history were reviewed and updated as appropriate: allergies, current medications, past family history, past medical history, past social history, past surgical history and problem list.   Objective:   Vitals:   03/19/24 1132  BP: 107/66  Pulse: 91  Weight: 148 lb 8 oz (67.4 kg)    Fetal Status: Fetal Heart Rate (bpm): 140   Movement: Present     General:  Alert, oriented and cooperative. Patient is in no acute distress.  Skin: Skin is warm and dry. No rash noted.   Cardiovascular: Normal heart rate noted  Respiratory: Normal respiratory effort, no problems with respiration noted  Abdomen: Soft, gravid, appropriate for gestational age.  Pain/Pressure: Present     Pelvic: Cervical exam deferred        Extremities: Normal range of motion.  Edema: None  Mental Status: Normal mood and affect. Normal behavior. Normal judgment and thought content.   Assessment and Plan:  Pregnancy: [id number] at [redacted]w[redacted]d 1. Encounter for supervision of low-risk pregnancy in second trimester (Primary) FHR and BP appropriate today Continue routine prenatal care  2. Carrier of Duchenne muscular  dystrophy Negative panorama  3. Herpes simplex type 2 (HSV-2) infection affecting pregnancy, antepartum Will need prophylaxis between 34 and 36 weeks  Preterm labor symptoms and general obstetric precautions including but not limited to vaginal bleeding, contractions, leaking of fluid and fetal movement were reviewed in detail with the patient. Please refer to After Visit Summary for other counseling recommendations.   No follow-ups on file.  Future Appointments  Date Time Provider Department Center  04/26/2024  8:20 AM [facility]  04/26/2024  8:55 AM Abir Eroh, Cyndi Lennert, MD [facility]    Celedonio Savage, MD

## 2024-03-30 ENCOUNTER — Encounter: Payer: Self-pay | Admitting: Certified Nurse Midwife

## 2024-04-25 ENCOUNTER — Other Ambulatory Visit: Payer: Self-pay

## 2024-04-25 DIAGNOSIS — Z3493 Encounter for supervision of normal pregnancy, unspecified, third trimester: Secondary | ICD-10-CM

## 2024-04-25 DIAGNOSIS — Z3491 Encounter for supervision of normal pregnancy, unspecified, first trimester: Secondary | ICD-10-CM

## 2024-04-26 ENCOUNTER — Other Ambulatory Visit: Payer: Self-pay

## 2024-04-26 ENCOUNTER — Ambulatory Visit: Payer: Self-pay | Admitting: Family Medicine

## 2024-04-26 ENCOUNTER — Other Ambulatory Visit

## 2024-04-26 VITALS — BP 110/68 | HR 94 | Wt 153.8 lb

## 2024-04-26 DIAGNOSIS — Z3A29 29 weeks gestation of pregnancy: Secondary | ICD-10-CM

## 2024-04-26 DIAGNOSIS — B009 Herpesviral infection, unspecified: Secondary | ICD-10-CM

## 2024-04-26 DIAGNOSIS — O98513 Other viral diseases complicating pregnancy, third trimester: Secondary | ICD-10-CM

## 2024-04-26 DIAGNOSIS — Z148 Genetic carrier of other disease: Secondary | ICD-10-CM

## 2024-04-26 DIAGNOSIS — Z3493 Encounter for supervision of normal pregnancy, unspecified, third trimester: Secondary | ICD-10-CM

## 2024-04-26 NOTE — Progress Notes (Signed)
   PRENATAL VISIT NOTE  Subjective:  Angela Branch is a 28 y.o. [phone] at [redacted]w[redacted]d being seen today for ongoing prenatal care.  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  Patient reports no bleeding, no cramping, no leaking, and occasional contractions.  Contractions: Irritability. Vag. Bleeding: None.  Movement: Present. Denies leaking of fluid.   The following portions of the patient's history were reviewed and updated as appropriate: allergies, current medications, past family history, past medical history, past social history, past surgical history and problem list.   Objective:   Vitals:   04/26/24 0920  BP: 110/68  Pulse: 94  Weight: 153 lb 12.8 oz (69.8 kg)    Fetal Status: Fetal Heart Rate (bpm): 139   Movement: Present     General:  Alert, oriented and cooperative. Patient is in no acute distress.  Skin: Skin is warm and dry. No rash noted.   Cardiovascular: Normal heart rate noted  Respiratory: Normal respiratory effort, no problems with respiration noted  Abdomen: Soft, gravid, appropriate for gestational age.  Pain/Pressure: Absent     Pelvic: Cervical exam performed in the presence of a chaperone        Extremities: Normal range of motion.  Edema: Trace  Mental Status: Normal mood and affect. Normal behavior. Normal judgment and thought content.   Assessment and Plan:  Pregnancy: [id number] at [redacted]w[redacted]d 1. Encounter for supervision of low-risk pregnancy in third trimester (Primary) FHR and BP appropriate today Patient was feeling more  pressure in her pelvis.  SVE closed/thick/-2 Having 28-week labs today  2. Herpes simplex type 2 (HSV-2) infection affecting pregnancy, antepartum Will need prophylaxis starting 34 to 36 weeks  3. 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 [redacted] weeks gestation of pregnancy   Preterm labor symptoms and general obstetric precautions including but not limited to vaginal bleeding, contractions, leaking of fluid and fetal movement were reviewed in detail with the patient. Please refer to After Visit Summary for other counseling recommendations.   No follow-ups on file.  Future Appointments  Date Time Provider Department Center  05/10/2024 10:35 AM Randel Buss, Mardee Shackle, MD [facility]    Ferdie Housekeeper, MD

## 2024-04-28 LAB — CBC
Hematocrit: 33.8 % — ABNORMAL LOW (ref 34.0–46.6)
Hemoglobin: 11.1 g/dL (ref 11.1–15.9)
MCH: 29.4 pg (ref 26.6–33.0)
MCHC: 32.8 g/dL (ref 31.5–35.7)
MCV: 89 fL (ref 79–97)
Platelets: 170 10*3/uL (ref 150–450)
RBC: 3.78 x10E6/uL (ref 3.77–5.28)
RDW: 12.5 % (ref 11.7–15.4)
WBC: 10.2 10*3/uL (ref 3.4–10.8)

## 2024-04-28 LAB — GLUCOSE TOLERANCE, 2 HOURS W/ 1HR
Glucose, 1 hour: 136 mg/dL (ref 70–179)
Glucose, 2 hour: 126 mg/dL (ref 70–152)
Glucose, Fasting: 72 mg/dL (ref 70–91)

## 2024-04-28 LAB — HIV ANTIBODY (ROUTINE TESTING W REFLEX): HIV Screen 4th Generation wRfx: NONREACTIVE

## 2024-04-28 LAB — RPR: RPR Ser Ql: NONREACTIVE

## 2024-04-30 ENCOUNTER — Encounter: Payer: Self-pay | Admitting: Family Medicine

## 2024-05-10 ENCOUNTER — Ambulatory Visit: Admitting: Family Medicine

## 2024-05-10 ENCOUNTER — Other Ambulatory Visit (HOSPITAL_COMMUNITY)
Admission: RE | Admit: 2024-05-10 | Discharge: 2024-05-10 | Disposition: A | Discharge status: Home / Self Care | Source: Ambulatory Visit | Attending: Family Medicine | Admitting: Family Medicine

## 2024-05-10 ENCOUNTER — Other Ambulatory Visit: Payer: Self-pay

## 2024-05-10 VITALS — BP 112/69 | HR 98 | Wt 158.0 lb

## 2024-05-10 DIAGNOSIS — O26893 Other specified pregnancy related conditions, third trimester: Secondary | ICD-10-CM

## 2024-05-10 DIAGNOSIS — Z3493 Encounter for supervision of normal pregnancy, unspecified, third trimester: Secondary | ICD-10-CM | POA: Insufficient documentation

## 2024-05-10 DIAGNOSIS — Z148 Genetic carrier of other disease: Secondary | ICD-10-CM

## 2024-05-10 DIAGNOSIS — Z3A31 31 weeks gestation of pregnancy: Secondary | ICD-10-CM

## 2024-05-10 DIAGNOSIS — N898 Other specified noninflammatory disorders of vagina: Secondary | ICD-10-CM | POA: Diagnosis present

## 2024-05-10 DIAGNOSIS — B009 Herpesviral infection, unspecified: Secondary | ICD-10-CM

## 2024-05-10 DIAGNOSIS — O2643 Herpes gestationis, third trimester: Secondary | ICD-10-CM

## 2024-05-10 DIAGNOSIS — O23593 Infection of other part of genital tract in pregnancy, third trimester: Secondary | ICD-10-CM

## 2024-05-10 NOTE — Progress Notes (Signed)
   PRENATAL VISIT NOTE  Subjective:  Angela Branch is a 28 y.o. [phone] at [redacted]w[redacted]d being seen today for ongoing prenatal care.  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  Patient reports no bleeding, no contractions, no cramping, and no leaking.  Contractions: Not present. Vag. Bleeding: None.  Movement: Present. Denies leaking of fluid.   The following portions of the patient's history were reviewed and updated as appropriate: allergies, current medications, past family history, past medical history, past social history, past surgical history and problem list.   Objective:    Vitals:   05/10/24 1055  BP: 112/69  Pulse: 98  Weight: 158 lb (71.7 kg)    Fetal Status:  Fetal Heart Rate (bpm): 146   Movement: Present    General: Alert, oriented and cooperative. Patient is in no acute distress.  Skin: Skin is warm and dry. No rash noted.   Cardiovascular: Normal heart rate noted  Respiratory: Normal respiratory effort, no problems with respiration noted  Abdomen: Soft, gravid, appropriate for gestational age.  Pain/Pressure: Absent     Pelvic: Cervical exam deferred        Extremities: Normal range of motion.  Edema: None  Mental Status: Normal mood and affect. Normal behavior. Normal judgment and thought content.   Assessment and Plan:  Pregnancy: [id number] at [redacted]w[redacted]d 1. Encounter for supervision of low-risk pregnancy in third trimester (Primary) FHR and BP appropriate today Continue routine prenatal care - Cervicovaginal ancillary only( [facility])  2. Herpes simplex type 2 infection Will need prophylaxis starting between 34 and 36 weeks  3. 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 [redacted] weeks gestation of pregnancy  5. Vaginal discharge during pregnancy in third trimester Wet prep collected today - Cervicovaginal ancillary only( [place])  Preterm labor symptoms and general obstetric precautions including but not limited to vaginal bleeding, contractions, leaking of fluid and fetal movement were reviewed in detail with the patient. Please refer to After Visit Summary for other counseling recommendations.   No follow-ups on file.  Future Appointments  Date Time Provider Department Center  05/26/2024  3:15 PM Ferdie Housekeeper, MD [facility]  06/09/2024  3:15 PM Randel Buss, Mardee Shackle, MD [facility]    Ferdie Housekeeper, MD

## 2024-05-11 LAB — CERVICOVAGINAL ANCILLARY ONLY
Bacterial Vaginitis (gardnerella): POSITIVE — AB
Candida Glabrata: NEGATIVE
Candida Vaginitis: NEGATIVE
Chlamydia: NEGATIVE
Comment: NEGATIVE
Comment: NEGATIVE
Comment: NEGATIVE
Comment: NEGATIVE
Comment: NEGATIVE
Comment: NORMAL
Neisseria Gonorrhea: NEGATIVE
Trichomonas: NEGATIVE

## 2024-05-12 ENCOUNTER — Ambulatory Visit: Payer: Self-pay | Admitting: Family Medicine

## 2024-05-12 MED ORDER — METRONIDAZOLE 500 MG PO TABS: 500.0000 mg | ORAL_TABLET | Freq: Two times a day (BID) | ORAL | 0 refills | Status: DC

## 2024-05-18 ENCOUNTER — Encounter: Admitting: Certified Nurse Midwife

## 2024-05-23 ENCOUNTER — Inpatient Hospital Stay (HOSPITAL_BASED_OUTPATIENT_CLINIC_OR_DEPARTMENT_OTHER)

## 2024-05-23 ENCOUNTER — Inpatient Hospital Stay (HOSPITAL_COMMUNITY)
Admission: AD | Admit: 2024-05-23 | Discharge: 2024-05-30 | DRG: 797 | Disposition: A | Discharge status: Home / Self Care | Attending: Obstetrics and Gynecology | Admitting: Obstetrics and Gynecology

## 2024-05-23 ENCOUNTER — Encounter (HOSPITAL_COMMUNITY): Payer: Self-pay | Admitting: Obstetrics & Gynecology

## 2024-05-23 ENCOUNTER — Other Ambulatory Visit: Payer: Self-pay

## 2024-05-23 DIAGNOSIS — O98513 Other viral diseases complicating pregnancy, third trimester: Secondary | ICD-10-CM | POA: Diagnosis not present

## 2024-05-23 DIAGNOSIS — Z3493 Encounter for supervision of normal pregnancy, unspecified, third trimester: Principal | ICD-10-CM

## 2024-05-23 DIAGNOSIS — Z3A33 33 weeks gestation of pregnancy: Secondary | ICD-10-CM | POA: Diagnosis not present

## 2024-05-23 DIAGNOSIS — J45909 Unspecified asthma, uncomplicated: Secondary | ICD-10-CM | POA: Diagnosis not present

## 2024-05-23 DIAGNOSIS — O26893 Other specified pregnancy related conditions, third trimester: Secondary | ICD-10-CM

## 2024-05-23 DIAGNOSIS — Z8249 Family history of ischemic heart disease and other diseases of the circulatory system: Secondary | ICD-10-CM | POA: Diagnosis not present

## 2024-05-23 DIAGNOSIS — Z302 Encounter for sterilization: Secondary | ICD-10-CM | POA: Diagnosis not present

## 2024-05-23 DIAGNOSIS — Z148 Genetic carrier of other disease: Secondary | ICD-10-CM | POA: Diagnosis not present

## 2024-05-23 DIAGNOSIS — F418 Other specified anxiety disorders: Secondary | ICD-10-CM | POA: Diagnosis not present

## 2024-05-23 DIAGNOSIS — Z560 Unemployment, unspecified: Secondary | ICD-10-CM

## 2024-05-23 DIAGNOSIS — O42113 Preterm premature rupture of membranes, onset of labor more than 24 hours following rupture, third trimester: Secondary | ICD-10-CM

## 2024-05-23 DIAGNOSIS — O4693 Antepartum hemorrhage, unspecified, third trimester: Secondary | ICD-10-CM | POA: Diagnosis not present

## 2024-05-23 DIAGNOSIS — A6000 Herpesviral infection of urogenital system, unspecified: Secondary | ICD-10-CM | POA: Diagnosis present

## 2024-05-23 DIAGNOSIS — O98413 Viral hepatitis complicating pregnancy, third trimester: Secondary | ICD-10-CM | POA: Diagnosis not present

## 2024-05-23 DIAGNOSIS — Z833 Family history of diabetes mellitus: Secondary | ICD-10-CM | POA: Diagnosis not present

## 2024-05-23 DIAGNOSIS — O35EXX0 Maternal care for other (suspected) fetal abnormality and damage, fetal genitourinary anomalies, not applicable or unspecified: Secondary | ICD-10-CM | POA: Diagnosis not present

## 2024-05-23 DIAGNOSIS — O42913 Preterm premature rupture of membranes, unspecified as to length of time between rupture and onset of labor, third trimester: Principal | ICD-10-CM | POA: Diagnosis present

## 2024-05-23 DIAGNOSIS — K219 Gastro-esophageal reflux disease without esophagitis: Secondary | ICD-10-CM | POA: Diagnosis not present

## 2024-05-23 DIAGNOSIS — O99513 Diseases of the respiratory system complicating pregnancy, third trimester: Secondary | ICD-10-CM | POA: Diagnosis not present

## 2024-05-23 DIAGNOSIS — R109 Unspecified abdominal pain: Secondary | ICD-10-CM

## 2024-05-23 DIAGNOSIS — O9832 Other infections with a predominantly sexual mode of transmission complicating childbirth: Secondary | ICD-10-CM | POA: Diagnosis present

## 2024-05-23 DIAGNOSIS — B009 Herpesviral infection, unspecified: Secondary | ICD-10-CM | POA: Diagnosis not present

## 2024-05-23 DIAGNOSIS — O9962 Diseases of the digestive system complicating childbirth: Secondary | ICD-10-CM | POA: Diagnosis present

## 2024-05-23 DIAGNOSIS — G4733 Obstructive sleep apnea (adult) (pediatric): Secondary | ICD-10-CM | POA: Diagnosis not present

## 2024-05-23 DIAGNOSIS — Z3A34 34 weeks gestation of pregnancy: Secondary | ICD-10-CM | POA: Diagnosis not present

## 2024-05-23 LAB — URINALYSIS, ROUTINE W REFLEX MICROSCOPIC
Bacteria, UA: NONE SEEN
Bilirubin Urine: NEGATIVE
Glucose, UA: NEGATIVE mg/dL
Hgb urine dipstick: NEGATIVE
Ketones, ur: NEGATIVE mg/dL
Leukocytes,Ua: NEGATIVE
Nitrite: POSITIVE — AB
Protein, ur: NEGATIVE mg/dL
Specific Gravity, Urine: 1.015 (ref 1.005–1.030)
pH: 6 (ref 5.0–8.0)

## 2024-05-23 LAB — CBC
HCT: 34.2 % — ABNORMAL LOW (ref 36.0–46.0)
Hemoglobin: 11.1 g/dL — ABNORMAL LOW (ref 12.0–15.0)
MCH: 29.3 pg (ref 26.0–34.0)
MCHC: 32.5 g/dL (ref 30.0–36.0)
MCV: 90.2 fL (ref 80.0–100.0)
Platelets: 170 10*3/uL (ref 150–400)
RBC: 3.79 MIL/uL — ABNORMAL LOW (ref 3.87–5.11)
RDW: 12.8 % (ref 11.5–15.5)
WBC: 9.5 10*3/uL (ref 4.0–10.5)
nRBC: 0 % (ref 0.0–0.2)

## 2024-05-23 LAB — TYPE AND SCREEN
ABO/RH(D): A POS
Antibody Screen: NEGATIVE

## 2024-05-23 LAB — POCT FERN TEST: POCT Fern Test: POSITIVE — AB

## 2024-05-23 MED ORDER — LACTATED RINGERS IV BOLUS
1000.0000 mL | Freq: Once | INTRAVENOUS | Status: AC
  Administered 2024-05-23: 1000 mL via INTRAVENOUS

## 2024-05-23 MED ORDER — BETAMETHASONE SOD PHOS & ACET 6 (3-3) MG/ML IJ SUSP
12.0000 mg | INTRAMUSCULAR | Status: AC
  Administered 2024-05-23 – 2024-05-24 (×2): 12 mg via INTRAMUSCULAR
  Filled 2024-05-23 (×2): qty 5

## 2024-05-23 MED ORDER — ONDANSETRON HCL 4 MG/2ML IJ SOLN
4.0000 mg | Freq: Three times a day (TID) | INTRAMUSCULAR | Status: DC | PRN
  Administered 2024-05-23: 4 mg via INTRAVENOUS
  Filled 2024-05-23: qty 2

## 2024-05-23 MED ORDER — FAMOTIDINE IN NACL 20-0.9 MG/50ML-% IV SOLN
20.0000 mg | Freq: Two times a day (BID) | INTRAVENOUS | Status: DC
  Administered 2024-05-23: 20 mg via INTRAVENOUS
  Filled 2024-05-23: qty 50

## 2024-05-23 MED ORDER — SODIUM CHLORIDE 0.9 % IV SOLN
500.0000 mg | INTRAVENOUS | Status: AC
  Administered 2024-05-23 (×2): 500 mg via INTRAVENOUS
  Filled 2024-05-23 (×2): qty 5

## 2024-05-23 MED ORDER — MAGNESIUM SULFATE 40 GM/1000ML IV SOLN: 2.0000 g/h | INTRAVENOUS | Status: AC

## 2024-05-23 MED ORDER — AMOXICILLIN 500 MG PO CAPS
500.0000 mg | ORAL_CAPSULE | Freq: Three times a day (TID) | ORAL | Status: DC
  Administered 2024-05-25 – 2024-05-27 (×5): 500 mg via ORAL
  Filled 2024-05-23 (×7): qty 1

## 2024-05-23 MED ORDER — MORPHINE SULFATE (PF) 4 MG/ML IV SOLN
10.0000 mg | Freq: Once | INTRAVENOUS | Status: AC
  Administered 2024-05-23: 10 mg via INTRAMUSCULAR

## 2024-05-23 MED ORDER — TERBUTALINE SULFATE 1 MG/ML IJ SOLN: 0.2500 mg | Freq: Once | INTRAMUSCULAR | Status: DC | PRN

## 2024-05-23 MED ORDER — VALACYCLOVIR HCL 500 MG PO TABS
500.0000 mg | ORAL_TABLET | Freq: Two times a day (BID) | ORAL | Status: DC
  Administered 2024-05-23 – 2024-05-27 (×7): 500 mg via ORAL
  Filled 2024-05-23 (×12): qty 1

## 2024-05-23 MED ORDER — SODIUM CHLORIDE 0.9% FLUSH
3.0000 mL | Freq: Two times a day (BID) | INTRAVENOUS | Status: DC
  Administered 2024-05-24 – 2024-05-29 (×7): 10 mL via INTRAVENOUS

## 2024-05-23 MED ORDER — METRONIDAZOLE 500 MG PO TABS
500.0000 mg | ORAL_TABLET | Freq: Two times a day (BID) | ORAL | Status: DC
  Administered 2024-05-23 – 2024-05-24 (×4): 500 mg via ORAL
  Filled 2024-05-23 (×4): qty 1

## 2024-05-23 MED ORDER — SODIUM CHLORIDE 0.9% FLUSH: 3.0000 mL | INTRAVENOUS | Status: DC | PRN

## 2024-05-23 MED ORDER — SODIUM CHLORIDE 0.9 % IV SOLN
2.0000 g | Freq: Four times a day (QID) | INTRAVENOUS | Status: AC
  Administered 2024-05-23 – 2024-05-25 (×8): 2 g via INTRAVENOUS
  Filled 2024-05-23 (×8): qty 2000

## 2024-05-23 MED ORDER — DOCUSATE SODIUM 100 MG PO CAPS: 100.0000 mg | ORAL_CAPSULE | Freq: Every day | ORAL | Status: DC

## 2024-05-23 MED ORDER — PRENATAL MULTIVITAMIN CH
1.0000 | ORAL_TABLET | Freq: Every day | ORAL | Status: DC
  Administered 2024-05-23 – 2024-05-24 (×2): 1 via ORAL
  Filled 2024-05-23 (×3): qty 1

## 2024-05-23 MED ORDER — FENTANYL CITRATE (PF) 100 MCG/2ML IJ SOLN
50.0000 ug | Freq: Once | INTRAMUSCULAR | Status: AC
  Administered 2024-05-23: 50 ug via INTRAVENOUS
  Filled 2024-05-23: qty 2

## 2024-05-23 MED ORDER — MORPHINE SULFATE (PF) 4 MG/ML IV SOLN
INTRAVENOUS | Status: AC
Start: 2024-05-23 — End: 2024-05-23
  Filled 2024-05-23: qty 3

## 2024-05-23 MED ORDER — SODIUM CHLORIDE 0.9 % IV SOLN: 500.0000 mg | Freq: Once | INTRAVENOUS | Status: DC

## 2024-05-23 MED ORDER — MAGNESIUM SULFATE BOLUS VIA INFUSION
4.0000 g | Freq: Once | INTRAVENOUS | Status: DC
  Filled 2024-05-23: qty 1000

## 2024-05-23 MED ORDER — CALCIUM CARBONATE ANTACID 500 MG PO CHEW: 2.0000 | CHEWABLE_TABLET | ORAL | Status: DC | PRN

## 2024-05-23 MED ORDER — AZITHROMYCIN 250 MG PO TABS: 1000.0000 mg | ORAL_TABLET | Freq: Once | ORAL | Status: DC

## 2024-05-23 MED ORDER — MORPHINE SULFATE (PF) 10 MG/ML IV SOLN
10.0000 mg | Freq: Once | INTRAVENOUS | Status: DC
  Filled 2024-05-23: qty 1

## 2024-05-23 MED ORDER — ACETAMINOPHEN 325 MG PO TABS
650.0000 mg | ORAL_TABLET | ORAL | Status: DC | PRN
  Administered 2024-05-23 (×2): 650 mg via ORAL
  Filled 2024-05-23 (×2): qty 2

## 2024-05-23 MED ORDER — LACTATED RINGERS IV SOLN: INTRAVENOUS | Status: AC

## 2024-05-23 MED ORDER — SODIUM CHLORIDE 0.9 % IV SOLN
25.0000 mg | Freq: Once | INTRAVENOUS | Status: AC
  Administered 2024-05-23: 25 mg via INTRAVENOUS
  Filled 2024-05-23: qty 1

## 2024-05-23 NOTE — H&P (Cosign Needed)
 FACULTY PRACTICE ANTEPARTUM ADMISSION HISTORY AND PHYSICAL NOTE   History of Present Illness: Angela Branch is a 28 y.o. [id number] at [redacted]w[redacted]d admitted for PPROM.  Patient reports the fetal movement as active. Patient reports uterine contraction  activity as initially regular, now more spaced apart, better after fluids. Patient reports  vaginal bleeding as light bleeding, has had to change panty liner twice today. Patient describes fluid per vagina as Clear. Fetal presentation is cephalic.  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   PTSD (post-traumatic stress disorder) 10/17/2017   Adjustment disorder with mixed anxiety and depressed mood 04/29/2016   Asthma 06/12/2012    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   Hyperemesis 11/26/2023   Weight loss from 68kg (11/21) to 63.8kg (12/3)     IUD (intrauterine device) in place 07/03/2023   Cu-IUD inserted 05/2023     Loss of weight 03/25/2013   Low back pain 09/20/2013   Menorrhagia 09/28/2013   Migraine headache 02/23/2014   Sleep apnea 04/14/2014   Substance abuse (HCC) 03/24/2015   + cannabis    Thyroid  disease     Past Surgical History:   Procedure Laterality Date   TOOTH EXTRACTION     WRIST SURGERY  08/30/2020    OB History  Gravida Para Term Preterm AB Living  6 4 4  0 1 4  SAB IAB Ectopic Multiple Live Births  1 0 0 0 4    # Outcome Date GA Lbr Len/2nd Weight Sex Type Anes PTL Lv  6 Current           5 Term 01/17/22 [redacted]w[redacted]d 06:09 / 00:15 3555 g M Vag-Spont EPI  LIV     Birth Comments: facial bruising  4 Term 01/12/20 [redacted]w[redacted]d 02:14 / 00:12 3260 g M Vag-Spont EPI  LIV     Birth Comments: WNL  3 Term 09/22/18 [redacted]w[redacted]d 558:17 / 00:23 2900 g F Vag-Spont EPI  LIV  2 Term 12/01/15 [redacted]w[redacted]d 16:59 / 03:13 3245 g M Vag-Spont EPI  LIV     Birth Comments: Hgb, Normal, Fa Newborn Screen Barcode: [id number] Date collected: 12/02/2015  1 SAB 2015             Birth Comments: not seen in hospital, not sure how far she was    Social History   Socioeconomic History   Marital status: Single    Spouse name: Not on file   Number of children: 4   Years of education: 9th   Highest education level: Associate degree: academic program  Occupational History    Employer: UNEMPLOYED  Tobacco Use   Smoking status: Never   Smokeless tobacco: Never  Vaping Use   Vaping status: Never Used  Substance and  Sexual Activity   Alcohol use: No    Alcohol/week: 0.0 standard drinks of alcohol   Drug use: No   Sexual activity: Not Currently    Birth control/protection: None  Other Topics Concern   Not on file  Social History Narrative   Patient is currently pregnant with her 5th child   Social Drivers of Corporate investment banker Strain: Medium Risk (12/09/2023)   Overall Financial Resource Strain (CARDIA)    Difficulty of Paying Living Expenses: Somewhat hard  Food Insecurity: No Food Insecurity (03/08/2024)   Hunger Vital Sign    Worried About Running Out of Food in the Last Year: Never true    Ran Out of Food in the Last Year: Never true  Transportation Needs: No Transportation Needs (03/08/2024)   PRAPARE - Scientist, research (physical sciences) (Medical): No    Lack of Transportation (Non-Medical): No  Physical Activity: Unknown (12/09/2023)   Exercise Vital Sign    Days of Exercise per Week: Patient declined    Minutes of Exercise per Session: 60 min  Stress: No Stress Concern Present (12/09/2023)   [employer] of Occupational Health - Occupational Stress Questionnaire    Feeling of Stress : Not at all  Social Connections: Unknown (03/08/2024)   Social Connection and Isolation Panel [NHANES]    Frequency of Communication with Friends and Family: More than three times a week    Frequency of Social Gatherings with Friends and Family: More than three times a week    Attends Religious Services: Patient declined    Database administrator or Organizations: Patient declined    Attends Engineer, structural: Patient declined    Marital Status: Living with partner    Family History  Problem Relation Age of Onset   Asthma Father    Asthma Sister    Healthy Sister    Healthy Sister    Healthy Brother    Healthy Brother    Diabetes Maternal Grandmother    Cancer - Colon Maternal Grandfather    Hyperlipidemia Paternal Grandfather    Heart disease Paternal Grandfather    Mental illness Neg Hx    Birth defects Neg Hx    Kidney disease Neg Hx    Hypertension Neg Hx     No Active Allergies  Facility-Administered Medications Prior to Admission  Medication Dose Route Frequency Provider Last Rate Last Admin   ondansetron  (ZOFRAN ) 8 mg in sodium chloride  0.9 % 50 mL IVPB  8 mg Intravenous Once        Medications Prior to Admission  Medication Sig Dispense Refill Last Dose/Taking   acetaminophen  (TYLENOL ) 500 MG tablet Take 1,000 mg by mouth every 6 (six) hours as needed.   05/23/2024 at 11:00 AM   ondansetron  (ZOFRAN ) 4 MG tablet Take 1 tablet (4 mg total) by mouth every 6 (six) hours. 20 tablet 1 Past Week   albuterol  (VENTOLIN  HFA) 108 (90 Base) MCG/ACT inhaler Inhale 1 puff into the lungs every 6  (six) hours as needed (prn). (Patient not taking: Reported on 02/10/2024)   Unknown   butalbital -acetaminophen -caffeine  (FIORICET ) 50-325-40 MG tablet Take 1-2 tablets by mouth every 6 (six) hours as needed for headache. (Patient not taking: Reported on 02/10/2024) 20 tablet 0 Unknown   fluticasone  (FLONASE ) 50 MCG/ACT nasal spray Place 2 sprays into both nostrils daily. (Patient not taking: Reported on 02/10/2024) 9.9 mL 2 Unknown   glycopyrrolate  (ROBINUL ) 1 MG tablet Take 1 tablet (1 mg total)  by mouth 3 (three) times daily. (Patient not taking: Reported on 03/19/2024) 90 tablet 5 Unknown   metoCLOPramide  (REGLAN ) 10 MG tablet Take 1 tablet (10 mg total) by mouth 4 (four) times daily. (Patient not taking: Reported on 03/19/2024) 30 tablet 5 Unknown   metroNIDAZOLE  (FLAGYL ) 500 MG tablet Take 1 tablet (500 mg total) by mouth 2 (two) times daily. 14 tablet 0 Unknown   metroNIDAZOLE  (METROGEL ) 0.75 % vaginal gel Place 1 Applicatorful vaginally at bedtime. Apply one applicatorful to vagina at bedtime for 10 days (Patient not taking: Reported on 03/19/2024) 70 g 1 Unknown   ondansetron  (ZOFRAN -ODT) 4 MG disintegrating tablet DISSOLVE 1 TABLET(4 MG) ON THE TONGUE EVERY 6 HOURS AS NEEDED FOR NAUSEA (Patient not taking: Reported on 04/26/2024) 30 tablet 1 Unknown   promethazine  (PHENERGAN ) 25 MG tablet Take 1 tablet (25 mg total) by mouth at bedtime. (Patient not taking: Reported on 03/19/2024) 30 tablet 6 Unknown    Review of Systems - Negative except as mentioned in HPI  Vitals:  BP 106/80 (BP Location: Right Arm)   Pulse 88   Temp 98.4 F (36.9 C) (Oral)   Resp 17   Ht 5\' 3"  (1.6 m)   Wt 71 kg   LMP 07/09/2023 (Approximate) Comment: unsure of date  SpO2 100%   BMI 27.72 kg/m  Physical Examination: CONSTITUTIONAL: Well-developed, well-nourished female in no acute distress.  HENT:  Normocephalic, atraumatic, External right and left ear normal. Oropharynx is clear and moist EYES: Conjunctivae and EOM  are normal. Pupils are equal, round, and reactive to light. No scleral icterus.  NECK: Normal range of motion, supple, no masses SKIN: Skin is warm and dry. No rash noted. Not diaphoretic. No erythema. No pallor. NEUROLGIC: Alert and oriented to person, place, and time. Normal reflexes, muscle tone coordination. No cranial nerve deficit noted. PSYCHIATRIC: Normal mood and affect. Normal behavior. Normal judgment and thought content. CARDIOVASCULAR: Normal heart rate noted, regular rhythm RESPIRATORY: Effort and breath sounds normal, no problems with respiration noted ABDOMEN: Soft, nontender, nondistended, gravid. MUSCULOSKELETAL: Normal range of motion. No edema and no tenderness. 2+ distal pulses.  Cervix: Evaluated by sterile speculum exam. and found to be visually 1cm/ Long and fetal presentation is cephalic.; no lesions c/f HSV noted Membranes:ruptured, clear fluid Fetal Monitoring: 135/mod/+a/-d Tocometer: Initially every 2-3, spaced out after IV fluids  Labs:  Results for orders placed or performed during the hospital encounter of 05/23/24 (from the past 24 hours)  Urinalysis, Routine w reflex microscopic -Urine, Clean Catch   Collection Time: 05/23/24 12:43 PM  Result Value Ref Range   Color, Urine RED (A) YELLOW   APPearance CLEAR CLEAR   Specific Gravity, Urine 1.015 1.005 - 1.030   pH 6.0 5.0 - 8.0   Glucose, UA NEGATIVE NEGATIVE mg/dL   Hgb urine dipstick NEGATIVE NEGATIVE   Bilirubin Urine NEGATIVE NEGATIVE   Ketones, ur NEGATIVE NEGATIVE mg/dL   Protein, ur NEGATIVE NEGATIVE mg/dL   Nitrite POSITIVE (A) NEGATIVE   Leukocytes,Ua NEGATIVE NEGATIVE   RBC / HPF 0-5 0 - 5 RBC/hpf   WBC, UA 0-5 0 - 5 WBC/hpf   Bacteria, UA NONE SEEN NONE SEEN   Squamous Epithelial / HPF 6-10 0 - 5 /HPF   Mucus PRESENT   CBC   Collection Time: 05/23/24 12:56 PM  Result Value Ref Range   WBC 9.5 4.0 - 10.5 K/uL   RBC 3.79 (L) 3.87 - 5.11 MIL/uL   Hemoglobin 11.1 (L) 12.0 - 15.0 g/dL  HCT 34.2 (L) 36.0 - 46.0 %   MCV 90.2 80.0 - 100.0 fL   MCH 29.3 26.0 - 34.0 pg   MCHC 32.5 30.0 - 36.0 g/dL   RDW 78.2 95.6 - 21.3 %   Platelets 170 150 - 400 K/uL   nRBC 0.0 0.0 - 0.2 %  Type and screen [facility]   Collection Time: 05/23/24 12:56 PM  Result Value Ref Range   ABO/RH(D) A POS    Antibody Screen NEG    Sample Expiration      05/26/2024,2359 Performed at [facility], [address] [place], [place] [address]   Korene Pert Test   Collection Time: 05/23/24  2:58 PM  Result Value Ref Range   POCT Fern Test Positive = ruptured amniotic membanes (A)     Imaging Studies: US  MFM OB LIMITED Result Date: 05/23/2024 ----------------------------------------------------------------------  OBSTETRICS REPORT                       (Signed Final 05/23/2024 02:21 pm) ---------------------------------------------------------------------- Patient Info  ID #:       [id number]                          D.O.B.:  [birth year] (27 yrs)(F)  Name:       Angela Branch                Visit Date: 05/23/2024 01:39 pm ---------------------------------------------------------------------- Performed By  Attending:        Sal Crass MD         Ref. Address:      [facility]  Performed By:     Lonny Robertson       [facility]  Referred By:      Melanie Spires                    MD ---------------------------------------------------------------------- Orders  #  Description                           Code        Ordered By  1  US  MFM OB LIMITED                     84132.44    Melanie Spires ----------------------------------------------------------------------  #  Order #                     Accession #                Episode #  1  [id number]                   [id number]                 [id number] ---------------------------------------------------------------------- Indications  Vaginal bleeding in pregnancy,  third trimester  O46.93  Abdominal pain in pregnancy  O99.89  [redacted] weeks gestation of pregnancy                 Z3A.33 ---------------------------------------------------------------------- Fetal Evaluation  Num Of Fetuses:          1  Fetal Heart Rate(bpm):   158  Cardiac Activity:        Observed  Presentation:            Cephalic  Placenta:                Anterior  P. Cord Insertion:       Visualized, central  Amniotic Fluid  AFI FV:      Within normal limits  AFI Sum(cm)     %Tile       Largest Pocket(cm)  11.9            32          4.8  RUQ(cm)       RLQ(cm)       LUQ(cm)  4.8           2.6           4.5 ---------------------------------------------------------------------- Biometry  LV:        6.2  mm ---------------------------------------------------------------------- OB History  Blood Type:   A+  Gravidity:    6         Term:   4         SAB:   1  Living:       4 ---------------------------------------------------------------------- Gestational Age  LMP:           45w 4d        Date:  07/09/23                  EDD:   04/14/24  Best:          33w 2d     Det. By:  U/S C R L  (11/19/23)    EDD:   07/09/24 ---------------------------------------------------------------------- Anatomy  Ventricles:            Appears normal         Stomach:                Appears normal, left                                                                        sided  Heart:                 Appears normal         Kidneys:                Appear normal                         (4CH, axis, and                         situs)  Diaphragm:             Appears normal         Bladder:                Appears normal ---------------------------------------------------------------------- Cervix Uterus Adnexa  Cervix  Not visualized (advanced GA >24wks) ---------------------------------------------------------------------- Comments  This patient presented to the MAU due to abdominal pain and  vaginal bleedingg.  A limited  ultrasound performed today shows that the fetus is  in the vertex presentation.  There was normal amniotic fluid noted with a total AFI of 11.9  cm.  A normal appearing anterior placenta is noted.  There were no signs of placenta previa. ----------------------------------------------------------------------                  Sal Crass, MD Electronically Signed Final Report   05/23/2024 02:21 pm ----------------------------------------------------------------------     Assessment and Plan: 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

## 2024-05-23 NOTE — MAU Provider Note (Signed)
 History     CSN: [id number]  Arrival date and time: 05/23/24 1204   Event Date/Time   First Provider Initiated Contact with Patient 05/23/2024 12:15 PM   Chief Complaint  Patient presents with   Contractions    HPI  Angela Branch is a 28 y.o. [id number] at [redacted]w[redacted]d who presents to the MAU for vaginal bleeding. She noticed increased vaginal discharge this morning around 0300, took a shower and wiped when she noticed blood tinged mucus. She subsequently had two episodes of light spotting. She has had some leaking of fluid since as well. Endorses mild back pain and intermittent contractions, but contractions not stronger or becoming more frequent. No urinary sxs, vulvovaginal irritation or pain. She had known hx HSV, no recent outbreaks. Normal FM.   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   Hyperemesis 11/26/2023   Weight loss from 68kg (11/21) to 63.8kg (12/3)     IUD (intrauterine device) in place 07/03/2023   Cu-IUD inserted 05/2023     Loss of weight 03/25/2013   Low back pain 09/20/2013   Menorrhagia 09/28/2013   Migraine headache 02/23/2014   Sleep apnea 04/14/2014   Substance abuse (HCC) 03/24/2015   + cannabis    Thyroid  disease     Past Surgical History:  Procedure Laterality Date   TOOTH EXTRACTION     WRIST SURGERY  08/30/2020    Family History  Problem Relation Age of Onset   Asthma Father    Asthma Sister    Healthy Sister    Healthy Sister    Healthy Brother    Healthy Brother    Diabetes Maternal Grandmother    Cancer - Colon Maternal Grandfather    Hyperlipidemia Paternal Grandfather    Heart  disease Paternal Grandfather    Mental illness Neg Hx    Birth defects Neg Hx    Kidney disease Neg Hx    Hypertension Neg Hx     Social History   Tobacco Use   Smoking status: Never   Smokeless tobacco: Never  Vaping Use   Vaping status: Never Used  Substance Use Topics   Alcohol use: No    Alcohol/week: 0.0 standard drinks of alcohol   Drug use: No    Allergies:  Allergies  Allergen Reactions   Azithromycin  Diarrhea    Diarrhea, nausea/vomiting, IV burns vein Diarrhea, nausea/vomiting, IV burns vein    Facility-Administered Medications Prior to Admission  Medication Dose Route Frequency Provider Last Rate Last Admin   ondansetron  (ZOFRAN ) 8 mg in sodium chloride  0.9 % 50 mL IVPB  8 mg Intravenous Once        Medications Prior to Admission  Medication Sig Dispense Refill Last Dose/Taking   acetaminophen  (TYLENOL ) 500 MG tablet Take 1,000 mg by mouth every 6 (six) hours as needed.   05/23/2024 at 11:00 AM   ondansetron  (ZOFRAN ) 4 MG tablet Take 1 tablet (4 mg total) by mouth every 6 (six) hours. 20 tablet 1 Past Week   albuterol  (VENTOLIN  HFA) 108 (90 Base) MCG/ACT inhaler Inhale 1 puff into the lungs every 6 (six) hours as needed (prn). (  Patient not taking: Reported on 02/10/2024)   Unknown   butalbital -acetaminophen -caffeine  (FIORICET ) 50-325-40 MG tablet Take 1-2 tablets by mouth every 6 (six) hours as needed for headache. (Patient not taking: Reported on 02/10/2024) 20 tablet 0 Unknown   fluticasone  (FLONASE ) 50 MCG/ACT nasal spray Place 2 sprays into both nostrils daily. (Patient not taking: Reported on 02/10/2024) 9.9 mL 2 Unknown   glycopyrrolate  (ROBINUL ) 1 MG tablet Take 1 tablet (1 mg total) by mouth 3 (three) times daily. (Patient not taking: Reported on 03/19/2024) 90 tablet 5 Unknown   metoCLOPramide  (REGLAN ) 10 MG tablet Take 1 tablet (10 mg total) by mouth 4 (four) times daily. (Patient not taking: Reported on 03/19/2024) 30 tablet 5 Unknown   metroNIDAZOLE  (FLAGYL )  500 MG tablet Take 1 tablet (500 mg total) by mouth 2 (two) times daily. 14 tablet 0 Unknown   metroNIDAZOLE  (METROGEL ) 0.75 % vaginal gel Place 1 Applicatorful vaginally at bedtime. Apply one applicatorful to vagina at bedtime for 10 days (Patient not taking: Reported on 03/19/2024) 70 g 1 Unknown   ondansetron  (ZOFRAN -ODT) 4 MG disintegrating tablet DISSOLVE 1 TABLET(4 MG) ON THE TONGUE EVERY 6 HOURS AS NEEDED FOR NAUSEA (Patient not taking: Reported on 04/26/2024) 30 tablet 1 Unknown   promethazine  (PHENERGAN ) 25 MG tablet Take 1 tablet (25 mg total) by mouth at bedtime. (Patient not taking: Reported on 03/19/2024) 30 tablet 6 Unknown    ROS reviewed and pertinent positives and negatives as documented in HPI.  Physical Exam   Blood pressure 108/65, pulse 89, temperature 98.8 F (37.1 C), temperature source Oral, resp. rate 20, height 5\' 3"  (1.6 m), weight 71 kg, last menstrual period 07/09/2023, SpO2 100%, currently breastfeeding.  Physical Exam Exam conducted with a chaperone present.  Constitutional:      General: She is not in acute distress.    Appearance: Normal appearance. She is not ill-appearing.  HENT:     Head: Normocephalic and atraumatic.  Cardiovascular:     Rate and Rhythm: Normal rate.  Pulmonary:     Effort: Pulmonary effort is normal.     Breath sounds: Normal breath sounds.  Abdominal:     Palpations: Abdomen is soft.     Tenderness: There is no abdominal tenderness. There is no guarding.  Genitourinary:    Comments: Positive pooling noted on speculum exam, no active vaginal bleeding noted, some old brown discharge noted in vagina, cervix visually dilated 1cm Musculoskeletal:        General: Normal range of motion.  Skin:    General: Skin is warm and dry.     Findings: No rash.  Neurological:     General: No focal deficit present.     Mental Status: She is alert and oriented to person, place, and time.   EFM: 135/mod/+a/-d  MAU Course  Procedures  MDM 28  y.o. [id number] at [redacted]w[redacted]d presenting for vaginal bleeding. No active VB noted on exam, however pt with pos pooling and fern. U/S obtained - no evidence of abruption, anterior placenta. Discussed admit for PPROM - will give BMZ, abx.    Assessment and Plan     ICD-10-CM   1. Encounter for supervision of low-risk pregnancy in third trimester  Z34.93     Admit for PPROM, will start abx and BMZ   Semaj Kham, MD OB Fellow, Faculty Practice [facility], Center for [facility]  05/23/2024, 2:42 PM

## 2024-05-23 NOTE — MAU Note (Addendum)
 DYANARA COZZA is a 28 y.o. at [redacted]w[redacted]d here in MAU reporting: vaginal bleeding and abdominal pain that began this morning around 2am. Baby is moving well today. Denies any LOF. VB initially began as blood tinged discharge but has progressed to BRB which prompted the patient to come in. Has been wearing a panty liner and is noticing moderate BRB each time she changes it. Denies intercourse or trauma in the last 24 hours. Prescribed Flagyl  for BV on 5/21 but has not picked up RX. No other complaints today.    Onset of complaint: 05/23/24 at 0240 Pain score: 5/10 Vitals:   05/23/24 1230  BP: (!) 103/59  Pulse: 95  Resp: 20  Temp: 98.8 F (37.1 C)  SpO2: 100%     FHT: 135  Lab orders placed from triage: UA

## 2024-05-23 NOTE — Progress Notes (Signed)
 OB Note 05/23/2024 2100 I went to talk to patient re: tocolysis. Baby is category I and not tracing well with tocometry but patient feels contractions about q95m. Pt also with some nausea and back pain. I ordered Mg but patient hesistant to use it due to the qhr Mg checks that are needed. Rationale for tocolysis d/w her and goal for BMZ x 2, plan of care with PPROM. I told her the only other option is procardia  but I feel it has more side effects than Mg and given her BPs she may not be a candidate for it. Pt elects for Mg. Pepcid  and PRN zofran  ordered and will check BP and if still low than d/w her only other option is Mg or no treatment.   Patient Vitals for the past 24 hrs:  BP Temp Temp src Pulse Resp SpO2 Height Weight  05/23/24 1927 (!) 87/44 98.4 F (36.9 C) -- (!) 104 18 -- -- --  05/23/24 1508 106/80 98.4 F (36.9 C) Oral 88 17 100 % -- --  05/23/24 1335 108/65 -- -- 89 -- -- -- --  05/23/24 1300 (!) 101/59 -- -- 96 -- -- -- --  05/23/24 1230 (!) 103/59 98.8 F (37.1 C) Oral 95 20 100 % -- --  05/23/24 1217 -- -- -- -- -- -- 5\' 3"  (1.6 m) 71 kg   Tyler Gallant MD Attending Center for [facility] (Faculty Practice) GYN Consult Phone: [phone] (M-F, 0800-1700) & [phone] (Off hours, weekends, holidays)

## 2024-05-23 NOTE — Progress Notes (Signed)
 OB Note Called to see patient for increase low back pain s/p tylenol  and heat pack.  Category I tracing with accels, no decel, mod variability Toco q36m  NAD SVE: 1/50/ballotable, still feels cephalic. Pt was 1cm earlier this afternoon  D/w her re: continued recommendation for magnesium  due to concern for going into PTL and need for BMZ course. Pt still declines. Pt wanting something for pain. She received phenergan  25mg  IV at 2000. D/w he risks with opiods to FHR tracing and pt okay with using it. Will do IM morphine  10mg  x 1.   Tyler Gallant MD Attending Center for [employer] (Faculty Practice) 05/23/2024 Time: 2200

## 2024-05-24 ENCOUNTER — Inpatient Hospital Stay (HOSPITAL_BASED_OUTPATIENT_CLINIC_OR_DEPARTMENT_OTHER)

## 2024-05-24 DIAGNOSIS — O98513 Other viral diseases complicating pregnancy, third trimester: Secondary | ICD-10-CM | POA: Diagnosis not present

## 2024-05-24 DIAGNOSIS — J45909 Unspecified asthma, uncomplicated: Secondary | ICD-10-CM

## 2024-05-24 DIAGNOSIS — O42913 Preterm premature rupture of membranes, unspecified as to length of time between rupture and onset of labor, third trimester: Secondary | ICD-10-CM | POA: Diagnosis not present

## 2024-05-24 DIAGNOSIS — B009 Herpesviral infection, unspecified: Secondary | ICD-10-CM | POA: Diagnosis not present

## 2024-05-24 DIAGNOSIS — Z3A33 33 weeks gestation of pregnancy: Secondary | ICD-10-CM | POA: Diagnosis not present

## 2024-05-24 DIAGNOSIS — O35EXX0 Maternal care for other (suspected) fetal abnormality and damage, fetal genitourinary anomalies, not applicable or unspecified: Secondary | ICD-10-CM

## 2024-05-24 DIAGNOSIS — O99513 Diseases of the respiratory system complicating pregnancy, third trimester: Secondary | ICD-10-CM

## 2024-05-24 DIAGNOSIS — Z148 Genetic carrier of other disease: Secondary | ICD-10-CM | POA: Diagnosis not present

## 2024-05-24 MED ORDER — ONDANSETRON HCL 4 MG/2ML IJ SOLN
4.0000 mg | Freq: Three times a day (TID) | INTRAMUSCULAR | Status: DC | PRN
  Administered 2024-05-24 – 2024-05-26 (×7): 4 mg via INTRAVENOUS
  Filled 2024-05-24 (×7): qty 2

## 2024-05-24 MED ORDER — ACETAMINOPHEN 325 MG PO TABS
650.0000 mg | ORAL_TABLET | Freq: Four times a day (QID) | ORAL | Status: DC | PRN
  Administered 2024-05-24 – 2024-05-28 (×3): 650 mg via ORAL
  Filled 2024-05-24 (×3): qty 2

## 2024-05-24 MED ORDER — FAMOTIDINE 20 MG PO TABS
20.0000 mg | ORAL_TABLET | Freq: Two times a day (BID) | ORAL | Status: DC
  Administered 2024-05-24 – 2024-05-25 (×2): 20 mg via ORAL
  Filled 2024-05-24 (×3): qty 1

## 2024-05-24 MED ORDER — ONDANSETRON 4 MG PO TBDP
4.0000 mg | ORAL_TABLET | Freq: Three times a day (TID) | ORAL | Status: DC | PRN
  Filled 2024-05-24: qty 1

## 2024-05-24 MED ORDER — FENTANYL CITRATE (PF) 100 MCG/2ML IJ SOLN
50.0000 ug | INTRAMUSCULAR | Status: DC | PRN
  Administered 2024-05-24: 50 ug via INTRAVENOUS
  Administered 2024-05-25: 100 ug via INTRAVENOUS
  Administered 2024-05-25: 50 ug via INTRAVENOUS
  Administered 2024-05-27 – 2024-05-28 (×7): 100 ug via INTRAVENOUS
  Filled 2024-05-24 (×10): qty 2

## 2024-05-24 MED ORDER — PROCHLORPERAZINE EDISYLATE 10 MG/2ML IJ SOLN
10.0000 mg | Freq: Four times a day (QID) | INTRAMUSCULAR | Status: DC | PRN
  Administered 2024-05-28: 10 mg via INTRAVENOUS
  Filled 2024-05-24 (×2): qty 2

## 2024-05-24 MED ORDER — CYCLOBENZAPRINE HCL 10 MG PO TABS
5.0000 mg | ORAL_TABLET | Freq: Three times a day (TID) | ORAL | Status: DC | PRN
  Administered 2024-05-24 – 2024-05-29 (×2): 5 mg via ORAL
  Filled 2024-05-24 (×2): qty 1

## 2024-05-24 NOTE — Progress Notes (Signed)
 Daily Antepartum Note  Admission Date: 05/23/2024 Current Date: 05/24/2024 8:38 AM  Angela Branch is a 28 y.o. [id number] at [redacted]w[redacted]d, admitted for PPROM at 33/2.  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 Declined Mg for tocolysis  Subjective:  Feeling more pain and pressure.   Objective:    Current Vital Signs 24h Vital Sign Ranges  T 97.9 F (36.6 C) Temp  Avg: 98.2 F (36.8 C)  Min: 97.9 F (36.6 C)  Max: 98.8 F (37.1 C)  BP 104/63 BP  Min: 85/52  Max: 108/65  HR 81 Pulse  Avg: 88  Min: 73  Max: 104  RR 16 Resp  Avg: 17.4  Min: 16  Max: 20  SaO2 99 % Room Air SpO2  Avg: 99.2 %  Min: 98 %  Max: 100 %       24 Hour I/O Current Shift I/O  Time Ins Outs 06/01 0701 - 06/02 0700 In: 500 [P.O.:150] Out: -  No intake/output data recorded.   Patient Vitals for the past 24 hrs:  BP Temp Temp src Pulse Resp SpO2 Height Weight  05/24/24 0757 104/63 97.9 F (36.6 C) Oral 81 16 99 % -- --  05/24/24 0446 (!) 85/52 98 F (36.7 C) Oral 73 16 98 % -- --  05/23/24 2328 (!) 89/46 98.2 F (36.8 C) Oral 79 17 99 % -- --  05/23/24 2101 (!) 105/59 98 F (36.7 C) -- 87 18 -- -- --  05/23/24 1927 (!) 87/44 98.4 F (36.9 C) -- (!) 104 18 -- -- --  05/23/24 1508 106/80 98.4 F (36.9 C) Oral 88 17 100 % -- --  05/23/24 1335 108/65 -- --  89 -- -- -- --  05/23/24 1300 (!) 101/59 -- -- 96 -- -- -- --  05/23/24 1230 (!) 103/59 98.8 F (37.1 C) Oral 95 20 100 % -- --  05/23/24 1217 -- -- -- -- -- -- 5\' 3"  (1.6 m) 71 kg  Fetal Heart Tones: category I tracing with accels Tocometry: q61m  Physical exam: General: Well nourished, well developed female in no acute distress. Abdomen: gravid nttp GU: stable at 1/50/high Respiratory: no respiratory distress Extremities: no clubbing, cyanosis or edema Skin: Warm and dry.   Medications: Current Facility-Administered Medications  Medication Dose Route Frequency Provider Last Rate Last Admin   acetaminophen  (TYLENOL ) tablet 650 mg  650 mg Oral Q6H PRN Raynell Caller, MD       ampicillin  (OMNIPEN) 2 g in sodium chloride  0.9 % 100 mL IVPB  2 g Intravenous Q6H Kumar, Agnijita, MD 300 mL/hr at 05/24/24 0817 2 g at 05/24/24 0817   Followed by   Cecily Cohen ON 05/25/2024] amoxicillin  (AMOXIL ) capsule 500 mg  500 mg Oral TID Kumar, Agnijita, MD       betamethasone  acetate-betamethasone  sodium phosphate (CELESTONE ) injection 12 mg  12 mg Intramuscular Q24H Kumar, Agnijita, MD  12 mg at 05/23/24 1540   calcium  carbonate (TUMS - dosed in mg elemental calcium ) chewable tablet 400 mg of elemental calcium   2 tablet Oral Q4H PRN Kumar, Agnijita, MD       famotidine  (PEPCID ) tablet 20 mg  20 mg Oral BID Raynell Caller, MD       lactated ringers  infusion   Intravenous Continuous Raynell Caller, MD       magnesium  bolus via infusion 4 g  4 g Intravenous Once Raynell Caller, MD       magnesium  sulfate 40 grams in SWI 1000 mL OB infusion  2 g/hr Intravenous Titrated Raynell Caller, MD       metroNIDAZOLE  (FLAGYL ) tablet 500 mg  500 mg Oral BID Kumar, Agnijita, MD   500 mg at 05/23/24 2341   ondansetron  (ZOFRAN ) injection 4 mg  4 mg Intravenous Q8H PRN Raynell Caller, MD   4 mg at 05/24/24 0160   prenatal multivitamin tablet 1 tablet  1 tablet Oral Q1200 Kumar, Agnijita, MD   1 tablet at 05/23/24  1654   prochlorperazine  (COMPAZINE ) injection 10 mg  10 mg Intravenous Q6H PRN Raynell Caller, MD       sodium chloride  flush (NS) 0.9 % injection 3-10 mL  3-10 mL Intravenous Q12H Melanie Spires, MD       sodium chloride  flush (NS) 0.9 % injection 3-10 mL  3-10 mL Intravenous PRN Kumar, Agnijita, MD       valACYclovir  (VALTREX ) tablet 500 mg  500 mg Oral BID Melanie Spires, MD   500 mg at 05/23/24 1655   Labs:  Recent Labs  Lab 05/23/24 1256  WBC 9.5  HGB 11.1*  HCT 34.2*  PLT 170   Radiology:  6/1: cephalic   Assessment & Plan:  Patient stable *Pregnancy:fetal status reassuring *PPROM: continue latency abx. D/w her re: 34wk delivery *Preterm: bmz #2 at 1600 today. F/u nicu consult. Growth u/s ordered for today *h/o HSV: continue valtrex  ppx. S/p neg spec exam on admit.  *PPx: SCDs *FEN/GI: regular diet. IV PRNs for n/v of pregnancy. Pepcid  added overnight *Dispo: inpatient until delivery  Tyler Gallant MD Attending Center for [facility]) GYN Consult Phone: [phone] (M-F, 0800-1700) & [phone]  (Off hours, weekends, holidays)

## 2024-05-24 NOTE — Plan of Care (Signed)
  Problem: Education: Goal: Knowledge of disease or condition will improve Outcome: Progressing Goal: Knowledge of the prescribed therapeutic regimen will improve Outcome: Progressing Goal: Individualized Educational Video(s) Outcome: Progressing   Problem: Clinical Measurements: Goal: Complications related to the disease process, condition or treatment will be avoided or minimized Outcome: Progressing   Problem: Education: Goal: Knowledge of General Education information will improve Description: Including pain rating scale, medication(s)/side effects and non-pharmacologic comfort measures Outcome: Progressing   Problem: Health Behavior/Discharge Planning: Goal: Ability to manage health-related needs will improve Outcome: Progressing

## 2024-05-25 DIAGNOSIS — Z3A33 33 weeks gestation of pregnancy: Secondary | ICD-10-CM | POA: Diagnosis not present

## 2024-05-25 DIAGNOSIS — O42113 Preterm premature rupture of membranes, onset of labor more than 24 hours following rupture, third trimester: Secondary | ICD-10-CM

## 2024-05-25 DIAGNOSIS — O42913 Preterm premature rupture of membranes, unspecified as to length of time between rupture and onset of labor, third trimester: Principal | ICD-10-CM

## 2024-05-25 MED ORDER — ZOLPIDEM TARTRATE 5 MG PO TABS
5.0000 mg | ORAL_TABLET | Freq: Every evening | ORAL | Status: DC | PRN
  Administered 2024-05-25 – 2024-05-28 (×4): 5 mg via ORAL
  Filled 2024-05-25 (×4): qty 1

## 2024-05-25 MED ORDER — METRONIDAZOLE 500 MG/100ML IV SOLN
500.0000 mg | Freq: Two times a day (BID) | INTRAVENOUS | Status: DC
  Administered 2024-05-25 – 2024-05-27 (×4): 500 mg via INTRAVENOUS
  Filled 2024-05-25 (×4): qty 100

## 2024-05-25 NOTE — Consult Note (Signed)
 [facility]  Prenatal Consult      05/25/2024   11:19 AM  I was asked by Dr. Avie Boeck to consult on this patient for possible preterm delivery. I had the pleasure of meeting with Angela Branch today.   She is a 28yo yo [phone] female presenting now [redacted]w[redacted]d pregnant with PPROM at [redacted]w[redacted]d. Pregnancy has also been complicated by history of DMD in siblings (fetus is female), hyperemesis, liver hemangioma, history of HSV-2 (on valtrex  with negative SSE on admission). She is receiving latency antibiotics following PPROM. She has received BMZ x2 (last dose 6/2). Most recent estimated fetal weight of 2570 grams on 6/2. She has not yet decided on a name for her baby girl.   We discussed the possible needs for an infant born at this gestation. I explained that the neonatal intensive care team would be present for the delivery and outlined the likely delivery room course for this baby including routine resuscitation and NRP-guided approaches to the treatment of respiratory distress. We discussed the potential need for CPAP or mechanical ventilation and surfactant administration for respiratory distress, IV fluids pending establishment of enteral feeds, antibiotics for possible sepsis, temperature support, and monitoring.   We discussed other common problems associated with prematurity including respiratory distress and slow feeding.   We discussed the importance of good nutrition and various methods of providing nutrition (parenteral hyperalimentation, gavage feedings and/or oral feeding). We discussed the benefits of human milk. I encouraged breast feeding and pumping soon after birth and outlined resources that are available to support breast feeding. We discussed the possibility of using donor breast milk as a bridge, and she expressed the desire to use DBM if needed to supplement MBM.  We discussed the average length of stay but I noted that the actual LOS would depend on the  severity of problems encountered and response to treatments. We discussed visitation policies and the resources available while her child is in the hospital.  Mom has 5 other children at home and is concerned about potential for impaired bonding in the setting of separation from her infant. She would like to be admitted in couplet care if staffing is available. She had one other infant born at 37wks that required brief NICU admission for apnea, but was able to discharge home with her.   She expressed understanding and agreement with the plan for resuscitation and intensive care. All questions were answered.  Thank you for involving us  in the care of this patient. A member of our team will be available should the family have additional questions.   Christiann Hagerty, MD Neonatal Medicine  I spent ~40 minutes in consultation time, of which 25 minutes was spent in direct face to face counseling.

## 2024-05-25 NOTE — Plan of Care (Signed)
  Problem: Education: Goal: Knowledge of disease or condition will improve Outcome: Progressing Goal: Knowledge of the prescribed therapeutic regimen will improve Outcome: Progressing Goal: Individualized Educational Video(s) Outcome: Progressing   Problem: Clinical Measurements: Goal: Complications related to the disease process, condition or treatment will be avoided or minimized Outcome: Progressing   Problem: Education: Goal: Knowledge of General Education information will improve Description: Including pain rating scale, medication(s)/side effects and non-pharmacologic comfort measures Outcome: Progressing   Problem: Health Behavior/Discharge Planning: Goal: Ability to manage health-related needs will improve Outcome: Progressing   Problem: Clinical Measurements: Goal: Ability to maintain clinical measurements within normal limits will improve Outcome: Progressing Goal: Will remain free from infection Outcome: Progressing Goal: Diagnostic test results will improve Outcome: Progressing Goal: Respiratory complications will improve Outcome: Progressing Goal: Cardiovascular complication will be avoided Outcome: Progressing   Problem: Activity: Goal: Risk for activity intolerance will decrease Outcome: Progressing   Problem: Nutrition: Goal: Adequate nutrition will be maintained Outcome: Progressing   Problem: Coping: Goal: Level of anxiety will decrease Outcome: Progressing   Problem: Elimination: Goal: Will not experience complications related to bowel motility Outcome: Progressing Goal: Will not experience complications related to urinary retention Outcome: Progressing   Problem: Pain Managment: Goal: General experience of comfort will improve and/or be controlled Outcome: Progressing   Problem: Safety: Goal: Ability to remain free from injury will improve Outcome: Progressing   Problem: Skin Integrity: Goal: Risk for impaired skin integrity will  decrease Outcome: Progressing   Problem: Education: Goal: Knowledge of General Education information will improve Description: Including pain rating scale, medication(s)/side effects and non-pharmacologic comfort measures Outcome: Progressing   Problem: Health Behavior/Discharge Planning: Goal: Ability to manage health-related needs will improve Outcome: Progressing   Problem: Clinical Measurements: Goal: Ability to maintain clinical measurements within normal limits will improve Outcome: Progressing Goal: Will remain free from infection Outcome: Progressing Goal: Diagnostic test results will improve Outcome: Progressing Goal: Respiratory complications will improve Outcome: Progressing Goal: Cardiovascular complication will be avoided Outcome: Progressing   Problem: Activity: Goal: Risk for activity intolerance will decrease Outcome: Progressing   Problem: Nutrition: Goal: Adequate nutrition will be maintained Outcome: Progressing   Problem: Coping: Goal: Level of anxiety will decrease Outcome: Progressing   Problem: Elimination: Goal: Will not experience complications related to bowel motility Outcome: Progressing Goal: Will not experience complications related to urinary retention Outcome: Progressing   Problem: Pain Managment: Goal: General experience of comfort will improve and/or be controlled Outcome: Progressing   Problem: Safety: Goal: Ability to remain free from injury will improve Outcome: Progressing   Problem: Skin Integrity: Goal: Risk for impaired skin integrity will decrease Outcome: Progressing

## 2024-05-25 NOTE — Progress Notes (Signed)
 FACULTY PRACTICE ANTEPARTUM PROGRESS NOTE  Angela Branch is a 28 y.o. [id number] at [redacted]w[redacted]d who is admitted for PROM.  Estimated Date of Delivery: 07/09/24 Fetal presentation is cephalic.  Length of Stay:  2 Days. Admitted 05/23/2024  Subjective: Pt seen.  At time of assessment, she was relatively comfortable, but contractions had stated and she was requesting fentanyl  for pain Patient reports normal fetal movement.  She denies bleeding and is unsure regarding leaking of fluid per vagina.  Contractions were every 4-9 minutes in an irregular pattern.  Vitals:  Blood pressure (!) 96/45, pulse 76, temperature 98.1 F (36.7 C), temperature source Oral, resp. rate 18, height 5\' 3"  (1.6 m), weight 71 kg, last menstrual period 07/09/2023, SpO2 99%, currently breastfeeding. Physical Examination: CONSTITUTIONAL: Well-developed, well-nourished female in no acute distress.  HENT:  Normocephalic, atraumatic, External right and left ear normal. Oropharynx is clear and moist EYES: Conjunctivae and EOM are normal.  NECK: Normal range of motion, supple, no masses. SKIN: Skin is warm and dry. No rash noted. Not diaphoretic. No erythema. No pallor. NEUROLGIC: Alert and oriented to person, place, and time. Normal reflexes, muscle tone coordination. No cranial nerve deficit noted. PSYCHIATRIC: Normal mood and affect. Normal behavior. Normal judgment and thought content. CARDIOVASCULAR: Normal heart rate noted, regular rhythm RESPIRATORY: Effort and breath sounds normal, no problems with respiration noted MUSCULOSKELETAL: Normal range of motion. No edema and no tenderness. ABDOMEN: Soft, nontender, nondistended, gravid. CERVIX: deferred  Fetal monitoring: FHR: 130s bpm, Variability: moderate, Accelerations: Present, Decelerations: Absent  Uterine activity: q 4-6 minutes, irregular   Results for orders placed or performed during the hospital encounter of 05/23/24 (from the past 48 hours)  Urinalysis, Routine w  reflex microscopic -Urine, Clean Catch     Status: Abnormal   Collection Time: 05/23/24 12:43 PM  Result Value Ref Range   Color, Urine RED (A) YELLOW    Comment: BIOCHEMICALS MAY BE AFFECTED BY COLOR   APPearance CLEAR CLEAR   Specific Gravity, Urine 1.015 1.005 - 1.030   pH 6.0 5.0 - 8.0   Glucose, UA NEGATIVE NEGATIVE mg/dL   Hgb urine dipstick NEGATIVE NEGATIVE   Bilirubin Urine NEGATIVE NEGATIVE   Ketones, ur NEGATIVE NEGATIVE mg/dL   Protein, ur NEGATIVE NEGATIVE mg/dL   Nitrite POSITIVE (A) NEGATIVE   Leukocytes,Ua NEGATIVE NEGATIVE   RBC / HPF 0-5 0 - 5 RBC/hpf   WBC, UA 0-5 0 - 5 WBC/hpf   Bacteria, UA NONE SEEN NONE SEEN   Squamous Epithelial / HPF 6-10 0 - 5 /HPF   Mucus PRESENT     Comment: Performed at [facility], [address], [place], [place] [address]  Type and screen [facility]     Status: None   Collection Time: 05/23/24 12:56 PM  Result Value Ref Range   ABO/RH(D) A POS    Antibody Screen NEG    Sample Expiration      05/26/2024,2359 Performed at [facility], [address], [place], [place] [address]   CBC     Status: Abnormal   Collection Time: 05/23/24 12:56 PM  Result Value Ref Range   WBC 9.5 4.0 - 10.5 K/uL   RBC 3.79 (L) 3.87 - 5.11 MIL/uL   Hemoglobin 11.1 (L) 12.0 - 15.0 g/dL   HCT 84.6 (L) 96.2 - 95.2 %   MCV 90.2 80.0 - 100.0 fL   MCH 29.3 26.0 - 34.0 pg   MCHC 32.5 30.0 - 36.0 g/dL  RDW 12.8 11.5 - 15.5 %   Platelets 170 150 - 400 K/uL   nRBC 0.0 0.0 - 0.2 %    Comment: Performed at [facility], [address] [place], [place] [address]  Korene Pert Test     Status: Abnormal   Collection Time: 05/23/24  2:58 PM  Result Value Ref Range   POCT Fern Test Positive = ruptured amniotic membanes (A)     I have reviewed the patient's current medications.  ASSESSMENT: Principal Problem:   Preterm premature rupture of membranes in third trimester   PLAN: PROM Pt is on latency antibiotics currently and  is s/p BMZ x 2. She is beginning to contract more frequently.  If patient is needing another dose of pain medication or feels like she is truly beginning to labor, will reassess cervix.   If possible preterm labor, will move to L and D for expectant management If labor contractions stop and there is no cervical change, will induce labor at 34 weeks. Pt aware of plan   Continue routine antenatal care.   Avie Boeck, MD [facility] Faculty Attending, Center for [facility] 05/25/2024 11:56 AM

## 2024-05-26 ENCOUNTER — Encounter: Admitting: Family Medicine

## 2024-05-26 DIAGNOSIS — O42113 Preterm premature rupture of membranes, onset of labor more than 24 hours following rupture, third trimester: Secondary | ICD-10-CM | POA: Diagnosis not present

## 2024-05-26 DIAGNOSIS — Z3A33 33 weeks gestation of pregnancy: Secondary | ICD-10-CM

## 2024-05-26 LAB — CBC WITH DIFFERENTIAL/PLATELET
Abs Immature Granulocytes: 0.3 10*3/uL — ABNORMAL HIGH (ref 0.00–0.07)
Basophils Absolute: 0 10*3/uL (ref 0.0–0.1)
Basophils Relative: 0 %
Eosinophils Absolute: 0.1 10*3/uL (ref 0.0–0.5)
Eosinophils Relative: 1 %
HCT: 30.6 % — ABNORMAL LOW (ref 36.0–46.0)
Hemoglobin: 10.2 g/dL — ABNORMAL LOW (ref 12.0–15.0)
Immature Granulocytes: 3 %
Lymphocytes Relative: 24 %
Lymphs Abs: 2.5 10*3/uL (ref 0.7–4.0)
MCH: 30 pg (ref 26.0–34.0)
MCHC: 33.3 g/dL (ref 30.0–36.0)
MCV: 90 fL (ref 80.0–100.0)
Monocytes Absolute: 1 10*3/uL (ref 0.1–1.0)
Monocytes Relative: 9 %
Neutro Abs: 6.6 10*3/uL (ref 1.7–7.7)
Neutrophils Relative %: 63 %
Platelets: 153 10*3/uL (ref 150–400)
RBC: 3.4 MIL/uL — ABNORMAL LOW (ref 3.87–5.11)
RDW: 13.2 % (ref 11.5–15.5)
WBC: 10.5 10*3/uL (ref 4.0–10.5)
nRBC: 0 % (ref 0.0–0.2)

## 2024-05-26 LAB — COMPREHENSIVE METABOLIC PANEL WITH GFR
ALT: 9 U/L (ref 0–44)
AST: 13 U/L — ABNORMAL LOW (ref 15–41)
Albumin: 2.6 g/dL — ABNORMAL LOW (ref 3.5–5.0)
Alkaline Phosphatase: 57 U/L (ref 38–126)
Anion gap: 4 — ABNORMAL LOW (ref 5–15)
BUN: 7 mg/dL (ref 6–20)
CO2: 24 mmol/L (ref 22–32)
Calcium: 8.2 mg/dL — ABNORMAL LOW (ref 8.9–10.3)
Chloride: 108 mmol/L (ref 98–111)
Creatinine, Ser: 0.53 mg/dL (ref 0.44–1.00)
GFR, Estimated: >60 mL/min (ref >60)
Glucose, Bld: 89 mg/dL (ref 70–99)
Potassium: 3.5 mmol/L (ref 3.5–5.1)
Sodium: 136 mmol/L (ref 135–145)
Total Bilirubin: 1 mg/dL (ref 0.0–1.2)
Total Protein: 5.4 g/dL — ABNORMAL LOW (ref 6.5–8.1)

## 2024-05-26 LAB — TYPE AND SCREEN
ABO/RH(D): A POS
Antibody Screen: NEGATIVE

## 2024-05-26 MED ORDER — PANTOPRAZOLE SODIUM 40 MG PO TBEC
40.0000 mg | DELAYED_RELEASE_TABLET | Freq: Every day | ORAL | Status: DC
  Filled 2024-05-26: qty 1

## 2024-05-26 MED ORDER — METOCLOPRAMIDE HCL 5 MG/ML IJ SOLN
10.0000 mg | Freq: Four times a day (QID) | INTRAMUSCULAR | Status: DC | PRN
  Administered 2024-05-26 – 2024-05-28 (×4): 10 mg via INTRAVENOUS
  Filled 2024-05-26 (×4): qty 2

## 2024-05-26 MED ORDER — DIPHENHYDRAMINE HCL 50 MG/ML IJ SOLN
25.0000 mg | Freq: Four times a day (QID) | INTRAMUSCULAR | Status: DC | PRN
  Administered 2024-05-26 – 2024-05-28 (×4): 25 mg via INTRAVENOUS
  Filled 2024-05-26 (×4): qty 1

## 2024-05-26 NOTE — Progress Notes (Signed)
 VAST consult. Patient currently in shower. Nurse to re-consult when patient is back in bed and ready for VAST. Angela Branch  Chrystie Crass, RN VAST

## 2024-05-26 NOTE — Progress Notes (Signed)
 FACULTY PRACTICE ANTEPARTUM PROGRESS NOTE  Angela Branch is a 28 y.o. [phone] at [redacted]w[redacted]d who is admitted for PPROM.  Estimated Date of Delivery: 07/09/24 Fetal presentation is cephalic.  Length of Stay:  3 Days. Admitted 05/23/2024  Subjective: No complaints this morning.   Patient reports normal fetal movement.  She denies bleeding and is having rare episode of leaking of fluid per vagina.  No current contractions.  Vitals:  Blood pressure (!) 100/53, pulse 76, temperature 98.1 F (36.7 C), temperature source Oral, resp. rate 20, height 5\' 3"  (1.6 m), weight 71 kg, last menstrual period 07/09/2023, SpO2 100%, currently breastfeeding. Physical Examination: CONSTITUTIONAL: Well-developed, well-nourished female in no acute distress.  SKIN: Skin is warm and dry. No rash noted. Not diaphoretic. No erythema. No pallor. NEUROLGIC: Alert and oriented to person, place, and time. Normal reflexes, muscle tone coordination. No cranial nerve deficit noted. PSYCHIATRIC: Normal mood and affect. Normal behavior. Normal judgment and thought content. CARDIOVASCULAR: Normal heart rate noted, regular rhythm RESPIRATORY: Effort and breath sounds normal, no problems with respiration noted MUSCULOSKELETAL: Normal range of motion. No edema and no tenderness. ABDOMEN: Soft, nontender, nondistended, gravid. CERVIX: Deferred  Fetal monitoring: FHR: 125s bpm, Variability: moderate, Accelerations: Present, Decelerations: Absent  Uterine activity:  irritability     Latest Ref Rng & Units 05/23/2024   12:56 PM 04/26/2024    8:54 AM 01/12/2024    4:10 PM  CBC  WBC 4.0 - 10.5 K/uL 9.5  10.2  7.4   Hemoglobin 12.0 - 15.0 g/dL 13.2  44.0  10.2   Hematocrit 36.0 - 46.0 % 34.2  33.8  38.4   Platelets 150 - 400 K/uL 170  170  197    Imaging: 6/1 [redacted]w[redacted]d EFW 2570g/86%, AC 96%, AFI 16.9, CEPHALIC   I have reviewed the patient's current medications.  ASSESSMENT: Principal Problem:   Preterm premature rupture of  membranes in third trimester Active Problems:   Herpes simplex type 2 infection   [redacted] weeks gestation of pregnancy   PLAN: PROM Pt is on latency antibiotics currently and is s/p BMZ x 2. IOL scheduled at 34 weeks, formally scheduled for 05/29/24 at midnight. Patient understands that preterm labor or other maternal-fetal indications may require deliver prior to this date.  Continue Valtrex  for HSV suppression Continue routine antenatal care.   Angela Ambrosini, MD [facility] Faculty Attending, Center for [facility] 05/26/2024 7:16 AM

## 2024-05-26 NOTE — Progress Notes (Signed)
 During RN and NT rounds, patient requesting to rest until 1400. Per patient request, morning medications/NST held. Provider aware. Pharmacy aware.

## 2024-05-27 DIAGNOSIS — Z3A33 33 weeks gestation of pregnancy: Secondary | ICD-10-CM | POA: Diagnosis not present

## 2024-05-27 DIAGNOSIS — O42113 Preterm premature rupture of membranes, onset of labor more than 24 hours following rupture, third trimester: Secondary | ICD-10-CM | POA: Diagnosis not present

## 2024-05-27 LAB — CULTURE, BETA STREP (GROUP B ONLY)

## 2024-05-27 LAB — OB RESULTS CONSOLE GBS: GBS: NEGATIVE

## 2024-05-27 MED ORDER — ONDANSETRON 4 MG PO TBDP: 4.0000 mg | ORAL_TABLET | Freq: Four times a day (QID) | ORAL | Status: DC | PRN

## 2024-05-27 MED ORDER — ONDANSETRON HCL 4 MG PO TABS
4.0000 mg | ORAL_TABLET | Freq: Four times a day (QID) | ORAL | Status: DC | PRN
  Administered 2024-05-27: 4 mg via ORAL
  Filled 2024-05-27 (×2): qty 1

## 2024-05-27 MED ORDER — METRONIDAZOLE 500 MG PO TABS
500.0000 mg | ORAL_TABLET | Freq: Two times a day (BID) | ORAL | Status: DC
  Filled 2024-05-27 (×4): qty 1

## 2024-05-27 MED ORDER — ONDANSETRON HCL 4 MG/2ML IJ SOLN
4.0000 mg | Freq: Four times a day (QID) | INTRAMUSCULAR | Status: DC
  Administered 2024-05-27: 4 mg via INTRAVENOUS
  Filled 2024-05-27: qty 2

## 2024-05-27 NOTE — Plan of Care (Signed)
  Problem: Education: Goal: Knowledge of disease or condition will improve Outcome: Progressing Goal: Knowledge of the prescribed therapeutic regimen will improve Outcome: Progressing   Problem: Clinical Measurements: Goal: Will remain free from infection Outcome: Progressing   Problem: Activity: Goal: Risk for activity intolerance will decrease Outcome: Progressing   Problem: Nutrition: Goal: Adequate nutrition will be maintained Outcome: Progressing   Problem: Coping: Goal: Level of anxiety will decrease Outcome: Progressing   Problem: Elimination: Goal: Will not experience complications related to bowel motility Outcome: Progressing Goal: Will not experience complications related to urinary retention Outcome: Progressing   Problem: Safety: Goal: Ability to remain free from injury will improve Outcome: Progressing   Problem: Skin Integrity: Goal: Risk for impaired skin integrity will decrease Outcome: Progressing   Problem: Clinical Measurements: Goal: Will remain free from infection Outcome: Progressing Goal: Diagnostic test results will improve Outcome: Progressing

## 2024-05-27 NOTE — Progress Notes (Signed)
 FACULTY PRACTICE ANTEPARTUM PROGRESS NOTE  Angela Branch is a 28 y.o. [id number] at [redacted]w[redacted]d who is admitted for PPROM.  Estimated Date of Delivery: 07/09/24 Fetal presentation is cephalic.  Length of Stay:  4 Days. Admitted 05/23/2024  Subjective: States she feels pressure and cramping this morning, requesting cervical exam Patient reports normal fetal movement.  She denies bleeding and is having rare episode of leaking of fluid per vagina.  No current contractions.  Vitals:  Blood pressure (!) 99/54, pulse 84, temperature 97.6 F (36.4 C), temperature source Oral, resp. rate 17, height 5\' 3"  (1.6 m), weight 71 kg, last menstrual period 07/09/2023, SpO2 97%, currently breastfeeding. Physical Examination: CONSTITUTIONAL: Well-developed, well-nourished female in no acute distress.  SKIN: Skin is warm and dry. No rash noted. Not diaphoretic. No erythema. No pallor. NEUROLGIC: Alert and oriented to person, place, and time. Normal reflexes, muscle tone coordination. No cranial nerve deficit noted. PSYCHIATRIC: Normal mood and affect. Normal behavior. Normal judgment and thought content. CARDIOVASCULAR: Normal heart rate noted, regular rhythm RESPIRATORY: Effort and breath sounds normal, no problems with respiration noted MUSCULOSKELETAL: Normal range of motion. No edema and no tenderness. ABDOMEN: Soft, nontender, nondistended, gravid. CERVIX: 1/50/-3, exam performed with RN chaperone  Fetal monitoring: FHR: 150s bpm, Variability: moderate, Accelerations: Present, Decelerations: Absent  (6/4 PM monitoring) AM monitoring in session, reassuring but not yet reactive Uterine activity:  irritability     Latest Ref Rng & Units 05/26/2024    8:15 AM 05/23/2024   12:56 PM 04/26/2024    8:54 AM  CBC  WBC 4.0 - 10.5 K/uL 10.5  9.5  10.2   Hemoglobin 12.0 - 15.0 g/dL 57.8  46.9  62.9   Hematocrit 36.0 - 46.0 % 30.6  34.2  33.8   Platelets 150 - 400 K/uL 153  170  170    Imaging: 6/1 [redacted]w[redacted]d EFW 2570g/86%,  AC 96%, AFI 16.9, CEPHALIC   I have reviewed the patient's current medications.  ASSESSMENT: Principal Problem:   Preterm premature rupture of membranes in third trimester Active Problems:   Herpes simplex type 2 infection   [redacted] weeks gestation of pregnancy   PLAN: PROM Cervical exam unchanged today, will monitor for 1-2 hours and recheck as needed for developing symptoms prn  Pt is on latency antibiotics currently and is s/p BMZ x 2. IOL scheduled at 34 weeks, formally scheduled for 05/29/24 at midnight.  Continue Valtrex  for HSV suppression Continue routine antenatal care.   Marci Setter, MD, FACOG Obstetrician & Gynecologist, [facility], [facility]

## 2024-05-28 ENCOUNTER — Encounter (HOSPITAL_COMMUNITY): Payer: Self-pay | Admitting: Obstetrics & Gynecology

## 2024-05-28 ENCOUNTER — Inpatient Hospital Stay (HOSPITAL_COMMUNITY): Admitting: Anesthesiology

## 2024-05-28 ENCOUNTER — Encounter (HOSPITAL_COMMUNITY)
Admission: AD | Disposition: A | Discharge status: Home / Self Care | Payer: Self-pay | Source: Home / Self Care | Attending: Obstetrics and Gynecology

## 2024-05-28 ENCOUNTER — Inpatient Hospital Stay (HOSPITAL_COMMUNITY)

## 2024-05-28 ENCOUNTER — Other Ambulatory Visit: Payer: Self-pay

## 2024-05-28 DIAGNOSIS — F418 Other specified anxiety disorders: Secondary | ICD-10-CM | POA: Diagnosis not present

## 2024-05-28 DIAGNOSIS — G4733 Obstructive sleep apnea (adult) (pediatric): Secondary | ICD-10-CM

## 2024-05-28 DIAGNOSIS — O42913 Preterm premature rupture of membranes, unspecified as to length of time between rupture and onset of labor, third trimester: Secondary | ICD-10-CM | POA: Diagnosis not present

## 2024-05-28 DIAGNOSIS — Z3A33 33 weeks gestation of pregnancy: Secondary | ICD-10-CM | POA: Diagnosis not present

## 2024-05-28 DIAGNOSIS — J45909 Unspecified asthma, uncomplicated: Secondary | ICD-10-CM | POA: Diagnosis not present

## 2024-05-28 DIAGNOSIS — Z302 Encounter for sterilization: Secondary | ICD-10-CM | POA: Diagnosis not present

## 2024-05-28 DIAGNOSIS — Z3A34 34 weeks gestation of pregnancy: Secondary | ICD-10-CM | POA: Diagnosis not present

## 2024-05-28 DIAGNOSIS — O98413 Viral hepatitis complicating pregnancy, third trimester: Secondary | ICD-10-CM | POA: Diagnosis not present

## 2024-05-28 HISTORY — PX: TUBAL LIGATION: SHX77

## 2024-05-28 LAB — CBC
HCT: 32.9 % — ABNORMAL LOW (ref 36.0–46.0)
Hemoglobin: 11 g/dL — ABNORMAL LOW (ref 12.0–15.0)
MCH: 29.8 pg (ref 26.0–34.0)
MCHC: 33.4 g/dL (ref 30.0–36.0)
MCV: 89.2 fL (ref 80.0–100.0)
Platelets: 165 10*3/uL (ref 150–400)
RBC: 3.69 MIL/uL — ABNORMAL LOW (ref 3.87–5.11)
RDW: 13 % (ref 11.5–15.5)
WBC: 9.3 10*3/uL (ref 4.0–10.5)
nRBC: 0 % (ref 0.0–0.2)

## 2024-05-28 LAB — RPR: RPR Ser Ql: NONREACTIVE

## 2024-05-28 SURGERY — LIGATION, FALLOPIAN TUBE, POSTPARTUM
Anesthesia: Epidural | Laterality: Bilateral

## 2024-05-28 MED ORDER — BENZOCAINE-MENTHOL 20-0.5 % EX AERO
1.0000 | INHALATION_SPRAY | CUTANEOUS | Status: DC | PRN
  Administered 2024-05-29: 1 via TOPICAL
  Filled 2024-05-28: qty 56

## 2024-05-28 MED ORDER — PHENYLEPHRINE 80 MCG/ML (10ML) SYRINGE FOR IV PUSH (FOR BLOOD PRESSURE SUPPORT): 80.0000 ug | PREFILLED_SYRINGE | INTRAVENOUS | Status: DC | PRN

## 2024-05-28 MED ORDER — FENTANYL CITRATE (PF) 100 MCG/2ML IJ SOLN
INTRAMUSCULAR | Status: DC | PRN
  Administered 2024-05-28: 100 ug via EPIDURAL

## 2024-05-28 MED ORDER — OXYCODONE-ACETAMINOPHEN 5-325 MG PO TABS: 2.0000 | ORAL_TABLET | ORAL | Status: DC | PRN

## 2024-05-28 MED ORDER — SODIUM CHLORIDE 0.9 % IV SOLN: 5.0000 10*6.[IU] | Freq: Once | INTRAVENOUS | Status: DC

## 2024-05-28 MED ORDER — DIPHENHYDRAMINE HCL 50 MG/ML IJ SOLN: 12.5000 mg | INTRAMUSCULAR | Status: DC | PRN

## 2024-05-28 MED ORDER — LIDOCAINE-EPINEPHRINE (PF) 2 %-1:200000 IJ SOLN
INTRAMUSCULAR | Status: DC | PRN
  Administered 2024-05-28: 5 mL via EPIDURAL
  Administered 2024-05-28: 10 mL via EPIDURAL
  Administered 2024-05-28: 5 mL via EPIDURAL

## 2024-05-28 MED ORDER — FENTANYL-BUPIVACAINE-NACL 0.5-0.125-0.9 MG/250ML-% EP SOLN
EPIDURAL | Status: AC
  Filled 2024-05-28: qty 250

## 2024-05-28 MED ORDER — ACETAMINOPHEN 325 MG PO TABS: 650.0000 mg | ORAL_TABLET | ORAL | Status: DC | PRN

## 2024-05-28 MED ORDER — BUPIVACAINE HCL (PF) 0.25 % IJ SOLN
INTRAMUSCULAR | Status: AC
  Filled 2024-05-28: qty 10

## 2024-05-28 MED ORDER — OXYTOCIN-SODIUM CHLORIDE 30-0.9 UT/500ML-% IV SOLN
1.0000 m[IU]/min | INTRAVENOUS | Status: DC
  Administered 2024-05-28: 2 m[IU]/min via INTRAVENOUS
  Filled 2024-05-28: qty 500

## 2024-05-28 MED ORDER — OXYCODONE HCL 5 MG PO TABS
5.0000 mg | ORAL_TABLET | ORAL | Status: DC | PRN
  Administered 2024-05-30 (×2): 5 mg via ORAL
  Filled 2024-05-28 (×2): qty 1

## 2024-05-28 MED ORDER — OXYCODONE-ACETAMINOPHEN 5-325 MG PO TABS: 1.0000 | ORAL_TABLET | ORAL | Status: DC | PRN

## 2024-05-28 MED ORDER — LACTATED RINGERS IV SOLN: INTRAVENOUS | Status: DC

## 2024-05-28 MED ORDER — ONDANSETRON HCL 4 MG/2ML IJ SOLN
INTRAMUSCULAR | Status: AC
  Filled 2024-05-28: qty 2

## 2024-05-28 MED ORDER — LIDOCAINE HCL (PF) 1 % IJ SOLN
30.0000 mL | INTRAMUSCULAR | Status: DC | PRN
Start: 2024-05-28 — End: 2024-05-28

## 2024-05-28 MED ORDER — PRENATAL MULTIVITAMIN CH
1.0000 | ORAL_TABLET | Freq: Every day | ORAL | Status: DC
  Administered 2024-05-29: 1 via ORAL
  Filled 2024-05-28: qty 1

## 2024-05-28 MED ORDER — OXYTOCIN BOLUS FROM INFUSION
333.0000 mL | Freq: Once | INTRAVENOUS | Status: AC
  Administered 2024-05-28: 333 mL via INTRAVENOUS

## 2024-05-28 MED ORDER — DIBUCAINE (PERIANAL) 1 % EX OINT
1.0000 | TOPICAL_OINTMENT | CUTANEOUS | Status: DC | PRN
Start: 2024-05-28 — End: 2024-05-30

## 2024-05-28 MED ORDER — DIPHENHYDRAMINE HCL 25 MG PO CAPS: 25.0000 mg | ORAL_CAPSULE | Freq: Four times a day (QID) | ORAL | Status: DC | PRN

## 2024-05-28 MED ORDER — DEXMEDETOMIDINE HCL IN NACL 80 MCG/20ML IV SOLN
INTRAVENOUS | Status: DC | PRN
  Administered 2024-05-28 (×2): 10 ug via INTRAVENOUS

## 2024-05-28 MED ORDER — ONDANSETRON HCL 4 MG/2ML IJ SOLN
INTRAMUSCULAR | Status: DC | PRN
  Administered 2024-05-28: 4 mg via INTRAVENOUS

## 2024-05-28 MED ORDER — OXYTOCIN-SODIUM CHLORIDE 30-0.9 UT/500ML-% IV SOLN
2.5000 [IU]/h | INTRAVENOUS | Status: DC
  Administered 2024-05-28: 2.5 [IU]/h via INTRAVENOUS

## 2024-05-28 MED ORDER — TERBUTALINE SULFATE 1 MG/ML IJ SOLN: 0.2500 mg | Freq: Once | INTRAMUSCULAR | Status: DC | PRN

## 2024-05-28 MED ORDER — IBUPROFEN 600 MG PO TABS
600.0000 mg | ORAL_TABLET | Freq: Four times a day (QID) | ORAL | Status: DC
Start: 2024-05-29 — End: 2024-05-30
  Administered 2024-05-29 – 2024-05-30 (×3): 600 mg via ORAL
  Filled 2024-05-28 (×4): qty 1

## 2024-05-28 MED ORDER — ACETAMINOPHEN 10 MG/ML IV SOLN
INTRAVENOUS | Status: DC | PRN
  Administered 2024-05-28: 1000 mg via INTRAVENOUS

## 2024-05-28 MED ORDER — MIDAZOLAM HCL 2 MG/2ML IJ SOLN
INTRAMUSCULAR | Status: AC
  Filled 2024-05-28: qty 2

## 2024-05-28 MED ORDER — LIDOCAINE HCL (PF) 1 % IJ SOLN
INTRAMUSCULAR | Status: DC | PRN
Start: 2024-05-28 — End: 2024-05-28
  Administered 2024-05-28: 10 mL via EPIDURAL

## 2024-05-28 MED ORDER — COCONUT OIL OIL
1.0000 | TOPICAL_OIL | Status: DC | PRN
  Administered 2024-05-29: 1 via TOPICAL

## 2024-05-28 MED ORDER — ONDANSETRON HCL 4 MG/2ML IJ SOLN: 4.0000 mg | INTRAMUSCULAR | Status: DC | PRN

## 2024-05-28 MED ORDER — LACTATED RINGERS IV SOLN: INTRAVENOUS | Status: DC | PRN

## 2024-05-28 MED ORDER — STERILE WATER FOR IRRIGATION IR SOLN
Status: DC | PRN
  Administered 2024-05-28: 1

## 2024-05-28 MED ORDER — LACTATED RINGERS IV SOLN: 500.0000 mL | Freq: Once | INTRAVENOUS | Status: DC

## 2024-05-28 MED ORDER — SOD CITRATE-CITRIC ACID 500-334 MG/5ML PO SOLN
30.0000 mL | ORAL | Status: DC | PRN
  Administered 2024-05-28: 30 mL via ORAL
  Filled 2024-05-28: qty 30

## 2024-05-28 MED ORDER — SIMETHICONE 80 MG PO CHEW
80.0000 mg | CHEWABLE_TABLET | ORAL | Status: DC | PRN
  Administered 2024-05-29: 80 mg via ORAL
  Filled 2024-05-28: qty 1

## 2024-05-28 MED ORDER — FENTANYL CITRATE (PF) 100 MCG/2ML IJ SOLN
INTRAMUSCULAR | Status: AC
  Filled 2024-05-28: qty 2

## 2024-05-28 MED ORDER — OXYCODONE HCL 5 MG PO TABS
10.0000 mg | ORAL_TABLET | ORAL | Status: DC | PRN
  Administered 2024-05-29 (×5): 10 mg via ORAL
  Filled 2024-05-28 (×5): qty 2

## 2024-05-28 MED ORDER — ONDANSETRON HCL 4 MG PO TABS
4.0000 mg | ORAL_TABLET | ORAL | Status: DC | PRN
  Administered 2024-05-30: 4 mg via ORAL
  Filled 2024-05-28: qty 1

## 2024-05-28 MED ORDER — FENTANYL-BUPIVACAINE-NACL 0.5-0.125-0.9 MG/250ML-% EP SOLN: 12.0000 mL/h | EPIDURAL | Status: DC | PRN

## 2024-05-28 MED ORDER — PENICILLIN G POT IN DEXTROSE 60000 UNIT/ML IV SOLN: 3.0000 10*6.[IU] | INTRAVENOUS | Status: DC

## 2024-05-28 MED ORDER — FENTANYL CITRATE (PF) 100 MCG/2ML IJ SOLN: 50.0000 ug | INTRAMUSCULAR | Status: DC | PRN

## 2024-05-28 MED ORDER — WITCH HAZEL-GLYCERIN EX PADS: 1.0000 | MEDICATED_PAD | CUTANEOUS | Status: DC | PRN

## 2024-05-28 MED ORDER — DEXMEDETOMIDINE HCL IN NACL 80 MCG/20ML IV SOLN
INTRAVENOUS | Status: AC
  Filled 2024-05-28: qty 20

## 2024-05-28 MED ORDER — LIDOCAINE-EPINEPHRINE (PF) 2 %-1:200000 IJ SOLN
INTRAMUSCULAR | Status: AC
Start: 2024-05-28 — End: ?
  Filled 2024-05-28: qty 20

## 2024-05-28 MED ORDER — SENNOSIDES-DOCUSATE SODIUM 8.6-50 MG PO TABS
2.0000 | ORAL_TABLET | Freq: Every day | ORAL | Status: DC
  Administered 2024-05-29 – 2024-05-30 (×2): 2 via ORAL
  Filled 2024-05-28 (×2): qty 2

## 2024-05-28 MED ORDER — ACETAMINOPHEN 325 MG PO TABS
650.0000 mg | ORAL_TABLET | ORAL | Status: DC | PRN
  Administered 2024-05-29 – 2024-05-30 (×3): 650 mg via ORAL
  Filled 2024-05-28 (×3): qty 2

## 2024-05-28 MED ORDER — BUPIVACAINE HCL (PF) 0.25 % IJ SOLN
INTRAMUSCULAR | Status: DC | PRN
  Administered 2024-05-28: 15 mL

## 2024-05-28 MED ORDER — MIDAZOLAM HCL 5 MG/5ML IJ SOLN
INTRAMUSCULAR | Status: DC | PRN
  Administered 2024-05-28 (×2): 2 mg via INTRAVENOUS

## 2024-05-28 MED ORDER — EPHEDRINE 5 MG/ML INJ: 10.0000 mg | INTRAVENOUS | Status: DC | PRN

## 2024-05-28 MED ORDER — ONDANSETRON HCL 4 MG/2ML IJ SOLN
4.0000 mg | Freq: Four times a day (QID) | INTRAMUSCULAR | Status: DC | PRN
  Administered 2024-05-28: 4 mg via INTRAVENOUS
  Filled 2024-05-28: qty 2

## 2024-05-28 MED ORDER — LACTATED RINGERS IV SOLN: 500.0000 mL | INTRAVENOUS | Status: DC | PRN

## 2024-05-28 MED ORDER — EPHEDRINE 5 MG/ML INJ
10.0000 mg | INTRAVENOUS | Status: DC | PRN
Start: 2024-05-28 — End: 2024-05-28

## 2024-05-28 SURGICAL SUPPLY — 19 items
CLOTH BEACON ORANGE TIMEOUT ST (SAFETY) ×2 IMPLANT
DRSG OPSITE POSTOP 3X4 (GAUZE/BANDAGES/DRESSINGS) ×2 IMPLANT
DURAPREP 26ML APPLICATOR (WOUND CARE) ×2 IMPLANT
GLOVE BIOGEL PI IND STRL 7.0 (GLOVE) ×4 IMPLANT
GLOVE ECLIPSE 7.0 STRL STRAW (GLOVE) ×2 IMPLANT
GOWN STRL REUS W/TWL LRG LVL3 (GOWN DISPOSABLE) ×4 IMPLANT
MAT PREVALON FULL STRYKER (MISCELLANEOUS) IMPLANT
NEEDLE HYPO 22GX1.5 SAFETY (NEEDLE) ×2 IMPLANT
NS IRRIG 1000ML POUR BTL (IV SOLUTION) ×2 IMPLANT
PACK ABDOMINAL MINOR (CUSTOM PROCEDURE TRAY) ×2 IMPLANT
PROTECTOR NERVE ULNAR (MISCELLANEOUS) ×2 IMPLANT
SPONGE LAP 4X18 RFD (DISPOSABLE) ×2 IMPLANT
SUT MON AB 2-0 SH27 (SUTURE) IMPLANT
SUT VIC AB 0 CT1 27XBRD ANBCTR (SUTURE) ×2 IMPLANT
SUT VICRYL 4-0 PS2 18IN ABS (SUTURE) ×2 IMPLANT
SYR CONTROL 10ML LL (SYRINGE) ×2 IMPLANT
TOWEL OR 17X24 6PK STRL BLUE (TOWEL DISPOSABLE) ×4 IMPLANT
TRAY FOLEY CATH SILVER 14FR (SET/KITS/TRAYS/PACK) ×2 IMPLANT
WATER STERILE IRR 1000ML POUR (IV SOLUTION) ×2 IMPLANT

## 2024-05-28 NOTE — Progress Notes (Signed)
 Patient ID: Angela Branch, female   DOB: Apr 25, 1996, 28 y.o.   MRN: [id number]  BP 104/77   Pulse 72   Temp 97.7 F (36.5 C) (Oral)   Resp 16   Ht 5\' 3"  (1.6 m)   Wt 156 lb 8 oz (71 kg)   LMP 07/09/2023 (Approximate) Comment: unsure of date  SpO2 97%   Breastfeeding Unknown   BMI 27.72 kg/m    Called to room by RN, pt requesting AROM due to intensity of labor. Cat 1 tracing. AROM completed of forebag with clear return of fluid. Pt 5/100/-1 with head well applied to cervix. Pt requesting epidural, warned that she may be progressing quickly. Anticipate SVD shortly.  Lemuel Quaker, CNM, MSN, IBCLC Certified Nurse Midwife, [facility]

## 2024-05-28 NOTE — Transfer of Care (Signed)
 Immediate Anesthesia Transfer of Care Note  Patient: Angela Branch  Procedure(s) Performed: LIGATION, FALLOPIAN TUBE, POSTPARTUM (Bilateral)  Patient Location: PACU  Anesthesia Type:Epidural  Level of Consciousness: awake  Airway & Oxygen Therapy: Patient Spontanous Breathing  Post-op Assessment: Report given to RN and Post -op Vital signs reviewed and stable  Post vital signs: Reviewed and stable  Last Vitals:  Vitals Value Taken Time  BP    Temp    Pulse    Resp    SpO2      Last Pain:  Vitals:   05/28/24 1950  TempSrc:   PainSc: 5       Patients Stated Pain Goal: 3 (05/26/24 0824)  Complications: No notable events documented.

## 2024-05-28 NOTE — Anesthesia Preprocedure Evaluation (Addendum)
 Anesthesia Evaluation  Patient identified by MRN, date of birth, ID band Patient awake    Reviewed: Allergy & Precautions, NPO status , Patient's Chart, lab work & pertinent test results  Airway Mallampati: II  TM Distance: >3 FB Neck ROM: Full    Dental no notable dental hx.    Pulmonary asthma , sleep apnea    Pulmonary exam normal breath sounds clear to auscultation       Cardiovascular negative cardio ROS Normal cardiovascular exam Rhythm:Regular Rate:Normal     Neuro/Psych  Headaches PSYCHIATRIC DISORDERS Anxiety Depression       GI/Hepatic ,GERD  ,,(+)     substance abuse  marijuana use  Endo/Other  negative endocrine ROS    Renal/GU negative Renal ROS  negative genitourinary   Musculoskeletal negative musculoskeletal ROS (+)    Abdominal   Peds  Hematology negative hematology ROS (+)   Anesthesia Other Findings PPROM, [redacted] weeks gestation  Reproductive/Obstetrics (+) Pregnancy                             Anesthesia Physical Anesthesia Plan  ASA: 3  Anesthesia Plan: Epidural   Post-op Pain Management:    Induction:   PONV Risk Score and Plan: Treatment may vary due to age or medical condition  Airway Management Planned: Natural Airway  Additional Equipment:   Intra-op Plan:   Post-operative Plan:   Informed Consent: I have reviewed the patients History and Physical, chart, labs and discussed the procedure including the risks, benefits and alternatives for the proposed anesthesia with the patient or authorized representative who has indicated his/her understanding and acceptance.       Plan Discussed with: Anesthesiologist  Anesthesia Plan Comments: (Patient identified. Risks, benefits, options discussed with patient including but not limited to bleeding, infection, nerve damage, paralysis, failed block, incomplete pain control, headache, blood pressure changes,  nausea, vomiting, reactions to medication, itching, and post partum back pain. Confirmed with bedside nurse the patient's most recent platelet count. Confirmed with the patient that they are not taking any anticoagulation, have any bleeding history or any family history of bleeding disorders. Patient expressed understanding and wishes to proceed. All questions were answered. )       Anesthesia Quick Evaluation

## 2024-05-28 NOTE — Progress Notes (Signed)
 Patient ID: Angela Branch, female   DOB: 07-Oct-1996, 28 y.o.   MRN: [id number] Patient counseled, r.e. Risks benefits of BTL, including permanency of procedure, discussed benefits of salpingectomy including almost no failure and prevention of ovarian cancer.  Patient verbalized understanding and desires to proceed

## 2024-05-28 NOTE — Progress Notes (Signed)
 LABOR PROGRESS NOTE  Patient Name: Angela Branch, female   DOB: September 13, 1996, 28 y.o.  MRN: [id number]  Pt brought up for scheduled 34 week IOL following PPROM.  Cook placed with 80cc uterine, 0cc.  Patient tolerated well.   Ebony Goldstein, MD

## 2024-05-28 NOTE — Anesthesia Procedure Notes (Signed)
 Epidural Patient location during procedure: OB Start time: 05/28/2024 6:20 PM End time: 05/28/2024 6:30 PM  Staffing Anesthesiologist: Grace Laura, MD Performed: anesthesiologist   Preanesthetic Checklist Completed: patient identified, IV checked, risks and benefits discussed, monitors and equipment checked, pre-op evaluation and timeout performed  Epidural Patient position: sitting Prep: DuraPrep and site prepped and draped Patient monitoring: continuous pulse ox, blood pressure, heart rate and cardiac monitor Approach: midline Location: L3-L4 Injection technique: LOR air  Needle:  Needle type: Tuohy  Needle gauge: 17 G Needle length: 9 cm Needle insertion depth: 5 cm Catheter type: closed end flexible Catheter size: 19 Gauge Catheter at skin depth: 10 cm Test dose: negative  Assessment Sensory level: T8 Events: blood not aspirated, no cerebrospinal fluid, injection not painful, no injection resistance, no paresthesia and negative IV test  Additional Notes Patient identified. Risks/Benefits/Options discussed with patient including but not limited to bleeding, infection, nerve damage, paralysis, failed block, incomplete pain control, headache, blood pressure changes, nausea, vomiting, reactions to medication both or allergic, itching and postpartum back pain. Confirmed with bedside nurse the patient's most recent platelet count. Confirmed with patient that they are not currently taking any anticoagulation, have any bleeding history or any family history of bleeding disorders. Patient expressed understanding and wished to proceed. All questions were answered. Sterile technique was used throughout the entire procedure. Please see nursing notes for vital signs. Test dose was given through epidural catheter and negative prior to continuing to dose epidural or start infusion. Warning signs of high block given to the patient including shortness of breath, tingling/numbness in hands,  complete motor block, or any concerning symptoms with instructions to call for help. Patient was given instructions on fall risk and not to get out of bed. All questions and concerns addressed with instructions to call with any issues or inadequate analgesia.  Reason for block:procedure for pain

## 2024-05-28 NOTE — Progress Notes (Signed)
 Patient ID: Angela Branch, female   DOB: 19-Sep-1996, 28 y.o.   MRN: [id number]  BP 99/61   Pulse 72   Temp (!) 97.5 F (36.4 C) (Oral)   Resp 16   Ht 5\' 3"  (1.6 m)   Wt 156 lb 8 oz (71 kg)   LMP 07/09/2023 (Approximate) Comment: unsure of date  SpO2 100%   BMI 27.72 kg/m    Dilation: 2.5 Effacement (%): 80 Cervical Position: Posterior Station: -2 Presentation: Vertex Exam by:: Otten, RN   Pt reported much pain with 80cc Cook's so RN removed, Pitocin  infusion started, pt ambulating and tolerating it well. Will assess for AROM at next check.  Angela Branch, CNM, MSN, IBCLC Certified Nurse Midwife, [facility]

## 2024-05-28 NOTE — Anesthesia Preprocedure Evaluation (Addendum)
 Anesthesia Evaluation  Patient identified by MRN, date of birth, ID band Patient awake    Reviewed: Allergy & Precautions, NPO status , Patient's Chart, lab work & pertinent test results  Airway Mallampati: II  TM Distance: >3 FB Neck ROM: Full    Dental no notable dental hx.    Pulmonary asthma , sleep apnea    Pulmonary exam normal breath sounds clear to auscultation       Cardiovascular negative cardio ROS Normal cardiovascular exam Rhythm:Regular Rate:Normal     Neuro/Psych  Headaches PSYCHIATRIC DISORDERS Anxiety Depression       GI/Hepatic ,GERD  ,,(+)     substance abuse  marijuana use  Endo/Other  negative endocrine ROS    Renal/GU negative Renal ROS  negative genitourinary   Musculoskeletal negative musculoskeletal ROS (+)    Abdominal   Peds  Hematology negative hematology ROS (+)   Anesthesia Other Findings Presents for PPTL. NPO appropriate. Has epidural in place.   Reproductive/Obstetrics                             Anesthesia Physical Anesthesia Plan  ASA: 3  Anesthesia Plan: Epidural   Post-op Pain Management:    Induction:   PONV Risk Score and Plan: Treatment may vary due to age or medical condition  Airway Management Planned: Natural Airway  Additional Equipment:   Intra-op Plan:   Post-operative Plan:   Informed Consent: I have reviewed the patients History and Physical, chart, labs and discussed the procedure including the risks, benefits and alternatives for the proposed anesthesia with the patient or authorized representative who has indicated his/her understanding and acceptance.       Plan Discussed with: Anesthesiologist  Anesthesia Plan Comments: (Patient identified. Risks, benefits, options discussed with patient including but not limited to bleeding, infection, nerve damage, paralysis, failed block, incomplete pain control, headache, blood  pressure changes, nausea, vomiting, reactions to medication, itching, and post partum back pain. Confirmed with bedside nurse the patient's most recent platelet count. Confirmed with the patient that they are not taking any anticoagulation, have any bleeding history or any family history of bleeding disorders. Patient expressed understanding and wishes to proceed. All questions were answered.   Plan to use pre-existing epidural. )       Anesthesia Quick Evaluation

## 2024-05-28 NOTE — Discharge Summary (Signed)
 Postpartum Discharge Summary     Patient Name: Angela Branch DOB: Jul 30, 1996 MRN: [id number]  Date of admission: 05/23/2024 Delivery date:05/28/2024 Delivering provider: Lemuel Quaker R Date of discharge: 05/30/2024  Admitting diagnosis: Preterm premature rupture of membranes in third trimester [O42.913] Intrauterine pregnancy: [redacted]w[redacted]d     Secondary diagnosis:  Principal Problem:   Preterm premature rupture of membranes in third trimester Active Problems:   Herpes simplex type 2 infection   [redacted] weeks gestation of pregnancy  Additional problems: None    Discharge diagnosis: Preterm Pregnancy Delivered and PPROM                                              Post partum procedures:BTS Augmentation: AROM, Pitocin , and IP Foley Complications: None  Hospital course: Induction of Labor With Vaginal Delivery   28 y.o. yo [id number] at [redacted]w[redacted]d was admitted to the hospital 05/23/2024 for induction of labor.  Indication for induction: PROM.  Patient had an labor course complicated by none. Membrane Rupture Time/Date: 2:40 AM,05/23/2024  Delivery Method:Vaginal, Spontaneous Operative Delivery:N/A Episiotomy: None Lacerations:  None Details of delivery can be found in separate delivery note.  Patient had a postpartum course complicated by nothing. Had pp BTL following delivery. Had some pain, but was doing well after this. Patient is discharged home 05/30/24.  Newborn Data: Birth date:05/28/2024 Birth time:6:28 PM Gender:Female Living status:Living Apgars:8 ,  Weight:2510 g  Magnesium  Sulfate received: Yes: Neuroprotection BMZ received: Yes Rhophylac:N/A MMR:N/A T-DaP:Offered postpartum Flu: N/A RSV Vaccine received: No Transfusion:No  Immunizations received: Immunization History  Administered Date(s) Administered   DTaP 12/20/1997, 04/05/1998, 06/13/1998, 12/14/1998   DTaP / HiB 12/20/1997, 02/22/1998, 05/02/1998, 10/23/1998   H1N1 10/25/2008   HIB (PRP-OMP) 12/20/1997, 02/22/1998,  05/02/1998, 10/23/1998   HIB, Unspecified 12/20/1997, 02/22/1998, 05/02/1998, 10/23/1998   HPV Quadrivalent 03/27/2007, 12/29/2008, 04/10/2009   Hepatitis A 10/25/2008, 11/02/2010   Hepatitis A, Ped/Adol-2 Dose 10/25/2008, 11/02/2010   Hepatitis B 1997-11-09, 11/21/1997, 08/02/1998   Hepatitis B, PED/ADOLESCENT 10-31-97, 11/21/1997, 08/02/1998   IPV 12/20/1997, 03/22/1998, 10/23/1998, 10/25/2008   Influenza Nasal 11/02/2010   Influenza Split 10/25/2008   Influenza, Seasonal, Injecte, Preservative Fre 10/25/2008, 08/14/2015   Influenza,Quad,Nasal, Live 11/02/2010   Influenza,inj,Quad PF,6+ Mos 11/02/2010, 08/14/2015   Influenza-Unspecified 08/05/2018, 07/29/2019   MMR 10/23/1998, 08/10/2007   Meningococcal Acwy, Unspecified 04/10/2009   Meningococcal Conjugate 04/10/2009   Meningococcal polysaccharide vaccine (MPSV4) 04/10/2009   Td 12/29/2008   Tdap 12/29/2008, 09/04/2015, 07/21/2018, 03/09/2021   Varicella 10/23/1998, 08/10/2007    Physical exam  Vitals:   05/29/24 1217 05/29/24 1609 05/29/24 2025 05/30/24 0441  BP: (!) 88/45 100/62 106/64 (!) 96/50  Pulse: 69 84 75 78  Resp: 19 18 16 16   Temp: 97.8 F (36.6 C) 98.7 F (37.1 C) 98.4 F (36.9 C) 98 F (36.7 C)  TempSrc: Oral Oral Oral Oral  SpO2: 100% 99% 99% 99%  Weight:      Height:       General: alert, cooperative, and no distress Lochia: appropriate Uterine Fundus: firm Incision: Healing well with no significant drainage DVT Evaluation: No evidence of DVT seen on physical exam. Labs: Lab Results  Component Value Date   WBC 13.5 (H) 05/29/2024   HGB 11.6 (L) 05/29/2024   HCT 34.6 (L) 05/29/2024   MCV 88.5 05/29/2024   PLT 174 05/29/2024      Latest  Ref Rng & Units 05/29/2024    7:27 AM  CMP  Glucose 70 - 99 mg/dL 97   BUN 6 - 20 mg/dL <5   Creatinine 1.61 - 1.00 mg/dL 0.96   Sodium 045 - 409 mmol/L 135   Potassium 3.5 - 5.1 mmol/L 4.0   Chloride 98 - 111 mmol/L 107   CO2 22 - 32 mmol/L 23   Calcium   8.9 - 10.3 mg/dL 8.4   Total Protein 6.5 - 8.1 g/dL 5.1   Total Bilirubin 0.0 - 1.2 mg/dL 1.3   Alkaline Phos 38 - 126 U/L 61   AST 15 - 41 U/L 16   ALT 0 - 44 U/L 8    Edinburgh Score:    05/29/2024    4:00 PM  Edinburgh Postnatal Depression Scale Screening Tool  I have been able to laugh and see the funny side of things. 0  I have looked forward with enjoyment to things. 0  I have blamed myself unnecessarily when things went wrong. 0  I have been anxious or worried for no good reason. 0  I have felt scared or panicky for no good reason. 0  Things have been getting on top of me. 0  I have been so unhappy that I have had difficulty sleeping. 0  I have felt sad or miserable. 0  I have been so unhappy that I have been crying. 0  The thought of harming myself has occurred to me. 0  Edinburgh Postnatal Depression Scale Total 0   Edinburgh Postnatal Depression Scale Total: 0   After visit meds:  Allergies as of 05/30/2024   No Active Allergies      Medication List     STOP taking these medications    butalbital -acetaminophen -caffeine  50-325-40 MG tablet Commonly known as: FIORICET    fluticasone  50 MCG/ACT nasal spray Commonly known as: FLONASE    glycopyrrolate  1 MG tablet Commonly known as: Robinul    metoCLOPramide  10 MG tablet Commonly known as: Reglan    metroNIDAZOLE  0.75 % vaginal gel Commonly known as: METROGEL    metroNIDAZOLE  500 MG tablet Commonly known as: FLAGYL    ondansetron  4 MG disintegrating tablet Commonly known as: ZOFRAN -ODT   ondansetron  4 MG tablet Commonly known as: ZOFRAN    promethazine  25 MG tablet Commonly known as: PHENERGAN        TAKE these medications    acetaminophen  500 MG tablet Commonly known as: TYLENOL  Take 1,000 mg by mouth every 6 (six) hours as needed.   HYDROmorphone  2 MG tablet Commonly known as: Dilaudid  Take 0.5 tablets (1 mg total) by mouth every 4 (four) hours as needed for severe pain (pain score 7-10).    ibuprofen  600 MG tablet Commonly known as: ADVIL  Take 1 tablet (600 mg total) by mouth every 6 (six) hours.   Ventolin  HFA 108 (90 Base) MCG/ACT inhaler Generic drug: albuterol  Inhale 1 puff into the lungs every 6 (six) hours as needed (prn).         Discharge home in stable condition Infant Feeding: Breast Infant Disposition:NICU Discharge instruction: per After Visit Summary and Postpartum booklet. Activity: Advance as tolerated. Pelvic rest for 6 weeks.  Diet: routine diet Future Appointments:No future appointments. Follow up Visit:  Follow-up Information     [facility] Follow up in 4 day(s).   Specialty: Obstetrics and Gynecology Why: pp check Contact information: [address] [place]  [address] [phone]  Message sent to [facility] on 05/30/24 by Lemuel Quaker, CNM  Please schedule this patient for a In person postpartum visit in 6 weeks with the following provider: Lemuel Quaker, CNM. Additional Postpartum F/U:None  Low risk pregnancy complicated by: PPROM Delivery mode:  Vaginal, Spontaneous Anticipated Birth Control:  BTL done [facility]   05/30/2024 Granville Layer, MD

## 2024-05-28 NOTE — Progress Notes (Signed)
 Patient ID: Angela Branch, female   DOB: May 11, 1996, 28 y.o.   MRN: [id number]  BP (!) 109/58   Pulse 73   Temp 97.7 F (36.5 C) (Oral)   Resp 16   Ht 5\' 3"  (1.6 m)   Wt 156 lb 8 oz (71 kg)   LMP 07/09/2023 (Approximate) Comment: unsure of date  SpO2 100%   BMI 27.72 kg/m    Dilation: 4.5 Effacement (%): 90 Cervical Position: Posterior Station: -1 Presentation: Vertex Exam by:: Otten, RN   Pt has been ambulating in the halls, starting to feel contractions much stronger. Pitocin  at 40mu/min. Coping well with contractions, planning to avoid epidural. Using IV fentanyl  for pain relief. Forebag noted on last exam, will AROM when pt's partner gets back. Cat 1 tracing.  Lemuel Quaker, CNM, MSN, IBCLC Certified Nurse Midwife, [facility]

## 2024-05-28 NOTE — Op Note (Signed)
 Esta Heidelberg 05/23/2024 - 05/28/2024  PREOPERATIVE DIAGNOSES: Multiparity, undesired fertility  POSTOPERATIVE DIAGNOSES: Multiparity, undesired fertility  PROCEDURE:  Postpartum Bilateral Partial Salpingectomy  SURGEON: Dr. Adriana Hopping Assist: Candice Chalet, MD An experienced assistant was required given the standard of surgical care given the complexity of the case.  This assistant was needed for exposure, dissection, suctioning, retraction, instrument exchange, and for overall help during the procedure.  ANESTHESIA:  Epidural and local analgesia using 15 ml of 0.5% Marcaine   COMPLICATIONS:  None immediate.  ESTIMATED BLOOD LOSS: <20cc   INDICATIONS:  28 y.o. [id number] with undesired fertility, status post vaginal delivery, desires permanent sterilization.  Other reversible forms of contraception were discussed with patient; she declines all other modalities. Risks of procedure discussed with patient including but not limited to: risk of regret, permanence of method, bleeding, infection, injury to surrounding organs and need for additional procedures.  Failure risk of 1% with increased risk of ectopic gestation if pregnancy occurs was also discussed with patient.      FINDINGS:  Normal uterus, tubes, and ovaries.  PROCEDURE DETAILS: The patient was taken to the operating room where her epidural anesthesia was dosed up to surgical level and found to be adequate.  She was then placed in the dorsal supine position and prepped and draped in sterile fashion.  After an adequate timeout was performed, attention was turned to the patient's abdomen where a small transverse skin incision was made under the umbilical fold. The incision was taken down to the layer of fascia using the scalpel, and fascia was incised, and extended bilaterally using scalpel. The peritoneum was entered in a sharp fashion. Attention was then turned to the patient's uterus, the left fallopian tube was grasped with Babcock clamps and  followed out to the fimbriated end. The distal 5 cm portion of the tube and mesosalpinx was doubly clamped using Kelly clamps and the distal portion of the tube removed using Metzenbaum scissors. The remaining pedicle was tied off using 2-0 Vicryl and a subsequent suture ligation with 2-0 Vicryl using Heaney stitch technique. The pedicle was inspected and found to be hemostatic.  A similar process was carried out on the right side allowing for bilateral tubal sterilization.  Good hemostasis was noted overall. The instruments were then removed from the patient's abdomen and the fascial incision was repaired with 0 Vicryl, and the skin was closed with a 4-0 Vicryl subcuticular stitch. The patient tolerated the procedure well.  Instrument, sponge, and needle counts were correct times two.  The patient was then taken to the recovery room awake and in stable condition.   Ebony Goldstein, MD FMOB Fellow, Faculty practice [facility], [facility] for [facility]

## 2024-05-29 ENCOUNTER — Encounter (HOSPITAL_COMMUNITY): Payer: Self-pay | Admitting: Family Medicine

## 2024-05-29 ENCOUNTER — Inpatient Hospital Stay (HOSPITAL_COMMUNITY)

## 2024-05-29 ENCOUNTER — Inpatient Hospital Stay (HOSPITAL_COMMUNITY): Admission: RE | Admit: 2024-05-29 | Source: Home / Self Care | Admitting: Obstetrics and Gynecology

## 2024-05-29 LAB — COMPREHENSIVE METABOLIC PANEL WITH GFR
ALT: 8 U/L (ref 0–44)
AST: 16 U/L (ref 15–41)
Albumin: 2.5 g/dL — ABNORMAL LOW (ref 3.5–5.0)
Alkaline Phosphatase: 61 U/L (ref 38–126)
Anion gap: 5 (ref 5–15)
BUN: <5 mg/dL — ABNORMAL LOW (ref 6–20)
CO2: 23 mmol/L (ref 22–32)
Calcium: 8.4 mg/dL — ABNORMAL LOW (ref 8.9–10.3)
Chloride: 107 mmol/L (ref 98–111)
Creatinine, Ser: 0.62 mg/dL (ref 0.44–1.00)
GFR, Estimated: >60 mL/min (ref >60)
Glucose, Bld: 97 mg/dL (ref 70–99)
Potassium: 4 mmol/L (ref 3.5–5.1)
Sodium: 135 mmol/L (ref 135–145)
Total Bilirubin: 1.3 mg/dL — ABNORMAL HIGH (ref 0.0–1.2)
Total Protein: 5.1 g/dL — ABNORMAL LOW (ref 6.5–8.1)

## 2024-05-29 LAB — CBC WITH DIFFERENTIAL/PLATELET
Abs Immature Granulocytes: 0.12 10*3/uL — ABNORMAL HIGH (ref 0.00–0.07)
Basophils Absolute: 0 10*3/uL (ref 0.0–0.1)
Basophils Relative: 0 %
Eosinophils Absolute: 0.1 10*3/uL (ref 0.0–0.5)
Eosinophils Relative: 1 %
HCT: 34.6 % — ABNORMAL LOW (ref 36.0–46.0)
Hemoglobin: 11.6 g/dL — ABNORMAL LOW (ref 12.0–15.0)
Immature Granulocytes: 1 %
Lymphocytes Relative: 19 %
Lymphs Abs: 2.6 10*3/uL (ref 0.7–4.0)
MCH: 29.7 pg (ref 26.0–34.0)
MCHC: 33.5 g/dL (ref 30.0–36.0)
MCV: 88.5 fL (ref 80.0–100.0)
Monocytes Absolute: 0.9 10*3/uL (ref 0.1–1.0)
Monocytes Relative: 7 %
Neutro Abs: 9.7 10*3/uL — ABNORMAL HIGH (ref 1.7–7.7)
Neutrophils Relative %: 72 %
Platelets: 174 10*3/uL (ref 150–400)
RBC: 3.91 MIL/uL (ref 3.87–5.11)
RDW: 13 % (ref 11.5–15.5)
WBC: 13.5 10*3/uL — ABNORMAL HIGH (ref 4.0–10.5)
nRBC: 0 % (ref 0.0–0.2)

## 2024-05-29 LAB — TYPE AND SCREEN
ABO/RH(D): A POS
Antibody Screen: NEGATIVE

## 2024-05-29 MED ORDER — GABAPENTIN 300 MG PO CAPS
300.0000 mg | ORAL_CAPSULE | Freq: Three times a day (TID) | ORAL | Status: DC
  Administered 2024-05-29 – 2024-05-30 (×2): 300 mg via ORAL
  Filled 2024-05-29 (×2): qty 1

## 2024-05-29 MED ORDER — KETOROLAC TROMETHAMINE 30 MG/ML IJ SOLN
15.0000 mg | Freq: Once | INTRAMUSCULAR | Status: AC | PRN
  Administered 2024-05-29: 15 mg via INTRAVENOUS
  Filled 2024-05-29: qty 1

## 2024-05-29 NOTE — Progress Notes (Signed)
 Post Partum Day 1 Subjective: no complaints, up ad lib, and voiding  Objective: Blood pressure (!) 110/52, pulse 70, temperature 98.2 F (36.8 C), temperature source Oral, resp. rate 18, height 5\' 3"  (1.6 m), weight 71 kg, last menstrual period 07/09/2023, SpO2 100%, unknown if currently breastfeeding.  Physical Exam:  General: alert, cooperative, and appears stated age 28: appropriate Uterine Fundus: firm Incision: healing well DVT Evaluation: No evidence of DVT seen on physical exam.  Recent Labs    05/26/24 0815 05/28/24 0653  HGB 10.2* 11.0*  HCT 30.6* 32.9*    Assessment/Plan: Plan for discharge tomorrow, Breastfeeding, and Contraception s/p BTL   LOS: 6 days   Granville Layer, MD 05/29/2024, 7:57 AM

## 2024-05-29 NOTE — Lactation Note (Signed)
 This note was copied from a baby's chart. Lactation Consultation Note  Patient Name: Angela Branch Date: 05/29/2024 Age:27 hours Reason for consult: Follow-up assessment;NICU baby;Late-preterm 34-36.6wks;Infant < 6lbs RN told LC that the smaller bottles were over flowing d/t mom had so much milk. LC gave mom the larger storage bottles.  Mom stated everything is going well w/pumping and her milk is getting more volume. Mom appreciative of the bottles. Mom has no concerns at this time.  Mom stated she is feeling better tonight than last night when LC came by to see her. Encouraged to call for questions or concerns. Praised mom for pumping so well.  Maternal Data Does the patient have breastfeeding experience prior to this delivery?: Yes How long did the patient breastfeed?: BF her last 3 children, her 56 yr old for 6 months, her 27 yr old for 2 yrs, her 27 yr old for 1 1/2 yrs.  Feeding    LATCH Score                    Lactation Tools Discussed/Used Tools: Pump;Flanges Flange Size: 18 Breast pump type: Double-Electric Breast Pump Reason for Pumping: LPI/NICU  Interventions    Discharge    Consult Status Consult Status: NICU follow-up Date: 05/30/24 Follow-up type: In-patient    Angela Branch 05/29/2024, 9:51 PM

## 2024-05-29 NOTE — Lactation Note (Signed)
 This note was copied from a baby's chart. Lactation Consultation Note  Patient Name: Angela Branch [id number] Date: 05/29/2024 Age:27 hours Reason for consult: Initial assessment;Late-preterm 34-36.6wks;NICU baby;Infant < 6lbs Mom all ready had DEBP set up and had pumped. LC changed flanges to #18 to fit mom better.  Mom was in a lot of pain and had been crying. Mom not feeling like talking a lot. Mom did tell LC that she doesn't have a DEBP. She does have WIC. [facility] sent referral for mom for pump. Encouraged mom to pump every 3 hrs if she can. LC brought extra pan for cleaning and more bottles for milk.   Maternal Data    Feeding    LATCH Score       Type of Nipple: Everted at rest and after stimulation  Comfort (Breast/Nipple): Soft / non-tender         Lactation Tools Discussed/Used Tools: Pump;Flanges Flange Size: 18 Breast pump type: Double-Electric Breast Pump Pump Education: Milk Storage (RN explained set up/mom has no questions) Reason for Pumping: NICU/LPI  Interventions Interventions: DEBP;LC Services brochure;CDC milk storage guidelines  Discharge Pump: DEBP WIC Program: Yes  Consult Status Consult Status: NICU follow-up Date: 05/30/24 Follow-up type: In-patient    Greysin Medlen G 05/29/2024, 5:28 AM

## 2024-05-29 NOTE — Anesthesia Postprocedure Evaluation (Signed)
 Anesthesia Post Note  Patient: Angela Branch  Procedure(s) Performed: AN AD HOC LABOR EPIDURAL     Patient location during evaluation: Women's Unit Anesthesia Type: Epidural Level of consciousness: awake Pain management: pain level controlled Vital Signs Assessment: post-procedure vital signs reviewed and stable Respiratory status: spontaneous breathing, nonlabored ventilation and respiratory function stable Cardiovascular status: stable Postop Assessment: no headache, epidural receding, patient able to bend at knees, no apparent nausea or vomiting and able to ambulate Anesthetic complications: no Comments: Patient complaining of back pain at the epidural site. She denies any pain, weakness, and/or numbness in her legs. She is tender to palpation around the epidural insertion site but not elsewhere in her back. The epidural site is otherwise clean without any erythema, induration, or discharge. She has been able to walk to the bathroom and has been able to urinate appropriately. She has not yet had a bowel movement. She has been afebrile. She rates her current pain as a 6/10 and has been taking Tylenol , Advil , and oxycodone  as well as using a heating pad. I discussed that back pain at the epidural site is common and that in the absence of other symptoms, it should improve. I also instructed the patient that if she were to have any progression of her symptoms to notify us  immediately as anesthesia is available 24/7. She expressed understanding.   No notable events documented.  Last Vitals:  Vitals:   05/29/24 0435 05/29/24 0825  BP:  110/69  Pulse:  68  Resp:  20  Temp:  36.7 C  SpO2: 100% 99%    Last Pain:  Vitals:   05/29/24 0825  TempSrc: Oral  PainSc:                  [name]

## 2024-05-30 MED ORDER — ZOLPIDEM TARTRATE 5 MG PO TABS
5.0000 mg | ORAL_TABLET | Freq: Every evening | ORAL | Status: DC | PRN
  Administered 2024-05-30: 5 mg via ORAL
  Filled 2024-05-30: qty 1

## 2024-05-30 MED ORDER — IBUPROFEN 600 MG PO TABS: 600.0000 mg | ORAL_TABLET | Freq: Four times a day (QID) | ORAL | 0 refills | Status: DC

## 2024-05-30 MED ORDER — HYDROMORPHONE HCL 1 MG/ML IJ SOLN
2.0000 mg | Freq: Once | INTRAMUSCULAR | Status: AC
  Administered 2024-05-30: 2 mg via INTRAMUSCULAR
  Filled 2024-05-30: qty 2

## 2024-05-30 MED ORDER — HYDROMORPHONE HCL 2 MG PO TABS: 1.0000 mg | ORAL_TABLET | ORAL | 0 refills | Status: DC | PRN

## 2024-05-31 ENCOUNTER — Other Ambulatory Visit: Payer: Self-pay | Admitting: Obstetrics and Gynecology

## 2024-05-31 MED ORDER — HYDROMORPHONE HCL 2 MG PO TABS: 1.0000 mg | ORAL_TABLET | Freq: Four times a day (QID) | ORAL | 0 refills | Status: DC | PRN

## 2024-05-31 NOTE — Progress Notes (Signed)
 Patient called hospital stating Dilaudid  prescription from recent hospitalization was not filled by pharmacy. Called pharmacy and they confirmed this was not received by them or filled. Reviewed PDMP and discharge summary. New script sent.

## 2024-05-31 NOTE — Anesthesia Postprocedure Evaluation (Signed)
 Anesthesia Post Note  Patient: Angela Branch  Procedure(s) Performed: LIGATION, FALLOPIAN TUBE, POSTPARTUM (Bilateral)     Patient location during evaluation: PACU Anesthesia Type: Epidural Level of consciousness: oriented and awake and alert Pain management: pain level controlled Vital Signs Assessment: post-procedure vital signs reviewed and stable Respiratory status: spontaneous breathing, respiratory function stable and patient connected to nasal cannula oxygen Cardiovascular status: blood pressure returned to baseline and stable Postop Assessment: no headache, no backache, no apparent nausea or vomiting and epidural receding Anesthetic complications: no  No notable events documented.  Last Vitals:  Vitals:   05/30/24 0441 05/30/24 0817  BP: (!) 96/50 104/62  Pulse: 78 69  Resp: 16 18  Temp: 36.7 C 36.5 C  SpO2: 99% 100%    Last Pain:  Vitals:   05/30/24 0959  TempSrc:   PainSc: 7    Pain Goal: Patients Stated Pain Goal: 3 (05/26/24 0824)                 Myrla Asp L Chimamanda Siegfried

## 2024-06-01 LAB — SURGICAL PATHOLOGY

## 2024-06-02 ENCOUNTER — Ambulatory Visit (HOSPITAL_COMMUNITY): Payer: Self-pay

## 2024-06-02 NOTE — Lactation Note (Signed)
 This note was copied from a patient's chart.  NICU Lactation Consultation Note  Patient Name: Angela Branch [id number] Date: 06/02/2024 Age:28 years  Reason for consult: Follow-up assessment; NICU baby; Late-preterm 34-36.6wks; Infant < 6lbs  SUBJECTIVE  LC in to visit with P4 Mom of baby [name] in the NICU.  Baby is [redacted]w[redacted]d and on CPAP and gavage feedings.  Mom holding baby when [facility] arrived.  Mom brought several 4 oz bottles of breast milk from home.  Mom did pick up her WIC pump on 05/31/24 after being discharged on 6/8.    Mom denies any engorgement, other than some tender areas on her left breast.  Encouraged frequent pumping, using ice packs if breasts are hard and painful, pumping after.  Mom to hold off on deep massage over the tender areas, but rather gentle massage during pumping.   LC set up DEBP and washing and drying basins for Mom to pump while at the hospital with baby.  Mom instructed on the sanitization of her pump parts after she has cleaned her pump parts and she can let baby's RN know they are ready.  Explained that this is recommended once per day.  OBJECTIVE Infant data: No data recorded O2 Device: CPAP FiO2 (%): 21 %  Infant feeding assessment IDFTS - Readiness: 5 (cpap)   Maternal data: [id number] Vaginal, Spontaneous Pumping frequency: inconsistant, maybe 4 times last 24 hrs.  Mom encouraged to pump more often 6-8 times per 24 hrs Pumped volume: 180 mL (180-210 ml) Flange Size: 18 Hands-free pumping top sizes: Small/Medium (Blue)  WIC Program: Yes WIC Referral Sent?: Yes What county?: [place] Pump: WIC Pump  ASSESSMENT Infant:  Feeding Status: Scheduled 9-12-3-6 Feeding method: Tube/Gavage (Bolus)  Maternal: Milk volume: Abundant  INTERVENTIONS/PLAN Interventions: Interventions: Breast feeding basics reviewed; Skin to skin; Breast massage; Hand express; DEBP; Education Discharge Education: Engorgement and breast care Tools: Pump; Flanges;  Hands-free pumping top Pump Education: Setup, frequency, and cleaning; Milk Storage  Plan: Consult Status: NICU follow-up NICU Follow-up type: Weekly NICU follow up   [name] 06/02/2024, 4:46 PM

## 2024-06-04 ENCOUNTER — Encounter: Payer: Self-pay | Admitting: *Deleted

## 2024-06-09 ENCOUNTER — Telehealth (HOSPITAL_COMMUNITY): Payer: Self-pay | Admitting: *Deleted

## 2024-06-09 ENCOUNTER — Encounter: Payer: Self-pay | Admitting: Family Medicine

## 2024-06-09 NOTE — Telephone Encounter (Signed)
 06/09/2024  Name: Angela Branch MRN: [id number] DOB: Dec 21, 1996  Reason for Call:  Transition of Care Hospital Discharge Call  Contact Status: Patient Contact Status: Complete  Language assistant needed:          Follow-Up Questions: Do You Have Any Concerns About Your Health As You Heal From Delivery?: No Do You Have Any Concerns About Your Infants Health?: Infant in NICU  Edinburgh Postnatal Depression Scale:  In the Past 7 Days: I have been able to laugh and see the funny side of things.: As much as I always could I have looked forward with enjoyment to things.: As much as I ever did I have blamed myself unnecessarily when things went wrong.: No, never I have been anxious or worried for no good reason.: No, not at all I have felt scared or panicky for no good reason.: No, not at all Things have been getting on top of me.: No, I have been coping as well as ever I have been so unhappy that I have had difficulty sleeping.: Not at all I have felt sad or miserable.: No, not at all I have been so unhappy that I have been crying.: No, never The thought of harming myself has occurred to me.: Never Dimple Francis Postnatal Depression Scale Total: 0  PHQ2-9 Depression Scale:     Discharge Follow-up: Edinburgh score requires follow up?: No Patient was advised of the following resources:: Breastfeeding Support Group, Support Group  Post-discharge interventions: Reviewed Newborn Safe Sleep Practices  Signature Julien Odor, RN, 06/09/24, [phone]

## 2024-06-23 ENCOUNTER — Encounter: Admitting: Family Medicine

## 2024-06-30 ENCOUNTER — Encounter: Admitting: Obstetrics and Gynecology

## 2024-07-07 ENCOUNTER — Ambulatory Visit: Admitting: Family Medicine

## 2024-07-07 ENCOUNTER — Other Ambulatory Visit: Payer: Self-pay

## 2024-07-07 ENCOUNTER — Other Ambulatory Visit (HOSPITAL_COMMUNITY)
Admission: RE | Admit: 2024-07-07 | Discharge: 2024-07-07 | Disposition: A | Discharge status: Home / Self Care | Source: Ambulatory Visit | Attending: Family Medicine | Admitting: Family Medicine

## 2024-07-07 ENCOUNTER — Encounter: Admitting: Family Medicine

## 2024-07-07 ENCOUNTER — Ambulatory Visit: Admitting: Certified Nurse Midwife

## 2024-07-07 ENCOUNTER — Encounter: Payer: Self-pay | Admitting: Family Medicine

## 2024-07-07 VITALS — BP 103/74 | HR 91 | Ht 63.0 in | Wt 150.2 lb

## 2024-07-07 DIAGNOSIS — F4310 Post-traumatic stress disorder, unspecified: Secondary | ICD-10-CM | POA: Diagnosis not present

## 2024-07-07 DIAGNOSIS — N898 Other specified noninflammatory disorders of vagina: Secondary | ICD-10-CM | POA: Diagnosis not present

## 2024-07-07 DIAGNOSIS — Z124 Encounter for screening for malignant neoplasm of cervix: Secondary | ICD-10-CM | POA: Diagnosis present

## 2024-07-07 DIAGNOSIS — F419 Anxiety disorder, unspecified: Secondary | ICD-10-CM | POA: Diagnosis not present

## 2024-07-07 MED ORDER — POLYETHYLENE GLYCOL 3350 17 GM/SCOOP PO POWD: 17.0000 g | Freq: Every day | ORAL | 1 refills | Status: DC | PRN

## 2024-07-07 NOTE — Progress Notes (Unsigned)
 Post Partum Visit Note  Angela Branch is a 28 y.o. [phone] female who presents for a postpartum visit. She is 5 weeks postpartum following a normal spontaneous vaginal delivery.  I have fully reviewed the prenatal and intrapartum course. The delivery was at 34/0 gestational weeks.  Anesthesia: epidural. Postpartum course has been uncomplicated. Baby is doing well. Baby is feeding by breast. Bleeding staining only. Bowel function is constipation. Bladder function is normal. Patient is sexually active. Contraception method is tubal ligation. Postpartum depression screening: negative.   The pregnancy intention screening data noted above was reviewed. Potential methods of contraception were discussed. The patient elected to proceed with No data recorded.   Edinburgh Postnatal Depression Scale - 07/07/24 1526       Edinburgh Postnatal Depression Scale:  In the Past 7 Days   I have been able to laugh and see the funny side of things. 0    I have looked forward with enjoyment to things. 0    I have blamed myself unnecessarily when things went wrong. 0    I have been anxious or worried for no good reason. 0    I have felt scared or panicky for no good reason. 0    Things have been getting on top of me. 0    I have been so unhappy that I have had difficulty sleeping. 0    I have felt sad or miserable. 0    I have been so unhappy that I have been crying. 0    The thought of harming myself has occurred to me. 0    Edinburgh Postnatal Depression Scale Total 0          Health Maintenance Due  Topic Date Due   Pneumococcal Vaccine 42-18 Years old (1 of 2 - PCV) Never done   COVID-19 Vaccine (1 - 2024-25 season) Never done   Cervical Cancer Screening (Pap smear)  07/25/2024    The following portions of the patient's history were reviewed and updated as appropriate: allergies, current medications, past family history, past medical history, past social history, past surgical history, and  problem list.  Review of Systems Pertinent items are noted in HPI.  Objective:  BP 103/74   Pulse 91   Ht 5' 3 (1.6 m)   Wt 150 lb 3.2 oz (68.1 kg)   LMP 07/09/2023 (Approximate) Comment: unsure of date  Breastfeeding Yes   BMI 26.61 kg/m    General:  alert, cooperative, and appears stated age   Breasts:  not indicated  Lungs: Normal work of breathing  Heart:  Regular rate  Abdomen: Soft, nontender   Wound well approximated incision  GU exam:  normal       Assessment:    There are no diagnoses linked to this encounter.  Normal postpartum exam.   Plan:   Essential components of care per ACOG recommendations:  1.  Mood and well being: Patient with negative depression screening today.  Patient does report issues with anxiety and she is scheduled to see her psychiatrist but would like therapy.  Referral placed for integrated behavioral health.  Reviewed local resources for support.  - Patient tobacco use? No.   - hx of drug use? No.    2. Infant care and feeding:  -Patient currently breastmilk feeding? Yes. Discussed returning to work and pumping. Reviewed importance of draining breast regularly to support lactation.  -Social determinants of health (SDOH) reviewed in EPIC. No concerns  3. Sexuality, contraception  and birth spacing - Patient does not want a pregnancy in the next year.  - Reviewed reproductive life planning. Reviewed contraceptive methods based on pt preferences and effectiveness.  Patient had postpartum tubal ligation  4. Sleep and fatigue -Encouraged family/partner/community support of 4 hrs of uninterrupted sleep to help with mood and fatigue  5. Physical Recovery  - Discussed patients delivery and complications. She describes her labor as good. - Patient had a Vaginal, no problems at delivery. Patient had a no laceration. Perineal healing reviewed. Patient expressed understanding - Patient has urinary incontinence? No. - Patient is safe to resume  physical and sexual activity  6.  Health Maintenance - HM due items addressed Yes - Last pap smear  Diagnosis  Date Value Ref Range Status  07/25/2021   Final   - Negative for intraepithelial lesion or malignancy (NILM)   Pap smear done at today's visit.  -Breast Cancer screening indicated? No.   7. Chronic Disease/Pregnancy Condition follow up: None  - PCP follow up  Norleen LULLA Rover, MD [facility], [facility]

## 2024-07-08 ENCOUNTER — Ambulatory Visit: Payer: Self-pay | Admitting: Family Medicine

## 2024-07-08 LAB — CERVICOVAGINAL ANCILLARY ONLY
Bacterial Vaginitis (gardnerella): POSITIVE — AB
Candida Glabrata: NEGATIVE
Candida Vaginitis: NEGATIVE
Comment: NEGATIVE
Comment: NEGATIVE
Comment: NEGATIVE

## 2024-07-08 MED ORDER — METRONIDAZOLE 500 MG PO TABS: 500.0000 mg | ORAL_TABLET | Freq: Two times a day (BID) | ORAL | 0 refills | Status: DC

## 2024-07-09 LAB — CYTOLOGY - PAP
Adequacy: ABSENT
Diagnosis: NEGATIVE

## 2024-07-22 ENCOUNTER — Encounter (HOSPITAL_COMMUNITY): Admitting: Physician Assistant

## 2024-07-22 ENCOUNTER — Ambulatory Visit (INDEPENDENT_AMBULATORY_CARE_PROVIDER_SITE_OTHER): Admitting: Physician Assistant

## 2024-07-22 ENCOUNTER — Encounter (HOSPITAL_COMMUNITY): Payer: Self-pay | Admitting: Physician Assistant

## 2024-07-22 VITALS — BP 101/67 | HR 71 | Temp 98.2°F | Ht 63.0 in | Wt 155.6 lb

## 2024-07-22 DIAGNOSIS — F411 Generalized anxiety disorder: Secondary | ICD-10-CM | POA: Diagnosis not present

## 2024-07-22 DIAGNOSIS — F4310 Post-traumatic stress disorder, unspecified: Secondary | ICD-10-CM

## 2024-07-22 DIAGNOSIS — F330 Major depressive disorder, recurrent, mild: Secondary | ICD-10-CM

## 2024-07-22 MED ORDER — BUSPIRONE HCL 7.5 MG PO TABS: 7.5000 mg | ORAL_TABLET | Freq: Two times a day (BID) | ORAL | 1 refills | Status: DC
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
## 2024-08-09 ENCOUNTER — Other Ambulatory Visit: Payer: Self-pay | Admitting: Surgery

## 2024-08-09 DIAGNOSIS — K7689 Other specified diseases of liver: Secondary | ICD-10-CM

## 2024-08-09 DIAGNOSIS — D1803 Hemangioma of intra-abdominal structures: Secondary | ICD-10-CM

## 2024-08-30 DIAGNOSIS — J45901 Unspecified asthma with (acute) exacerbation: Secondary | ICD-10-CM | POA: Diagnosis not present

## 2024-08-30 DIAGNOSIS — R051 Acute cough: Secondary | ICD-10-CM | POA: Diagnosis not present

## 2024-08-30 DIAGNOSIS — R519 Headache, unspecified: Secondary | ICD-10-CM | POA: Diagnosis not present

## 2024-08-30 DIAGNOSIS — J029 Acute pharyngitis, unspecified: Secondary | ICD-10-CM | POA: Diagnosis not present

## 2024-09-07 ENCOUNTER — Encounter (HOSPITAL_COMMUNITY): Admitting: Physician Assistant

## 2024-09-07 ENCOUNTER — Encounter (HOSPITAL_COMMUNITY): Payer: Self-pay

## 2024-11-01 ENCOUNTER — Encounter: Payer: Self-pay | Admitting: Surgery

## 2024-11-01 DIAGNOSIS — F411 Generalized anxiety disorder: Secondary | ICD-10-CM | POA: Diagnosis not present

## 2024-11-02 ENCOUNTER — Telehealth (HOSPITAL_COMMUNITY): Payer: Self-pay | Admitting: Physician Assistant

## 2024-11-02 NOTE — Telephone Encounter (Signed)
 Patient left a voice mail on 11/11 at 12:45 pm to state that she had taken the drug test that would allow her to get her medications. Her callback number is [phone]. Please advise. Thank you.

## 2024-11-03 LAB — URINE DRUG PANEL 7
Amphetamines, Urine: NEGATIVE ng/mL
Barbiturate Quant, Ur: NEGATIVE ng/mL
Benzodiazepine Quant, Ur: NEGATIVE ng/mL
Cannabinoid Quant, Ur: NEGATIVE ng/mL
Cocaine (Metab.): NEGATIVE ng/mL
Creatinine, Urine: 122.8 mg/dL (ref 20.0–300.0)
Nitrite Urine, Quantitative: NEGATIVE ug/mL
OPIATE SCREEN URINE: NEGATIVE ng/mL
PCP Quant, Ur: NEGATIVE ng/mL
pH, Urine: 5.7 (ref 4.5–8.9)

## 2024-11-04 NOTE — Progress Notes (Signed)
 Cardiology Office Note   Date:  11/08/2024  ID:  LEOTHA WESTERMEYER, DOB [birth year], MRN [id number] PCP: Pcp, No  [facility] Providers Cardiologist:  Wilbert Bihari, MD     History of Present Illness Angela Branch is a 28 y.o. 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  12/04/2022 echo EF 55 to 60%, trivial MR 12/06/2021 monitor predominantly sinus rhythm, average heart rate 89 bpm, rare PVCs 12/04/2020 cardiac MRI no LGE, normal cardiac MRI  She established care with Dr. Bihari in 2020 for being a carrier of Duchenne muscular dystrophy, and also had a syncopal episode during her pregnancy.  An echo was normal, cardiac MRI was overall normal, she wore a monitor that revealed rare PVCs.  Most recently evaluated by Rosaline Bane NP on 11/06/2023, she was overall stable at that time, but dealing with asthmatic type symptoms and was referred to pulmonology and advised to follow-up in a year.  She presents today for follow-up, she has been doing okay since she was last evaluated in our office.  She is a mother to 5 small children ranging from 5 months to 57 years old, she stays very busy.  Had a few episodes of chest pain--not consistent with angina, hard for her to decipher if this is different than what she has experienced in the past or not.  She plans to follow back up with pulmonology regarding her asthma. She denies  palpitations, dyspnea, pnd, orthopnea, n, v, dizziness, syncope, edema, weight gain, or early satiety.    ROS: Review of Systems  Cardiovascular:  Positive for chest pain.  All other systems reviewed and are negative.    Studies Reviewed EKG Interpretation Date/Time:  Monday November 08 2024 11:48:21 EST Ventricular Rate:  89 PR Interval:  124 QRS Duration:  74 QT Interval:  366 QTC Calculation: 445 R Axis:   57  Text Interpretation: Normal sinus rhythm Normal ECG When compared  with ECG of 06-Nov-2023 15:24, No significant change was found Confirmed by Carlin Nest [phone]) on 11/08/2024 11:49:49 AM     Risk Assessment/Calculations          Cardiac Studies & Procedures   ______________________________________________________________________________________________     ECHOCARDIOGRAM  ECHOCARDIOGRAM COMPLETE 12/03/2022  Narrative ECHOCARDIOGRAM REPORT    Patient Name:   Angela Branch Date of Exam: 12/03/2022 Medical Rec #:  [id number]        Height:       63.0 in Accession #:    [id number]       Weight:       160.4 lb Date of Birth:  16-Nov-1996       BSA:          1.761 m Patient Age:    26 years         BP:           112/68 mmHg Patient Gender: F                HR:           64 bpm. Exam Location:  [address]  Procedure: 2D Echo, Cardiac Doppler and Color Doppler  Indications:    Z14.8 Carrier of Duchenne muscular dystrophy  History:        Patient has prior history of Echocardiogram examinations, most recent 10/20/2019. Carrier of Duchenne muscular dystrophy; Signs/Symptoms:Palpitations, Shortness of Breath and Syncope.  Sonographer:    Elsie Bohr RDCS Referring Phys: [phone]  TRACI R TURNER  IMPRESSIONS   1. Left ventricular ejection fraction, by estimation, is 55 to 60%. The left ventricle has normal function. The left ventricle has no regional wall motion abnormalities. Left ventricular diastolic parameters were normal. The average left ventricular global longitudinal strain is -18.3 %. The global longitudinal strain is normal. 2. Right ventricular systolic function is normal. The right ventricular size is normal. There is normal pulmonary artery systolic pressure. 3. The mitral valve is normal in structure. Trivial mitral valve regurgitation. No evidence of mitral stenosis. 4. The aortic valve is tricuspid. Aortic valve regurgitation is not visualized. No aortic stenosis is present. 5. The inferior vena cava is normal in size  with greater than 50% respiratory variability, suggesting right atrial pressure of 3 mmHg.  FINDINGS Left Ventricle: Left ventricular ejection fraction, by estimation, is 55 to 60%. The left ventricle has normal function. The left ventricle has no regional wall motion abnormalities. The average left ventricular global longitudinal strain is -18.3 %. The global longitudinal strain is normal. The left ventricular internal cavity size was normal in size. There is no left ventricular hypertrophy. Left ventricular diastolic parameters were normal. Normal left ventricular filling pressure.  Right Ventricle: The right ventricular size is normal. No increase in right ventricular wall thickness. Right ventricular systolic function is normal. There is normal pulmonary artery systolic pressure. The tricuspid regurgitant velocity is 1.74 m/s, and with an assumed right atrial pressure of 3 mmHg, the estimated right ventricular systolic pressure is 15.1 mmHg.  Left Atrium: Left atrial size was normal in size.  Right Atrium: Right atrial size was normal in size.  Pericardium: There is no evidence of pericardial effusion.  Mitral Valve: The mitral valve is normal in structure. Trivial mitral valve regurgitation. No evidence of mitral valve stenosis.  Tricuspid Valve: The tricuspid valve is normal in structure. Tricuspid valve regurgitation is mild . No evidence of tricuspid stenosis.  Aortic Valve: The aortic valve is tricuspid. Aortic valve regurgitation is not visualized. No aortic stenosis is present.  Pulmonic Valve: The pulmonic valve was normal in structure. Pulmonic valve regurgitation is not visualized. No evidence of pulmonic stenosis.  Aorta: The aortic root is normal in size and structure.  Venous: The inferior vena cava is normal in size with greater than 50% respiratory variability, suggesting right atrial pressure of 3 mmHg.  IAS/Shunts: No atrial level shunt detected by color flow  Doppler.   LEFT VENTRICLE PLAX 2D LVIDd:         4.60 cm   Diastology LVIDs:         3.20 cm   LV e' medial:    15.70 cm/s LV PW:         0.90 cm   LV E/e' medial:  5.7 LV IVS:        0.90 cm   LV e' lateral:   21.50 cm/s LVOT diam:     2.00 cm   LV E/e' lateral: 4.2 LV SV:         48 LV SV Index:   27        2D Longitudinal Strain LVOT Area:     3.14 cm  2D Strain GLS (A2C):   -22.5 % 2D Strain GLS (A3C):   -16.3 % 2D Strain GLS (A4C):   -16.4 % 2D Strain GLS Avg:     -18.3 %  3D Volume EF: 3D EF:        55 % LV EDV:  121 ml LV ESV:       55 ml LV SV:        66 ml  RIGHT VENTRICLE             IVC RV S prime:     16.20 cm/s  IVC diam: 1.10 cm TAPSE (M-mode): 1.8 cm RVSP:           15.1 mmHg  LEFT ATRIUM             Index        RIGHT ATRIUM           Index LA diam:        3.00 cm 1.70 cm/m   RA Pressure: 3.00 mmHg LA Vol (A2C):   34.7 ml 19.71 ml/m  RA Area:     13.70 cm LA Vol (A4C):   33.8 ml 19.20 ml/m  RA Volume:   34.90 ml  19.82 ml/m LA Biplane Vol: 34.6 ml 19.65 ml/m AORTIC VALVE LVOT Vmax:   77.40 cm/s LVOT Vmean:  52.300 cm/s LVOT VTI:    0.152 m  AORTA Ao Root diam: 2.60 cm Ao Asc diam:  2.40 cm  MITRAL VALVE               TRICUSPID VALVE MV Area (PHT): 3.95 cm    TR Peak grad:   12.1 mmHg MV Decel Time: 192 msec    TR Vmax:        174.00 cm/s MV E velocity: 89.60 cm/s  Estimated RAP:  3.00 mmHg MV A velocity: 45.60 cm/s  RVSP:           15.1 mmHg MV E/A ratio:  1.96 SHUNTS Systemic VTI:  0.15 m Systemic Diam: 2.00 cm  Annabella Scarce MD Electronically signed by Annabella Scarce MD Signature Date/Time: 12/04/2022/5:36:10 AM    Final    MONITORS  CARDIAC EVENT MONITOR 12/06/2021  Narrative  Predominant rhythm was normal sinus rhythm with average heart rate 89bpm and ranged from 67 to 136bpm.  Rare PVCs.  No correlation of patients symptoms with arrhythmias.     CARDIAC MRI  MR CARDIAC MORPHOLOGY W WO CONTRAST  12/04/2020  Narrative CLINICAL DATA:  27 year old female who is a carrier for Duchenne's muscular dystrophy and an episode of syncope.  EXAM: CARDIAC MRI  TECHNIQUE: The patient was scanned on a 1.5 Tesla GE magnet. A dedicated cardiac coil was used. Functional imaging was done using Fiesta sequences. 2,3, and 4 chamber views were done to assess for RWMA's. Modified Simpson's rule using a short axis stack was used to calculate an ejection fraction on a dedicated work [employer]. The patient received 7 cc of Gadavist . After 10 minutes inversion recovery sequences were used to assess for infiltration and scar tissue.  CONTRAST:  7 cc  of Gadavist   FINDINGS: 1. Mildly dilated left ventricle with normal thickness and systolic function (LVEF = 57%). There are no regional wall motion abnormalities.  There is no late gadolinium enhancement in the left ventricular myocardium.  LVEDD: 56 mm  LVESD: 40 mm  LVEDV: 158 ml  LVESV: 67 ml  SV: 91 ml  CO: 4.8 L/min  Myocardial mass: 74 g  2. Normal right ventricular size, thickness and systolic function (RVEF = 56%). There are no regional wall motion abnormalities.  3.  Normal left and right atrial size.  4. Normal size of the aortic root, ascending aorta and pulmonary artery (28 mm).  5.  Mild mitral regurgitation  and trivial tricuspid regurgitation.  6.  Normal pericardium. Trivial pericardial effusion.  IMPRESSION: 1. Mildly dilated left ventricle with normal thickness and systolic function (LVEF = 57%). There are no regional wall motion abnormalities and no late gadolinium enhancement in the left ventricular myocardium.  2. Normal right ventricular size, thickness and systolic function (RVEF = 56%). There are no regional wall motion abnormalities.  3. Normal left and right atrial size.  4. Normal size of the aortic root, ascending aorta and pulmonary artery (28 mm).  5. Mild mitral  regurgitation and trivial tricuspid regurgitation.  6. Normal pericardium. Trivial pericardial effusion.  Normal biventricular function.  Normal cardiac MRI.   Electronically Signed By: Angela Branch On: 12/06/2020 13:27   ______________________________________________________________________________________________      Physical Exam VS:  BP 110/72 (BP Location: Left Arm, Patient Position: Sitting, Cuff Size: Normal)   Pulse 89   Resp 18   Ht 5' 3 (1.6 m)   Wt 160 lb 3.2 oz (72.7 kg)   SpO2 95%   BMI 28.38 kg/m        Wt Readings from Last 3 Encounters:  11/08/24 160 lb 3.2 oz (72.7 kg)  07/07/24 150 lb 3.2 oz (68.1 kg)  05/23/24 156 lb 8 oz (71 kg)    GEN: Well nourished, well developed in no acute distress NECK: No JVD; No carotid bruits CARDIAC: RRR, no murmurs, rubs, gallops RESPIRATORY:  Clear to auscultation without rales, wheezing or rhonchi  ABDOMEN: Soft, non-tender, non-distended EXTREMITIES:  No edema; No deformity   ASSESSMENT AND PLAN Carrier of Duchenne's muscular dystrophy-current guidelines recommend screening with cardiac MRI every 3 to 5 years, she is having some episodes of chest pain, not consistent with angina however it has been 4 years since her last MRI we will repeat this.  Will repeat CBC and BMET.  Asthma-following with [facility] pulmonology.  Dispo: CBC, BMET, cMRI, follow up in 1 year.   Signed, Delon JAYSON Hoover, NP

## 2024-11-05 ENCOUNTER — Other Ambulatory Visit (HOSPITAL_COMMUNITY): Payer: Self-pay | Admitting: Physician Assistant

## 2024-11-05 ENCOUNTER — Other Ambulatory Visit

## 2024-11-05 DIAGNOSIS — F411 Generalized anxiety disorder: Secondary | ICD-10-CM

## 2024-11-05 MED ORDER — CLONAZEPAM 0.5 MG PO TABS: 0.2500 mg | ORAL_TABLET | Freq: Two times a day (BID) | ORAL | 0 refills | Status: AC

## 2024-11-05 MED ORDER — BUSPIRONE HCL 7.5 MG PO TABS
7.5000 mg | ORAL_TABLET | Freq: Two times a day (BID) | ORAL | 1 refills | Status: DC
Start: 2024-11-05 — End: 2024-11-09

## 2024-11-05 NOTE — Progress Notes (Signed)
 Patient was able to have a urine drug screen performed.  Patient urine drug screen was negative for any significant illicit substances.  Provider to place patient on clonazepam  0.25 mg 2 times daily as needed for the management of her anxiety.  Patient PDMP was reviewed prior to refilling clonazepam .  Patient's medication to be e-prescribed to pharmacy of choice.

## 2024-11-08 ENCOUNTER — Encounter: Payer: Self-pay | Admitting: Cardiology

## 2024-11-08 ENCOUNTER — Ambulatory Visit: Attending: Cardiology | Admitting: Cardiology

## 2024-11-08 VITALS — BP 110/72 | HR 89 | Resp 18 | Ht 63.0 in | Wt 160.2 lb

## 2024-11-08 DIAGNOSIS — Z148 Genetic carrier of other disease: Secondary | ICD-10-CM

## 2024-11-08 DIAGNOSIS — R002 Palpitations: Secondary | ICD-10-CM | POA: Diagnosis not present

## 2024-11-08 NOTE — Patient Instructions (Addendum)
 Medication Instructions:   *If you need a refill on your cardiac medications before your next appointment, please call your pharmacy*  Lab Work: CBC, BMET, today  Testing/Procedures: Your physician has requested that you have a cardiac MRI. Cardiac MRI uses a computer to create images of your heart as its beating, producing both still and moving pictures of your heart and major blood vessels. For further information please visit [email or web address]. Please follow the instruction sheet given to you today for more information.   Follow-Up: At [facility], you and your health needs are our priority.  As part of our continuing mission to provide you with exceptional heart care, our providers are all part of one team.  This team includes your primary Cardiologist (physician) and Advanced Practice Providers or APPs (Physician Assistants and Nurse Practitioners) who all work together to provide you with the care you need, when you need it.  Your next appointment:   1 year(s)  Provider:   Wilbert Bihari, MD    We recommend signing up for the patient portal called MyChart.  Sign up information is provided on this After Visit Summary.  MyChart is used to connect with patients for Virtual Visits (Telemedicine).  Patients are able to view lab/test results, encounter notes, upcoming appointments, etc.  Non-urgent messages can be sent to your provider as well.   To learn more about what you can do with MyChart, go to [email or web address].      Nuclear Medicine Exam: What to Expect A nuclear medicine exam is a safe and painless imaging test. It can help your health care provider find diseases. It also shows how your organs work and how they're structured. For the exam, you'll be given a radioactive substance called a tracer. This will be absorbed by your body's organs. The tracer will show up when the scanner takes pictures of your body. There are a few kinds of this exam. They include: A CT  scan. An MRI. A PET scan. A SPECT scan. Tell your health care provider about: Any allergies you have. All medicines you take. These include vitamins, herbs, eye drops, and creams. Any problems you or family members have had with anesthesia. Any bleeding problems you have. Any surgeries you've had. Any medical conditions you have. Whether you're pregnant, may be pregnant, or are breastfeeding. What are the risks? Your provider will talk with you about risks. These may include: Exposure to a small amount of radiation. An allergic reaction to the tracer. This is rare. What happens before the exam? Medicines Ask about changing or stopping: Any medicines you take. Any vitamins, herbs, or supplements you take. Do not take aspirin or ibuprofen  unless you're told to. General instructions Eat and drink as told. Do not wear jewelry. Wear loose, comfortable clothes. You may be asked to wear a hospital gown for the exam. Bring past imaging studies with you if you have them. These include X-rays. What happens during the exam?  An IV will be put into a vein in your hand or arm. You'll be asked to lie on a table or sit in a chair. You'll be given the tracer. It may be given as: A pill or liquid to swallow. A shot. Medicine through your IV. A gas to breathe in. A large scanning machine will be used to make pictures of your body. After the pictures are taken, you may have to wait until your provider can make sure that enough pictures were taken. These steps may vary. Ask what  you can expect. What can I expect after the exam? Ask when your exam results will be ready and how to get them. You may need to call or meet with your provider to get your results. Also ask: What are my treatment options? What other tests do I need? What are my next steps? Follow these instructions at home: Drink more fluids as told. You may need to drink 6-8 glasses of water . This helps to remove the tracer from your  body. Ask what things are safe for you to do at home. Ask when you can go back to work or school. Get help right away if: You have trouble breathing. This symptom may be an emergency. Call 911 right away. Do not wait to see if the symptom will go away. Do not drive yourself to the hospital. This information is not intended to replace advice given to you by your health care provider. Make sure you discuss any questions you have with your health care provider. Document Revised: 06/25/2023 Document Reviewed: 06/25/2023 Elsevier Patient Education  2024 [employer].

## 2024-11-09 ENCOUNTER — Encounter (HOSPITAL_COMMUNITY): Payer: Self-pay | Admitting: Physician Assistant

## 2024-11-09 ENCOUNTER — Telehealth (INDEPENDENT_AMBULATORY_CARE_PROVIDER_SITE_OTHER): Admitting: Physician Assistant

## 2024-11-09 DIAGNOSIS — F330 Major depressive disorder, recurrent, mild: Secondary | ICD-10-CM

## 2024-11-09 DIAGNOSIS — F411 Generalized anxiety disorder: Secondary | ICD-10-CM | POA: Diagnosis not present

## 2024-11-09 LAB — BASIC METABOLIC PANEL WITH GFR
BUN/Creatinine Ratio: 16 (ref 9–23)
BUN: 18 mg/dL (ref 6–20)
CO2: 22 mmol/L (ref 20–29)
Calcium: 9 mg/dL (ref 8.7–10.2)
Chloride: 99 mmol/L (ref 96–106)
Creatinine, Ser: 1.1 mg/dL — ABNORMAL HIGH (ref 0.57–1.00)
Glucose: 77 mg/dL (ref 70–99)
Potassium: 4.2 mmol/L (ref 3.5–5.2)
Sodium: 139 mmol/L (ref 134–144)
eGFR: 71 mL/min/1.73

## 2024-11-09 LAB — CBC
Hematocrit: 43.6 % (ref 34.0–46.6)
Hemoglobin: 14.6 g/dL (ref 11.1–15.9)
MCH: 30.9 pg (ref 26.6–33.0)
MCHC: 33.5 g/dL (ref 31.5–35.7)
MCV: 92 fL (ref 79–97)
Platelets: 198 x10E3/uL (ref 150–450)
RBC: 4.73 x10E6/uL (ref 3.77–5.28)
RDW: 12.9 % (ref 11.7–15.4)
WBC: 7.5 x10E3/uL (ref 3.4–10.8)

## 2024-11-09 MED ORDER — BUSPIRONE HCL 10 MG PO TABS: 10.0000 mg | ORAL_TABLET | Freq: Two times a day (BID) | ORAL | 1 refills | Status: DC
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
## 2024-11-10 ENCOUNTER — Ambulatory Visit: Payer: Self-pay | Admitting: Cardiology

## 2024-11-10 ENCOUNTER — Telehealth: Payer: Self-pay

## 2024-11-10 DIAGNOSIS — Z148 Genetic carrier of other disease: Secondary | ICD-10-CM

## 2024-12-01 ENCOUNTER — Encounter (HOSPITAL_COMMUNITY): Payer: Self-pay

## 2024-12-02 ENCOUNTER — Encounter: Payer: Self-pay | Admitting: Cardiology

## 2024-12-02 ENCOUNTER — Telehealth: Payer: Self-pay

## 2024-12-02 MED ORDER — DIAZEPAM 5 MG PO TABS: 5.0000 mg | ORAL_TABLET | Freq: Once | ORAL | 0 refills | Status: AC

## 2024-12-02 MED ORDER — DIAZEPAM 5 MG PO TABS: 5.0000 mg | ORAL_TABLET | Freq: Once | ORAL | 0 refills | Status: DC

## 2024-12-02 NOTE — Telephone Encounter (Signed)
 Message from Chantal Requena, cardiac imaging RN stating patient has cardiac MRI tomorrow ordered by Delon Hoover NP. Patient is requesting medication for claustrophobia, but Delon Hoover is not available. DOD Dr. Wonda ordering one time dose 5 mg Valium. Med would not go through electronically so it was verbally called to pharmacy of choice. Spoke to Engineer, Water. TC to patient to advise of where to pick up med and that she needs a driver to transport her to/from appt. Patient verbalizes understanding. Patient does have a script for klonopin  on her med list but states she does not take this on a daily basis as she feels weird  on it and it does not work well for her. She states she plans to speak to her psychiatrist about discontinuing this medication. Advised patient that valium and klonopin  should not be taken on the same day, patient verbalized understanding.

## 2024-12-02 NOTE — Telephone Encounter (Signed)
 Signed physical script faxed to [facility], copy of orders and fax receipt given to RN covering for Dr. Wonda tomorrow 12/03/24 (CS, RN)

## 2024-12-03 ENCOUNTER — Other Ambulatory Visit: Payer: Self-pay | Admitting: Cardiology

## 2024-12-03 ENCOUNTER — Ambulatory Visit (HOSPITAL_COMMUNITY)
Admission: RE | Admit: 2024-12-03 | Discharge: 2024-12-03 | Disposition: A | Discharge status: Home / Self Care | Source: Ambulatory Visit | Attending: Cardiology | Admitting: Cardiology

## 2024-12-03 DIAGNOSIS — Z148 Genetic carrier of other disease: Secondary | ICD-10-CM | POA: Diagnosis not present

## 2024-12-03 MED ORDER — GADOBUTROL 1 MMOL/ML IV SOLN
10.0000 mL | Freq: Once | INTRAVENOUS | Status: AC | PRN
  Administered 2024-12-03: 10 mL via INTRAVENOUS

## 2024-12-05 ENCOUNTER — Inpatient Hospital Stay: Admission: RE | Admit: 2024-12-05 | Source: Ambulatory Visit

## 2024-12-06 ENCOUNTER — Telehealth (HOSPITAL_COMMUNITY): Payer: Self-pay

## 2024-12-06 NOTE — Telephone Encounter (Signed)
 Pt called and asked that you will pls call her about her meds

## 2024-12-13 NOTE — Telephone Encounter (Signed)
 Called # listed on DPR, appears to be out of service. Called 2nd # listed in chart to MIR results, no answer. No updated DPR on file for our practice. Left VM asking recipient to call [place] at our office #.

## 2024-12-13 NOTE — Telephone Encounter (Signed)
-----   Message from Nurse Roxie PARAS, RN sent at 12/13/2024  9:01 AM EST -----  ----- Message ----- From: Shlomo Wilbert SAUNDERS, MD Sent: 12/06/2024   6:34 PM EST To: [facility] [place] Triage  Cardiac 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  Last Echo 2023 showed EF 55-60%.   cMRI showed normal LV size and function, normal RV function, mild thickening of MV with trivial leakiness, no evidence of heart muscle scar or infiltrative disorder.  Trivial fluid in pericardial  sac.  She mentioned some atypical CP to Delon Hoover NP at last OV - please find out if she has had any further CP. ----- Message ----- From: Verlin Lonni BIRCH, MD Sent: 12/06/2024   9:28 AM EST To: Wilbert SAUNDERS Shlomo, MD; Delon JAYSON Hoover, NP  Pt of Dr. Shlomo. Will route to her. [place]

## 2024-12-13 NOTE — Telephone Encounter (Signed)
 Closing open encounter note.

## 2024-12-14 NOTE — Telephone Encounter (Signed)
 PT is returning call to nurse

## 2024-12-20 NOTE — Telephone Encounter (Signed)
-----   Message from Nurse Roxie PARAS, RN sent at 12/13/2024  9:01 AM EST -----  ----- Message ----- From: Shlomo Wilbert SAUNDERS, MD Sent: 12/06/2024   6:34 PM EST To: [facility] [place] Triage  Cardiac 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  Last Echo 2023 showed EF 55-60%.   cMRI showed normal LV size and function, normal RV function, mild thickening of MV with trivial leakiness, no evidence of heart muscle scar or infiltrative disorder.  Trivial fluid in pericardial  sac.  She mentioned some atypical CP to Delon Hoover NP at last OV - please find out if she has had any further CP. ----- Message ----- From: Verlin Lonni BIRCH, MD Sent: 12/06/2024   9:28 AM EST To: Wilbert SAUNDERS Shlomo, MD; Delon JAYSON Hoover, NP  Pt of Dr. Shlomo. Will route to her. [place]

## 2024-12-20 NOTE — Telephone Encounter (Signed)
 Call to patient to explain cardiac MRI results. Patient verbalizes understanding  that she had normal LV size and function, normal RV function, mild thickening of MV with trivial leakiness, no evidence of heart muscle scar or infiltrative disorder, as well as trivial fluid in pericardial  sac.  Asked patient if she has had any more chest pain since visit with Delon Hoover in mid November 2025. She states she has occasional discomfort in her chest a few times a month but it comes and goes and does not last long. Patient responses forwarded to Dr. Shlomo.

## 2024-12-21 ENCOUNTER — Telehealth (HOSPITAL_COMMUNITY): Admitting: Physician Assistant

## 2024-12-21 ENCOUNTER — Encounter (HOSPITAL_COMMUNITY): Payer: Self-pay | Admitting: Physician Assistant

## 2024-12-21 DIAGNOSIS — F411 Generalized anxiety disorder: Secondary | ICD-10-CM | POA: Diagnosis not present

## 2024-12-21 DIAGNOSIS — F330 Major depressive disorder, recurrent, mild: Secondary | ICD-10-CM | POA: Diagnosis not present

## 2024-12-21 DIAGNOSIS — F410 Panic disorder [episodic paroxysmal anxiety] without agoraphobia: Secondary | ICD-10-CM

## 2024-12-21 DIAGNOSIS — F4310 Post-traumatic stress disorder, unspecified: Secondary | ICD-10-CM

## 2024-12-21 MED ORDER — ALPRAZOLAM 0.25 MG PO TABS: 0.2500 mg | ORAL_TABLET | Freq: Two times a day (BID) | ORAL | 0 refills | Status: AC | PRN

## 2024-12-21 MED ORDER — BUSPIRONE HCL 15 MG PO TABS: 15.0000 mg | ORAL_TABLET | Freq: Two times a day (BID) | ORAL | 1 refills | Status: AC

## 2024-12-22 NOTE — Telephone Encounter (Signed)
 Call to patient to find out more about her chest pain. Patient states it occurs when she is exerting herself and when she is at rest. She says episodes last 10 mins or less. She says it happens a few times a week, never more than once a day. She says the pain occurs in her upper chest, on both the right and left side of her chest. She states it is occasionally accompanied by shortness of breath. Patient responses forwarded to Dr. Shlomo.

## 2024-12-22 NOTE — Telephone Encounter (Signed)
-----   Message from Wilbert Bihari, MD sent at 12/20/2024  9:09 PM EST ----- What does her CP feel like when she gets it ----- Message ----- From: Janit Geni CROME, RN Sent: 12/20/2024  10:09 AM EST To: Wilbert JONELLE Bihari, MD  ----- Message from Geni CROME Janit, RN sent at 12/20/2024 10:09 AM EST -----  Patient responses below ----- Message ----- From: Vicci Roxie CROME, RN Sent: 12/13/2024   9:01 AM EST To: Geni CROME Janit, RN   ----- Message ----- From: Bihari Wilbert JONELLE, MD Sent: 12/06/2024   6:34 PM EST To: [facility] [place] Triage  Cardiac 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  Last Echo 2023 showed EF 55-60%.   cMRI showed normal LV size and function, normal RV function, mild thickening of MV with trivial leakiness, no evidence of heart muscle scar or infiltrative disorder.  Trivial fluid in pericardial  sac.  She mentioned some atypical CP to Delon Hoover NP at last OV - please find out if she has had any further CP. ----- Message ----- From: Verlin Lonni BIRCH, MD Sent: 12/06/2024   9:28 AM EST To: Wilbert JONELLE Bihari, MD; Delon JAYSON Hoover, NP  Pt of Dr. Bihari. Will route to her. [place]

## 2024-12-27 ENCOUNTER — Ambulatory Visit: Admitting: Internal Medicine

## 2024-12-27 NOTE — Progress Notes (Deleted)
 "  Angela Branch, female    DOB: [birth year]   MRN: [id number]   Brief patient profile:  87 yobf   never smoker with asthma as child  referred to pulmonary clinic 01/15/2024 by Angela Branch  for cp       Ant L Cp x 10 years,  occurs most days lasts up to 10- 15 min    then started noting palpitations and resting sob but none of these occur at the same time or ever wake her from sleep and inhalers don't seem to help.    History of Present Illness  01/15/2024  Pulmonary/ 1st office eval/Angela Branch  Chief Complaint  Patient presents with   Consult    SOB and palpitations. Hx of asthma.  Sx x several years.  Patient is [redacted] weeks pregnant  Dyspnea:  only with exertion   Cough: none  Sleep: on side bed is flat  SABA use: rarely uses  Rec IBS diet/ citrucel    12/27/2024  f/u ov/Angela Branch re: Asthma./ IBS    maint on ***  No chief complaint on file.   Dyspnea:  *** Cough: *** Sleeping: *** resp cc  SABA use: *** 02: ***  Lung cancer screening :  ***    No obvious day to day or daytime variability or assoc excess/ purulent sputum or mucus plugs or hemoptysis or cp or chest tightness, subjective wheeze or overt sinus or hb symptoms.    Also denies any obvious fluctuation of symptoms with weather or environmental changes or other aggravating or alleviating factors except as outlined above   No unusual exposure hx or h/o childhood pna/ asthma or knowledge of premature birth.  Current Allergies, Complete Past Medical History, Past Surgical History, Family History, and Social History were reviewed in Angela Branch record.  ROS  The following are not active complaints unless bolded Hoarseness, sore throat, dysphagia, dental problems, itching, sneezing,  nasal congestion or discharge of excess mucus or purulent secretions, ear ache,   fever, chills, sweats, unintended wt loss or wt gain, classically pleuritic or exertional cp,  orthopnea pnd or arm/hand swelling  or leg  swelling, presyncope, palpitations, abdominal pain, anorexia, nausea, vomiting, diarrhea  or change in bowel habits or change in bladder habits, change in stools or change in urine, dysuria, hematuria,  rash, arthralgias, visual complaints, headache, numbness, weakness or ataxia or problems with walking or coordination,  change in mood or  memory.          Outpatient Medications Prior to Visit  Medication Sig Dispense Refill   albuterol  (VENTOLIN  HFA) 108 (90 Base) MCG/ACT inhaler Inhale 1 puff into the lungs every 6 (six) hours as needed (prn).     ALPRAZolam  (XANAX ) 0.25 MG tablet Take 1 tablet (0.25 mg total) by mouth 2 (two) times daily as needed for anxiety. 60 tablet 0   busPIRone  (BUSPAR ) 15 MG tablet Take 1 tablet (15 mg total) by mouth 2 (two) times daily. 60 tablet 1   clonazePAM  (KLONOPIN ) 0.5 MG tablet Take 0.5 tablets (0.25 mg total) by mouth 2 (two) times daily. 30 tablet 0   No facility-administered medications prior to visit.         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  Flu Vaccine   Genetic Screen  NIPS: low risk female      TDaP vaccine    Hgb A1C or  GTT  Third trimester  Rhogam  n/a   LAB RESULTS  Feeding Plan Breast/Bottle Blood Type A/Positive/-- (07/16 1206)  Contraception undecided Antibody Negative (07/16   Supervision of other normal pregnancy, antepartum 07/25/2021              Nursing Staff    Provider      Office Location     Angela Branch    Dating     Early (6wk) US       Language     English    Anatomy US      Suboptimal at 19w, rescan ordered      Flu Vaccine          Genetic/Carrier Screen     NIPS:   Low risks  AFP:     Horizon: negative       TDaP Vaccine      03/09/21    Hgb A1C or   GTT    Early A1C 5.0  Third trimester passed (79, 131, 102)      COVID Vacci   Thyroid  disease    Vaginal delivery 01/17/2022      Objective:    Wt Readings from Last 3 Encounters:  11/08/24 160 lb 3.2 oz (72.7 kg)  07/07/24 150 lb 3.2 oz (68.1 kg)  05/23/24 156 lb 8 oz (71 kg)      Vital signs reviewed  12/27/2024  - Note at rest 02 sats  ***% on ***   General appearance:    ***        Assessment             "

## 2025-01-03 NOTE — Telephone Encounter (Signed)
-----   Message from Wilbert Bihari, MD sent at 12/22/2024  7:56 PM EST ----- Is it worse when her asthma is acting up ----- Message ----- From: Janit Geni CROME, RN Sent: 12/22/2024   5:59 PM EST To: Wilbert JONELLE Bihari, MD  ----- Message from Geni CROME Janit, RN sent at 12/22/2024  5:59 PM EST -----   ----- Message ----- From: Bihari Wilbert JONELLE, MD Sent: 12/20/2024   9:09 PM EST To: Geni CROME Janit, RN  What does her CP feel like when she gets it ----- Message ----- From: Janit Geni CROME, RN Sent: 12/20/2024  10:09 AM EST To: Wilbert JONELLE Bihari, MD  ----- Message from Geni CROME Janit, RN sent at 12/20/2024 10:09 AM EST -----  Patient responses below ----- Message ----- From: Vicci Roxie CROME, RN Sent: 12/13/2024   9:01 AM EST To: Geni CROME Janit, RN   ----- Message ----- From: Bihari Wilbert JONELLE, MD Sent: 12/06/2024   6:34 PM EST To: [facility] [place] Triage  Cardiac 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  Last Echo 2023 showed EF 55-60%.   cMRI showed normal LV size and function, normal RV function, mild thickening of MV with trivial leakiness, no evidence of heart muscle scar or infiltrative disorder.  Trivial fluid in pericardial  sac.  She mentioned some atypical CP to Delon Hoover NP at last OV - please find out if she has had any further CP. ----- Message ----- From: Verlin Lonni BIRCH, MD Sent: 12/06/2024   9:28 AM EST To: Wilbert JONELLE Bihari, MD; Delon JAYSON Hoover, NP  Pt of Dr. Bihari. Will route to her. [place]

## 2025-01-03 NOTE — Telephone Encounter (Signed)
 Call to patient to discuss symptoms, no answer. No updated DPR on file for our practice. Left VM asking recipient to call [place] at our office #. [facility] also sent.

## 2025-02-01 ENCOUNTER — Telehealth (HOSPITAL_COMMUNITY): Admitting: Physician Assistant
